# Patient Record
Sex: Male | Born: 1965 | Race: White | Hispanic: No | Smoking: Never smoker
Health system: Southern US, Community
[De-identification: ages and names within clinical notes are randomized; demographics above are authoritative.]

## PROBLEM LIST (undated history)

## (undated) DIAGNOSIS — F419 Anxiety disorder, unspecified: Secondary | ICD-10-CM

## (undated) DIAGNOSIS — I4581 Long QT syndrome: Secondary | ICD-10-CM

## (undated) DIAGNOSIS — J019 Acute sinusitis, unspecified: Secondary | ICD-10-CM

## (undated) DIAGNOSIS — H6120 Impacted cerumen, unspecified ear: Secondary | ICD-10-CM

## (undated) DIAGNOSIS — F1910 Other psychoactive substance abuse, uncomplicated: Secondary | ICD-10-CM

## (undated) DIAGNOSIS — R253 Fasciculation: Secondary | ICD-10-CM

## (undated) DIAGNOSIS — R251 Tremor, unspecified: Secondary | ICD-10-CM

## (undated) DIAGNOSIS — Z950 Presence of cardiac pacemaker: Secondary | ICD-10-CM

## (undated) DIAGNOSIS — L0291 Cutaneous abscess, unspecified: Secondary | ICD-10-CM

## (undated) DIAGNOSIS — F4310 Post-traumatic stress disorder, unspecified: Secondary | ICD-10-CM

## (undated) DIAGNOSIS — F1120 Opioid dependence, uncomplicated: Secondary | ICD-10-CM

## (undated) HISTORY — DX: Post-traumatic stress disorder, unspecified: F43.10

## (undated) HISTORY — DX: Tremor, unspecified: R25.1

## (undated) HISTORY — DX: Fasciculation: R25.3

## (undated) HISTORY — DX: Acute sinusitis, unspecified: J01.90

## (undated) HISTORY — PX: HERNIA REPAIR: SHX51

## (undated) HISTORY — DX: Long QT syndrome: I45.81

## (undated) HISTORY — DX: Other psychoactive substance abuse, uncomplicated: F19.10

## (undated) HISTORY — DX: Anxiety disorder, unspecified: F41.9

## (undated) HISTORY — DX: Opioid dependence, uncomplicated: F11.20

## (undated) HISTORY — DX: Impacted cerumen, unspecified ear: H61.20

## (undated) HISTORY — PX: LIVER SURGERY: SHX698

## (undated) HISTORY — DX: Cutaneous abscess, unspecified: L02.91

## (undated) HISTORY — DX: Presence of cardiac pacemaker: Z95.0

---

## 2002-06-19 ENCOUNTER — Emergency Department (HOSPITAL_COMMUNITY): Admission: EM | Admit: 2002-06-19 | Discharge: 2002-06-19 | Payer: Self-pay | Admitting: Emergency Medicine

## 2003-11-12 ENCOUNTER — Emergency Department (HOSPITAL_COMMUNITY): Admission: EM | Admit: 2003-11-12 | Discharge: 2003-11-12 | Payer: Self-pay | Admitting: Emergency Medicine

## 2004-04-27 ENCOUNTER — Inpatient Hospital Stay (HOSPITAL_COMMUNITY): Admission: EM | Admit: 2004-04-27 | Discharge: 2004-04-30 | Payer: Self-pay | Admitting: Emergency Medicine

## 2005-04-18 ENCOUNTER — Emergency Department (HOSPITAL_COMMUNITY): Admission: EM | Admit: 2005-04-18 | Discharge: 2005-04-18 | Payer: Self-pay | Admitting: Emergency Medicine

## 2005-06-02 ENCOUNTER — Inpatient Hospital Stay (HOSPITAL_COMMUNITY): Admission: EM | Admit: 2005-06-02 | Discharge: 2005-06-05 | Payer: Self-pay | Admitting: *Deleted

## 2005-06-11 ENCOUNTER — Ambulatory Visit: Payer: Self-pay | Admitting: Family Medicine

## 2005-06-12 ENCOUNTER — Ambulatory Visit: Payer: Self-pay | Admitting: *Deleted

## 2005-07-01 ENCOUNTER — Ambulatory Visit: Payer: Self-pay | Admitting: Family Medicine

## 2005-07-02 ENCOUNTER — Ambulatory Visit (HOSPITAL_COMMUNITY): Admission: RE | Admit: 2005-07-02 | Discharge: 2005-07-02 | Payer: Self-pay | Admitting: Internal Medicine

## 2005-07-09 ENCOUNTER — Ambulatory Visit: Payer: Self-pay | Admitting: Family Medicine

## 2005-07-16 ENCOUNTER — Emergency Department (HOSPITAL_COMMUNITY): Admission: EM | Admit: 2005-07-16 | Discharge: 2005-07-16 | Payer: Self-pay | Admitting: Emergency Medicine

## 2005-08-20 ENCOUNTER — Ambulatory Visit: Payer: Self-pay | Admitting: Family Medicine

## 2005-09-16 ENCOUNTER — Ambulatory Visit: Payer: Self-pay | Admitting: Internal Medicine

## 2005-09-19 ENCOUNTER — Emergency Department (HOSPITAL_COMMUNITY): Admission: EM | Admit: 2005-09-19 | Discharge: 2005-09-19 | Payer: Self-pay | Admitting: Emergency Medicine

## 2005-09-25 ENCOUNTER — Emergency Department (HOSPITAL_COMMUNITY): Admission: EM | Admit: 2005-09-25 | Discharge: 2005-09-25 | Payer: Self-pay | Admitting: Emergency Medicine

## 2005-10-07 ENCOUNTER — Ambulatory Visit: Payer: Self-pay | Admitting: Family Medicine

## 2005-11-04 ENCOUNTER — Emergency Department (HOSPITAL_COMMUNITY): Admission: EM | Admit: 2005-11-04 | Discharge: 2005-11-04 | Payer: Self-pay | Admitting: Family Medicine

## 2005-12-15 ENCOUNTER — Ambulatory Visit: Payer: Self-pay | Admitting: Family Medicine

## 2005-12-17 ENCOUNTER — Emergency Department (HOSPITAL_COMMUNITY): Admission: EM | Admit: 2005-12-17 | Discharge: 2005-12-17 | Payer: Self-pay | Admitting: Family Medicine

## 2006-02-18 ENCOUNTER — Ambulatory Visit: Payer: Self-pay | Admitting: Family Medicine

## 2006-08-24 ENCOUNTER — Ambulatory Visit: Payer: Self-pay | Admitting: Internal Medicine

## 2006-09-02 ENCOUNTER — Emergency Department (HOSPITAL_COMMUNITY): Admission: EM | Admit: 2006-09-02 | Discharge: 2006-09-02 | Payer: Self-pay | Admitting: Emergency Medicine

## 2006-09-02 ENCOUNTER — Ambulatory Visit: Payer: Self-pay | Admitting: Vascular Surgery

## 2006-09-07 ENCOUNTER — Ambulatory Visit: Payer: Self-pay | Admitting: Internal Medicine

## 2006-09-08 ENCOUNTER — Ambulatory Visit: Payer: Self-pay | Admitting: Internal Medicine

## 2006-09-09 ENCOUNTER — Ambulatory Visit (HOSPITAL_COMMUNITY): Admission: RE | Admit: 2006-09-09 | Discharge: 2006-09-09 | Payer: Self-pay | Admitting: Internal Medicine

## 2006-09-21 ENCOUNTER — Ambulatory Visit: Payer: Self-pay | Admitting: Family Medicine

## 2006-09-28 ENCOUNTER — Ambulatory Visit: Payer: Self-pay | Admitting: Internal Medicine

## 2006-09-29 ENCOUNTER — Ambulatory Visit: Payer: Self-pay | Admitting: Family Medicine

## 2006-10-06 ENCOUNTER — Ambulatory Visit: Payer: Self-pay | Admitting: Internal Medicine

## 2006-10-14 ENCOUNTER — Ambulatory Visit: Payer: Self-pay | Admitting: Internal Medicine

## 2006-10-15 ENCOUNTER — Ambulatory Visit: Payer: Self-pay | Admitting: Family Medicine

## 2006-10-22 ENCOUNTER — Ambulatory Visit: Payer: Self-pay | Admitting: Internal Medicine

## 2006-10-27 ENCOUNTER — Ambulatory Visit: Payer: Self-pay | Admitting: Internal Medicine

## 2006-11-30 ENCOUNTER — Emergency Department (HOSPITAL_COMMUNITY): Admission: EM | Admit: 2006-11-30 | Discharge: 2006-11-30 | Payer: Self-pay | Admitting: Emergency Medicine

## 2006-12-09 ENCOUNTER — Encounter (INDEPENDENT_AMBULATORY_CARE_PROVIDER_SITE_OTHER): Payer: Self-pay | Admitting: *Deleted

## 2006-12-24 ENCOUNTER — Ambulatory Visit: Payer: Self-pay | Admitting: Family Medicine

## 2007-02-08 ENCOUNTER — Emergency Department (HOSPITAL_COMMUNITY): Admission: EM | Admit: 2007-02-08 | Discharge: 2007-02-08 | Payer: Self-pay | Admitting: Emergency Medicine

## 2007-02-09 ENCOUNTER — Inpatient Hospital Stay (HOSPITAL_COMMUNITY): Admission: EM | Admit: 2007-02-09 | Discharge: 2007-02-11 | Payer: Self-pay | Admitting: Emergency Medicine

## 2007-04-02 ENCOUNTER — Ambulatory Visit: Payer: Self-pay | Admitting: Family Medicine

## 2007-04-06 ENCOUNTER — Emergency Department (HOSPITAL_COMMUNITY): Admission: EM | Admit: 2007-04-06 | Discharge: 2007-04-06 | Payer: Self-pay | Admitting: *Deleted

## 2007-04-06 ENCOUNTER — Emergency Department (HOSPITAL_COMMUNITY): Admission: EM | Admit: 2007-04-06 | Discharge: 2007-04-06 | Payer: Self-pay | Admitting: Family Medicine

## 2007-04-08 ENCOUNTER — Emergency Department (HOSPITAL_COMMUNITY): Admission: EM | Admit: 2007-04-08 | Discharge: 2007-04-08 | Payer: Self-pay | Admitting: Emergency Medicine

## 2007-04-12 ENCOUNTER — Emergency Department (HOSPITAL_COMMUNITY): Admission: EM | Admit: 2007-04-12 | Discharge: 2007-04-12 | Payer: Self-pay | Admitting: Emergency Medicine

## 2007-05-11 ENCOUNTER — Ambulatory Visit: Payer: Self-pay | Admitting: Family Medicine

## 2007-06-11 ENCOUNTER — Ambulatory Visit: Payer: Self-pay | Admitting: Family Medicine

## 2007-06-12 ENCOUNTER — Emergency Department (HOSPITAL_COMMUNITY): Admission: EM | Admit: 2007-06-12 | Discharge: 2007-06-12 | Payer: Self-pay | Admitting: Family Medicine

## 2007-06-12 ENCOUNTER — Emergency Department (HOSPITAL_COMMUNITY): Admission: EM | Admit: 2007-06-12 | Discharge: 2007-06-12 | Payer: Self-pay | Admitting: Emergency Medicine

## 2007-07-15 ENCOUNTER — Ambulatory Visit: Payer: Self-pay | Admitting: Internal Medicine

## 2007-09-17 ENCOUNTER — Ambulatory Visit: Payer: Self-pay | Admitting: Family Medicine

## 2007-09-18 ENCOUNTER — Emergency Department (HOSPITAL_COMMUNITY): Admission: EM | Admit: 2007-09-18 | Discharge: 2007-09-18 | Payer: Self-pay | Admitting: Emergency Medicine

## 2007-10-01 ENCOUNTER — Emergency Department (HOSPITAL_COMMUNITY): Admission: EM | Admit: 2007-10-01 | Discharge: 2007-10-01 | Payer: Self-pay | Admitting: Emergency Medicine

## 2007-10-27 ENCOUNTER — Encounter (INDEPENDENT_AMBULATORY_CARE_PROVIDER_SITE_OTHER): Payer: Self-pay | Admitting: Family Medicine

## 2007-10-27 ENCOUNTER — Ambulatory Visit: Payer: Self-pay | Admitting: Family Medicine

## 2007-10-27 LAB — CONVERTED CEMR LAB
Albumin: 4.4 g/dL (ref 3.5–5.2)
Alkaline Phosphatase: 57 units/L (ref 39–117)
BUN: 10 mg/dL (ref 6–23)
Glucose, Bld: 91 mg/dL (ref 70–99)
HDL: 40 mg/dL (ref >39)
LDL Cholesterol: 126 mg/dL — ABNORMAL HIGH (ref 0–99)
Total Bilirubin: 0.7 mg/dL (ref 0.3–1.2)
Triglycerides: 97 mg/dL (ref <150)
VLDL: 19 mg/dL (ref 0–40)

## 2007-11-16 ENCOUNTER — Ambulatory Visit: Payer: Self-pay | Admitting: Internal Medicine

## 2008-01-10 IMAGING — CR DG ANKLE COMPLETE 3+V*L*
3 series · 3 of 3 positions shown · non-contrast
Comparison: none

CLINICAL DATA: Swelling ankle and foot for two days. 
 LEFT ANKLE - 3 VIEW ? 04/18/05:

[view not recorded (1 of 3)]
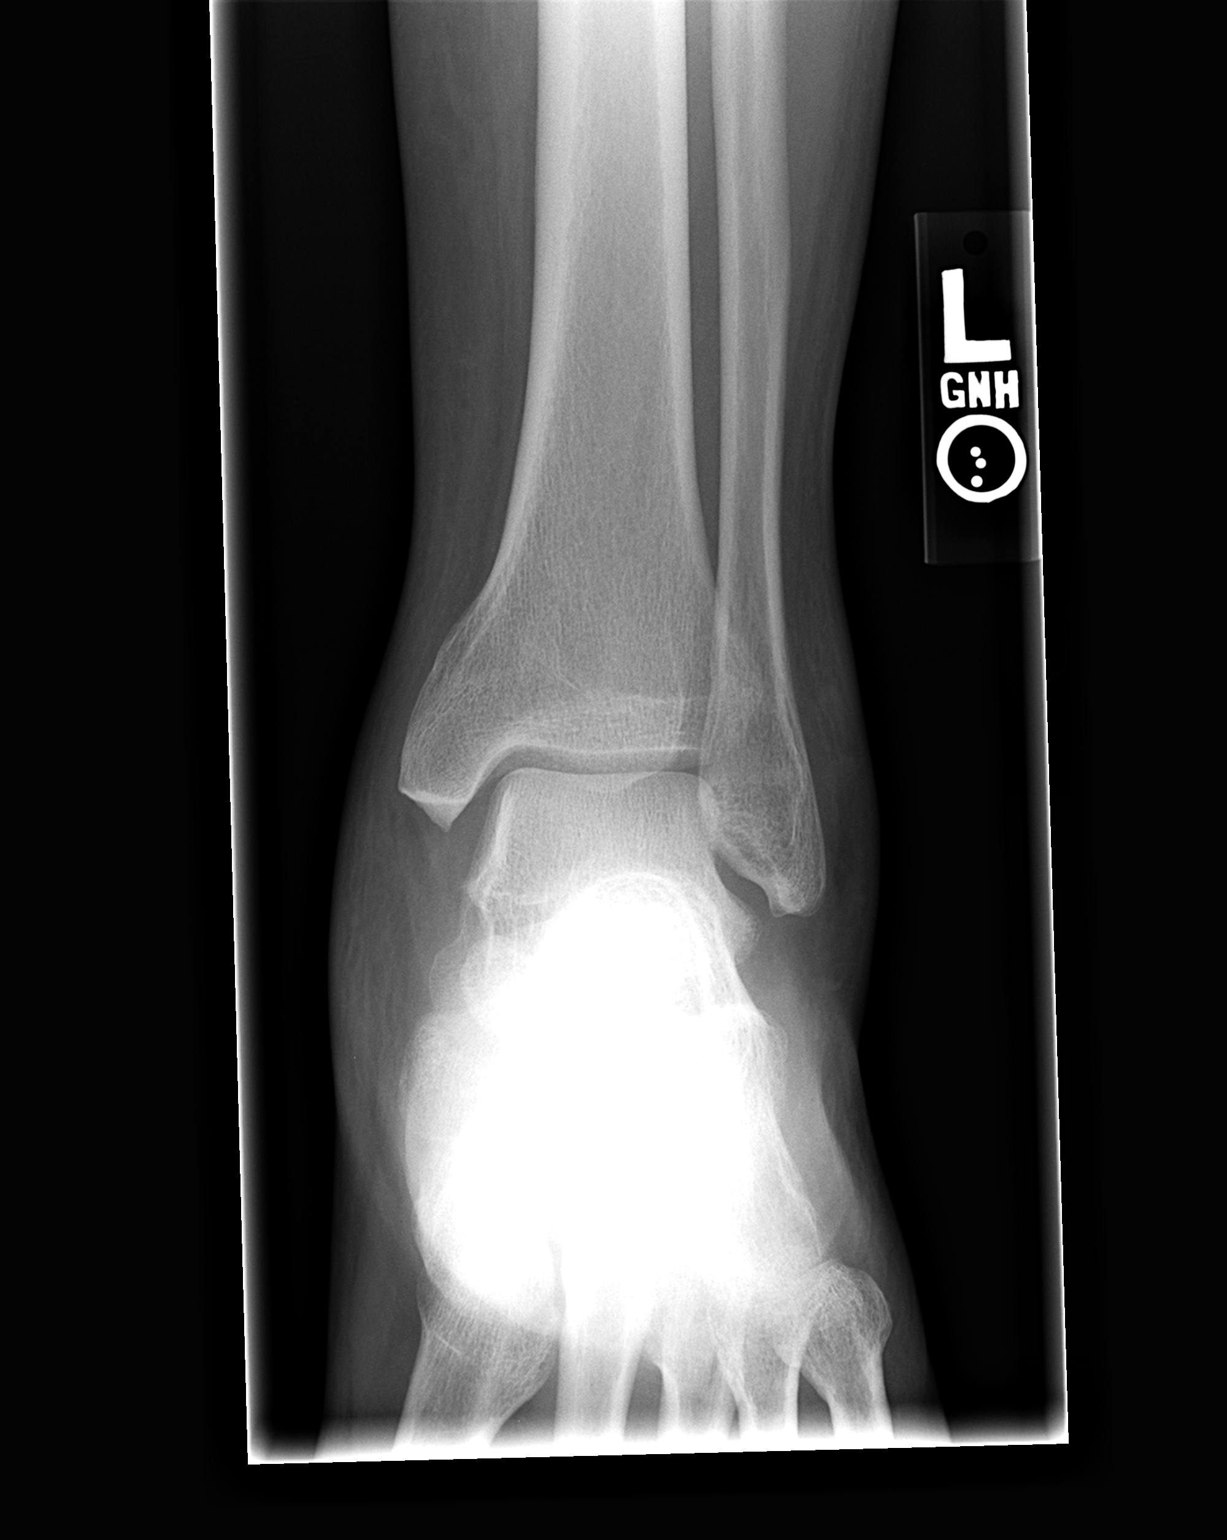

[view not recorded (2 of 3)]
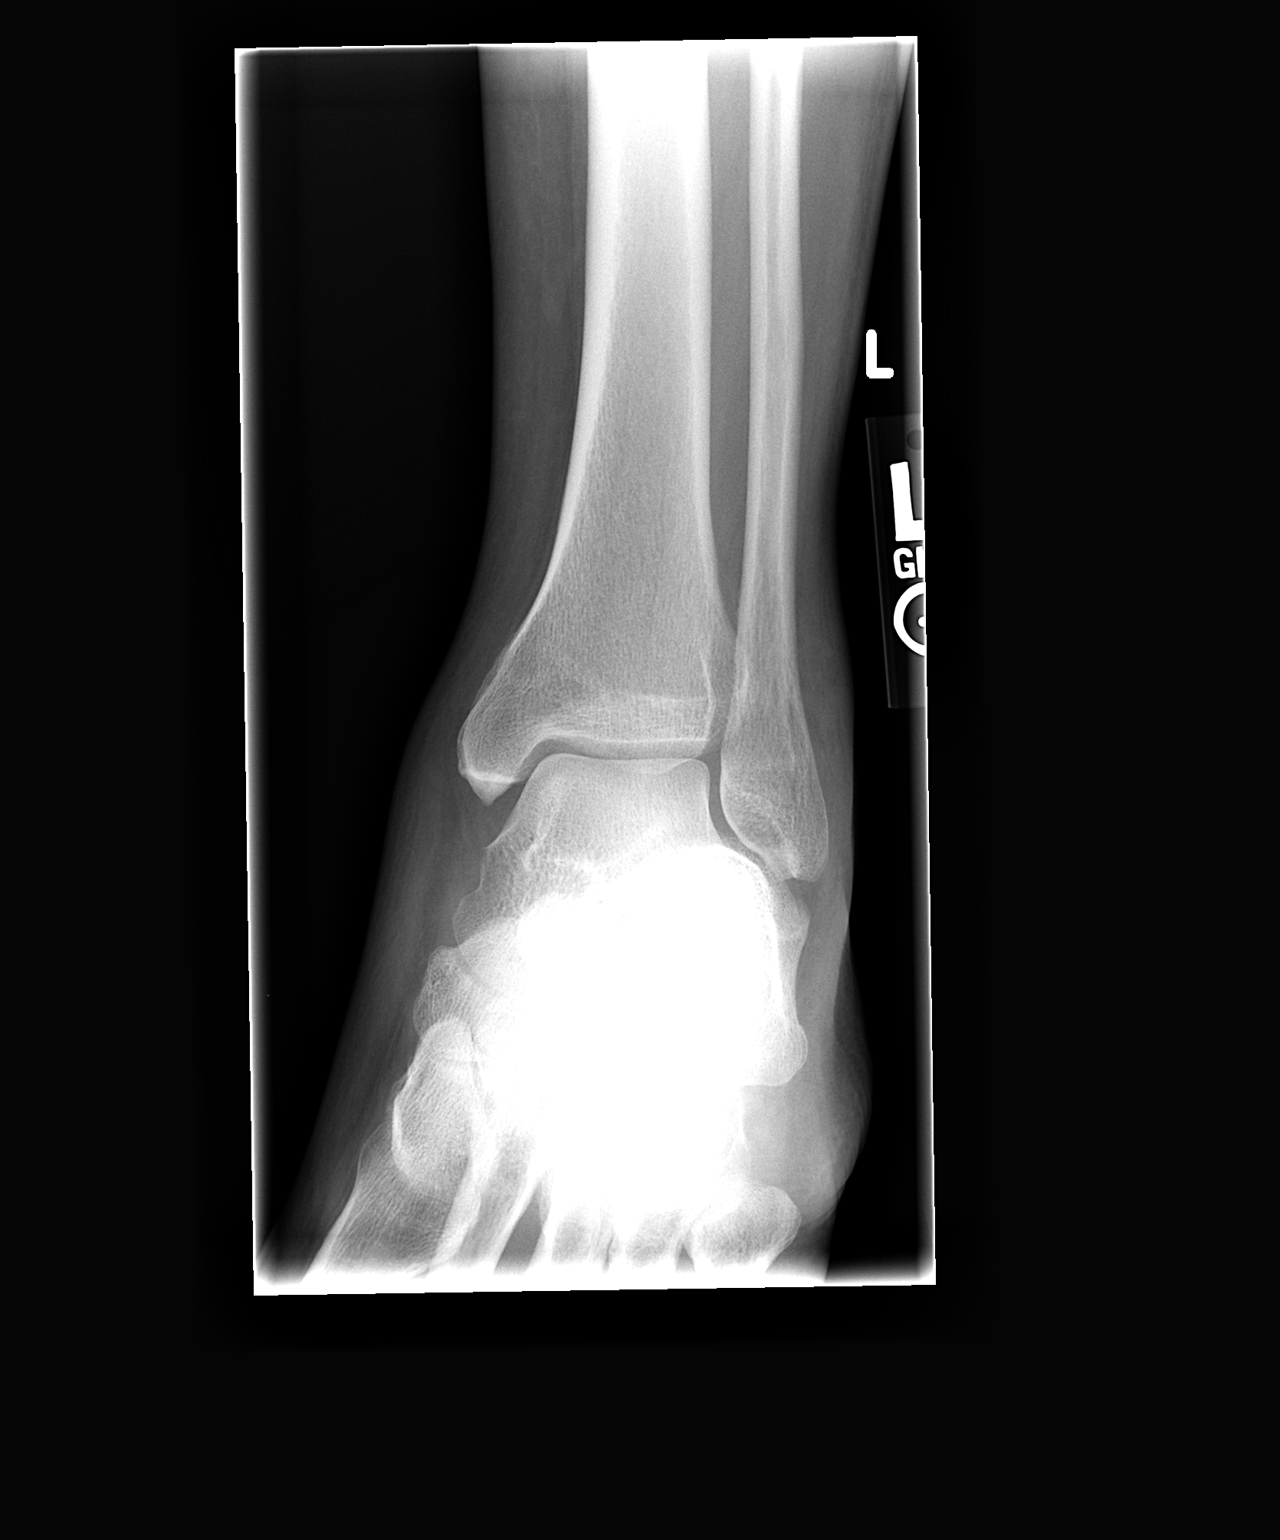

[view not recorded (3 of 3)]
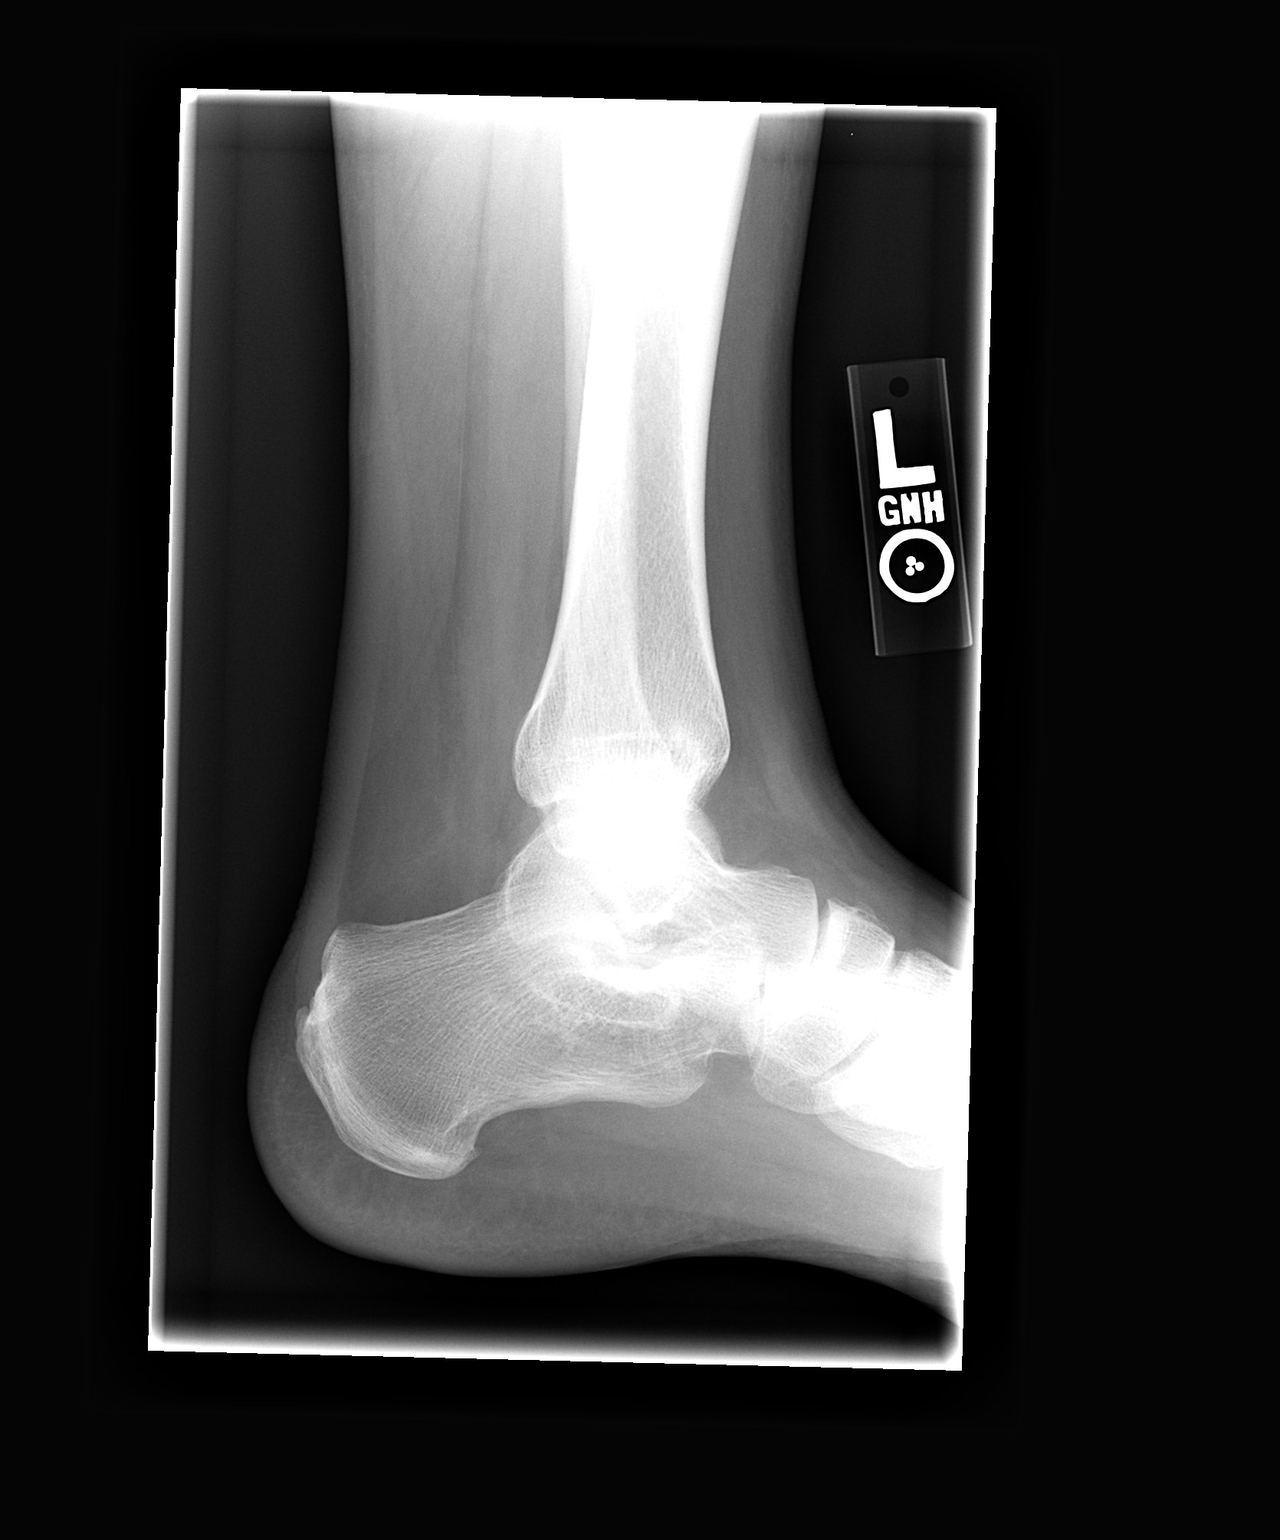

[3 of 3 positions shown; findings below may reference images not displayed]

FINDINGS: Diffuse ankle soft tissue swelling is seen.   The ankle mortise is symmetric with no fracture nor arthritis seen.   Tiny degenerative spurs are seen at the anterior talus and navicular and at the posterior and inferior calcaneus.
IMPRESSION: 1.   Nonspecific ankle soft tissue swelling.
 2.   Minimal degenerative osteophytes as described. 
 3.   Otherwise negative.

## 2008-01-12 ENCOUNTER — Ambulatory Visit: Payer: Self-pay | Admitting: Internal Medicine

## 2008-01-13 ENCOUNTER — Ambulatory Visit (HOSPITAL_COMMUNITY): Admission: RE | Admit: 2008-01-13 | Discharge: 2008-01-13 | Payer: Self-pay | Admitting: Internal Medicine

## 2008-06-06 ENCOUNTER — Ambulatory Visit: Payer: Self-pay | Admitting: Internal Medicine

## 2008-06-16 ENCOUNTER — Ambulatory Visit: Payer: Self-pay | Admitting: Internal Medicine

## 2008-06-16 ENCOUNTER — Encounter (INDEPENDENT_AMBULATORY_CARE_PROVIDER_SITE_OTHER): Payer: Self-pay | Admitting: Adult Health

## 2008-06-16 LAB — CONVERTED CEMR LAB
Alkaline Phosphatase: 70 units/L (ref 39–117)
BUN: 11 mg/dL (ref 6–23)
Basophils Absolute: 0 10*3/uL (ref 0.0–0.1)
Basophils Relative: 0 % (ref 0–1)
Creatinine, Ser: 1.13 mg/dL (ref 0.40–1.50)
Glucose, Bld: 74 mg/dL (ref 70–99)
HDL: 30 mg/dL — ABNORMAL LOW (ref >39)
Hemoglobin: 13.8 g/dL (ref 13.0–17.0)
LDL Cholesterol: 103 mg/dL — ABNORMAL HIGH (ref 0–99)
MCHC: 33 g/dL (ref 30.0–36.0)
Monocytes Absolute: 0.7 10*3/uL (ref 0.1–1.0)
Neutro Abs: 6 10*3/uL (ref 1.7–7.7)
RDW: 13.2 % (ref 11.5–15.5)
Sodium: 140 meq/L (ref 135–145)
Total Bilirubin: 0.6 mg/dL (ref 0.3–1.2)
Total CHOL/HDL Ratio: 5.7
Total Protein: 7.8 g/dL (ref 6.0–8.3)
Triglycerides: 189 mg/dL — ABNORMAL HIGH (ref <150)
VLDL: 38 mg/dL (ref 0–40)

## 2008-12-13 ENCOUNTER — Emergency Department (HOSPITAL_COMMUNITY): Admission: EM | Admit: 2008-12-13 | Discharge: 2008-12-13 | Payer: Self-pay | Admitting: Family Medicine

## 2008-12-13 ENCOUNTER — Ambulatory Visit: Payer: Self-pay | Admitting: Internal Medicine

## 2008-12-13 ENCOUNTER — Observation Stay (HOSPITAL_COMMUNITY): Admission: EM | Admit: 2008-12-13 | Discharge: 2008-12-16 | Payer: Self-pay | Admitting: Emergency Medicine

## 2008-12-13 ENCOUNTER — Ambulatory Visit: Payer: Self-pay | Admitting: Vascular Surgery

## 2008-12-13 ENCOUNTER — Encounter (INDEPENDENT_AMBULATORY_CARE_PROVIDER_SITE_OTHER): Payer: Self-pay | Admitting: Emergency Medicine

## 2008-12-14 ENCOUNTER — Encounter: Payer: Self-pay | Admitting: Internal Medicine

## 2009-06-06 ENCOUNTER — Ambulatory Visit: Payer: Self-pay | Admitting: Family Medicine

## 2009-06-06 ENCOUNTER — Encounter (INDEPENDENT_AMBULATORY_CARE_PROVIDER_SITE_OTHER): Payer: Self-pay | Admitting: Adult Health

## 2009-06-06 LAB — CONVERTED CEMR LAB
ALT: 28 units/L (ref 0–53)
AST: 28 units/L (ref 0–37)
BUN: 11 mg/dL (ref 6–23)
Basophils Relative: 0 % (ref 0–1)
Cholesterol: 191 mg/dL (ref 0–200)
Creatinine, Ser: 1.08 mg/dL (ref 0.40–1.50)
Eosinophils Absolute: 0 10*3/uL (ref 0.0–0.7)
Eosinophils Relative: 0 % (ref 0–5)
HDL: 29 mg/dL — ABNORMAL LOW (ref >39)
MCHC: 32.7 g/dL (ref 30.0–36.0)
MCV: 86.7 fL (ref 78.0–100.0)
Neutrophils Relative %: 70 % (ref 43–77)
Platelets: 229 10*3/uL (ref 150–400)
RDW: 13.2 % (ref 11.5–15.5)
Total Bilirubin: 0.7 mg/dL (ref 0.3–1.2)
Total CHOL/HDL Ratio: 6.6
VLDL: 36 mg/dL (ref 0–40)

## 2009-06-27 ENCOUNTER — Ambulatory Visit: Payer: Self-pay | Admitting: Internal Medicine

## 2009-09-16 ENCOUNTER — Emergency Department (HOSPITAL_COMMUNITY): Admission: EM | Admit: 2009-09-16 | Discharge: 2009-09-16 | Payer: Self-pay | Admitting: Family Medicine

## 2009-09-28 ENCOUNTER — Encounter (INDEPENDENT_AMBULATORY_CARE_PROVIDER_SITE_OTHER): Payer: Self-pay | Admitting: Adult Health

## 2009-09-28 ENCOUNTER — Ambulatory Visit: Payer: Self-pay | Admitting: Internal Medicine

## 2009-09-28 LAB — CONVERTED CEMR LAB
ALT: 24 units/L (ref 0–53)
AST: 27 units/L (ref 0–37)
Alkaline Phosphatase: 66 units/L (ref 39–117)
Amylase: 23 units/L (ref 0–105)
BUN: 16 mg/dL (ref 6–23)
Basophils Absolute: 0 10*3/uL (ref 0.0–0.1)
Basophils Relative: 0 % (ref 0–1)
Calcium: 8.8 mg/dL (ref 8.4–10.5)
Chloride: 102 meq/L (ref 96–112)
Creatinine, Ser: 1 mg/dL (ref 0.40–1.50)
Eosinophils Absolute: 0.1 10*3/uL (ref 0.0–0.7)
Eosinophils Relative: 1 % (ref 0–5)
HCT: 40 % (ref 39.0–52.0)
HDL: 32 mg/dL — ABNORMAL LOW (ref >39)
LDL Cholesterol: 92 mg/dL (ref 0–99)
Lipase: 10 units/L (ref 0–75)
Lymphs Abs: 2.1 10*3/uL (ref 0.7–4.0)
MCV: 88.3 fL (ref 78.0–100.0)
Neutrophils Relative %: 69 % (ref 43–77)
Platelets: 218 10*3/uL (ref 150–400)
RDW: 14.1 % (ref 11.5–15.5)
Total Bilirubin: 0.5 mg/dL (ref 0.3–1.2)
Total CHOL/HDL Ratio: 5.3
VLDL: 44 mg/dL — ABNORMAL HIGH (ref 0–40)
WBC: 8.4 10*3/uL (ref 4.0–10.5)

## 2009-10-15 ENCOUNTER — Ambulatory Visit: Payer: Self-pay | Admitting: Internal Medicine

## 2009-10-15 ENCOUNTER — Encounter (INDEPENDENT_AMBULATORY_CARE_PROVIDER_SITE_OTHER): Payer: Self-pay | Admitting: Adult Health

## 2009-10-17 ENCOUNTER — Ambulatory Visit (HOSPITAL_COMMUNITY): Admission: RE | Admit: 2009-10-17 | Discharge: 2009-10-17 | Payer: Self-pay | Admitting: Internal Medicine

## 2009-12-17 ENCOUNTER — Emergency Department (HOSPITAL_COMMUNITY): Admission: EM | Admit: 2009-12-17 | Discharge: 2009-12-17 | Payer: Self-pay | Admitting: Family Medicine

## 2009-12-24 ENCOUNTER — Emergency Department (HOSPITAL_COMMUNITY): Admission: EM | Admit: 2009-12-24 | Discharge: 2009-12-24 | Payer: Self-pay | Admitting: Emergency Medicine

## 2010-06-05 LAB — RAPID URINE DRUG SCREEN, HOSP PERFORMED
Amphetamines: NOT DETECTED
Opiates: NOT DETECTED
Tetrahydrocannabinol: NOT DETECTED

## 2010-06-11 IMAGING — CR DG CHEST 1V PORT
1 series · 1 of 1 positions shown · non-contrast
Comparison: 06/12/2007

CLINICAL DATA: Chest pain

PORTABLE CHEST - 1 VIEW

[AP]
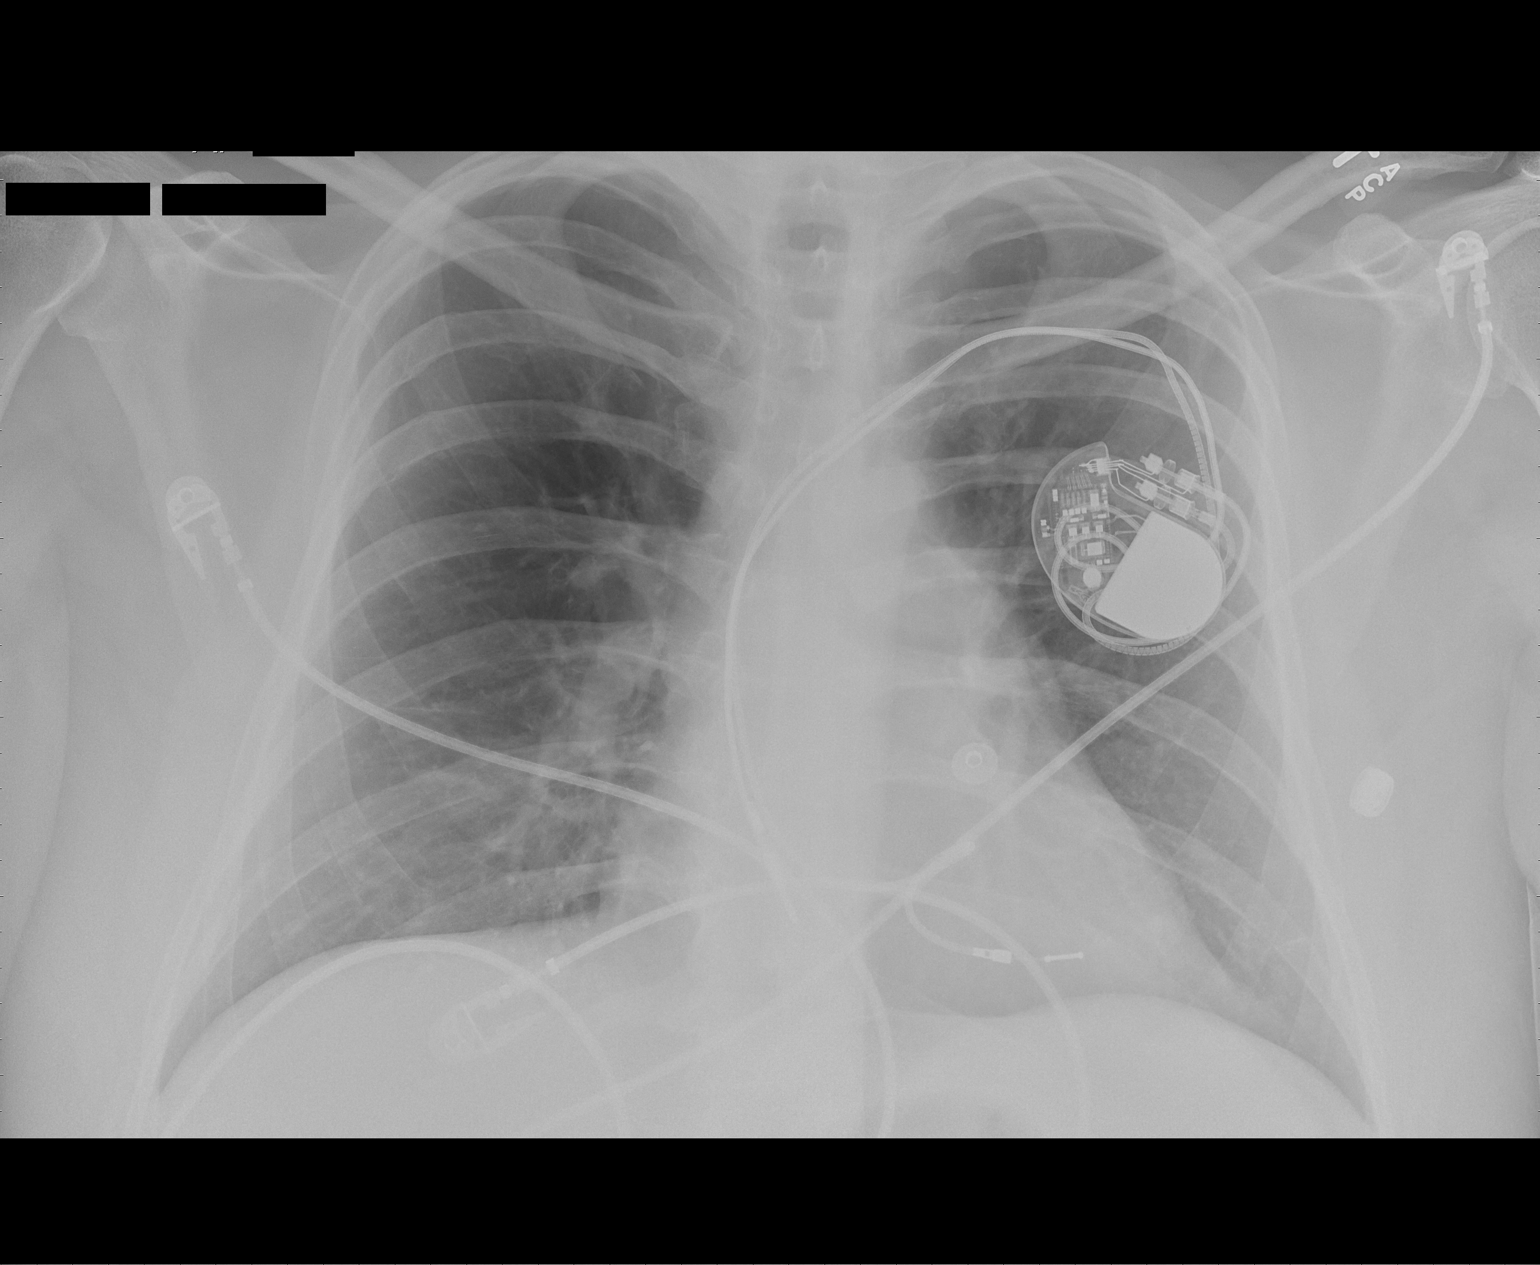

[1 of 1 positions shown; findings below may reference images not displayed]

FINDINGS: Left-sided pacer remains in place, unchanged.  Heart is
upper limits normal in size.  No focal opacities or effusions.  No
acute bony abnormality.
IMPRESSION: No acute findings.

## 2010-06-28 ENCOUNTER — Emergency Department: Payer: Self-pay | Admitting: Emergency Medicine

## 2010-06-28 LAB — COMPREHENSIVE METABOLIC PANEL
BUN: 8 mg/dL (ref 6–23)
CO2: 28 mEq/L (ref 19–32)
Calcium: 8.7 mg/dL (ref 8.4–10.5)
Creatinine, Ser: 1.02 mg/dL (ref 0.4–1.5)
GFR calc non Af Amer: >60 mL/min (ref >60)
Glucose, Bld: 113 mg/dL — ABNORMAL HIGH (ref 70–99)

## 2010-06-28 LAB — CBC
Hemoglobin: 12.7 g/dL — ABNORMAL LOW (ref 13.0–17.0)
MCHC: 33.9 g/dL (ref 30.0–36.0)
RBC: 4.32 MIL/uL (ref 4.22–5.81)

## 2010-06-28 LAB — BRAIN NATRIURETIC PEPTIDE: Pro B Natriuretic peptide (BNP): 39 pg/mL (ref 0.0–100.0)

## 2010-06-28 LAB — CARDIAC PANEL(CRET KIN+CKTOT+MB+TROPI)
CK, MB: 1.7 ng/mL (ref 0.3–4.0)
Relative Index: 1.2 (ref 0.0–2.5)
Total CK: 107 U/L (ref 7–232)
Troponin I: 0.01 ng/mL (ref 0.00–0.06)

## 2010-06-28 LAB — RAPID URINE DRUG SCREEN, HOSP PERFORMED
Barbiturates: NOT DETECTED
Benzodiazepines: NOT DETECTED
Benzodiazepines: NOT DETECTED
Cocaine: NOT DETECTED
Cocaine: NOT DETECTED
Opiates: NOT DETECTED
Opiates: NOT DETECTED

## 2010-06-28 LAB — TROPONIN I: Troponin I: 0.02 ng/mL (ref 0.00–0.06)

## 2010-06-28 LAB — CK TOTAL AND CKMB (NOT AT ARMC): Relative Index: INVALID (ref 0.0–2.5)

## 2010-06-28 LAB — IRON AND TIBC
Iron: 61 ug/dL (ref 42–135)
Saturation Ratios: 24 % (ref 20–55)
TIBC: 258 ug/dL (ref 215–435)
UIBC: 197 ug/dL

## 2010-06-28 LAB — HEPATIC FUNCTION PANEL
ALT: 29 U/L (ref 0–53)
AST: 28 U/L (ref 0–37)
Albumin: 3.4 g/dL — ABNORMAL LOW (ref 3.5–5.2)
Alkaline Phosphatase: 63 U/L (ref 39–117)
Total Bilirubin: 0.8 mg/dL (ref 0.3–1.2)

## 2010-06-28 LAB — PROTIME-INR
INR: 1 (ref 0.00–1.49)
Prothrombin Time: 12.6 seconds (ref 11.6–15.2)

## 2010-06-28 LAB — URINALYSIS, ROUTINE W REFLEX MICROSCOPIC
Glucose, UA: NEGATIVE mg/dL
Ketones, ur: NEGATIVE mg/dL
Specific Gravity, Urine: 1.024 (ref 1.005–1.030)
pH: 5 (ref 5.0–8.0)

## 2010-06-28 LAB — DIFFERENTIAL
Basophils Absolute: 0.1 10*3/uL (ref 0.0–0.1)
Eosinophils Absolute: 0.1 10*3/uL (ref 0.0–0.7)
Lymphocytes Relative: 23 % (ref 12–46)
Lymphs Abs: 1.8 10*3/uL (ref 0.7–4.0)
Neutrophils Relative %: 71 % (ref 43–77)

## 2010-06-28 LAB — CK: Total CK: 81 U/L (ref 7–232)

## 2010-06-28 LAB — TSH: TSH: 2.625 u[IU]/mL (ref 0.350–4.500)

## 2010-06-28 LAB — LIPID PANEL
Cholesterol: 156 mg/dL (ref 0–200)
HDL: 24 mg/dL — ABNORMAL LOW (ref >39)
Total CHOL/HDL Ratio: 6.5 RATIO

## 2010-06-28 LAB — RETICULOCYTES: Retic Count, Absolute: 87.8 10*3/uL (ref 19.0–186.0)

## 2010-06-28 LAB — HIV ANTIBODY (ROUTINE TESTING W REFLEX): HIV: NONREACTIVE

## 2010-06-28 LAB — POCT CARDIAC MARKERS

## 2010-06-28 LAB — SEDIMENTATION RATE: Sed Rate: 41 mm/hr — ABNORMAL HIGH (ref 0–16)

## 2010-06-28 LAB — FERRITIN: Ferritin: 280 ng/mL (ref 22–322)

## 2010-07-16 ENCOUNTER — Emergency Department (HOSPITAL_COMMUNITY)
Admission: EM | Admit: 2010-07-16 | Discharge: 2010-07-16 | Disposition: A | Discharge status: Home / Self Care | Payer: Self-pay | Attending: Emergency Medicine | Admitting: Emergency Medicine

## 2010-07-16 DIAGNOSIS — I10 Essential (primary) hypertension: Secondary | ICD-10-CM | POA: Insufficient documentation

## 2010-07-16 DIAGNOSIS — Z950 Presence of cardiac pacemaker: Secondary | ICD-10-CM | POA: Insufficient documentation

## 2010-07-16 DIAGNOSIS — F411 Generalized anxiety disorder: Secondary | ICD-10-CM | POA: Insufficient documentation

## 2010-08-06 NOTE — Consult Note (Signed)
Jeffrey [name]             ACCOUNT NO.:  [id number]   MEDICAL RECORD NO.:  [id number]          PATIENT TYPE:  INP   LOCATION:  1514                         FACILITY:  [facility]   PHYSICIAN:  Antonietta Breach, M.D.  DATE OF BIRTH:  [birth year]   DATE OF CONSULTATION:  02/10/2007  DATE OF DISCHARGE:                                 CONSULTATION   REASON FOR CONSULTATION:  Overdose.   REQUESTING PHYSICIAN:  [facility].   HISTORY OF PRESENT ILLNESS:  [name] is a 45 year old male  admitted to the [facility] on February 09, 2007, due to  complications of an overdose.   The patient had by report of family taken 90 tablets of Klonopin over 3  days.  This was based upon the bottle and the refill history.  The  patient has been a regular methadone client.  He has been placed on a  methadone program in a clinic downtown as a way to prevent his pattern  of engaging in illegal opiate dependent pattern of morphine sulfate and  Dilaudid.   The patient has very poor memory for the past 4 days.  He describes no  suicidal thoughts and denies any knowledge of trying to harm himself  with drug ingestion or otherwise.   He describes constructive future goals and interests.  He is now  oriented to all spheres.  He is cooperative with bedside care.  He has  no hallucinations or delusions.  He has no thoughts of harming himself  or others.  He is motivated to become abstinent from benzodiazepines, to  continue to maintain sobriety.  However, he does want to maintain the  methadone maintenance program because of its efficacy in controlling his  opioid dependent pattern.   Please see the discussion below regarding the patient's amnestic period.   PAST PSYCHIATRIC HISTORY:  The patient has not consumed alcohol in many  years.  He reports having been taking only 0.5 mg of Klonopin b.i.d. up  until the point of the 90 tablet overdose.  However, this average daily  use  cannot be confirmed.   The patient states that he has been in 14, substance dependent  rehabilitation programs.  She has not been on any antidepressants.   For general medical history, please see the attending physician's report  and progress notes.   MENTAL STATUS EXAM:  [name] is alert.  He is oriented to all  spheres.  He is socially appropriate and cooperative.  His memory is  intact to immediate recent and remote except for the amnestic period  over the past 4 days.  His speech is normal.  Thought process logical,  coherent, goal-directed.  No looseness of associations.  Thought content  with no thoughts of harming himself.  No thoughts of harming others no  delusions, no hallucinations.  Affect is slightly flat at baseline, but  with a broad appropriate response.  Judgment is intact.   PHYSICAL EXAMINATION:  The patient is not demonstrating any tremors or  sweats.   ASSESSMENT:  AXIS I:  1.  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  1. Opioid dependence.  2. Rule out (293.84) anxiety disorder, not otherwise specified.  3. (293.00) Delirium, not otherwise specified.  It appears that the      patient took Klonopin on top of his methadone initially in a      pattern that was excessive of that prescribed, but was sufficient      enough to place him into an anterograde amnesia.  At that point, he      apparently continued to take progressive amounts of Klonopin over 3      days, resulting in a potentially lethal overdose without conscious      intent.  The realization of this is helping motivate the patient      towards benzodiazepine abstinence and avoiding any other      medications that could synergize with his methadone in reducing his      delirium threshold.  AXIS II:  Deferred.  AXIS III:  See the primary care physician's general medical reports.  AXIS IV:  Primary support group.  AXIS V:  55.   Jeffrey [name] is  not at risk to harm himself or others.  He agrees to  call emergency services immediately for thoughts of harming himself,  thoughts of harming others or distress.   The undersigned provided ego supportive psychotherapy and education  reinforcing 12-step principles.   RECOMMENDATIONS:  1. The the patient does decline an inpatient chemical dependency      program, although the undersigned does recommend one.  He states      that he wants to get back to his outpatient 12-step approach and      ADS as well as his methadone maintenance program.  Would ask the      social worker to set this patient back up with [facility] as soon as      possible, once he is medically cleared.  2. Regarding his acute physiologic state, would use the CIWA      monitoring system (although traditionally for alcohol, this scale      will allow monitoring potential breakthrough benzodiazepine      withdrawal as well.  If the patient has any benzodiazepine      withdrawal, the Ativan is available p.r.n.  If the withdrawal      presents, I would start the Ativan detox protocol resulting in the      patient being discharged abstinent from benzodiazepines.      Antonietta Breach, M.D.  Electronically Signed     JW/MEDQ  D:  02/12/2007  T:  02/13/2007  Job:  [id number]

## 2010-08-06 NOTE — Discharge Summary (Signed)
Jeffrey [name], Jeffrey [name]             ACCOUNT NO.:  [id number]   MEDICAL RECORD NO.:  [id number]          PATIENT TYPE:  INP   LOCATION:  1514                         FACILITY:  [facility]   PHYSICIAN:  Elliot Cousin, M.D.    DATE OF BIRTH:  25-Oct-1965   DATE OF ADMISSION:  02/09/2007  DATE OF DISCHARGE:  02/11/2007                               DISCHARGE SUMMARY   DISCHARGE DIAGNOSES:  1. Delirium secondary to benzodiazepine (Klonopin) toxicity.  2. Antegrade amnesia secondary to Klonopin in combination with      methadone per the assessment of psychiatrist, Dr. Jeanie Sewer.  3. Opioid dependence, on chronic [facility].  4. Benzodiazepine abuse/addiction.  5. Hypertension.  6. 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  7. Hypokalemia.  8. Post traumatic stress disorder.   DISCHARGE MEDICATIONS:  1. Methadone 150 mg daily through the [facility]      program.  2. Klonopin 0.5 mg nightly or as directed by Dr. Emeline Darling and/or Dr.      Betti Cruz.  3. Atenolol 100 mg daily.  4. Lisinopril/hydrochlorothiazide 10/25 mg half a tablet daily for one      week and then back to one tablet daily or as directed.  5. Potassium chloride 20 mEq daily for seven more days.   DISCHARGE DISPOSITION:  The patient was discharged to home in improved  and stable condition on February 11, 2007.  He was advised to follow up  with [facility] daily as scheduled and Dr. Betti Cruz, his psychiatrist, in one  week.  The patient was also advised to follow up with his primary care  physician, Dr. Emeline Darling in one week.   PROCEDURE PERFORMED:  Chest x-ray on February 08, 2007.  The results  revealed no acute cardiopulmonary process.  Central bronchiectatic  changes, mild.   CONSULTATION:  Antonietta Breach, M.D.   HISTORY OF PRESENT ILLNESS:  The patient is a 45 year old man with a  past medical history significant for opioid dependence, post-traumatic  stress disorder,  chronic benzodiazepine treatment, and prolonged QT  interval.  He presented to the emergency department on February 09, 2007, after his nurse and/or family member found him to be confused and  delirious.  When Dr. Roxan Hockey evaluated the patient in the emergency  department, he was somewhat confused and unable to give a history that  made any sense.  However, per the conversation with the patient's  father, the patient had apparently taken 72 Klonopin tablets over the  past three days leading up to the hospital admission.  According to the  [facility] nurse, the patient had also taken 300 mg of methadone in one day  rather than 150 mg once daily for two days.  The patient was evaluated  by the ACT team in the emergency department and they apparently felt  that the patient needed medical clearance before  psychiatric inpatient  referral.  There was a question regarding whether or not the patient  attempted suicide.  However, the patient adamantly denied any thoughts  of suicide.  The patient  was therefore admitted for further evaluation  and management.   For additional details, please see the dictated history and physical.   HOSPITAL COURSE:  PROBLEM #1 - DELIRIUM AND ANTEGRADE AMNESIA SECONDARY  TO BENZODIAZEPINE TOXICITY/OVERDOSE:  At the time of the initial  assessment, the patient was hemodynamically stable and afebrile.  As  indicated above, he was somewhat confused and demonstrated mild  delirium.  His urine drug screen was positive for benzodiazepines only.  During the hospital course, he was monitored closely.  He remained  hemodynamically stable during the hospitalization.  The Klonopin was  discontinued during the first 24 hours of hospitalization, however,  because of a concern about a potential withdrawal syndrome, a small dose  of Klonopin was restarted at 0.5 mg b.i.d.  Subsequently, psychiatrist,  Dr. Jeanie Sewer, was consulted.  Per his assessment, the patient had  delirium and  antegrade amnesia secondary to Klonopin in combination with  methadone which led to a vicious cycle of craving and consumption  without memory of what the patient had consumed previously.  At the time  of Dr. Providence Crosby assessment, he felt that the patient was cleared for  discharge from a psychiatric standpoint.  He recommended discontinuing  Klonopin and starting the patient on as-needed Ativan.  However, the  patient indicated that he still had Klonopin at home and he vowed to  take only 0.5 mg nightly as prescribed previously by Dr. Emeline Darling.   Of note, the patient admitted that he received Klonopin from another  physician at a local urgent care even after Dr. Emeline Darling had prescribed 0.5  mg of Klonopin once a day.  The patient said that he began to crave more  benzodiazepines and therefore sought a prescription from an outside  physician.  Apparently he received the Klonopin and proceeded to take 90  Klonopin tablets over the three days leading up to the hospital  admission.  On the day of hospital discharge, the patient was completely  alert and oriented and without any sequelae from the Klonopin  intoxication.  It was felt that the patient was not suicidal and did not  take the Klonopin in an attempt to harm himself.   PROBLEM #2 - OPIOID DEPENDENCE (ON CHRONIC METHADONE):  The patient has  a remote history of heroin abuse.  He is being treated with methadone  150 mg daily via the [facility] program.  The patient acknowledged that he goes  to [facility] daily for methadone.  Dr. Betti Cruz is the psychiatrist who oversees  the methadone administration.  The patient received 75 mg of methadone  yesterday in the hospital and has received 150 mg today prior to  hospital discharge.  The registered nurse at [facility] was informed that the  patient received methadone today prior to hospital discharge.   PROBLEM #3 -  HYPERTENSION:  The patient's blood pressure remained  stable during the hospital course, but was on  the low end of normal.  He  is chronically treated with atenolol and lisinopril/hydrochlorothiazide.  The patient was advised to take only half of the  lisinopril/hydrochlorothiazide for approximately one week and then  restart one pill daily or as directed by Dr. Emeline Darling.  The patient was  instructed to maintain atenolol 100 mg daily.   PROBLEM #4 -  HISTORY OF PROLONGED QT INTERVAL STATUS POST PACEMAKER:  The patient was assessed with an EKG during the hospital course.  The  EKG revealed electronic atrial pacemaker, low voltage QRS, and a heart  rate  of 74 beats per minute.  There was no evidence of a prolonged QT  interval on the EKG.   PROBLEM #5 -  HYPOKALEMIA:  The patient's serum potassium ranged from  3.2-3.3 during the hospitalization.  He was started on supplementation.  His magnesium level was assessed and found to be within normal limits at  2.2.  The patient will be discharged to home on seven more days of  potassium chloride.      Elliot Cousin, M.D.  Electronically Signed     DF/MEDQ  D:  02/11/2007  T:  02/11/2007  Job:  [id number]   cc:   [facility] Dr. Myles Gip, M.D.  Fax: [phone]
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
## 2010-08-07 ENCOUNTER — Ambulatory Visit (HOSPITAL_COMMUNITY): Payer: Self-pay | Admitting: Psychiatry

## 2010-08-07 DIAGNOSIS — F411 Generalized anxiety disorder: Secondary | ICD-10-CM
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
## 2010-08-09 NOTE — Cardiovascular Report (Signed)
[name], Jeffrey [name]             ACCOUNT NO.:  [id number]   MEDICAL RECORD NO.:  [id number]          PATIENT TYPE:  OUT   LOCATION:  CATH                         FACILITY:  [facility]   PHYSICIAN:  Darlin Priestly, MD  DATE OF BIRTH:  1965-08-26   DATE OF PROCEDURE:  04/30/2004  DATE OF DISCHARGE:                              CARDIAC CATHETERIZATION   PROCEDURE:  1.  Left heart catheterization.  2.  Coronary angiography.  3.  Left ventriculogram.  4.  Abdominal aortogram.   INDICATIONS:  [name] is a 45 year old male, patient of Dr. Pearletha Furl.  Alanda Amass, with a history of long QT syndrome status post permanent  pacemaker implant at [facility].  By report, he had cardiac catheterization  at that time which revealed no significant CAD.  He was recently admitted on  April 27, 2004 with substernal chest pain and subsequently ruled out for  myocardial infarction.  He is now brought for cardiac catheterization to  rule out significant CAD.   DESCRIPTION OF PROCEDURE:  After informed consent, the patient was brought  to the cardiac catheterization lab.  Right and left groin were shaved,  prepped, and draped in the usual sterile fashion.   Under fluoroscopic visualization, it appears that the RV lead has a loop in  the right ventricle; however, there does not appear to be any kink or  evidence of lead fracture.   A 6-French diagnostic catheter was used to perform diagnostic angiography.   The left main is a large vessel with no significant disease.  The LAD is  large, coursed to the apex.  There is one large diagonal branch.  There is  no significant disease in the LAD.   The first diagonal is a large vessel with no significant disease.  The left  circumflex is a large vessel that coursed into the AV groove and progressed  to one obtuse marginal branch.  The AV groove to the circumflex has no  significant disease.   First OM was a large vessel that bifurcated to midsegment  with no  significant disease.  The right coronary artery was a large vessel that was  dominant.  It gave rise to the PDA and posterolateral branch.  There was no  significant disease in the RCA, posterolateral branch.   Left ventriculogram reveals moderate EF of 45-50% with mild anterior  hypokinesis.  Abdominal aortogram reveals a normal renal artery stenosis.   HEMODYNAMIC DATA:  1.  Systemic arterial pressure 139/89.  2.  LV systemic pressure 147/11.  3.  LVDP of 22.   CONCLUSION:  1.  No significant coronary artery disease.  2.  Mildly depressed left ventricular systolic function.  3.  No evidence of renal artery stenosis.  4.  Systemic hypertension.  5.  Elevated left ventricular diastolic pressure.      RHM/MEDQ  D:  04/30/2004  T:  04/30/2004  Job:  [id number]   cc:   Gerlene Burdock A. Alanda Amass, M.D.  [phone] [address], [address]  [place]  [place] [address]  Fax: [phone]

## 2010-08-09 NOTE — Discharge Summary (Signed)
Jeffrey [name], Jeffrey [name]             ACCOUNT NO.:  [id number]   MEDICAL RECORD NO.:  [id number]          PATIENT TYPE:  INP   LOCATION:  1412                         FACILITY:  [facility]   PHYSICIAN:  Hollice Espy, M.D.DATE OF BIRTH:  [birth year]   DATE OF ADMISSION:  06/02/2005  DATE OF DISCHARGE:  06/05/2005                                 DISCHARGE SUMMARY   ADDENDUM:  The patient's medications that he will be discharged on, he will  continue all of his same previous medications. These are:   1.  Aspirin 81 p.o. daily.  2.  Restoril 30 p.o. q.h.s. p.r.n.  3.  Atenolol 100 mg p.o. daily.  4.  Klonopin 1.5 p.o. b.i.d.  5.  Methadone 200 mg p.o. daily which he is receiving through a methadone      clinic. The patient has been quite compliant with his methadone doses      and when asking for prescriptions for his medications to go home on told      me that the only medications were his blood pressure as he already      receives his methadone at a methadone clinic and does not need this. I      feel that he is quite compliant with his methadone and did not show any      type of drug seeking behavior.      Hollice Espy, M.D.  Electronically Signed     SKK/MEDQ  D:  06/05/2005  T:  06/06/2005  Job:  [id number]   cc:   [facility]

## 2010-08-09 NOTE — Discharge Summary (Signed)
[name], [name]             ACCOUNT NO.:  [id number]   MEDICAL RECORD NO.:  [id number]          PATIENT TYPE:  INP   LOCATION:  1412                         FACILITY:  [facility]   PHYSICIAN:  Hollice Espy, M.D.DATE OF BIRTH:  1965-07-12   DATE OF ADMISSION:  06/02/2005  DATE OF DISCHARGE:  06/05/2005                                 DISCHARGE SUMMARY   Patient has no PCP but is being referred to [facility].   DISCHARGE DIET:  Heart-healthy diet.   ACTIVITY:  As tolerated.   CONDITION:  Improved, discharged to home.   DISCHARGE DIAGNOSES:  1.  Chest pain, rule out myocardial infarction.  No evidence of acute      cardiac ischemia.  2.  Left leg swelling, negative.  Evidence for deep venous thrombosis.  No      evidence of cellulitis.  3.  Previous history of opioid abuse.  4.  Hypertension.  5.  History of pacemaker secondary to prolonged Q-T interval.  6.  Slightly decreased ejection fraction and history of elevated left      ventricular diastolic pressure.   HISTORY OF PRESENT ILLNESS:  Patient is a pleasant 45 year old white male  who presented with substernal chest pain x24 hours, radiating down his left  arm.  The patient presented for evaluation.  He had previously had reported  complaints of chest pain and had a previous stress test at [facility].  In  addition, the patient was noticing a painful, swollen right leg, and there  was concern about possible myocardial ischemia as well as a possible DVT.   The patient was admitted to rule out patient's chest pain.  Old records were  eventually able to be obtained showing the patient had a cardiac  catheterization done in February, 2006, which was found to have no  significant CAD plus an EF of 40-45% as well as a normal stress test done at  [facility] one year prior, which was found to be negative.  At this point, the  patient's cardiac enzymes remained stable.  EKG was noted to be slightly  stable as well.  He had a  slightly elevated BNP of 191.  Following  administration of oxygen and a mild dose of Lasix, patient's breathing  improved.  In addition, in regard to the patient's right leg swelling, there  is concern about DVT.  Doppler studies were ordered.  Initial Doppler  reports were not put on chart, and when calls were placed to vascular lab,  phone reports initially reported ? DVT; however, this was followed up soon  after with a corrected report of no evidence of DVT.  Following no evidence  of DVT, the patient was thought to be medically stable, and the plan was  made for discharge.   History of drug use:  The patient has been in a methadone clinic, and this  has remained stable.   In regards to his hypertension, he is continued on his atenolol.  Patient is  being given a referral to [facility], and the plan will be to  follow up there.  Hollice Espy, M.D.  Electronically Signed     SKK/MEDQ  D:  06/05/2005  T:  06/06/2005  Job:  [id number]   cc:   [facility]
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
## 2010-08-23 ENCOUNTER — Encounter (HOSPITAL_COMMUNITY): Payer: Self-pay | Admitting: Psychiatry

## 2010-08-28 ENCOUNTER — Ambulatory Visit (HOSPITAL_COMMUNITY): Payer: Self-pay | Admitting: Marriage and Family Therapist

## 2010-09-18 ENCOUNTER — Emergency Department: Payer: Self-pay | Admitting: Internal Medicine

## 2010-11-22 ENCOUNTER — Ambulatory Visit (HOSPITAL_COMMUNITY): Payer: Self-pay | Admitting: Psychiatry

## 2010-12-12 LAB — CBC
HCT: 35.5 — ABNORMAL LOW
Hemoglobin: 12.3 — ABNORMAL LOW
MCHC: 34.6
MCV: 84.2
Platelets: 235
Platelets: 236
RBC: 4.22
RDW: 12.7
RDW: 13
WBC: 8.2

## 2010-12-12 LAB — DIFFERENTIAL
Basophils Absolute: 0
Basophils Relative: 0
Eosinophils Absolute: 0.1
Eosinophils Relative: 0
Lymphocytes Relative: 16
Lymphs Abs: 2
Neutro Abs: 6.2
Neutrophils Relative %: 69
Neutrophils Relative %: 79 — ABNORMAL HIGH

## 2010-12-12 LAB — POCT CARDIAC MARKERS
CKMB, poc: 2.3
Myoglobin, poc: 51.9
Myoglobin, poc: 76.8
Myoglobin, poc: 77.3
Operator id: 4295
Troponin i, poc: <0.05

## 2010-12-12 LAB — BASIC METABOLIC PANEL
BUN: 4 — ABNORMAL LOW
BUN: 5 — ABNORMAL LOW
Calcium: 8.5
Chloride: 102
Creatinine, Ser: 0.92
GFR calc non Af Amer: >60
Glucose, Bld: 125 — ABNORMAL HIGH

## 2010-12-12 LAB — ETHANOL: Alcohol, Ethyl (B): <5

## 2010-12-12 LAB — RAPID URINE DRUG SCREEN, HOSP PERFORMED
Amphetamines: NOT DETECTED
Cocaine: NOT DETECTED
Opiates: POSITIVE — AB
Tetrahydrocannabinol: NOT DETECTED

## 2010-12-16 LAB — DIFFERENTIAL
Basophils Relative: 1
Eosinophils Absolute: 0.1
Monocytes Relative: 5
Neutrophils Relative %: 64

## 2010-12-16 LAB — POCT I-STAT CREATININE: Creatinine, Ser: 1.3

## 2010-12-16 LAB — I-STAT 8, (EC8 V) (CONVERTED LAB)
Bicarbonate: 33 — ABNORMAL HIGH
Glucose, Bld: 99
Hemoglobin: 14.3
Sodium: 142
TCO2: 35
pH, Ven: 7.355 — ABNORMAL HIGH

## 2010-12-16 LAB — POCT CARDIAC MARKERS
CKMB, poc: <1 — ABNORMAL LOW
Myoglobin, poc: 56.9
Operator id: 196461
Troponin i, poc: <0.05

## 2010-12-16 LAB — RAPID URINE DRUG SCREEN, HOSP PERFORMED
Barbiturates: NOT DETECTED
Benzodiazepines: POSITIVE — AB
Cocaine: NOT DETECTED
Opiates: POSITIVE — AB

## 2010-12-16 LAB — CBC
MCHC: 33.6
MCV: 84.2
RBC: 4.83

## 2010-12-19 LAB — COMPREHENSIVE METABOLIC PANEL
AST: 49 — ABNORMAL HIGH
Albumin: 3.9
BUN: 10
Calcium: 8.8
Chloride: 105
Creatinine, Ser: 1.02
GFR calc Af Amer: >60
Total Bilirubin: 1.3 — ABNORMAL HIGH
Total Protein: 7

## 2010-12-19 LAB — POCT CARDIAC MARKERS
CKMB, poc: 2
Operator id: 133351
Operator id: 133351
Troponin i, poc: <0.05

## 2010-12-19 LAB — CBC
Platelets: 240
WBC: 8.7

## 2010-12-19 LAB — URINALYSIS, ROUTINE W REFLEX MICROSCOPIC
Ketones, ur: 15 — AB
Leukocytes, UA: NEGATIVE
Nitrite: NEGATIVE
Specific Gravity, Urine: 1.028
Urobilinogen, UA: 2 — ABNORMAL HIGH
pH: 5.5

## 2010-12-19 LAB — POCT I-STAT, CHEM 8
Calcium, Ion: 1.05 — ABNORMAL LOW
HCT: 38 — ABNORMAL LOW
Hemoglobin: 12.9 — ABNORMAL LOW
TCO2: 28

## 2010-12-19 LAB — PROTIME-INR: Prothrombin Time: 13

## 2010-12-19 LAB — ETHANOL: Alcohol, Ethyl (B): <5

## 2010-12-19 LAB — APTT: aPTT: 31

## 2010-12-19 LAB — RAPID URINE DRUG SCREEN, HOSP PERFORMED
Benzodiazepines: POSITIVE — AB
Tetrahydrocannabinol: NOT DETECTED

## 2010-12-19 LAB — URINE MICROSCOPIC-ADD ON

## 2010-12-31 LAB — COMPREHENSIVE METABOLIC PANEL
Albumin: 3 — ABNORMAL LOW
BUN: 4 — ABNORMAL LOW
Calcium: 7.9 — ABNORMAL LOW
Chloride: 106
Creatinine, Ser: 1.07
GFR calc non Af Amer: >60
Total Bilirubin: 0.7

## 2010-12-31 LAB — POCT I-STAT CREATININE
Creatinine, Ser: 1.2
Operator id: 198171

## 2010-12-31 LAB — CK TOTAL AND CKMB (NOT AT ARMC)
CK, MB: 1.6
CK, MB: 2
CK, MB: 2.4
Relative Index: 1.2
Total CK: 147
Total CK: 171
Total CK: 206

## 2010-12-31 LAB — I-STAT 8, (EC8 V) (CONVERTED LAB)
Acid-Base Excess: 3 — ABNORMAL HIGH
BUN: 6
Bicarbonate: 29.1 — ABNORMAL HIGH
Chloride: 102
Glucose, Bld: 99
HCT: 36 — ABNORMAL LOW
Hemoglobin: 12.2 — ABNORMAL LOW
Operator id: 198171
Potassium: 3.6
Sodium: 139
TCO2: 31
pCO2, Ven: 47.7
pH, Ven: 7.394 — ABNORMAL HIGH

## 2010-12-31 LAB — DIFFERENTIAL
Lymphocytes Relative: 21
Lymphs Abs: 1.5
Monocytes Relative: 5
Neutro Abs: 5.2
Neutrophils Relative %: 74

## 2010-12-31 LAB — POCT CARDIAC MARKERS
CKMB, poc: 15.3
CKMB, poc: 7.7
CKMB, poc: 9.5
Myoglobin, poc: 154
Myoglobin, poc: 171
Myoglobin, poc: 188
Operator id: 198171
Operator id: 198171
Operator id: 198171
Troponin i, poc: <0.05
Troponin i, poc: <0.05
Troponin i, poc: <0.05

## 2010-12-31 LAB — RAPID URINE DRUG SCREEN, HOSP PERFORMED
Amphetamines: NOT DETECTED
Amphetamines: NOT DETECTED
Barbiturates: NOT DETECTED
Benzodiazepines: POSITIVE — AB
Benzodiazepines: POSITIVE — AB
Cocaine: NOT DETECTED
Opiates: NOT DETECTED
Opiates: NOT DETECTED
Tetrahydrocannabinol: NOT DETECTED

## 2010-12-31 LAB — CBC
HCT: 31 — ABNORMAL LOW
MCHC: 35.3
MCV: 85.2
Platelets: 173
Platelets: 199
RBC: 4.17 — ABNORMAL LOW
WBC: 5.2
WBC: 7.1

## 2010-12-31 LAB — TROPONIN I
Troponin I: 0.01
Troponin I: 0.01
Troponin I: 0.01

## 2010-12-31 LAB — ETHANOL: Alcohol, Ethyl (B): <5

## 2010-12-31 LAB — PHOSPHORUS: Phosphorus: 3.6

## 2010-12-31 LAB — BASIC METABOLIC PANEL
BUN: 6
Chloride: 105
Creatinine, Ser: 1.08
GFR calc Af Amer: >60
GFR calc Af Amer: >60
GFR calc non Af Amer: >60
Potassium: 3.3 — ABNORMAL LOW

## 2011-01-03 LAB — I-STAT 8, (EC8 V) (CONVERTED LAB)
Acid-Base Excess: 2
BUN: 12
Chloride: 105
HCT: 37 — ABNORMAL LOW
Operator id: 285841
Potassium: 3.5
pH, Ven: 7.341 — ABNORMAL HIGH

## 2011-01-03 LAB — POCT CARDIAC MARKERS
Myoglobin, poc: 122
Operator id: 285841

## 2011-01-03 LAB — POCT I-STAT CREATININE: Operator id: 285841

## 2011-01-09 LAB — DIFFERENTIAL
Lymphocytes Relative: 26
Neutro Abs: 4.2
Neutrophils Relative %: 66

## 2011-01-09 LAB — PROTIME-INR: INR: 1

## 2011-01-09 LAB — BASIC METABOLIC PANEL
BUN: 6
Calcium: 8.2 — ABNORMAL LOW
GFR calc non Af Amer: >60
Glucose, Bld: 85

## 2011-01-09 LAB — POCT CARDIAC MARKERS
CKMB, poc: 3.4
Myoglobin, poc: 90.3
Troponin i, poc: <0.05

## 2011-01-09 LAB — CBC
Platelets: 205
RDW: 13.8

## 2011-01-22 ENCOUNTER — Emergency Department (HOSPITAL_COMMUNITY): Payer: Self-pay

## 2011-01-22 ENCOUNTER — Emergency Department (HOSPITAL_COMMUNITY)
Admission: EM | Admit: 2011-01-22 | Discharge: 2011-01-22 | Disposition: A | Discharge status: Home / Self Care | Payer: Self-pay | Attending: Emergency Medicine | Admitting: Emergency Medicine

## 2011-01-22 DIAGNOSIS — R51 Headache: Secondary | ICD-10-CM | POA: Insufficient documentation

## 2011-01-22 DIAGNOSIS — R42 Dizziness and giddiness: Secondary | ICD-10-CM | POA: Insufficient documentation

## 2011-01-22 DIAGNOSIS — R112 Nausea with vomiting, unspecified: Secondary | ICD-10-CM | POA: Insufficient documentation

## 2011-01-22 DIAGNOSIS — Z950 Presence of cardiac pacemaker: Secondary | ICD-10-CM | POA: Insufficient documentation

## 2011-01-22 DIAGNOSIS — I10 Essential (primary) hypertension: Secondary | ICD-10-CM | POA: Insufficient documentation

## 2011-01-22 DIAGNOSIS — R55 Syncope and collapse: Secondary | ICD-10-CM | POA: Insufficient documentation

## 2014-12-19 ENCOUNTER — Ambulatory Visit (INDEPENDENT_AMBULATORY_CARE_PROVIDER_SITE_OTHER): Payer: Self-pay | Admitting: Internal Medicine

## 2014-12-19 ENCOUNTER — Encounter: Payer: Self-pay | Admitting: Internal Medicine

## 2014-12-19 VITALS — BP 138/80 | HR 87 | Ht 73.0 in | Wt 245.0 lb

## 2014-12-19 DIAGNOSIS — F1121 Opioid dependence, in remission: Secondary | ICD-10-CM

## 2014-12-19 DIAGNOSIS — F4310 Post-traumatic stress disorder, unspecified: Secondary | ICD-10-CM

## 2014-12-19 DIAGNOSIS — F1120 Opioid dependence, uncomplicated: Secondary | ICD-10-CM

## 2014-12-19 DIAGNOSIS — I4581 Long QT syndrome: Secondary | ICD-10-CM | POA: Insufficient documentation

## 2014-12-19 DIAGNOSIS — Z0000 Encounter for general adult medical examination without abnormal findings: Secondary | ICD-10-CM

## 2014-12-19 HISTORY — DX: Opioid dependence, uncomplicated: F11.20

## 2014-12-19 HISTORY — DX: Long QT syndrome: I45.81

## 2014-12-19 HISTORY — DX: Post-traumatic stress disorder, unspecified: F43.10

## 2014-12-19 MED ORDER — CITALOPRAM HYDROBROMIDE 20 MG PO TABS: 20.0000 mg | ORAL_TABLET | Freq: Every day | ORAL | Status: DC

## 2014-12-19 MED ORDER — CLONAZEPAM 1 MG PO TABS: 1.0000 mg | ORAL_TABLET | Freq: Two times a day (BID) | ORAL | Status: DC

## 2014-12-19 MED ORDER — VITAMIN D3 50 MCG (2000 UT) PO CAPS: 2000.0000 [IU] | ORAL_CAPSULE | Freq: Every day | ORAL | Status: AC

## 2014-12-19 MED ORDER — METHADONE HCL 10 MG/5ML PO SOLN: 100.0000 mg | Freq: Every day | ORAL | Status: DC

## 2014-12-19 NOTE — Assessment & Plan Note (Signed)
On Klonopin, Celexa

## 2014-12-19 NOTE — Progress Notes (Signed)
Pre visit review using our clinic review tool, if applicable. No additional management support is needed unless otherwise documented below in the visit note. 

## 2014-12-19 NOTE — Assessment & Plan Note (Signed)
Pacemaker 1993 [facility]

## 2014-12-19 NOTE — Assessment & Plan Note (Signed)
x20 years On Methadone since 1993

## 2014-12-19 NOTE — Progress Notes (Deleted)
Subjective:  Patient ID: Jeffrey [name], male    DOB: [birth year]  Age: 49 y.o. MRN: [id number]  CC: Establish Care   HPI Jeffrey [name] presents for anxiety control. He needs to switch from [facility]. He has PTSD, anxiety - previously stable on maintenance Rx x years.   No outpatient prescriptions prior to visit.   No facility-administered medications prior to visit.    ROS Review of Systems  Constitutional: Positive for unexpected weight change. Negative for appetite change and fatigue.  HENT: Negative for congestion, nosebleeds, sneezing, sore throat and trouble swallowing.   Eyes: Negative for itching and visual disturbance.  Respiratory: Negative for cough.   Cardiovascular: Negative for chest pain, palpitations and leg swelling.  Gastrointestinal: Negative for nausea, diarrhea, blood in stool and abdominal distention.  Genitourinary: Negative for frequency and hematuria.  Musculoskeletal: Negative for back pain, joint swelling, gait problem and neck pain.  Skin: Negative for rash.  Neurological: Negative for dizziness, tremors, speech difficulty and weakness.  Psychiatric/Behavioral: Negative for suicidal ideas, sleep disturbance, dysphoric mood and agitation. The patient is nervous/anxious.     Objective:  BP 138/80 mmHg  Pulse 87  Ht 6\' 1"  (1.854 m)  Wt 245 lb (111.131 kg)  BMI 32.33 kg/m2  SpO2 97%  BP Readings from Last 3 Encounters:  12/19/14 138/80    Wt Readings from Last 3 Encounters:  12/19/14 245 lb (111.131 kg)    Physical Exam  Constitutional: He is oriented to person, place, and time. He appears well-developed. No distress.  NAD  HENT:  Mouth/Throat: Oropharynx is clear and moist.  Eyes: Conjunctivae are normal. Pupils are equal, round, and reactive to light.  Neck: Normal range of motion. No JVD present. No thyromegaly present.  Cardiovascular: Normal rate, regular rhythm, normal heart sounds and intact distal pulses.  Exam reveals no  gallop and no friction rub.   No murmur heard. Pulmonary/Chest: Effort normal and breath sounds normal. No respiratory distress. He has no wheezes. He has no rales. He exhibits no tenderness.  Abdominal: Soft. Bowel sounds are normal. He exhibits no distension and no mass. There is no tenderness. There is no rebound and no guarding.  Musculoskeletal: Normal range of motion. He exhibits edema. He exhibits no tenderness.  Lymphadenopathy:    He has no cervical adenopathy.  Neurological: He is alert and oriented to person, place, and time. He has normal reflexes. No cranial nerve deficit. He exhibits normal muscle tone. He displays a negative Romberg sign. Coordination and gait normal.  Skin: Skin is warm and dry. No rash noted.  Psychiatric: He has a normal mood and affect. His behavior is normal. Judgment and thought content normal.  obese Trace edema  Lab Results  Component Value Date   WBC 8.4 09/28/2009   HGB 12.9* 09/28/2009   HCT 40.0 09/28/2009   PLT 218 09/28/2009   GLUCOSE 106* 09/28/2009   CHOL 168 09/28/2009   TRIG 218* 09/28/2009   HDL 32* 09/28/2009   LDLCALC 92 09/28/2009   ALT 24 09/28/2009   AST 27 09/28/2009   NA 140 09/28/2009   K 4.3 09/28/2009   CL 102 09/28/2009   CREATININE 1.00 09/28/2009   BUN 16 09/28/2009   CO2 26 09/28/2009   TSH 2.625 ***Test methodology is 3rd generation TSH*** 12/13/2008   PSA 0.19 06/16/2008   INR 1.0 12/13/2008   HGBA1C  12/13/2008    5.8 (NOTE) The ADA recommends the following therapeutic goal for glycemic control related to  Hgb A1c measurement: Goal of therapy: <6.5 Hgb A1c  Reference: American Diabetes Association: Clinical Practice Recommendations 2010, Diabetes Care, 2010, 33: (Suppl  1).   MICROALBUR 1.03 06/06/2009    Ct Head Wo Contrast  01/22/2011   *RADIOLOGY REPORT*  Clinical Data: Fall.  Syncope.  Headache nausea and vomiting.  CT HEAD WITHOUT CONTRAST  Technique:  Contiguous axial images were obtained from the  base of the skull through the vertex without contrast.  Comparison: 12/14/2008  Findings:  The brain has a normal appearance without evidence for hemorrhage, infarction, hydrocephalus, or mass lesion.  There is no extra axial fluid collection.  The skull and paranasal sinuses are normal.  IMPRESSION:  1.  No acute intracranial abnormalities.  Original Report Authenticated By: Jeffrey [name], M.D.   Assessment & Plan:   Jeffrey [name] was seen today for establish care.  Diagnoses and all orders for this visit:  Long Q-T syndrome  PTSD (post-traumatic stress disorder)  Opioid dependence in remission  Other orders -     methadone (DOLOPHINE) 10 MG/5ML solution; Take 50 mLs (100 mg total) by mouth daily. Per Methadone clinic -     clonazePAM (KLONOPIN) 1 MG tablet; Take 1 tablet (1 mg total) by mouth 2 (two) times daily. -     citalopram (CELEXA) 20 MG tablet; Take 1 tablet (20 mg total) by mouth daily. -     Cholecalciferol (VITAMIN D3) 2000 UNITS capsule; Take 1 capsule (2,000 Units total) by mouth daily.  I have discontinued Jeffrey [name] METHADONE HCL PO. I have also changed his clonazePAM and citalopram. Additionally, I am having him start on methadone and Vitamin D3.  Meds ordered this encounter  Medications  . DISCONTD: citalopram (CELEXA) 20 MG tablet    Sig: Take 20 mg by mouth daily.  Marland Kitchen DISCONTD: clonazePAM (KLONOPIN) 1 MG tablet    Sig: Take 1 mg by mouth 2 (two) times daily.  Marland Kitchen DISCONTD: METHADONE HCL PO    Sig: Take by mouth daily.  . methadone (DOLOPHINE) 10 MG/5ML solution    Sig: Take 50 mLs (100 mg total) by mouth daily. Per Methadone clinic    Dispense:  1 mL    Refill:  0  . clonazePAM (KLONOPIN) 1 MG tablet    Sig: Take 1 tablet (1 mg total) by mouth 2 (two) times daily.    Dispense:  60 tablet    Refill:  3  . citalopram (CELEXA) 20 MG tablet    Sig: Take 1 tablet (20 mg total) by mouth daily.    Dispense:  30 tablet    Refill:  5  . Cholecalciferol (VITAMIN D3)  2000 UNITS capsule    Sig: Take 1 capsule (2,000 Units total) by mouth daily.    Dispense:  100 capsule    Refill:  3     Follow-up: Return in about 3 months (around 03/20/2015) for a follow-up visit.  Walker Kehr, MD

## 2015-02-21 ENCOUNTER — Emergency Department (HOSPITAL_COMMUNITY): Payer: Self-pay

## 2015-02-21 ENCOUNTER — Emergency Department (HOSPITAL_COMMUNITY)
Admission: EM | Admit: 2015-02-21 | Discharge: 2015-02-21 | Disposition: A | Discharge status: Home / Self Care | Payer: Self-pay | Attending: Emergency Medicine | Admitting: Emergency Medicine

## 2015-02-21 ENCOUNTER — Encounter (HOSPITAL_COMMUNITY): Payer: Self-pay | Admitting: Emergency Medicine

## 2015-02-21 DIAGNOSIS — R42 Dizziness and giddiness: Secondary | ICD-10-CM | POA: Insufficient documentation

## 2015-02-21 DIAGNOSIS — Z79899 Other long term (current) drug therapy: Secondary | ICD-10-CM | POA: Insufficient documentation

## 2015-02-21 DIAGNOSIS — F419 Anxiety disorder, unspecified: Secondary | ICD-10-CM | POA: Insufficient documentation

## 2015-02-21 DIAGNOSIS — L0211 Cutaneous abscess of neck: Secondary | ICD-10-CM | POA: Insufficient documentation

## 2015-02-21 DIAGNOSIS — Z950 Presence of cardiac pacemaker: Secondary | ICD-10-CM | POA: Insufficient documentation

## 2015-02-21 DIAGNOSIS — Z880 Allergy status to penicillin: Secondary | ICD-10-CM | POA: Insufficient documentation

## 2015-02-21 DIAGNOSIS — H538 Other visual disturbances: Secondary | ICD-10-CM | POA: Insufficient documentation

## 2015-02-21 DIAGNOSIS — R110 Nausea: Secondary | ICD-10-CM | POA: Insufficient documentation

## 2015-02-21 LAB — BASIC METABOLIC PANEL
ANION GAP: 6 (ref 5–15)
BUN: 17 mg/dL (ref 6–20)
CO2: 29 mmol/L (ref 22–32)
Calcium: 8.7 mg/dL — ABNORMAL LOW (ref 8.9–10.3)
Chloride: 104 mmol/L (ref 101–111)
Creatinine, Ser: 1.15 mg/dL (ref 0.61–1.24)
GFR calc Af Amer: >60 mL/min (ref >60)
GLUCOSE: 91 mg/dL (ref 65–99)
POTASSIUM: 4 mmol/L (ref 3.5–5.1)
Sodium: 139 mmol/L (ref 135–145)

## 2015-02-21 LAB — CBC WITH DIFFERENTIAL/PLATELET
BASOS ABS: 0 10*3/uL (ref 0.0–0.1)
Basophils Relative: 0 %
EOS PCT: 2 %
Eosinophils Absolute: 0.1 10*3/uL (ref 0.0–0.7)
HCT: 37.8 % — ABNORMAL LOW (ref 39.0–52.0)
Hemoglobin: 12.9 g/dL — ABNORMAL LOW (ref 13.0–17.0)
LYMPHS ABS: 2.2 10*3/uL (ref 0.7–4.0)
LYMPHS PCT: 31 %
MCH: 28.5 pg (ref 26.0–34.0)
MCHC: 34.1 g/dL (ref 30.0–36.0)
MCV: 83.4 fL (ref 78.0–100.0)
MONO ABS: 0.4 10*3/uL (ref 0.1–1.0)
MONOS PCT: 6 %
NEUTROS ABS: 4.3 10*3/uL (ref 1.7–7.7)
Neutrophils Relative %: 61 %
PLATELETS: 175 10*3/uL (ref 150–400)
RBC: 4.53 MIL/uL (ref 4.22–5.81)
RDW: 13.1 % (ref 11.5–15.5)
WBC: 7 10*3/uL (ref 4.0–10.5)

## 2015-02-21 LAB — URINALYSIS, ROUTINE W REFLEX MICROSCOPIC
Bilirubin Urine: NEGATIVE
GLUCOSE, UA: NEGATIVE mg/dL
Hgb urine dipstick: NEGATIVE
Ketones, ur: NEGATIVE mg/dL
LEUKOCYTES UA: NEGATIVE
Nitrite: NEGATIVE
PROTEIN: NEGATIVE mg/dL
Specific Gravity, Urine: 1.023 (ref 1.005–1.030)
pH: 5.5 (ref 5.0–8.0)

## 2015-02-21 LAB — CBG MONITORING, ED: GLUCOSE-CAPILLARY: 75 mg/dL (ref 65–99)

## 2015-02-21 MED ORDER — SULFAMETHOXAZOLE-TRIMETHOPRIM 800-160 MG PO TABS
1.0000 | ORAL_TABLET | Freq: Once | ORAL | Status: AC
  Administered 2015-02-21: 1 via ORAL
  Filled 2015-02-21: qty 1

## 2015-02-21 MED ORDER — ONDANSETRON HCL 4 MG/2ML IJ SOLN
4.0000 mg | Freq: Once | INTRAMUSCULAR | Status: AC
  Administered 2015-02-21: 4 mg via INTRAVENOUS
  Filled 2015-02-21: qty 2

## 2015-02-21 MED ORDER — SULFAMETHOXAZOLE-TRIMETHOPRIM 800-160 MG PO TABS: 1.0000 | ORAL_TABLET | Freq: Two times a day (BID) | ORAL | Status: AC

## 2015-02-21 MED ORDER — SODIUM CHLORIDE 0.9 % IV BOLUS (SEPSIS)
1000.0000 mL | Freq: Once | INTRAVENOUS | Status: AC
  Administered 2015-02-21: 1000 mL via INTRAVENOUS

## 2015-02-21 NOTE — ED Notes (Signed)
Bed: WA03 Expected date:  Expected time:  Means of arrival:  Comments: Hip dislocation 

## 2015-02-21 NOTE — ED Notes (Signed)
PA at bedside.

## 2015-02-21 NOTE — ED Notes (Signed)
Pt has a hard, tender, red raised area on front of neck . Approx. 1.5 cm diameter. Pt c/o nausea, dizziness and blurred vision since 4 am. Pt waas unable to see to drive at 4am. Reported that he took one Benadryl at 0800.

## 2015-02-21 NOTE — ED Notes (Signed)
Pt thought he had a medtronic pacemaker, after 6 phone calls, rn able to confirm pt has a [employer], [employer] pacemaker placed in 2006, [address] [phone], serial # [id number]. This information has been entered into triage medical hx.  [employer] rep has been contacted to come interrogate pacemaker.

## 2015-02-21 NOTE — ED Notes (Signed)
Pt alert and oriented x4. Respirations even and unlabored, bilateral symmetrical rise and fall of chest. Skin warm and dry. In no acute distress. Denies needs.   

## 2015-02-21 NOTE — ED Provider Notes (Signed)
CSN: [id number]     Arrival date & time 02/21/15  [id number] History   First MD Initiated Contact with Patient 02/21/15 1008     Chief Complaint  Patient presents with  . Dizziness    swelling on front of neck  . Vision Changes   . Abscess    (Consider location/radiation/quality/duration/timing/severity/associated sxs/prior Treatment) Patient is a 49 y.o. male presenting with dizziness and abscess. The history is provided by the patient. No language interpreter was used.  Dizziness Associated symptoms: nausea   Associated symptoms: no vomiting   Abscess Associated symptoms: nausea   Associated symptoms: no fever and no vomiting   [name] is a 48 year old male with a history of a pacemaker, anxiety, and substance abuse who presents for a lump on the right side of his neck that he noticed when he woke up this morning. He also complains of dizziness, nausea, and blurry vision since 4 AM all driving to work. He says he was unable to go to work and turned around and went home. Since then he's been progressively worse with weakness and intermittent nausea. When asked to describe the dizziness he is unable to give me a description and says that he is not dizzy now. He denies any recent illness, fever, chills, chest pain, cough, shortness of breath, abdominal pain, vomiting, diarrhea, constipation, or urinary symptoms. He denies any fall or injury.  Past Medical History  Diagnosis Date  . Pacemaker     2006 [employer], [address] [phone], serial # [id number]. [employer] Jude rep must physically interrogate pacemaker.   . Substance abuse     opioids  . Anxiety    Past Surgical History  Procedure Laterality Date  . Hernia repair Right   . Liver surgery     Family History  Problem Relation Age of Onset  . Depression Mother   . Diabetes Mother   . Arthritis Mother   . Arthritis Father    Social History  Substance Use Topics  . Smoking status: Never Smoker   . Smokeless tobacco: None  . Alcohol  Use: No    Review of Systems  Constitutional: Negative for fever.  Gastrointestinal: Positive for nausea. Negative for vomiting.  Neurological: Positive for dizziness.  All other systems reviewed and are negative.     Allergies  Codeine and Penicillins  Home Medications   Prior to Admission medications   Medication Sig Start Date End Date Taking? Authorizing Provider  Cholecalciferol (VITAMIN D3) 2000 UNITS capsule Take 1 capsule (2,000 Units total) by mouth daily. 12/19/14  Yes Aleksei Plotnikov V, MD  citalopram (CELEXA) 20 MG tablet Take 1 tablet (20 mg total) by mouth daily. 12/19/14  Yes Aleksei Plotnikov V, MD  clonazePAM (KLONOPIN) 1 MG tablet Take 1 tablet (1 mg total) by mouth 2 (two) times daily. 12/19/14  Yes Aleksei Plotnikov V, MD  diphenhydrAMINE (BENADRYL) 25 MG tablet Take 25 mg by mouth every 6 (six) hours as needed for allergies.   Yes Historical Provider, MD  ibuprofen (ADVIL,MOTRIN) 200 MG tablet Take 400 mg by mouth every 6 (six) hours as needed for headache or moderate pain.   Yes Historical Provider, MD  methadone (DOLOPHINE) 10 MG/5ML solution Take 50 mLs (100 mg total) by mouth daily. Per Methadone clinic Patient taking differently: Take 110 mg by mouth daily. Per Methadone clinic 12/19/14  Yes Aleksei Plotnikov V, MD  sulfamethoxazole-trimethoprim (BACTRIM DS,SEPTRA DS) 800-160 MG tablet Take 1 tablet by mouth 2 (two) times daily. 02/21/15  02/28/15  Arriyana Rodell Patel-Mills, PA-C   BP 123/78 mmHg  Pulse 74  Temp(Src) 97.7 F (36.5 C) (Oral)  Resp 16  SpO2 98% Physical Exam  Constitutional: He is oriented to person, place, and time. He appears well-developed and well-nourished.  HENT:  Head: Normocephalic and atraumatic.  Eyes: Conjunctivae and EOM are normal.  EOMs intact. PERRL  Neck: Normal range of motion. Neck supple.  No anterior cervical lymphadenopathy.  Cardiovascular: Normal rate, regular rhythm and normal heart sounds.   Regular rate and rhythm. He  has a pacemaker.  Pulmonary/Chest: Effort normal and breath sounds normal.  Lungs clear to auscultation bilaterally.  Abdominal: Soft. There is no tenderness.  No abdominal tenderness to palpation. Abdomen is obese. No guarding or rebound.  Musculoskeletal: Normal range of motion.  Neurological: He is alert and oriented to person, place, and time. He has normal strength. No sensory deficit. He displays a negative Romberg sign. GCS eye subscore is 4. GCS verbal subscore is 5. GCS motor subscore is 6.  Moves all x-rays without difficulty. GCS 15. Cranial nerves III through XII intact. No sensory deficit. Normal finger to nose bilaterally.  Skin: Skin is warm and dry.  2 cm indurated erythematous, tender, and warm area or on the anterior right side of the neck. No fluctuance or drainage.  Nursing note and vitals reviewed.   ED Course  Procedures (including critical care time) Labs Review Labs Reviewed  CBC WITH DIFFERENTIAL/PLATELET - Abnormal; Notable for the following:    Hemoglobin 12.9 (*)    HCT 37.8 (*)    All other components within normal limits  BASIC METABOLIC PANEL - Abnormal; Notable for the following:    Calcium 8.7 (*)    All other components within normal limits  URINALYSIS, ROUTINE W REFLEX MICROSCOPIC (NOT AT [facility]) - Abnormal; Notable for the following:    APPearance CLOUDY (*)    All other components within normal limits  CBG MONITORING, ED  CBG MONITORING, ED    Imaging Review Dg Chest 2 View  02/21/2015  CLINICAL DATA:  Dizziness. EXAM: CHEST  2 VIEW COMPARISON:  October 17, 2009. FINDINGS: Stable cardiomediastinal silhouette. No pneumothorax or pleural effusion is noted. Left-sided pacemaker is unchanged in position. Both lungs are clear. The visualized skeletal structures are unremarkable. IMPRESSION: No active cardiopulmonary disease. Electronically Signed   By: Marijo Conception, M.D.   On: 02/21/2015 12:02   I have personally reviewed and evaluated these images  and lab results as part of my medical decision-making.   EKG Interpretation   Date/Time:  Wednesday February 21 2015 10:36:45 EST Ventricular Rate:  75 PR Interval:  166 QRS Duration: 100 QT Interval:  411 QTC Calculation: 459 R Axis:   89 Text Interpretation:  Atrial-paced rhythm Low voltage, precordial leads  Confirmed by Hazle Coca [phone]) on 02/21/2015 12:23:15 PM      MDM   Final diagnoses:  Dizziness  Abscess, neck  Patient presents for blurry vision, neck nodule, dizziness, nausea, and weakness that began at 4 AM today. No vision changes at this time.  He appears to have some sort of bite on his neck that is most likely infected which will be treated with Bactrim. There is no fluctuance and it does not appear to need lancing. He is orthostatic. He was given fluids. His labs are not concerning and chest x-ray is negative for pneumonia or edema. His neuro exam was normal. He has a [employer] Jude[employer] pacemaker which will need interrogating.  Recheck @ 2:05: Patient now states that he has had palpitations intermittently for the past 3 months with chest pain. He states he has had his pacemaker for over 10 years and has not been seen for an interrogation of the pacemaker in almost 3 years, since February 2014. He also believes that the battery in his pacemaker is dead. Nurse reports that [facility] will be here to interrogate his pacemaker in 45 minutes.  Pacemaker was interrogated by tech from [facility] who stated there was no atrial fibrillation but that she could not see anything prior to the last couple days due to the age of his pacemaker and the fact that he had not had it interrogated in almost 3 years. I discussed with the patient that he would need to follow-up to have this done and he stated he did not want to go back to [facility] and why to find someone local. He also said he would follow up with his primary care physician regarding his neck abscess in order to have this rechecked and to  be referred to a physician at [facility] regarding his pacemaker. His pacer battery is still good for another 1-2 years. He also explained that he has been working a lot and not sleeping well over the past 6 months. He is complaining of not having much of an appetite. This may be contributing to his symptoms. These are all things that he can discuss with his primary care physician. I gave him a work note for the next 2 days. I discussed return precautions with the patient as well as follow-up and he verbally agrees with the plan.     Ottie Glazier, PA-C 02/22/15 [place], MD 02/22/15 [phone]

## 2015-02-21 NOTE — Progress Notes (Signed)
Consulted with ED PA Pt without CM needs

## 2015-02-21 NOTE — Discharge Instructions (Signed)
Abscess An abscess is an infected area that contains a collection of pus and debris.It can occur in almost any part of the body. An abscess is also known as a furuncle or boil. CAUSES  An abscess occurs when tissue gets infected. This can occur from blockage of oil or sweat glands, infection of hair follicles, or a minor injury to the skin. As the body tries to fight the infection, pus collects in the area and creates pressure under the skin. This pressure causes pain. People with weakened immune systems have difficulty fighting infections and get certain abscesses more often.  SYMPTOMS Usually an abscess develops on the skin and becomes a painful mass that is red, warm, and tender. If the abscess forms under the skin, you may feel a moveable soft area under the skin. Some abscesses break open (rupture) on their own, but most will continue to get worse without care. The infection can spread deeper into the body and eventually into the bloodstream, causing you to feel ill.  DIAGNOSIS  Your caregiver will take your medical history and perform a physical exam. A sample of fluid may also be taken from the abscess to determine what is causing your infection. TREATMENT  Your caregiver may prescribe antibiotic medicines to fight the infection. However, taking antibiotics alone usually does not cure an abscess. Your caregiver may need to make a small cut (incision) in the abscess to drain the pus. In some cases, gauze is packed into the abscess to reduce pain and to continue draining the area. HOME CARE INSTRUCTIONS   Only take over-the-counter or prescription medicines for pain, discomfort, or fever as directed by your caregiver.  If you were prescribed antibiotics, take them as directed. Finish them even if you start to feel better.  If gauze is used, follow your caregiver's directions for changing the gauze.  To avoid spreading the infection:  Keep your draining abscess covered with a  bandage.  Wash your hands well.  Do not share personal care items, towels, or whirlpools with others.  Avoid skin contact with others.  Keep your skin and clothes clean around the abscess.  Keep all follow-up appointments as directed by your caregiver. SEEK MEDICAL CARE IF:   You have increased pain, swelling, redness, fluid drainage, or bleeding.  You have muscle aches, chills, or a general ill feeling.  You have a fever. MAKE SURE YOU:   Understand these instructions.  Will watch your condition.  Will get help right away if you are not doing well or get worse.   This information is not intended to replace advice given to you by your health care provider. Make sure you discuss any questions you have with your health care provider.   Document Released: 12/18/2004 Document Revised: 09/09/2011 Document Reviewed: 05/23/2011 Elsevier Interactive Patient Education 2016 Else[employer]  Dizziness Dizziness is a common problem. It makes you feel unsteady or lightheaded. You may feel like you are about to pass out (faint). Dizziness can lead to injury if you stumble or fall. Anyone can get dizzy, but dizziness is more common in older adults. This condition can be caused by a number of things, including:  Medicines.  Dehydration.  Illness. HOME CARE Following these instructions may help with your condition: Eating and Drinking  Drink enough fluid to keep your pee (urine) clear or pale yellow. This helps to keep you from getting dehydrated. Try to drink more clear fluids, such as water.  Do not drink alcohol.  Limit how  much caffeine you drink or eat if told by your doctor.  Limit how much salt you drink or eat if told by your doctor. Activity  Avoid making quick movements.  When you stand up from sitting in a chair, steady yourself until you feel okay.  In the morning, first sit up on the side of the bed. When you feel okay, stand slowly while you hold onto something. Do  this until you know that your balance is fine.  Move your legs often if you need to stand in one place for a long time. Tighten and relax your muscles in your legs while you are standing.  Do not drive or use heavy machinery if you feel dizzy.  Avoid bending down if you feel dizzy. Place items in your home so that they are easy for you to reach without leaning over. Lifestyle  Do not use any tobacco products, including cigarettes, chewing tobacco, or electronic cigarettes. If you need help quitting, ask your doctor.  Try to lower your stress level, such as with yoga or meditation. Talk with your doctor if you need help. General Instructions  Watch your dizziness for any changes.  Take medicines only as told by your doctor. Talk with your doctor if you think that your dizziness is caused by a medicine that you are taking.  Tell a friend or a family member that you are feeling dizzy. If he or she notices any changes in your behavior, have this person call your doctor.  Keep all follow-up visits as told by your doctor. This is important. GET HELP IF:  Your dizziness does not go away.  Your dizziness or light-headedness gets worse.  You feel sick to your stomach (nauseous).  You have trouble hearing.  You have new symptoms.  You are unsteady on your feet or you feel like the room is spinning. GET HELP RIGHT AWAY IF:  You throw up (vomit) or have diarrhea and are unable to eat or drink anything.  You have trouble:  Talking.  Walking.  Swallowing.  Using your arms, hands, or legs.  You feel generally weak.  You are not thinking clearly or you have trouble forming sentences. It may take a friend or family member to notice this.  You have:  Chest pain.  Pain in your belly (abdomen).  Shortness of breath.  Sweating.  Your vision changes.  You are bleeding.  You have a headache.  You have neck pain or a stiff neck.  You have a fever.   This information is  not intended to replace advice given to you by your health care provider. Make sure you discuss any questions you have with your health care provider.   Document Released: 02/27/2011 Document Revised: 07/25/2014 Document Reviewed: 03/06/2014 Elsevier Interactive Patient Education [employer].

## 2015-02-21 NOTE — ED Notes (Signed)
Rn spoke with [facility], they must come out an interrogate pacemaker themselves.

## 2015-02-21 NOTE — ED Notes (Signed)
[employer] rep at bedside.  

## 2015-02-21 NOTE — ED Notes (Signed)
Bed: WA26 Expected date:  Expected time:  Means of arrival:  Comments: 

## 2015-03-09 ENCOUNTER — Encounter: Payer: Self-pay | Admitting: Internal Medicine

## 2015-03-09 ENCOUNTER — Ambulatory Visit (INDEPENDENT_AMBULATORY_CARE_PROVIDER_SITE_OTHER): Payer: Self-pay | Admitting: Internal Medicine

## 2015-03-09 VITALS — BP 160/90 | HR 75 | Wt 248.0 lb

## 2015-03-09 DIAGNOSIS — L0291 Cutaneous abscess, unspecified: Secondary | ICD-10-CM

## 2015-03-09 DIAGNOSIS — F1121 Opioid dependence, in remission: Secondary | ICD-10-CM

## 2015-03-09 DIAGNOSIS — F4310 Post-traumatic stress disorder, unspecified: Secondary | ICD-10-CM

## 2015-03-09 DIAGNOSIS — R253 Fasciculation: Secondary | ICD-10-CM | POA: Insufficient documentation

## 2015-03-09 DIAGNOSIS — L0211 Cutaneous abscess of neck: Secondary | ICD-10-CM

## 2015-03-09 HISTORY — DX: Cutaneous abscess, unspecified: L02.91

## 2015-03-09 HISTORY — DX: Fasciculation: R25.3

## 2015-03-09 MED ORDER — CITALOPRAM HYDROBROMIDE 20 MG PO TABS: 20.0000 mg | ORAL_TABLET | Freq: Every day | ORAL | Status: DC

## 2015-03-09 MED ORDER — DOXYCYCLINE HYCLATE 100 MG PO TABS: 100.0000 mg | ORAL_TABLET | Freq: Two times a day (BID) | ORAL | Status: DC

## 2015-03-09 MED ORDER — DOXYCYCLINE HYCLATE 100 MG PO TABS
100.0000 mg | ORAL_TABLET | Freq: Two times a day (BID) | ORAL | Status: DC
Start: 2015-03-09 — End: 2015-03-09

## 2015-03-09 MED ORDER — CLONAZEPAM 1 MG PO TABS: 1.0000 mg | ORAL_TABLET | Freq: Two times a day (BID) | ORAL | Status: DC

## 2015-03-09 NOTE — Progress Notes (Signed)
Subjective:  Patient ID: Jeffrey [name], male    DOB: 1965/08/08  Age: 49 y.o. MRN: [id number]  CC: No chief complaint on file.   HPI Eliut Gilday Huertas presents for skin pimple on the R medial neck - treated on 11/30 - better. F/u anxiety, PTSD, opioid dependence  Outpatient Prescriptions Prior to Visit  Medication Sig Dispense Refill  . Cholecalciferol (VITAMIN D3) 2000 UNITS capsule Take 1 capsule (2,000 Units total) by mouth daily. 100 capsule 3  . diphenhydrAMINE (BENADRYL) 25 MG tablet Take 25 mg by mouth every 6 (six) hours as needed for allergies.    Marland Kitchen ibuprofen (ADVIL,MOTRIN) 200 MG tablet Take 400 mg by mouth every 6 (six) hours as needed for headache or moderate pain.    . methadone (DOLOPHINE) 10 MG/5ML solution Take 50 mLs (100 mg total) by mouth daily. Per Methadone clinic (Patient taking differently: Take 110 mg by mouth daily. Per Methadone clinic) 1 mL 0  . citalopram (CELEXA) 20 MG tablet Take 1 tablet (20 mg total) by mouth daily. 30 tablet 5  . clonazePAM (KLONOPIN) 1 MG tablet Take 1 tablet (1 mg total) by mouth 2 (two) times daily. 60 tablet 3   No facility-administered medications prior to visit.    ROS Review of Systems  Constitutional: Negative for appetite change, fatigue and unexpected weight change.  HENT: Negative for congestion, nosebleeds, sneezing, sore throat and trouble swallowing.   Eyes: Negative for itching and visual disturbance.  Respiratory: Negative for cough.   Cardiovascular: Negative for chest pain, palpitations and leg swelling.  Gastrointestinal: Negative for nausea, diarrhea, blood in stool and abdominal distention.  Genitourinary: Negative for frequency and hematuria.  Musculoskeletal: Negative for back pain, joint swelling, gait problem and neck pain.  Skin: Negative for rash.  Neurological: Negative for dizziness, tremors, speech difficulty and weakness.  Psychiatric/Behavioral: Negative for sleep disturbance, dysphoric mood and  agitation. The patient is not nervous/anxious.     Objective:  BP 160/90 mmHg  Pulse 75  Wt 248 lb (112.492 kg)  SpO2 97%  BP Readings from Last 3 Encounters:  03/09/15 160/90  02/21/15 123/78  12/19/14 138/80    Wt Readings from Last 3 Encounters:  03/09/15 248 lb (112.492 kg)  12/19/14 245 lb (111.131 kg)    Physical Exam  Constitutional: He is oriented to person, place, and time. He appears well-developed. No distress.  NAD  HENT:  Mouth/Throat: Oropharynx is clear and moist.  Eyes: Conjunctivae are normal. Pupils are equal, round, and reactive to light.  Neck: Normal range of motion. No JVD present. No thyromegaly present.  Cardiovascular: Normal rate, regular rhythm, normal heart sounds and intact distal pulses.  Exam reveals no gallop and no friction rub.   No murmur heard. Pulmonary/Chest: Effort normal and breath sounds normal. No respiratory distress. He has no wheezes. He has no rales. He exhibits no tenderness.  Abdominal: Soft. Bowel sounds are normal. He exhibits no distension and no mass. There is no tenderness. There is no rebound and no guarding.  Musculoskeletal: Normal range of motion. He exhibits no edema or tenderness.  Lymphadenopathy:    He has no cervical adenopathy.  Neurological: He is alert and oriented to person, place, and time. He has normal reflexes. No cranial nerve deficit. He exhibits normal muscle tone. He displays a negative Romberg sign. Coordination and gait normal.  Skin: Skin is warm and dry. No rash noted.  Psychiatric: He has a normal mood and affect. His behavior is normal.  Judgment and thought content normal.  5 mm sq nodule R central neck Lips fasciculate  Dg Chest 2 View  02/21/2015  CLINICAL DATA:  Dizziness. EXAM: CHEST  2 VIEW COMPARISON:  October 17, 2009. FINDINGS: Stable cardiomediastinal silhouette. No pneumothorax or pleural effusion is noted. Left-sided pacemaker is unchanged in position. Both lungs are clear. The visualized  skeletal structures are unremarkable. IMPRESSION: No active cardiopulmonary disease. Electronically Signed   By: Marijo Conception, M.D.   On: 02/21/2015 12:02    Assessment & Plan:   Diagnoses and all orders for this visit:  Cutaneous abscess of neck  Opioid dependence in remission ([place])  PTSD (post-traumatic stress disorder)  Muscular fasciculation  Other orders -     clonazePAM (KLONOPIN) 1 MG tablet; Take 1 tablet (1 mg total) by mouth 2 (two) times daily. -     citalopram (CELEXA) 20 MG tablet; Take 1 tablet (20 mg total) by mouth daily. -     Discontinue: doxycycline (VIBRA-TABS) 100 MG tablet; Take 1 tablet (100 mg total) by mouth 2 (two) times daily. -     doxycycline (VIBRA-TABS) 100 MG tablet; Take 1 tablet (100 mg total) by mouth 2 (two) times daily.   I am having Mr. Rorke maintain his methadone, Vitamin D3, ibuprofen, diphenhydrAMINE, clonazePAM, citalopram, and doxycycline.  Meds ordered this encounter  Medications  . clonazePAM (KLONOPIN) 1 MG tablet    Sig: Take 1 tablet (1 mg total) by mouth 2 (two) times daily.    Dispense:  60 tablet    Refill:  3  . citalopram (CELEXA) 20 MG tablet    Sig: Take 1 tablet (20 mg total) by mouth daily.    Dispense:  30 tablet    Refill:  5  . DISCONTD: doxycycline (VIBRA-TABS) 100 MG tablet    Sig: Take 1 tablet (100 mg total) by mouth 2 (two) times daily.    Dispense:  20 tablet    Refill:  0  . doxycycline (VIBRA-TABS) 100 MG tablet    Sig: Take 1 tablet (100 mg total) by mouth 2 (two) times daily.    Dispense:  20 tablet    Refill:  0     Follow-up: Return in about 3 months (around 06/07/2015) for a follow-up visit.  Walker Kehr, MD

## 2015-03-09 NOTE — Assessment & Plan Note (Signed)
12/16 better Doxy if not resolved

## 2015-03-09 NOTE — Assessment & Plan Note (Signed)
On Methadone since 1993 - Methadone clinic 

## 2015-03-09 NOTE — Assessment & Plan Note (Signed)
On Klonopin, Celexa  Potential benefits of a long term benzodiazepines  use as well as potential risks  and complications were explained to the patient and were aknowledged. 

## 2015-03-09 NOTE — Progress Notes (Signed)
Pre visit review using our clinic review tool, if applicable. No additional management support is needed unless otherwise documented below in the visit note. 

## 2015-03-09 NOTE — Assessment & Plan Note (Signed)
Face, lips - chronic

## 2015-04-06 ENCOUNTER — Encounter (HOSPITAL_COMMUNITY): Payer: Self-pay | Admitting: *Deleted

## 2015-04-06 ENCOUNTER — Emergency Department (HOSPITAL_COMMUNITY)
Admission: EM | Admit: 2015-04-06 | Discharge: 2015-04-06 | Disposition: A | Discharge status: Home / Self Care | Payer: BLUE CROSS/BLUE SHIELD | Attending: Emergency Medicine | Admitting: Emergency Medicine

## 2015-04-06 DIAGNOSIS — Z880 Allergy status to penicillin: Secondary | ICD-10-CM | POA: Insufficient documentation

## 2015-04-06 DIAGNOSIS — Z79899 Other long term (current) drug therapy: Secondary | ICD-10-CM | POA: Insufficient documentation

## 2015-04-06 DIAGNOSIS — Z792 Long term (current) use of antibiotics: Secondary | ICD-10-CM | POA: Insufficient documentation

## 2015-04-06 DIAGNOSIS — J320 Chronic maxillary sinusitis: Secondary | ICD-10-CM | POA: Insufficient documentation

## 2015-04-06 DIAGNOSIS — Z950 Presence of cardiac pacemaker: Secondary | ICD-10-CM | POA: Insufficient documentation

## 2015-04-06 DIAGNOSIS — R040 Epistaxis: Secondary | ICD-10-CM | POA: Insufficient documentation

## 2015-04-06 DIAGNOSIS — F419 Anxiety disorder, unspecified: Secondary | ICD-10-CM | POA: Insufficient documentation

## 2015-04-06 MED ORDER — LEVOFLOXACIN 500 MG PO TABS: 500.0000 mg | ORAL_TABLET | Freq: Every day | ORAL | Status: DC

## 2015-04-06 MED ORDER — LEVOFLOXACIN 750 MG PO TABS
750.0000 mg | ORAL_TABLET | Freq: Once | ORAL | Status: AC
  Administered 2015-04-06: 750 mg via ORAL
  Filled 2015-04-06: qty 1

## 2015-04-06 NOTE — ED Notes (Signed)
Pt states he is here for frequent bloody nose, dizziness when he bends over, and to receive antibiotics and a work note.

## 2015-04-06 NOTE — Discharge Instructions (Signed)
Sinusitis, Adult Sinusitis is redness, soreness, and puffiness (inflammation) of the air pockets in the bones of your face (sinuses). The redness, soreness, and puffiness can cause air and mucus to get trapped in your sinuses. This can allow germs to grow and cause an infection.  HOME CARE   Drink enough fluids to keep your pee (urine) clear or pale yellow.  Use a humidifier in your home.  Run a hot shower to create steam in the bathroom. Sit in the bathroom with the door closed. Breathe in the steam 3-4 times a day.  Put a warm, moist washcloth on your face 3-4 times a day, or as told by your doctor.  Use salt water sprays (saline sprays) to wet the thick fluid in your nose. This can help the sinuses drain.  Only take medicine as told by your doctor. GET HELP RIGHT AWAY IF:   Your pain gets worse.  You have very bad headaches.  You are sick to your stomach (nauseous).  You throw up (vomit).  You are very sleepy (drowsy) all the time.  Your face is puffy (swollen).  Your vision changes.  You have a stiff neck.  You have trouble breathing. MAKE SURE YOU:   Understand these instructions.  Will watch your condition.  Will get help right away if you are not doing well or get worse.   This information is not intended to replace advice given to you by your health care provider. Make sure you discuss any questions you have with your health care provider.   Document Released: 08/27/2007 Document Revised: 03/31/2014 Document Reviewed: 10/14/2011 Elsevier Interactive Patient Education [employer].   Antibiotic twice a day for your sinusitis. Follow-up with ear nose and throat doctor. Phone number given.
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
## 2015-04-10 ENCOUNTER — Telehealth: Payer: Self-pay | Admitting: Internal Medicine

## 2015-04-10 DIAGNOSIS — R040 Epistaxis: Secondary | ICD-10-CM
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
## 2015-04-10 NOTE — Telephone Encounter (Signed)
Notified pt with md response. Verified of pt already have ENT md he stated no. Inform will place a referral once appt has been set-up- will received call back with appt info...Jeffrey [name]

## 2015-04-10 NOTE — Telephone Encounter (Signed)
Pt needs to see his ENT first. D/c Levaquin if pain is worse. OV if problems Thx

## 2015-04-20 ENCOUNTER — Telehealth: Payer: Self-pay | Admitting: Internal Medicine

## 2015-04-20 NOTE — Telephone Encounter (Signed)
Pt called in would like the nurse to give him a call.  He wanted to explain in detail about a note he needs from [facility] for his surgery

## 2015-04-20 NOTE — Telephone Encounter (Signed)
I called pt- he needs a letter stating pt can not go to work until his ENT clears him. He is scheduled to see ENT 05/02/15. He is requesting this before 04/25/15, if possible. Call pt when letter is ready.

## 2015-04-21 NOTE — Telephone Encounter (Signed)
Pls print a letter - it is ready Thx

## 2015-04-23 NOTE — Telephone Encounter (Signed)
Letter printed/signed/ready for p/u. Pt informed

## 2015-06-11 ENCOUNTER — Encounter: Payer: Self-pay | Admitting: Internal Medicine

## 2015-06-11 ENCOUNTER — Ambulatory Visit (INDEPENDENT_AMBULATORY_CARE_PROVIDER_SITE_OTHER): Payer: Self-pay | Admitting: Internal Medicine

## 2015-06-11 VITALS — BP 140/90 | HR 88 | Wt 236.0 lb

## 2015-06-11 DIAGNOSIS — I4581 Long QT syndrome: Secondary | ICD-10-CM

## 2015-06-11 DIAGNOSIS — F4310 Post-traumatic stress disorder, unspecified: Secondary | ICD-10-CM

## 2015-06-11 DIAGNOSIS — F1121 Opioid dependence, in remission: Secondary | ICD-10-CM

## 2015-06-11 MED ORDER — CLONAZEPAM 1 MG PO TABS: 1.0000 mg | ORAL_TABLET | Freq: Two times a day (BID) | ORAL | Status: DC

## 2015-06-11 MED ORDER — CITALOPRAM HYDROBROMIDE 40 MG PO TABS: 40.0000 mg | ORAL_TABLET | Freq: Every day | ORAL | Status: DC

## 2015-06-11 MED ORDER — CITALOPRAM HYDROBROMIDE 20 MG PO TABS: 20.0000 mg | ORAL_TABLET | Freq: Every day | ORAL | Status: DC

## 2015-06-11 NOTE — Assessment & Plan Note (Signed)
On Methadone since 1993 - Methadone clinic 

## 2015-06-11 NOTE — Progress Notes (Signed)
Subjective:  Patient ID: Jeffrey [name], male    DOB: 10-21-1965  Age: 50 y.o. MRN: [id number]  CC: No chief complaint on file.   HPI Jeffrey [name] presents for anxiety, panic attacks, PTSD f/u  Outpatient Prescriptions Prior to Visit  Medication Sig Dispense Refill  . Cholecalciferol (VITAMIN D3) 2000 UNITS capsule Take 1 capsule (2,000 Units total) by mouth daily. 100 capsule 3  . citalopram (CELEXA) 20 MG tablet Take 1 tablet (20 mg total) by mouth daily. 30 tablet 5  . clonazePAM (KLONOPIN) 1 MG tablet Take 1 tablet (1 mg total) by mouth 2 (two) times daily. 60 tablet 3  . doxycycline (VIBRA-TABS) 100 MG tablet Take 1 tablet (100 mg total) by mouth 2 (two) times daily. 20 tablet 0  . ibuprofen (ADVIL,MOTRIN) 200 MG tablet Take 400 mg by mouth every 6 (six) hours as needed for headache or moderate pain.    . methadone (DOLOPHINE) 10 MG/5ML solution Take 50 mLs (100 mg total) by mouth daily. Per Methadone clinic (Patient taking differently: Take 110 mg by mouth daily. Per Methadone clinic) 1 mL 0  . levofloxacin (LEVAQUIN) 500 MG tablet Take 1 tablet (500 mg total) by mouth daily. 20 tablet 0  . diphenhydrAMINE (BENADRYL) 25 MG tablet Take 25 mg by mouth every 6 (six) hours as needed for allergies. Reported on 06/11/2015     No facility-administered medications prior to visit.    ROS Review of Systems  Constitutional: Negative for appetite change, fatigue and unexpected weight change.  HENT: Negative for congestion, nosebleeds, sneezing, sore throat and trouble swallowing.   Eyes: Negative for itching and visual disturbance.  Respiratory: Negative for cough.   Cardiovascular: Negative for chest pain, palpitations and leg swelling.  Gastrointestinal: Negative for nausea, diarrhea, blood in stool and abdominal distention.  Genitourinary: Negative for frequency and hematuria.  Musculoskeletal: Negative for back pain, joint swelling, gait problem and neck pain.  Skin: Negative  for rash.  Neurological: Negative for dizziness, tremors, speech difficulty and weakness.  Psychiatric/Behavioral: Positive for decreased concentration. Negative for suicidal ideas, sleep disturbance, dysphoric mood and agitation. The patient is nervous/anxious.     Objective:  BP 140/90 mmHg  Pulse 88  Wt 236 lb (107.049 kg)  SpO2 98%  BP Readings from Last 3 Encounters:  06/11/15 140/90  04/06/15 144/95  03/09/15 160/90    Wt Readings from Last 3 Encounters:  06/11/15 236 lb (107.049 kg)  03/09/15 248 lb (112.492 kg)  12/19/14 245 lb (111.131 kg)    Physical Exam  Constitutional: He is oriented to person, place, and time. He appears well-developed. No distress.  NAD  HENT:  Mouth/Throat: Oropharynx is clear and moist.  Eyes: Conjunctivae are normal. Pupils are equal, round, and reactive to light.  Neck: Normal range of motion. No JVD present. No thyromegaly present.  Cardiovascular: Normal rate, regular rhythm, normal heart sounds and intact distal pulses.  Exam reveals no gallop and no friction rub.   No murmur heard. Pulmonary/Chest: Effort normal and breath sounds normal. No respiratory distress. He has no wheezes. He has no rales. He exhibits no tenderness.  Abdominal: Soft. Bowel sounds are normal. He exhibits no distension and no mass. There is no tenderness. There is no rebound and no guarding.  Musculoskeletal: Normal range of motion. He exhibits no edema or tenderness.  Lymphadenopathy:    He has no cervical adenopathy.  Neurological: He is alert and oriented to person, place, and time. He has normal reflexes.  No cranial nerve deficit. He exhibits normal muscle tone. He displays a negative Romberg sign. Coordination and gait normal.  Skin: Skin is warm and dry. No rash noted.  Psychiatric: He has a normal mood and affect. His behavior is normal. Judgment and thought content normal.     No results found.  Assessment & Plan:   There are no diagnoses linked to  this encounter. I have discontinued Mr. Tiznado's levofloxacin. I am also having him maintain his methadone, Vitamin D3, ibuprofen, diphenhydrAMINE, clonazePAM, citalopram, doxycycline, atenolol, and fluticasone.  Meds ordered this encounter  Medications  . atenolol (TENORMIN) 50 MG tablet    Sig: Take 50 mg by mouth daily.  . fluticasone (FLONASE) 50 MCG/ACT nasal spray    Sig: as needed.     Follow-up: No Follow-up on file.  Walker Kehr, MD

## 2015-06-11 NOTE — Assessment & Plan Note (Signed)
Celexa low dose - no issues w/QT

## 2015-06-11 NOTE — Progress Notes (Signed)
Pre visit review using our clinic review tool, if applicable. No additional management support is needed unless otherwise documented below in the visit note. 

## 2015-09-12 ENCOUNTER — Ambulatory Visit (INDEPENDENT_AMBULATORY_CARE_PROVIDER_SITE_OTHER): Payer: Self-pay | Admitting: Internal Medicine

## 2015-09-12 ENCOUNTER — Encounter: Payer: Self-pay | Admitting: Internal Medicine

## 2015-09-12 VITALS — BP 140/84 | HR 75 | Wt 236.0 lb

## 2015-09-12 DIAGNOSIS — F4310 Post-traumatic stress disorder, unspecified: Secondary | ICD-10-CM

## 2015-09-12 MED ORDER — CITALOPRAM HYDROBROMIDE 40 MG PO TABS: 40.0000 mg | ORAL_TABLET | Freq: Every day | ORAL | Status: DC

## 2015-09-12 MED ORDER — CLONAZEPAM 1 MG PO TABS: 1.0000 mg | ORAL_TABLET | Freq: Two times a day (BID) | ORAL | Status: DC

## 2015-09-12 NOTE — Progress Notes (Signed)
Pre visit review using our clinic review tool, if applicable. No additional management support is needed unless otherwise documented below in the visit note. 

## 2015-09-12 NOTE — Progress Notes (Signed)
Subjective:  Patient ID: Jeffrey [name], male    DOB: 1965-06-27  Age: 50 y.o. MRN: [id number]  CC: No chief complaint on file.   HPI Jaithan Morlan Rinke presents for PTSD, depression, anxiety f/u  Outpatient Prescriptions Prior to Visit  Medication Sig Dispense Refill  . atenolol (TENORMIN) 50 MG tablet Take 50 mg by mouth daily.    . Cholecalciferol (VITAMIN D3) 2000 UNITS capsule Take 1 capsule (2,000 Units total) by mouth daily. 100 capsule 3  . diphenhydrAMINE (BENADRYL) 25 MG tablet Take 25 mg by mouth every 6 (six) hours as needed for allergies. Reported on 06/11/2015    . fluticasone (FLONASE) 50 MCG/ACT nasal spray as needed.    Marland Kitchen ibuprofen (ADVIL,MOTRIN) 200 MG tablet Take 400 mg by mouth every 6 (six) hours as needed for headache or moderate pain.    . methadone (DOLOPHINE) 10 MG/5ML solution Take 50 mLs (100 mg total) by mouth daily. Per Methadone clinic (Patient taking differently: Take 110 mg by mouth daily. Per Methadone clinic) 1 mL 0  . citalopram (CELEXA) 20 MG tablet Take 1 tablet (20 mg total) by mouth daily. 30 tablet 5  . clonazePAM (KLONOPIN) 1 MG tablet Take 1 tablet (1 mg total) by mouth 2 (two) times daily. 60 tablet 3   No facility-administered medications prior to visit.    ROS Review of Systems  Constitutional: Negative for appetite change, fatigue and unexpected weight change.  HENT: Negative for congestion, nosebleeds, sneezing, sore throat and trouble swallowing.   Eyes: Negative for itching and visual disturbance.  Respiratory: Negative for cough.   Cardiovascular: Negative for chest pain, palpitations and leg swelling.  Gastrointestinal: Negative for nausea, diarrhea, blood in stool and abdominal distention.  Genitourinary: Negative for frequency and hematuria.  Musculoskeletal: Negative for back pain, joint swelling, gait problem and neck pain.  Skin: Negative for rash.  Neurological: Negative for dizziness, tremors, speech difficulty and  weakness.  Psychiatric/Behavioral: Positive for decreased concentration. Negative for suicidal ideas, sleep disturbance, dysphoric mood and agitation. The patient is nervous/anxious.     Objective:  BP 140/84 mmHg  Pulse 75  Wt 236 lb (107.049 kg)  SpO2 98%  BP Readings from Last 3 Encounters:  09/12/15 140/84  06/11/15 140/90  04/06/15 144/95    Wt Readings from Last 3 Encounters:  09/12/15 236 lb (107.049 kg)  06/11/15 236 lb (107.049 kg)  03/09/15 248 lb (112.492 kg)    Physical Exam  Constitutional: He is oriented to person, place, and time. He appears well-developed. No distress.  NAD  HENT:  Mouth/Throat: Oropharynx is clear and moist.  Eyes: Conjunctivae are normal. Pupils are equal, round, and reactive to light.  Neck: Normal range of motion. No JVD present. No thyromegaly present.  Cardiovascular: Normal rate, regular rhythm, normal heart sounds and intact distal pulses.  Exam reveals no gallop and no friction rub.   No murmur heard. Pulmonary/Chest: Effort normal and breath sounds normal. No respiratory distress. He has no wheezes. He has no rales. He exhibits no tenderness.  Abdominal: Soft. Bowel sounds are normal. He exhibits no distension and no mass. There is no tenderness. There is no rebound and no guarding.  Musculoskeletal: Normal range of motion. He exhibits no edema or tenderness.  Lymphadenopathy:    He has no cervical adenopathy.  Neurological: He is alert and oriented to person, place, and time. He has normal reflexes. No cranial nerve deficit. He exhibits normal muscle tone. He displays a negative Romberg sign.  Coordination and gait normal.  Skin: Skin is warm and dry. No rash noted.  Psychiatric: He has a normal mood and affect. His behavior is normal. Judgment and thought content normal.  Obese A/o/c No face twitching L foot is twitching   No results found.  Assessment & Plan:   There are no diagnoses linked to this encounter. I have  discontinued Mr. Rodenbeck's citalopram. I have also changed his citalopram. Additionally, I am having him maintain his methadone, Vitamin D3, ibuprofen, diphenhydrAMINE, atenolol, fluticasone, and clonazePAM.  Meds ordered this encounter  Medications  . DISCONTD: citalopram (CELEXA) 40 MG tablet    Sig: Take 1 tablet by mouth daily.    Refill:  5  . clonazePAM (KLONOPIN) 1 MG tablet    Sig: Take 1 tablet (1 mg total) by mouth 2 (two) times daily.    Dispense:  60 tablet    Refill:  3  . citalopram (CELEXA) 40 MG tablet    Sig: Take 1 tablet (40 mg total) by mouth daily.    Dispense:  30 tablet    Refill:  5     Follow-up: Return in about 4 months (around 01/12/2016) for a follow-up visit.  Walker Kehr, MD

## 2015-09-12 NOTE — Assessment & Plan Note (Signed)
On Klonopin, Celexa - low dose  Potential benefits of a long term benzodiazepines  use as well as potential risks  and complications were explained to the patient and were aknowledged.

## 2016-01-16 ENCOUNTER — Ambulatory Visit (INDEPENDENT_AMBULATORY_CARE_PROVIDER_SITE_OTHER): Payer: Self-pay | Admitting: Internal Medicine

## 2016-01-16 ENCOUNTER — Encounter: Payer: Self-pay | Admitting: Internal Medicine

## 2016-01-16 DIAGNOSIS — F4310 Post-traumatic stress disorder, unspecified: Secondary | ICD-10-CM

## 2016-01-16 DIAGNOSIS — F1121 Opioid dependence, in remission: Secondary | ICD-10-CM

## 2016-01-16 MED ORDER — CITALOPRAM HYDROBROMIDE 40 MG PO TABS: 40.0000 mg | ORAL_TABLET | Freq: Every day | ORAL | 5 refills | Status: DC

## 2016-01-16 MED ORDER — CLONAZEPAM 1 MG PO TABS: 1.0000 mg | ORAL_TABLET | Freq: Two times a day (BID) | ORAL | 3 refills | Status: DC

## 2016-01-16 NOTE — Progress Notes (Signed)
Subjective:  Patient ID: Jeffrey [name], male    DOB: Apr 30, 1965  Age: 50 y.o. MRN: [id number]  CC: No chief complaint on file.   HPI Brailyn Topel Nanez presents for anxiety, depression f/u  Outpatient Medications Prior to Visit  Medication Sig Dispense Refill  . atenolol (TENORMIN) 50 MG tablet Take 50 mg by mouth daily.    . Cholecalciferol (VITAMIN D3) 2000 UNITS capsule Take 1 capsule (2,000 Units total) by mouth daily. 100 capsule 3  . citalopram (CELEXA) 40 MG tablet Take 1 tablet (40 mg total) by mouth daily. 30 tablet 5  . clonazePAM (KLONOPIN) 1 MG tablet Take 1 tablet (1 mg total) by mouth 2 (two) times daily. 60 tablet 3  . diphenhydrAMINE (BENADRYL) 25 MG tablet Take 25 mg by mouth every 6 (six) hours as needed for allergies. Reported on 06/11/2015    . fluticasone (FLONASE) 50 MCG/ACT nasal spray as needed.    Marland Kitchen ibuprofen (ADVIL,MOTRIN) 200 MG tablet Take 400 mg by mouth every 6 (six) hours as needed for headache or moderate pain.    . methadone (DOLOPHINE) 10 MG/5ML solution Take 50 mLs (100 mg total) by mouth daily. Per Methadone clinic (Patient taking differently: Take 110 mg by mouth daily. Per Methadone clinic) 1 mL 0   No facility-administered medications prior to visit.     ROS Review of Systems  Constitutional: Negative for appetite change, fatigue and unexpected weight change.  HENT: Negative for congestion, nosebleeds, sneezing, sore throat and trouble swallowing.   Eyes: Negative for itching and visual disturbance.  Respiratory: Negative for cough.   Cardiovascular: Negative for chest pain, palpitations and leg swelling.  Gastrointestinal: Negative for abdominal distention, blood in stool, diarrhea and nausea.  Genitourinary: Negative for frequency and hematuria.  Musculoskeletal: Negative for back pain, gait problem, joint swelling and neck pain.  Skin: Negative for rash.  Neurological: Negative for dizziness, tremors, speech difficulty and weakness.    Psychiatric/Behavioral: Negative for agitation, dysphoric mood and sleep disturbance. The patient is nervous/anxious.     Objective:  BP 110/78   Pulse 79   Wt 237 lb (107.5 kg)   SpO2 96%   BMI 31.27 kg/m   BP Readings from Last 3 Encounters:  01/16/16 110/78  09/12/15 140/84  06/11/15 140/90    Wt Readings from Last 3 Encounters:  01/16/16 237 lb (107.5 kg)  09/12/15 236 lb (107 kg)  06/11/15 236 lb (107 kg)    Physical Exam  Constitutional: He is oriented to person, place, and time. He appears well-developed. No distress.  NAD  HENT:  Mouth/Throat: Oropharynx is clear and moist.  Eyes: Conjunctivae are normal. Pupils are equal, round, and reactive to light.  Neck: Normal range of motion. No JVD present. No thyromegaly present.  Cardiovascular: Normal rate, regular rhythm, normal heart sounds and intact distal pulses.  Exam reveals no gallop and no friction rub.   No murmur heard. Pulmonary/Chest: Effort normal and breath sounds normal. No respiratory distress. He has no wheezes. He has no rales. He exhibits no tenderness.  Abdominal: Soft. Bowel sounds are normal. He exhibits no distension and no mass. There is no tenderness. There is no rebound and no guarding.  Musculoskeletal: Normal range of motion. He exhibits no edema or tenderness.  Lymphadenopathy:    He has no cervical adenopathy.  Neurological: He is alert and oriented to person, place, and time. He has normal reflexes. No cranial nerve deficit. He exhibits normal muscle tone. He displays a  negative Romberg sign. Coordination and gait normal.  Skin: Skin is warm and dry. No rash noted.  Psychiatric: He has a normal mood and affect. His behavior is normal. Judgment and thought content normal.  Obese   No results found.  Assessment & Plan:   There are no diagnoses linked to this encounter. I am having Mr. Sconyers maintain his methadone, Vitamin D3, ibuprofen, diphenhydrAMINE, atenolol, fluticasone,  clonazePAM, and citalopram.  No orders of the defined types were placed in this encounter.    Follow-up: No Follow-up on file.  Walker Kehr, MD

## 2016-01-16 NOTE — Progress Notes (Signed)
Pre visit review using our clinic review tool, if applicable. No additional management support is needed unless otherwise documented below in the visit note. 

## 2016-01-16 NOTE — Assessment & Plan Note (Signed)
On Klonopin, Celexa  Potential benefits of a long term benzodiazepines  use as well as potential risks  and complications were explained to the patient and were aknowledged. 

## 2016-01-16 NOTE — Assessment & Plan Note (Signed)
On Methadone since 1993 - Methadone clinic 

## 2016-04-17 ENCOUNTER — Ambulatory Visit (INDEPENDENT_AMBULATORY_CARE_PROVIDER_SITE_OTHER): Payer: Self-pay | Admitting: Internal Medicine

## 2016-04-17 ENCOUNTER — Encounter: Payer: Self-pay | Admitting: Internal Medicine

## 2016-04-17 DIAGNOSIS — F4310 Post-traumatic stress disorder, unspecified: Secondary | ICD-10-CM

## 2016-04-17 DIAGNOSIS — F1121 Opioid dependence, in remission: Secondary | ICD-10-CM

## 2016-04-17 MED ORDER — CLONAZEPAM 1 MG PO TABS: 1.0000 mg | ORAL_TABLET | Freq: Two times a day (BID) | ORAL | 3 refills | Status: DC

## 2016-04-17 MED ORDER — CITALOPRAM HYDROBROMIDE 40 MG PO TABS: 40.0000 mg | ORAL_TABLET | Freq: Every day | ORAL | 3 refills | Status: DC

## 2016-04-17 NOTE — Assessment & Plan Note (Signed)
On Methadone 

## 2016-04-17 NOTE — Assessment & Plan Note (Signed)
Klonopin Celexa

## 2016-04-17 NOTE — Progress Notes (Signed)
Subjective:  Patient ID: Jeffrey [name], male    DOB: 12-24-1965  Age: 51 y.o. MRN: [id number]  CC: No chief complaint on file.   HPI Jeffrey [name] presents for anxiety f/u. Pt refused shots  Outpatient Medications Prior to Visit  Medication Sig Dispense Refill  . atenolol (TENORMIN) 50 MG tablet Take 50 mg by mouth daily.    . Cholecalciferol (VITAMIN D3) 2000 UNITS capsule Take 1 capsule (2,000 Units total) by mouth daily. 100 capsule 3  . citalopram (CELEXA) 40 MG tablet Take 1 tablet (40 mg total) by mouth daily. 30 tablet 5  . clonazePAM (KLONOPIN) 1 MG tablet Take 1 tablet (1 mg total) by mouth 2 (two) times daily. 60 tablet 3  . diphenhydrAMINE (BENADRYL) 25 MG tablet Take 25 mg by mouth every 6 (six) hours as needed for allergies. Reported on 06/11/2015    . fluticasone (FLONASE) 50 MCG/ACT nasal spray as needed.    Marland Kitchen ibuprofen (ADVIL,MOTRIN) 200 MG tablet Take 400 mg by mouth every 6 (six) hours as needed for headache or moderate pain.    . methadone (DOLOPHINE) 10 MG/5ML solution Take 50 mLs (100 mg total) by mouth daily. Per Methadone clinic (Patient taking differently: Take 110 mg by mouth daily. Per Methadone clinic) 1 mL 0   No facility-administered medications prior to visit.     ROS Review of Systems  Constitutional: Positive for unexpected weight change. Negative for appetite change and fatigue.  HENT: Negative for congestion, nosebleeds, sneezing, sore throat and trouble swallowing.   Eyes: Negative for itching and visual disturbance.  Respiratory: Negative for cough.   Cardiovascular: Negative for chest pain, palpitations and leg swelling.  Gastrointestinal: Negative for abdominal distention, blood in stool, diarrhea and nausea.  Genitourinary: Negative for frequency and hematuria.  Musculoskeletal: Negative for back pain, gait problem, joint swelling and neck pain.  Skin: Negative for rash.  Neurological: Negative for dizziness, tremors, speech difficulty  and weakness.  Psychiatric/Behavioral: Negative for agitation, dysphoric mood, sleep disturbance and suicidal ideas. The patient is nervous/anxious.     Objective:  BP 138/82   Pulse 75   Temp 98 F (36.7 C)   Wt 247 lb (112 kg)   SpO2 99%   BMI 32.59 kg/m   BP Readings from Last 3 Encounters:  04/17/16 138/82  01/16/16 110/78  09/12/15 140/84    Wt Readings from Last 3 Encounters:  04/17/16 247 lb (112 kg)  01/16/16 237 lb (107.5 kg)  09/12/15 236 lb (107 kg)    Physical Exam  Constitutional: He is oriented to person, place, and time. He appears well-developed. No distress.  NAD  HENT:  Mouth/Throat: Oropharynx is clear and moist.  Eyes: Conjunctivae are normal. Pupils are equal, round, and reactive to light.  Neck: Normal range of motion. No JVD present. No thyromegaly present.  Cardiovascular: Normal rate, regular rhythm, normal heart sounds and intact distal pulses.  Exam reveals no gallop and no friction rub.   No murmur heard. Pulmonary/Chest: Effort normal and breath sounds normal. No respiratory distress. He has no wheezes. He has no rales. He exhibits no tenderness.  Abdominal: Soft. Bowel sounds are normal. He exhibits no distension and no mass. There is no tenderness. There is no rebound and no guarding.  Musculoskeletal: Normal range of motion. He exhibits no edema or tenderness.  Lymphadenopathy:    He has no cervical adenopathy.  Neurological: He is alert and oriented to person, place, and time. He has normal reflexes.  No cranial nerve deficit. He exhibits normal muscle tone. He displays a negative Romberg sign. Coordination and gait normal.  Skin: Skin is warm and dry. No rash noted.  Psychiatric: He has a normal mood and affect. His behavior is normal. Judgment and thought content normal.  Obese   No results found.  Assessment & Plan:   There are no diagnoses linked to this encounter. I am having Jeffrey [name] maintain his methadone, Vitamin D3,  ibuprofen, diphenhydrAMINE, atenolol, fluticasone, clonazePAM, and citalopram.  No orders of the defined types were placed in this encounter.    Follow-up: No Follow-up on file.  Walker Kehr, MD

## 2016-04-17 NOTE — Progress Notes (Signed)
Pre visit review using our clinic review tool, if applicable. No additional management support is needed unless otherwise documented below in the visit note. 

## 2016-07-17 ENCOUNTER — Other Ambulatory Visit (INDEPENDENT_AMBULATORY_CARE_PROVIDER_SITE_OTHER): Payer: Self-pay

## 2016-07-17 ENCOUNTER — Ambulatory Visit (INDEPENDENT_AMBULATORY_CARE_PROVIDER_SITE_OTHER): Payer: Self-pay | Admitting: Internal Medicine

## 2016-07-17 ENCOUNTER — Encounter: Payer: Self-pay | Admitting: Internal Medicine

## 2016-07-17 DIAGNOSIS — R251 Tremor, unspecified: Secondary | ICD-10-CM

## 2016-07-17 DIAGNOSIS — F4310 Post-traumatic stress disorder, unspecified: Secondary | ICD-10-CM

## 2016-07-17 DIAGNOSIS — R253 Fasciculation: Secondary | ICD-10-CM

## 2016-07-17 DIAGNOSIS — F1121 Opioid dependence, in remission: Secondary | ICD-10-CM

## 2016-07-17 HISTORY — DX: Tremor, unspecified: R25.1

## 2016-07-17 LAB — TSH: TSH: 2.65 u[IU]/mL (ref 0.35–4.50)

## 2016-07-17 MED ORDER — CITALOPRAM HYDROBROMIDE 40 MG PO TABS: 40.0000 mg | ORAL_TABLET | Freq: Every day | ORAL | 3 refills | Status: DC

## 2016-07-17 MED ORDER — CLONAZEPAM 1 MG PO TABS: 1.0000 mg | ORAL_TABLET | Freq: Two times a day (BID) | ORAL | 3 refills | Status: DC

## 2016-07-17 NOTE — Progress Notes (Signed)
Subjective:  Patient ID: Jeffrey [name], male    DOB: [birth year]  Age: 51 y.o. MRN: [id number]  CC: No chief complaint on file.   HPI Jeffrey [name] presents for anxiety f/u. C/o tremor - worse. 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 atenolol (TENORMIN) 50 MG tablet Take 50 mg by mouth daily.    . Cholecalciferol (VITAMIN D3) 2000 UNITS capsule Take 1 capsule (2,000 Units total) by mouth daily. 100 capsule 3  . citalopram (CELEXA) 40 MG tablet Take 1 tablet (40 mg total) by mouth daily. 30 tablet 3  . clonazePAM (KLONOPIN) 1 MG tablet Take 1 tablet (1 mg total) by mouth 2 (two) times daily. 60 tablet 3  . diphenhydrAMINE (BENADRYL) 25 MG tablet Take 25 mg by mouth every 6 (six) hours as needed for allergies. Reported on 06/11/2015    . ibuprofen (ADVIL,MOTRIN) 200 MG tablet Take 400 mg by mouth every 6 (six) hours as needed for headache or moderate pain.    . methadone (DOLOPHINE) 10 MG/5ML solution Take 50 mLs (100 mg total) by mouth daily. Per Methadone clinic 1 mL 0  . fluticasone (FLONASE) 50 MCG/ACT nasal spray as needed.     No facility-administered medications prior to visit.     ROS Review of Systems  Constitutional: Negative for appetite change, fatigue and unexpected weight change.  HENT: Negative for congestion, nosebleeds, sneezing, sore throat and trouble swallowing.   Eyes: Negative for itching and visual disturbance.  Respiratory: Negative for cough.   Cardiovascular: Negative for chest pain, palpitations and leg swelling.  Gastrointestinal: Negative for abdominal distention, blood in stool, diarrhea and nausea.  Genitourinary: Negative for frequency and hematuria.  Musculoskeletal: Negative for back pain, gait problem, joint swelling and neck pain.  Skin: Negative for rash.  Neurological: Positive for tremors. Negative for dizziness, speech difficulty and weakness.    Psychiatric/Behavioral: Negative for agitation, dysphoric mood, sleep disturbance and suicidal ideas. The patient is nervous/anxious.   L>R hand tremor L leg shaking  Objective:  BP 124/70 (BP Location: Left Arm, Patient Position: Sitting, Cuff Size: Large)   Pulse 74   Temp 98.3 F (36.8 C) (Oral)   Ht 6\' 1"  (1.854 m)   Wt 252 lb (114.3 kg)   SpO2 99%   BMI 33.25 kg/m   BP Readings from Last 3 Encounters:  07/17/16 124/70  04/17/16 138/82  01/16/16 110/78    Wt Readings from Last 3 Encounters:  07/17/16 252 lb (114.3 kg)  04/17/16 247 lb (112 kg)  01/16/16 237 lb (107.5 kg)    Physical Exam  Constitutional: He is oriented to person, place, and time. He appears well-developed. No distress.  NAD  HENT:  Mouth/Throat: Oropharynx is clear and moist.  Eyes: Conjunctivae are normal. Pupils are equal, round, and reactive to light.  Neck: Normal range of motion. No JVD present. No thyromegaly present.  Cardiovascular: Normal rate, regular rhythm, normal heart sounds and intact distal pulses.  Exam reveals no gallop and no friction rub.   No murmur heard. Pulmonary/Chest: Effort normal and breath sounds normal. No respiratory distress. He has no wheezes. He has no rales. He exhibits no tenderness.  Abdominal: Soft. Bowel sounds are normal. He exhibits no distension and no mass. There is no tenderness. There is no rebound and no guarding.  Musculoskeletal: Normal range of motion. He exhibits no edema or tenderness.  Lymphadenopathy:  He has no cervical adenopathy.  Neurological: He is alert and oriented to person, place, and time. He has normal reflexes. No cranial nerve deficit. He exhibits normal muscle tone. He displays a negative Romberg sign. Coordination abnormal. Gait normal.  Skin: Skin is warm and dry. No rash noted.  Psychiatric: He has a normal mood and affect. His behavior is normal. Judgment and thought content normal.  L>R hand tremor L leg is  shaking Obese   No results found.  Assessment & Plan:   There are no diagnoses linked to this encounter. I am having [name] maintain his methadone, Vitamin D3, ibuprofen, diphenhydrAMINE, atenolol, fluticasone, citalopram, and clonazePAM.  No orders of the defined types were placed in this encounter.    Follow-up: No Follow-up on file.  Walker Kehr, MD

## 2016-07-17 NOTE — Assessment & Plan Note (Signed)
Face, lips, hands- chronic ?etiology Worse: will obtain a neurology consult

## 2016-07-17 NOTE — Progress Notes (Signed)
Pre visit review using our clinic review tool, if applicable. No additional management support is needed unless otherwise documented below in the visit note. 

## 2016-07-17 NOTE — Assessment & Plan Note (Signed)
?  etiology Worse: will obtain a neurology consult

## 2016-07-17 NOTE — Assessment & Plan Note (Signed)
On Methadone 

## 2016-07-17 NOTE — Assessment & Plan Note (Signed)
On Klonopin, Celexa  Potential benefits of a long term benzodiazepines  use as well as potential risks  and complications were explained to the patient and were aknowledged. 

## 2016-08-08 ENCOUNTER — Encounter: Payer: Self-pay | Admitting: Internal Medicine

## 2016-10-16 ENCOUNTER — Ambulatory Visit: Payer: Self-pay | Admitting: Internal Medicine

## 2016-10-29 ENCOUNTER — Ambulatory Visit (INDEPENDENT_AMBULATORY_CARE_PROVIDER_SITE_OTHER): Payer: Self-pay | Admitting: Internal Medicine

## 2016-10-29 ENCOUNTER — Encounter: Payer: Self-pay | Admitting: Internal Medicine

## 2016-10-29 DIAGNOSIS — H6120 Impacted cerumen, unspecified ear: Secondary | ICD-10-CM

## 2016-10-29 DIAGNOSIS — J0100 Acute maxillary sinusitis, unspecified: Secondary | ICD-10-CM

## 2016-10-29 DIAGNOSIS — J019 Acute sinusitis, unspecified: Secondary | ICD-10-CM

## 2016-10-29 DIAGNOSIS — H6123 Impacted cerumen, bilateral: Secondary | ICD-10-CM

## 2016-10-29 DIAGNOSIS — F4310 Post-traumatic stress disorder, unspecified: Secondary | ICD-10-CM

## 2016-10-29 HISTORY — DX: Impacted cerumen, unspecified ear: H61.20

## 2016-10-29 HISTORY — DX: Acute sinusitis, unspecified: J01.90

## 2016-10-29 MED ORDER — AZITHROMYCIN 250 MG PO TABS: ORAL_TABLET | ORAL | 0 refills | Status: DC

## 2016-10-29 MED ORDER — CITALOPRAM HYDROBROMIDE 40 MG PO TABS: 40.0000 mg | ORAL_TABLET | Freq: Every day | ORAL | 3 refills | Status: DC

## 2016-10-29 MED ORDER — DOXYCYCLINE HYCLATE 100 MG PO TABS: 100.0000 mg | ORAL_TABLET | Freq: Two times a day (BID) | ORAL | 0 refills | Status: DC

## 2016-10-29 MED ORDER — CLONAZEPAM 1 MG PO TABS: 1.0000 mg | ORAL_TABLET | Freq: Two times a day (BID) | ORAL | 3 refills | Status: DC

## 2016-10-29 NOTE — Progress Notes (Signed)
Subjective:  Patient ID: Jeffrey [name], male    DOB: May 17, 1965  Age: 51 y.o. MRN: [id number]  CC: No chief complaint on file.   HPI Quashawn Jewkes Gintz presents for anxiety f/u C/o R  Ear pain and yellow sinus d/c  Outpatient Medications Prior to Visit  Medication Sig Dispense Refill  . atenolol (TENORMIN) 50 MG tablet Take 50 mg by mouth daily.    . Cholecalciferol (VITAMIN D3) 2000 UNITS capsule Take 1 capsule (2,000 Units total) by mouth daily. 100 capsule 3  . citalopram (CELEXA) 40 MG tablet Take 1 tablet (40 mg total) by mouth daily. 30 tablet 3  . clonazePAM (KLONOPIN) 1 MG tablet Take 1 tablet (1 mg total) by mouth 2 (two) times daily. 60 tablet 3  . diphenhydrAMINE (BENADRYL) 25 MG tablet Take 25 mg by mouth every 6 (six) hours as needed for allergies. Reported on 06/11/2015    . ibuprofen (ADVIL,MOTRIN) 200 MG tablet Take 400 mg by mouth every 6 (six) hours as needed for headache or moderate pain.    . methadone (DOLOPHINE) 10 MG/5ML solution Take 50 mLs (100 mg total) by mouth daily. Per Methadone clinic (Patient taking differently: Take 120 mg by mouth daily. Per Methadone clinic) 1 mL 0  . fluticasone (FLONASE) 50 MCG/ACT nasal spray as needed.     No facility-administered medications prior to visit.     ROS Review of Systems  Constitutional: Positive for fatigue.  HENT: Positive for congestion, postnasal drip, rhinorrhea, sinus pain and sinus pressure.   Neurological: Positive for headaches.  Psychiatric/Behavioral: The patient is nervous/anxious.     Objective:  BP 122/76 (BP Location: Left Arm, Patient Position: Sitting, Cuff Size: Large)   Pulse 75   Temp 98.3 F (36.8 C) (Oral)   Ht 6\' 1"  (1.854 m)   Wt 248 lb (112.5 kg)   SpO2 98%   BMI 32.72 kg/m   BP Readings from Last 3 Encounters:  10/29/16 122/76  07/17/16 124/70  04/17/16 138/82    Wt Readings from Last 3 Encounters:  10/29/16 248 lb (112.5 kg)  07/17/16 252 lb (114.3 kg)  04/17/16 247  lb (112 kg)    Physical Exam  Constitutional: He is oriented to person, place, and time. He appears well-developed. No distress.  NAD  HENT:  Mouth/Throat: Oropharynx is clear and moist.  Eyes: Pupils are equal, round, and reactive to light. Conjunctivae are normal.  Neck: Normal range of motion. No JVD present. No thyromegaly present.  Cardiovascular: Normal rate, regular rhythm, normal heart sounds and intact distal pulses.  Exam reveals no gallop and no friction rub.   No murmur heard. Pulmonary/Chest: Effort normal and breath sounds normal. No respiratory distress. He has no wheezes. He has no rales. He exhibits no tenderness.  Abdominal: Soft. Bowel sounds are normal. He exhibits no distension and no mass. There is no tenderness. There is no rebound and no guarding.  Musculoskeletal: Normal range of motion. He exhibits no edema or tenderness.  Lymphadenopathy:    He has no cervical adenopathy.  Neurological: He is alert and oriented to person, place, and time. He has normal reflexes. No cranial nerve deficit. He exhibits normal muscle tone. He displays a negative Romberg sign. Coordination and gait normal.  Skin: Skin is warm and dry. No rash noted.  Psychiatric: He has a normal mood and affect. His behavior is normal. Judgment and thought content normal.  eryth nares B wax  Lab Results  Component Value Date  WBC 7.0 02/21/2015   HGB 12.9 (L) 02/21/2015   HCT 37.8 (L) 02/21/2015   PLT 175 02/21/2015   GLUCOSE 91 02/21/2015   CHOL 168 09/28/2009   TRIG 218 (H) 09/28/2009   HDL 32 (L) 09/28/2009   LDLCALC 92 09/28/2009   ALT 24 09/28/2009   AST 27 09/28/2009   NA 139 02/21/2015   K 4.0 02/21/2015   CL 104 02/21/2015   CREATININE 1.15 02/21/2015   BUN 17 02/21/2015   CO2 29 02/21/2015   TSH 2.65 07/17/2016   PSA 0.19 06/16/2008   INR 1.0 12/13/2008   HGBA1C  12/13/2008    5.8 (NOTE) The ADA recommends the following therapeutic goal for glycemic control related to Hgb  A1c measurement: Goal of therapy: <6.5 Hgb A1c  Reference: American Diabetes Association: Clinical Practice Recommendations 2010, Diabetes Care, 2010, 33: (Suppl  1).   MICROALBUR 1.03 06/06/2009    No results found.  Assessment & Plan:   There are no diagnoses linked to this encounter. I am having Mr. [name] maintain his methadone, Vitamin D3, ibuprofen, diphenhydrAMINE, atenolol, fluticasone, clonazePAM, and citalopram.  No orders of the defined types were placed in this encounter.    Follow-up: No Follow-up on file.  Walker Kehr, MD

## 2016-10-29 NOTE — Assessment & Plan Note (Signed)
Zpac 

## 2016-10-29 NOTE — Addendum Note (Signed)
Addended by: Karren Cobble on: 10/29/2016 11:45 AM   Modules accepted: Orders

## 2016-10-29 NOTE — Assessment & Plan Note (Signed)
Given a kit to irrigate ears at home

## 2016-10-29 NOTE — Assessment & Plan Note (Signed)
On Klonopin, Celexa  Potential benefits of a long term benzodiazepines  use as well as potential risks  and complications were explained to the patient and were aknowledged. 

## 2017-01-30 ENCOUNTER — Ambulatory Visit (INDEPENDENT_AMBULATORY_CARE_PROVIDER_SITE_OTHER): Payer: Self-pay | Admitting: Internal Medicine

## 2017-01-30 ENCOUNTER — Encounter: Payer: Self-pay | Admitting: Internal Medicine

## 2017-01-30 DIAGNOSIS — F4310 Post-traumatic stress disorder, unspecified: Secondary | ICD-10-CM

## 2017-01-30 MED ORDER — CLONAZEPAM 1 MG PO TABS: 1.0000 mg | ORAL_TABLET | Freq: Two times a day (BID) | ORAL | 2 refills | Status: DC

## 2017-01-30 NOTE — Assessment & Plan Note (Signed)
On Klonopin, Celexa  Potential benefits of a long term benzodiazepines  use as well as potential risks  and complications were explained to the patient and were aknowledged. 

## 2017-01-30 NOTE — Progress Notes (Signed)
Subjective:  Patient ID: Jeffrey [name], male    DOB: 1966/01/26  Age: 51 y.o. MRN: [id number]  CC: No chief complaint on file.   HPI Jeffrey [name] presents for PTSD/anxiety f/u  Outpatient Medications Prior to Visit  Medication Sig Dispense Refill  . atenolol (TENORMIN) 50 MG tablet Take 50 mg by mouth daily.    . Cholecalciferol (VITAMIN D3) 2000 UNITS capsule Take 1 capsule (2,000 Units total) by mouth daily. 100 capsule 3  . citalopram (CELEXA) 40 MG tablet Take 1 tablet (40 mg total) by mouth daily. 30 tablet 3  . clonazePAM (KLONOPIN) 1 MG tablet Take 1 tablet (1 mg total) by mouth 2 (two) times daily. 60 tablet 3  . diphenhydrAMINE (BENADRYL) 25 MG tablet Take 25 mg by mouth every 6 (six) hours as needed for allergies. Reported on 06/11/2015    . doxycycline (VIBRA-TABS) 100 MG tablet Take 1 tablet (100 mg total) by mouth 2 (two) times daily. 14 tablet 0  . fluticasone (FLONASE) 50 MCG/ACT nasal spray as needed.    Marland Kitchen ibuprofen (ADVIL,MOTRIN) 200 MG tablet Take 400 mg by mouth every 6 (six) hours as needed for headache or moderate pain.    . methadone (DOLOPHINE) 10 MG/5ML solution Take 50 mLs (100 mg total) by mouth daily. Per Methadone clinic (Patient taking differently: Take 120 mg by mouth daily. Per Methadone clinic) 1 mL 0   No facility-administered medications prior to visit.     ROS Review of Systems  Constitutional: Negative for appetite change, fatigue and unexpected weight change.  HENT: Negative for congestion, nosebleeds, sneezing, sore throat and trouble swallowing.   Eyes: Negative for itching and visual disturbance.  Respiratory: Negative for cough.   Cardiovascular: Negative for chest pain, palpitations and leg swelling.  Gastrointestinal: Negative for abdominal distention, blood in stool, diarrhea and nausea.  Genitourinary: Negative for frequency and hematuria.  Musculoskeletal: Positive for arthralgias. Negative for back pain, gait problem, joint  swelling and neck pain.  Skin: Negative for rash.  Neurological: Negative for dizziness, tremors, speech difficulty and weakness.  Psychiatric/Behavioral: Negative for agitation, dysphoric mood, sleep disturbance and suicidal ideas. The patient is nervous/anxious.     Objective:  BP 124/82 (BP Location: Left Arm, Patient Position: Sitting, Cuff Size: Large)   Pulse 75   Temp (!) 97.4 F (36.3 C) (Oral)   Ht 6\' 1"  (1.854 m)   Wt 246 lb (111.6 kg)   SpO2 99%   BMI 32.46 kg/m   BP Readings from Last 3 Encounters:  01/30/17 124/82  10/29/16 122/76  07/17/16 124/70    Wt Readings from Last 3 Encounters:  01/30/17 246 lb (111.6 kg)  10/29/16 248 lb (112.5 kg)  07/17/16 252 lb (114.3 kg)    Physical Exam  Constitutional: He is oriented to person, place, and time. He appears well-developed. No distress.  NAD  HENT:  Mouth/Throat: Oropharynx is clear and moist.  Eyes: Conjunctivae are normal. Pupils are equal, round, and reactive to light.  Neck: Normal range of motion. No JVD present. No thyromegaly present.  Cardiovascular: Normal rate, regular rhythm, normal heart sounds and intact distal pulses. Exam reveals no gallop and no friction rub.  No murmur heard. Pulmonary/Chest: Effort normal and breath sounds normal. No respiratory distress. He has no wheezes. He has no rales. He exhibits no tenderness.  Abdominal: Soft. Bowel sounds are normal. He exhibits no distension and no mass. There is no tenderness. There is no rebound and no guarding.  Musculoskeletal: Normal range of motion. He exhibits no edema or tenderness.  Lymphadenopathy:    He has no cervical adenopathy.  Neurological: He is alert and oriented to person, place, and time. He has normal reflexes. No cranial nerve deficit. He exhibits normal muscle tone. He displays a negative Romberg sign. Coordination and gait normal.  Skin: Skin is warm and dry. No rash noted.  Psychiatric: He has a normal mood and affect. His  behavior is normal. Judgment and thought content normal.    Lab Results  Component Value Date   WBC 7.0 02/21/2015   HGB 12.9 (L) 02/21/2015   HCT 37.8 (L) 02/21/2015   PLT 175 02/21/2015   GLUCOSE 91 02/21/2015   CHOL 168 09/28/2009   TRIG 218 (H) 09/28/2009   HDL 32 (L) 09/28/2009   LDLCALC 92 09/28/2009   ALT 24 09/28/2009   AST 27 09/28/2009   NA 139 02/21/2015   K 4.0 02/21/2015   CL 104 02/21/2015   CREATININE 1.15 02/21/2015   BUN 17 02/21/2015   CO2 29 02/21/2015   TSH 2.65 07/17/2016   PSA 0.19 06/16/2008   INR 1.0 12/13/2008   HGBA1C  12/13/2008    5.8 (NOTE) The ADA recommends the following therapeutic goal for glycemic control related to Hgb A1c measurement: Goal of therapy: <6.5 Hgb A1c  Reference: American Diabetes Association: Clinical Practice Recommendations 2010, Diabetes Care, 2010, 33: (Suppl  1).   MICROALBUR 1.03 06/06/2009    No results found.  Assessment & Plan:   There are no diagnoses linked to this encounter. I am having Jeffrey [name] maintain his methadone, Vitamin D3, ibuprofen, diphenhydrAMINE, atenolol, fluticasone, clonazePAM, citalopram, and doxycycline.  No orders of the defined types were placed in this encounter.    Follow-up: No Follow-up on file.  Walker Kehr, MD

## 2017-05-05 ENCOUNTER — Encounter: Payer: Self-pay | Admitting: Internal Medicine

## 2017-05-05 ENCOUNTER — Ambulatory Visit (INDEPENDENT_AMBULATORY_CARE_PROVIDER_SITE_OTHER): Payer: Self-pay | Admitting: Internal Medicine

## 2017-05-05 DIAGNOSIS — F4310 Post-traumatic stress disorder, unspecified: Secondary | ICD-10-CM

## 2017-05-05 DIAGNOSIS — F1121 Opioid dependence, in remission: Secondary | ICD-10-CM

## 2017-05-05 MED ORDER — CLONAZEPAM 1 MG PO TABS: 1.0000 mg | ORAL_TABLET | Freq: Two times a day (BID) | ORAL | 2 refills | Status: DC

## 2017-05-05 MED ORDER — CITALOPRAM HYDROBROMIDE 40 MG PO TABS: 40.0000 mg | ORAL_TABLET | Freq: Every day | ORAL | 5 refills | Status: DC

## 2017-05-05 NOTE — Assessment & Plan Note (Signed)
On Methadone since 1993 - Methadone clinic 

## 2017-05-05 NOTE — Progress Notes (Signed)
Subjective:  Patient ID: Jeffrey [name], male    DOB: 12-13-1965  Age: 52 y.o. MRN: [id number]  CC: No chief complaint on file.   HPI Jeffrey [name] presents for chronic anxiety f/u  Outpatient Medications Prior to Visit  Medication Sig Dispense Refill  . atenolol (TENORMIN) 50 MG tablet Take 50 mg by mouth daily.    . Cholecalciferol (VITAMIN D3) 2000 UNITS capsule Take 1 capsule (2,000 Units total) by mouth daily. 100 capsule 3  . citalopram (CELEXA) 40 MG tablet Take 1 tablet (40 mg total) by mouth daily. 30 tablet 3  . clonazePAM (KLONOPIN) 1 MG tablet Take 1 tablet (1 mg total) 2 (two) times daily by mouth. 60 tablet 2  . diphenhydrAMINE (BENADRYL) 25 MG tablet Take 25 mg by mouth every 6 (six) hours as needed for allergies. Reported on 06/11/2015    . doxycycline (VIBRA-TABS) 100 MG tablet Take 1 tablet (100 mg total) by mouth 2 (two) times daily. 14 tablet 0  . ibuprofen (ADVIL,MOTRIN) 200 MG tablet Take 400 mg by mouth every 6 (six) hours as needed for headache or moderate pain.    . methadone (DOLOPHINE) 10 MG/5ML solution Take 50 mLs (100 mg total) by mouth daily. Per Methadone clinic (Patient taking differently: Take 120 mg by mouth daily. Per Methadone clinic) 1 mL 0  . fluticasone (FLONASE) 50 MCG/ACT nasal spray as needed.     No facility-administered medications prior to visit.     ROS Review of Systems  Constitutional: Negative for appetite change, fatigue and unexpected weight change.  HENT: Negative for congestion, nosebleeds, sneezing, sore throat and trouble swallowing.   Eyes: Negative for itching and visual disturbance.  Respiratory: Negative for cough.   Cardiovascular: Negative for chest pain, palpitations and leg swelling.  Gastrointestinal: Negative for abdominal distention, blood in stool, diarrhea and nausea.  Genitourinary: Negative for frequency and hematuria.  Musculoskeletal: Negative for back pain, gait problem, joint swelling and neck pain.    Skin: Negative for rash.  Neurological: Negative for dizziness, tremors, speech difficulty and weakness.  Psychiatric/Behavioral: Positive for dysphoric mood. Negative for agitation, sleep disturbance and suicidal ideas. The patient is nervous/anxious.    Obese R leg tremor  Objective:  BP 128/82   Pulse 74   Temp 98.1 F (36.7 C)   Wt 250 lb (113.4 kg)   SpO2 98%   BMI 32.98 kg/m   BP Readings from Last 3 Encounters:  05/05/17 128/82  01/30/17 124/82  10/29/16 122/76    Wt Readings from Last 3 Encounters:  05/05/17 250 lb (113.4 kg)  01/30/17 246 lb (111.6 kg)  10/29/16 248 lb (112.5 kg)    Physical Exam  Constitutional: He is oriented to person, place, and time. He appears well-developed. No distress.  NAD  HENT:  Mouth/Throat: Oropharynx is clear and moist.  Eyes: Conjunctivae are normal. Pupils are equal, round, and reactive to light.  Neck: Normal range of motion. No JVD present. No thyromegaly present.  Cardiovascular: Normal rate, regular rhythm, normal heart sounds and intact distal pulses. Exam reveals no gallop and no friction rub.  No murmur heard. Pulmonary/Chest: Effort normal and breath sounds normal. No respiratory distress. He has no wheezes. He has no rales. He exhibits no tenderness.  Abdominal: Soft. Bowel sounds are normal. He exhibits no distension and no mass. There is no tenderness. There is no rebound and no guarding.  Musculoskeletal: Normal range of motion. He exhibits no edema or tenderness.  Lymphadenopathy:  He has no cervical adenopathy.  Neurological: He is alert and oriented to person, place, and time. He has normal reflexes. No cranial nerve deficit. He exhibits normal muscle tone. He displays a negative Romberg sign. Coordination and gait normal.  Skin: Skin is warm and dry. No rash noted.  Psychiatric: His behavior is normal. Judgment and thought content normal.    Lab Results  Component Value Date   WBC 7.0 02/21/2015   HGB  12.9 (L) 02/21/2015   HCT 37.8 (L) 02/21/2015   PLT 175 02/21/2015   GLUCOSE 91 02/21/2015   CHOL 168 09/28/2009   TRIG 218 (H) 09/28/2009   HDL 32 (L) 09/28/2009   LDLCALC 92 09/28/2009   ALT 24 09/28/2009   AST 27 09/28/2009   NA 139 02/21/2015   K 4.0 02/21/2015   CL 104 02/21/2015   CREATININE 1.15 02/21/2015   BUN 17 02/21/2015   CO2 29 02/21/2015   TSH 2.65 07/17/2016   PSA 0.19 06/16/2008   INR 1.0 12/13/2008   HGBA1C  12/13/2008    5.8 (NOTE) The ADA recommends the following therapeutic goal for glycemic control related to Hgb A1c measurement: Goal of therapy: <6.5 Hgb A1c  Reference: American Diabetes Association: Clinical Practice Recommendations 2010, Diabetes Care, 2010, 33: (Suppl  1).   MICROALBUR 1.03 06/06/2009    No results found.  Assessment & Plan:   There are no diagnoses linked to this encounter. I am having Jeffrey [name] maintain his methadone, Vitamin D3, ibuprofen, diphenhydrAMINE, atenolol, fluticasone, citalopram, doxycycline, and clonazePAM.  No orders of the defined types were placed in this encounter.    Follow-up: No Follow-up on file.  Walker Kehr, MD

## 2017-05-05 NOTE — Assessment & Plan Note (Signed)
On Klonopin, Celexa  Potential benefits of a long term benzodiazepines  use as well as potential risks  and complications were explained to the patient and were aknowledged. 

## 2017-07-11 ENCOUNTER — Emergency Department (HOSPITAL_COMMUNITY): Payer: Self-pay

## 2017-07-11 ENCOUNTER — Other Ambulatory Visit: Payer: Self-pay

## 2017-07-11 ENCOUNTER — Emergency Department (HOSPITAL_COMMUNITY)
Admission: EM | Admit: 2017-07-11 | Discharge: 2017-07-11 | Disposition: A | Discharge status: Home / Self Care | Payer: Self-pay | Attending: Emergency Medicine | Admitting: Emergency Medicine

## 2017-07-11 ENCOUNTER — Encounter (HOSPITAL_COMMUNITY): Payer: Self-pay | Admitting: *Deleted

## 2017-07-11 DIAGNOSIS — R002 Palpitations: Secondary | ICD-10-CM

## 2017-07-11 DIAGNOSIS — R079 Chest pain, unspecified: Secondary | ICD-10-CM

## 2017-07-11 DIAGNOSIS — Z79899 Other long term (current) drug therapy: Secondary | ICD-10-CM | POA: Insufficient documentation

## 2017-07-11 DIAGNOSIS — Z45010 Encounter for checking and testing of cardiac pacemaker pulse generator [battery]: Secondary | ICD-10-CM

## 2017-07-11 LAB — I-STAT TROPONIN, ED
Troponin i, poc: 0 ng/mL (ref 0.00–0.08)
Troponin i, poc: 0 ng/mL (ref 0.00–0.08)

## 2017-07-11 LAB — CBC
HEMATOCRIT: 40.7 % (ref 39.0–52.0)
HEMOGLOBIN: 13.9 g/dL (ref 13.0–17.0)
MCH: 29.3 pg (ref 26.0–34.0)
MCHC: 34.2 g/dL (ref 30.0–36.0)
MCV: 85.7 fL (ref 78.0–100.0)
Platelets: 217 10*3/uL (ref 150–400)
RBC: 4.75 MIL/uL (ref 4.22–5.81)
RDW: 12.3 % (ref 11.5–15.5)
WBC: 8.3 10*3/uL (ref 4.0–10.5)

## 2017-07-11 LAB — BASIC METABOLIC PANEL
ANION GAP: 10 (ref 5–15)
BUN: 11 mg/dL (ref 6–20)
CO2: 25 mmol/L (ref 22–32)
Calcium: 8.3 mg/dL — ABNORMAL LOW (ref 8.9–10.3)
Chloride: 103 mmol/L (ref 101–111)
Creatinine, Ser: 1.16 mg/dL (ref 0.61–1.24)
GFR calc Af Amer: >60 mL/min (ref >60)
GFR calc non Af Amer: >60 mL/min (ref >60)
GLUCOSE: 105 mg/dL — AB (ref 65–99)
POTASSIUM: 3.8 mmol/L (ref 3.5–5.1)
Sodium: 138 mmol/L (ref 135–145)

## 2017-07-11 MED ORDER — CLONAZEPAM 0.5 MG PO TABS
1.0000 mg | ORAL_TABLET | Freq: Once | ORAL | Status: AC
  Administered 2017-07-11: 1 mg via ORAL
  Filled 2017-07-11: qty 2

## 2017-07-11 NOTE — ED Triage Notes (Signed)
2-3 days of feeling bad with tingling in left arm and shob. Pt has pacemaker, pain 6/10 at present time

## 2017-07-11 NOTE — ED Provider Notes (Signed)
[facility] DEPT Provider Note   CSN: [id number] Arrival date & time: 07/11/17  5427     History   Chief Complaint Chief Complaint  Patient presents with  . Chest Pain    HPI Jeffrey [name] is a 52 y.o. male.  HPI Has medical history significant for [employer] pacemaker (DDDR) 2006.  EMR indicates last device check was in 2014 at [place] and at that time battery life was > 2 years.  Patient states that over the past several days he has noticed intermittent chest tightness.  It does not seem to relate specifically to activity.  It can occur at rest.  Reports he also has noted some tingling in his left arm.  The symptoms have been more pronounced over about 2 days duration.  No syncope or near syncope.  Patient does however report at times he perceives his heart to be skipping or racing.  No recent fevers, chills, significant cough.  He reports sometimes he does have dry cough but this is not been associated fever or chest pain.  No lower extremity swelling or calf pain. Patient reports he does not think his "neurological problems" are associated with his chest tightness symptoms.  Patient reports he has had a long-standing problems with auditory hallucinations.  He has been treated by his primary care doctor with methadone for chronic pain and citalopram for anxiety\PTSD for long duration.  He reports that his doctor is trying to make arrangements for him to be seen by neurology again but he does have insurance problems that have impeded those arrangements. Past Medical History:  Diagnosis Date  . Anxiety   . Pacemaker    2006 St[employer] Jude, Identity [address] [phone], serial # [id number]. St Jude rep must physically interrogate pacemaker.   . Substance abuse ([place])    opioids    Patient Active Problem List   Diagnosis Date Noted  . Cerumen impaction 10/29/2016  . Acute sinusitis 10/29/2016  . Tremor 07/17/2016  . Skin abscess 03/09/2015  . Muscular  fasciculation 03/09/2015  . 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 PTSD (post-traumatic stress disorder) 12/19/2014  . Opioid dependence ([place]) 12/19/2014    Past Surgical History:  Procedure Laterality Date  . HERNIA REPAIR Right   . LIVER SURGERY          Home Medications    Prior to Admission medications   Medication Sig Start Date End Date Taking? Authorizing Provider  atenolol (TENORMIN) 50 MG tablet Take 50 mg by mouth daily.   Yes [provider]  Cholecalciferol (VITAMIN D3) 2000 UNITS capsule Take 1 capsule (2,000 Units total) by mouth daily. 12/19/14  Yes Plotnikov, Evie Lacks, MD  citalopram (CELEXA) 40 MG tablet Take 1 tablet (40 mg total) by mouth daily. 05/05/17  Yes Plotnikov, Evie Lacks, MD  clonazePAM (KLONOPIN) 1 MG tablet Take 1 tablet (1 mg total) by mouth 2 (two) times daily. 05/05/17  Yes Plotnikov, Evie Lacks, MD  diphenhydrAMINE (BENADRYL) 25 MG tablet Take 25 mg by mouth every 6 (six) hours as needed for allergies. Reported on 06/11/2015   Yes [provider]  ibuprofen (ADVIL,MOTRIN) 200 MG tablet Take 400 mg by mouth every 6 (six) hours as needed for headache or moderate pain.   Yes [provider]  methadone (DOLOPHINE) 10 MG/5ML solution Take 50 mLs (100 mg total) by mouth daily. Per Methadone clinic Patient taking differently: Take 130 mg by mouth daily. Per Methadone clinic 12/19/14  Yes Plotnikov, Tyrone Apple  V, MD  doxycycline (VIBRA-TABS) 100 MG tablet Take 1 tablet (100 mg total) by mouth 2 (two) times daily. 10/29/16   Plotnikov, Evie Lacks, MD    Family History Family History  Problem Relation Age of Onset  . Depression Mother   . Diabetes Mother   . Arthritis Mother   . Arthritis Father     Social History Social History   Tobacco Use  . Smoking status: Never Smoker  . Smokeless tobacco: Current User    Types: Chew  Substance Use Topics  . Alcohol use: No    Alcohol/week: 0.0 oz  . Drug use: No    Comment: methadone clinic      Allergies   Codeine and Penicillins   Review of Systems Review of Systems 10 Systems reviewed and are negative for acute change except as noted in the HPI.  Physical Exam Updated Vital Signs BP 120/82   Pulse (!) 58   Temp 98 F (36.7 C) (Oral)   Resp 13   Ht 6\' 1"  (1.854 m)   Wt 113.4 kg (250 lb)   SpO2 97%   BMI 32.98 kg/m   Physical Exam  Constitutional: He is oriented to person, place, and time. He appears well-developed and well-nourished. No distress.  HENT:  Head: Normocephalic and atraumatic.  Nose: Nose normal.  Patient illustrates some repetitive tongue movement and perioral tremor.  Airway is widely patent.  No difficulty with speech production.  No abnormal quality to voice.  Eyes: Pupils are equal, round, and reactive to light. EOM are normal.  Neck: Neck supple.  Cardiovascular: Normal rate, regular rhythm, normal heart sounds and intact distal pulses.  Pulmonary/Chest: Effort normal and breath sounds normal.  Abdominal: Soft. Bowel sounds are normal. He exhibits no distension. There is no tenderness.  Musculoskeletal: Normal range of motion. He exhibits no edema.  No peripheral edema.  Bilateral symmetric pulses.  Neurological: He is alert and oriented to person, place, and time. He has normal strength. He exhibits normal muscle tone. Coordination normal. GCS eye subscore is 4. GCS verbal subscore is 5. GCS motor subscore is 6.  Patient illustrates some tremor to the left hand.  He reports this is always isolated to the left side.  No weakness.  No pronator drift.  Skin: Skin is warm, dry and intact.  Psychiatric: He has a normal mood and affect.     ED Treatments / Results  Labs (all labs ordered are listed, but only abnormal results are displayed) Labs Reviewed  BASIC METABOLIC PANEL - Abnormal; Notable for the following components:      Result Value   Glucose, Bld 105 (*)    Calcium 8.3 (*)    All other components within normal limits  CBC   I-STAT TROPONIN, ED  I-STAT TROPONIN, ED    EKG EKG Interpretation  Date/Time:  Saturday July 11 2017 07:46:33 EDT Ventricular Rate:  71 PR Interval:    QRS Duration: 114 QT Interval:  401 QTC Calculation: 436 R Axis:   -71 Text Interpretation:  Sinus rhythm Left anterior fascicular block Low voltage, precordial leads Abnormal R-wave progression, late transition agree. no acute ischemic change. c/w nonpaced,  Confirmed by Charlesetta Shanks [phone]) on 07/11/2017 8:29:49 AM   Radiology Dg Chest 2 View  Result Date: 07/11/2017 CLINICAL DATA:  Chest pain and tingling. Shortness of breath and tingling in the left arm. Symptoms for 2-3 days. EXAM: CHEST - 2 VIEW COMPARISON:  PA and lateral chest 02/21/2015. FINDINGS: Pacing  device is unchanged. Lungs are clear. Heart size is normal. No pneumothorax or pleural effusion. No bony abnormality. IMPRESSION: No acute disease. Electronically Signed   By: Inge Rise M.D.   On: 07/11/2017 08:33    Procedures Procedures (including critical care time)  Medications Ordered in ED Medications  clonazePAM (KLONOPIN) tablet 1 mg (1 mg Oral Given 07/11/17 1038)     Initial Impression / Assessment and Plan / ED Course  I have reviewed the triage vital signs and the nursing notes.  Pertinent labs & imaging results that were available during my care of the patient were reviewed by me and considered in my medical decision making (see chart for details).      Final Clinical Impressions(s) / ED Diagnoses   Final diagnoses:  Chest pain, unspecified type  Palpitations  Pacemaker battery depletion   Pacemaker was interrogated.  It is at the end of his battery life.  Pacemaker support technician made adjustments to settings to conserve battery life.  Patient has native sinus rhythm in the 60s.  No signs of acute ischemia at this time.  2 sets of troponin are negative.  EKGs do not suggest ischemic changes.  Patient is counseled on signs and  symptoms for which to return.  He reports he will contact cardiology on Monday.  Patient has had chronic and long-standing problems with tremor and auditory hallucinations.  Patient is stable.  No signs of active psychosis or acute neurologic dysfunction.  This is being managed by his PCP with referral to neurology.  Patient is comfortable with his plan is that is. ED Discharge Orders    None       Charlesetta Shanks, MD 07/11/17 1230

## 2017-07-11 NOTE — Discharge Instructions (Addendum)
1.  Call your cardiologist on Monday to schedule a recheck and a change for your pacemaker battery. 2.  Return to the emergency department if you have any concerning symptoms.  Return for worsening or changing chest pain, feeling like he will pass out or other concerns.

## 2017-07-11 NOTE — ED Notes (Signed)
Bed: [facility] Expected date:  Expected time:  Means of arrival:  Comments: 

## 2017-07-11 NOTE — ED Notes (Signed)
After much back and forth with [facility], pt does in fact have a Research officer, political party pacemaker, their records however do not reflect this. Rep en route.

## 2017-07-11 NOTE — ED Notes (Signed)
Pt states that he has a Engineer, petroleum, however, interrogator in this ER would not correspond.

## 2017-07-11 NOTE — ED Notes (Signed)
St Jude pacemaker placed in 2006, Identity [address] [phone], serial # [id number].

## 2017-07-11 NOTE — ED Notes (Addendum)
St Jude rep at bedside Dionne Milo)

## 2017-07-21 ENCOUNTER — Encounter: Payer: Self-pay | Admitting: Internal Medicine

## 2017-08-03 ENCOUNTER — Ambulatory Visit (INDEPENDENT_AMBULATORY_CARE_PROVIDER_SITE_OTHER): Payer: Self-pay | Admitting: Internal Medicine

## 2017-08-03 ENCOUNTER — Encounter: Payer: Self-pay | Admitting: Internal Medicine

## 2017-08-03 DIAGNOSIS — I4581 Long QT syndrome: Secondary | ICD-10-CM

## 2017-08-03 DIAGNOSIS — F1121 Opioid dependence, in remission: Secondary | ICD-10-CM

## 2017-08-03 DIAGNOSIS — F4310 Post-traumatic stress disorder, unspecified: Secondary | ICD-10-CM

## 2017-08-03 MED ORDER — CLONAZEPAM 1 MG PO TABS: 1.0000 mg | ORAL_TABLET | Freq: Two times a day (BID) | ORAL | 2 refills | Status: DC

## 2017-08-03 NOTE — Assessment & Plan Note (Signed)
On Methadone 

## 2017-08-03 NOTE — Assessment & Plan Note (Addendum)
Appt w/Dr Caryl Comes is pending - today

## 2017-08-03 NOTE — Progress Notes (Signed)
Subjective:  Patient ID: Jeffrey [name], male    DOB: [birth year]  Age: 52 y.o. MRN: [id number]  CC: No chief complaint on file.   HPI Jeffrey [name] presents for anxiety, PTSD. F/u pacemaker issues...  Outpatient Medications Prior to Visit  Medication Sig Dispense Refill  . atenolol (TENORMIN) 50 MG tablet Take 50 mg by mouth daily.    . Cholecalciferol (VITAMIN D3) 2000 UNITS capsule Take 1 capsule (2,000 Units total) by mouth daily. 100 capsule 3  . citalopram (CELEXA) 40 MG tablet Take 1 tablet (40 mg total) by mouth daily. 30 tablet 5  . clonazePAM (KLONOPIN) 1 MG tablet Take 1 tablet (1 mg total) by mouth 2 (two) times daily. 60 tablet 2  . diphenhydrAMINE (BENADRYL) 25 MG tablet Take 25 mg by mouth every 6 (six) hours as needed for allergies. Reported on 06/11/2015    . doxycycline (VIBRA-TABS) 100 MG tablet Take 1 tablet (100 mg total) by mouth 2 (two) times daily. 14 tablet 0  . ibuprofen (ADVIL,MOTRIN) 200 MG tablet Take 400 mg by mouth every 6 (six) hours as needed for headache or moderate pain.    . methadone (DOLOPHINE) 10 MG/5ML solution Take 50 mLs (100 mg total) by mouth daily. Per Methadone clinic (Patient taking differently: Take 130 mg by mouth daily. Per Methadone clinic) 1 mL 0   No facility-administered medications prior to visit.     ROS Review of Systems  Constitutional: Negative for appetite change, fatigue and unexpected weight change.  HENT: Negative for congestion, nosebleeds, sneezing, sore throat and trouble swallowing.   Eyes: Negative for itching and visual disturbance.  Respiratory: Negative for cough.   Cardiovascular: Negative for chest pain, palpitations and leg swelling.  Gastrointestinal: Negative for abdominal distention, blood in stool, diarrhea and nausea.  Genitourinary: Negative for frequency and hematuria.  Musculoskeletal: Negative for back pain, gait problem, joint swelling and neck pain.  Skin: Negative for rash.  Neurological:  Negative for dizziness, tremors, speech difficulty and weakness.  Psychiatric/Behavioral: Positive for decreased concentration and dysphoric mood. Negative for agitation and sleep disturbance. The patient is nervous/anxious.     Objective:  BP 124/78 (BP Location: Left Arm, Patient Position: Sitting, Cuff Size: Large)   Pulse 65   Temp 98.4 F (36.9 C) (Oral)   Ht 6\' 1"  (1.854 m)   Wt 245 lb (111.1 kg)   SpO2 98%   BMI 32.32 kg/m   BP Readings from Last 3 Encounters:  08/03/17 124/78  07/11/17 111/80  05/05/17 128/82    Wt Readings from Last 3 Encounters:  08/03/17 245 lb (111.1 kg)  07/11/17 250 lb (113.4 kg)  05/05/17 250 lb (113.4 kg)    Physical Exam  Constitutional: He is oriented to person, place, and time. He appears well-developed. No distress.  NAD  HENT:  Mouth/Throat: Oropharynx is clear and moist.  Eyes: Pupils are equal, round, and reactive to light. Conjunctivae are normal.  Neck: Normal range of motion. No JVD present. No thyromegaly present.  Cardiovascular: Normal rate, regular rhythm, normal heart sounds and intact distal pulses. Exam reveals no gallop and no friction rub.  No murmur heard. Pulmonary/Chest: Effort normal and breath sounds normal. No respiratory distress. He has no wheezes. He has no rales. He exhibits no tenderness.  Abdominal: Soft. Bowel sounds are normal. He exhibits no distension and no mass. There is no tenderness. There is no rebound and no guarding.  Musculoskeletal: Normal range of motion. He exhibits no edema or  tenderness.  Lymphadenopathy:    He has no cervical adenopathy.  Neurological: He is alert and oriented to person, place, and time. He has normal reflexes. No cranial nerve deficit. He exhibits normal muscle tone. He displays a negative Romberg sign. Coordination and gait normal.  Skin: Skin is warm and dry. No rash noted.  Psychiatric: He has a normal mood and affect. His behavior is normal. Judgment and thought content  normal.    Lab Results  Component Value Date   WBC 8.3 07/11/2017   HGB 13.9 07/11/2017   HCT 40.7 07/11/2017   PLT 217 07/11/2017   GLUCOSE 105 (H) 07/11/2017   CHOL 168 09/28/2009   TRIG 218 (H) 09/28/2009   HDL 32 (L) 09/28/2009   LDLCALC 92 09/28/2009   ALT 24 09/28/2009   AST 27 09/28/2009   NA 138 07/11/2017   K 3.8 07/11/2017   CL 103 07/11/2017   CREATININE 1.16 07/11/2017   BUN 11 07/11/2017   CO2 25 07/11/2017   TSH 2.65 07/17/2016   PSA 0.19 06/16/2008   INR 1.0 12/13/2008   HGBA1C  12/13/2008    5.8 (NOTE) The ADA recommends the following therapeutic goal for glycemic control related to Hgb A1c measurement: Goal of therapy: <6.5 Hgb A1c  Reference: American Diabetes Association: Clinical Practice Recommendations 2010, Diabetes Care, 2010, 33: (Suppl  1).   MICROALBUR 1.03 06/06/2009    Dg Chest 2 View  Result Date: 07/11/2017 CLINICAL DATA:  Chest pain and tingling. Shortness of breath and tingling in the left arm. Symptoms for 2-3 days. EXAM: CHEST - 2 VIEW COMPARISON:  PA and lateral chest 02/21/2015. FINDINGS: Pacing device is unchanged. Lungs are clear. Heart size is normal. No pneumothorax or pleural effusion. No bony abnormality. IMPRESSION: No acute disease. Electronically Signed   By: Inge Rise M.D.   On: 07/11/2017 08:33    Assessment & Plan:   There are no diagnoses linked to this encounter. I am having [name] "Gaspar Bidding" maintain his methadone, Vitamin D3, ibuprofen, diphenhydrAMINE, atenolol, doxycycline, clonazePAM, and citalopram.  No orders of the defined types were placed in this encounter.    Follow-up: No follow-ups on file.  Walker Kehr, MD

## 2017-08-03 NOTE — Assessment & Plan Note (Signed)
On Klonopin, Celexa  Potential benefits of a long term benzodiazepines  use as well as potential risks  and complications were explained to the patient and were aknowledged. 

## 2017-08-05 ENCOUNTER — Encounter

## 2017-08-05 ENCOUNTER — Ambulatory Visit (INDEPENDENT_AMBULATORY_CARE_PROVIDER_SITE_OTHER): Payer: Self-pay | Admitting: Internal Medicine

## 2017-08-05 ENCOUNTER — Encounter: Payer: Self-pay | Admitting: Internal Medicine

## 2017-08-05 VITALS — BP 142/76 | HR 68 | Ht 73.0 in | Wt 248.0 lb

## 2017-08-05 DIAGNOSIS — Z01812 Encounter for preprocedural laboratory examination: Secondary | ICD-10-CM

## 2017-08-05 DIAGNOSIS — I4581 Long QT syndrome: Secondary | ICD-10-CM

## 2017-08-05 NOTE — Patient Instructions (Signed)
Medication Instructions: Your physician recommends that you continue on your current medications as directed. Please refer to the Current Medication list given to you today.  Labwork: Your physician has recommended that you have lab work today: BMET, CBC, PT/INR  Procedures/Testing: Your physician has recommended that you have a Pacemaker Generator Change. A pacemaker is a small device that is placed under the skin of your chest or abdomen to help control abnormal heart rhythms. This device uses electrical pulses to prompt the heart to beat at a normal rate. Pacemakers are used to treat heart rhythms that are too slow. Wire (leads) are attached to the pacemaker that goes into the chambers of you heart. This is done in the hospital and usually requires and overnight stay. Please see the instruction sheet given to you today for more information.  Follow-Up: Your physician recommends that you schedule a follow-up appointment in 10-14 days from 08/14/17 in the [facility] for a wound check  Your physician recommends that you schedule a follow-up appointment in 3 months from 08/14/17 with Dr. Caryl Comes.

## 2017-08-05 NOTE — Progress Notes (Signed)
ELECTROPHYSIOLOGY OFFICE NOTE  Patient ID: Jeffrey [name], MRN: [id number], DOB/AGE: Mar 23, 1966 52 y.o. Admit date: (Not on file) Date of Consult: 08/05/2017  Primary Physician: Cassandria Anger, MD Primary Cardiologist: new     SHAKEEL DISNEY is a 52 y.o. male who is being seen today for the evaluation of Pacemaker.    HPI Jeffrey [name] is a 52 y.o. male his device is reached ERI   Pacemaker implanted at [facility] following syncope sinus node dysfunction.  He was also diagnosed with long QT syndrome and has been on atenolol ever since.  There is no family history of long QT syndrome and no family history of syncope  At [facility] with generator replacement [employer] Jude  2006.  He is a longtime recovered opiate addict.  He has been on methadone for more than 2 decades  He has PTSD and anxiety.  He is taking care of his parents.  He has moderate exercise intolerance without edema and without chest pain  Past Medical History:  Diagnosis Date  . Acute sinusitis 10/29/2016   8/18  . Anxiety   . Cerumen impaction 10/29/2016   2018  . Long Q-T syndrome 12/19/2014   Pacemaker [place] low dose - no issues w/QT   . 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 Opioid dependence ([place]) 12/19/2014   x20 years On Methadone since 1993 - Methadone clinic   . Pacemaker    2006 St. Jude, Identity XL Dr [phone], serial # [id number]. St Jude rep must physically interrogate pacemaker.   Marland Kitchen PTSD (post-traumatic stress disorder) 12/19/2014   On Klonopin, Celexa  Potential benefits of a long term benzodiazepines  use as well as potential risks  and complications were explained to the patient and were aknowledged.   . Skin abscess 03/09/2015   11/16 - small   . Substance abuse ([place])    opioids  . Tremor 07/17/2016   ?etiology       Surgical History:  Past Surgical History:  Procedure Laterality Date  . HERNIA REPAIR Right   . LIVER SURGERY       Home  Meds: Prior to Admission medications   Medication Sig Start Date End Date Taking? Authorizing Provider  atenolol (TENORMIN) 50 MG tablet Take 50 mg by mouth daily.    [provider]  Cholecalciferol (VITAMIN D3) 2000 UNITS capsule Take 1 capsule (2,000 Units total) by mouth daily. 12/19/14   Plotnikov, Evie Lacks, MD  citalopram (CELEXA) 40 MG tablet Take 1 tablet (40 mg total) by mouth daily. 05/05/17   Plotnikov, Evie Lacks, MD  clonazePAM (KLONOPIN) 1 MG tablet Take 1 tablet (1 mg total) by mouth 2 (two) times daily. 08/03/17   Plotnikov, Evie Lacks, MD  diphenhydrAMINE (BENADRYL) 25 MG tablet Take 25 mg by mouth every 6 (six) hours as needed for allergies. Reported on 06/11/2015    [provider]  ibuprofen (ADVIL,MOTRIN) 200 MG tablet Take 400 mg by mouth every 6 (six) hours as needed for headache or moderate pain.    [provider]  methadone (DOLOPHINE) 10 MG/5ML solution Take 50 mLs (100 mg total) by mouth daily. Per Methadone clinic Patient taking differently: Take 130 mg by mouth daily. Per Methadone clinic 12/19/14   Plotnikov, Evie Lacks, MD    Allergies:  Allergies  Allergen Reactions  . Codeine Itching  . Penicillins Hives    Has patient had a PCN reaction causing  immediate rash, facial/tongue/throat swelling, SOB or lightheadedness with hypotension: unknown Has patient had a PCN reaction causing severe rash involving mucus membranes or skin necrosis: unknown Has patient had a PCN reaction that required hospitalization unknown Has patient had a PCN reaction occurring within the last 10 years: No If all of the above answers are "NO", then may proceed with Cephalosporin use.     Social History   Socioeconomic History  . Marital status: Divorced    Spouse name: Not on file  . Number of children: Not on file  . Years of education: Not on file  . Highest education level: Not on file  Occupational History  . Not on file  Social Needs  . Financial  resource strain: Not on file  . Food insecurity:    Worry: Not on file    Inability: Not on file  . Transportation needs:    Medical: Not on file    Non-medical: Not on file  Tobacco Use  . Smoking status: Never Smoker  . Smokeless tobacco: Current User    Types: Chew  Substance and Sexual Activity  . Alcohol use: No    Alcohol/week: 0.0 oz  . Drug use: No    Comment: methadone clinic  . Sexual activity: Not Currently  Lifestyle  . Physical activity:    Days per week: Not on file    Minutes per session: Not on file  . Stress: Not on file  Relationships  . Social connections:    Talks on phone: Not on file    Gets together: Not on file    Attends religious service: Not on file    Active member of club or organization: Not on file    Attends meetings of clubs or organizations: Not on file    Relationship status: Not on file  . Intimate partner violence:    Fear of current or ex partner: Not on file    Emotionally abused: Not on file    Physically abused: Not on file    Forced sexual activity: Not on file  Other Topics Concern  . Not on file  Social History Narrative  . Not on file     Family History  Problem Relation Age of Onset  . Depression Mother   . Diabetes Mother   . Arthritis Mother   . Arthritis Father      ROS:  Please see the history of present illness.    All other systems reviewed and negative.    Physical Exam: Blood pressure (!) 142/76, pulse 68, height 6\' 1"  (1.854 m), weight 248 lb (112.5 kg), SpO2 97 %. General: Well developed, well nourished male in no acute distress. Head: Normocephalic, atraumatic, sclera non-icteric, no xanthomas, nares are without discharge. EENT: normal  Lymph Nodes:  none Neck: Negative for carotid bruits. JVD not elevated. Back:without scoliosis kyphosis Lungs: Clear bilaterally to auscultation without wheezes, rales, or rhonchi. Breathing is unlabored.Device pocket well healed; without hematoma or erythema.  There  is no tethering  Heart: RRR with S1 S2. No murmur . No rubs, or gallops appreciated. Abdomen: Soft, non-tender, non-distended with normoactive bowel sounds. No hepatomegaly. No rebound/guarding. No obvious abdominal masses. Msk:  Strength and tone appear normal for age. Extremities: No clubbing or cyanosis. No edema.  Distal pedal pulses are 2+ and equal bilaterally. Skin: Warm and Dry Neuro: Alert and oriented X 3. CN III-XII intact Grossly normal sensory and motor function .  He has a tremor involving his lips Psych:  Responds to questions appropriately with a normal affect.      Labs: Cardiac Enzymes No results for input(s): CKTOTAL, CKMB, TROPONINI in the last 72 hours. CBC Lab Results  Component Value Date   WBC 8.3 07/11/2017   HGB 13.9 07/11/2017   HCT 40.7 07/11/2017   MCV 85.7 07/11/2017   PLT 217 07/11/2017   PROTIME: No results for input(s): LABPROT, INR in the last 72 hours. Chemistry No results for input(s): NA, K, CL, CO2, BUN, CREATININE, CALCIUM, PROT, BILITOT, ALKPHOS, ALT, AST, GLUCOSE in the last 168 hours.  Invalid input(s): LABALBU Lipids Lab Results  Component Value Date   CHOL 168 09/28/2009   HDL 32 (L) 09/28/2009   LDLCALC 92 09/28/2009   TRIG 218 (H) 09/28/2009   BNP Pro B Natriuretic peptide (BNP)  Date/Time Value Ref Range Status  12/13/2008 09:38 AM 39.0 0.0 - 100.0 pg/mL Final  06/16/2008 09:19 PM 12.5 0.0 - 100.0 pg/mL Final   Thyroid Function Tests: No results for input(s): TSH, T4TOTAL, T3FREE, THYROIDAB in the last 72 hours.  Invalid input(s): FREET3 Miscellaneous Lab Results  Component Value Date   DDIMER  12/13/2008    <0.22        AT THE INHOUSE ESTABLISHED CUTOFF VALUE OF 0.48 ug/mL FEU, THIS ASSAY HAS BEEN DOCUMENTED IN THE LITERATURE TO HAVE A SENSITIVITY AND NEGATIVE PREDICTIVE VALUE OF AT LEAST 98 TO 99%.  THE TEST RESULT SHOULD BE CORRELATED WITH AN ASSESSMENT OF THE CLINICAL PROBABILITY OF DVT / VTE.     Radiology/Studies:  Dg Chest 2 View  Result Date: 07/11/2017 CLINICAL DATA:  Chest pain and tingling. Shortness of breath and tingling in the left arm. Symptoms for 2-3 days. EXAM: CHEST - 2 VIEW COMPARISON:  PA and lateral chest 02/21/2015. FINDINGS: Pacing device is unchanged. Lungs are clear. Heart size is normal. No pneumothorax or pleural effusion. No bony abnormality. IMPRESSION: No acute disease. Electronically Signed   By: Inge Rise M.D.   On: 07/11/2017 08:33    EKG: Sinus at 70 Intervals 15/10/43 with a QTC of 464  Chest Xray personally reviewed dual-chamber pacemaker in place Assessment and Plan:  Pacemaker-[employer] at [facility]  Sinus node dysfunction  Long QT syndrome diagnosed in [place] without accompanying family history  Methadone long-term for opiate addiction  PTSD/anxiety  Tremor  Patient has reached ERI.  His ventricular lead has a high threshold but he uses it almost never.  The plan would be to replace his device without revising his lead.  We have reviewed the benefits and risks of generator replacement.  These include but are not limited to lead fracture and infection.  The patient understands, agrees and is willing to proceed.    He will be driving himself.  We will use local anesthetic.  [name]

## 2017-08-05 NOTE — Addendum Note (Signed)
Addended by: Campbell Riches on: 08/05/2017 02:31 PM   Modules accepted: Orders

## 2017-08-05 NOTE — H&P (View-Only) (Signed)
ELECTROPHYSIOLOGY OFFICE NOTE  Patient ID: Jeffrey [name], MRN: [id number], DOB/AGE: [birth year] 52 y.o. Admit date: (Not on file) Date of Consult: 08/05/2017  Primary Physician: Cassandria Anger, MD Primary Cardiologist: new     Jeffrey [name] is a 52 y.o. male who is being seen today for the evaluation of Pacemaker.    HPI Jeffrey [name] is a 52 y.o. male his device is reached ERI   Pacemaker implanted at [facility] following syncope sinus node dysfunction.  He was also diagnosed with long QT syndrome and has been on atenolol ever since.  There is no family history of long QT syndrome and no family history of syncope  At [facility] with generator replacement St Jude  2006.  He is a longtime recovered opiate addict.  He has been on methadone for more than 2 decades  He has PTSD and anxiety.  He is taking care of his parents.  He has moderate exercise intolerance without edema and without chest pain  Past Medical History:  Diagnosis Date  . Acute sinusitis 10/29/2016   8/18  . Anxiety   . Cerumen impaction 10/29/2016   2018  . Long Q-T syndrome 12/19/2014   Pacemaker Jeffrey [name] low dose - no issues w/QT   . 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 Opioid dependence ([place]) 12/19/2014   x20 years On Methadone since 1993 - Methadone clinic   . Pacemaker    2006 St. Jude, Identity XL Dr [id number], serial # [id number]. St Jude rep must physically interrogate pacemaker.   Marland Kitchen PTSD (post-traumatic stress disorder) 12/19/2014   On Klonopin, Celexa  Potential benefits of a long term benzodiazepines  use as well as potential risks  and complications were explained to the patient and were aknowledged.   . Skin abscess 03/09/2015   11/16 - small   . Substance abuse ([place])    opioids  . Tremor 07/17/2016   ?etiology       Surgical History:  Past Surgical History:  Procedure Laterality Date  . HERNIA REPAIR Right   . LIVER SURGERY       Home  Meds: Prior to Admission medications   Medication Sig Start Date End Date Taking? Authorizing Provider  atenolol (TENORMIN) 50 MG tablet Take 50 mg by mouth daily.    [provider]  Cholecalciferol (VITAMIN D3) 2000 UNITS capsule Take 1 capsule (2,000 Units total) by mouth daily. 12/19/14   Plotnikov, Evie Lacks, MD  citalopram (CELEXA) 40 MG tablet Take 1 tablet (40 mg total) by mouth daily. 05/05/17   Plotnikov, Evie Lacks, MD  clonazePAM (KLONOPIN) 1 MG tablet Take 1 tablet (1 mg total) by mouth 2 (two) times daily. 08/03/17   Plotnikov, Evie Lacks, MD  diphenhydrAMINE (BENADRYL) 25 MG tablet Take 25 mg by mouth every 6 (six) hours as needed for allergies. Reported on 06/11/2015    [provider]  ibuprofen (ADVIL,MOTRIN) 200 MG tablet Take 400 mg by mouth every 6 (six) hours as needed for headache or moderate pain.    [provider]  methadone (DOLOPHINE) 10 MG/5ML solution Take 50 mLs (100 mg total) by mouth daily. Per Methadone clinic Patient taking differently: Take 130 mg by mouth daily. Per Methadone clinic 12/19/14   Plotnikov, Evie Lacks, MD    Allergies:  Allergies  Allergen Reactions  . Codeine Itching  . Penicillins Hives    Has patient had a PCN reaction causing  immediate rash, facial/tongue/throat swelling, SOB or lightheadedness with hypotension: unknown Has patient had a PCN reaction causing severe rash involving mucus membranes or skin necrosis: unknown Has patient had a PCN reaction that required hospitalization unknown Has patient had a PCN reaction occurring within the last 10 years: No If all of the above answers are "NO", then may proceed with Cephalosporin use.     Social History   Socioeconomic History  . Marital status: Divorced    Spouse name: Not on file  . Number of children: Not on file  . Years of education: Not on file  . Highest education level: Not on file  Occupational History  . Not on file  Social Needs  . Financial  resource strain: Not on file  . Food insecurity:    Worry: Not on file    Inability: Not on file  . Transportation needs:    Medical: Not on file    Non-medical: Not on file  Tobacco Use  . Smoking status: Never Smoker  . Smokeless tobacco: Current User    Types: Chew  Substance and Sexual Activity  . Alcohol use: No    Alcohol/week: 0.0 oz  . Drug use: No    Comment: methadone clinic  . Sexual activity: Not Currently  Lifestyle  . Physical activity:    Days per week: Not on file    Minutes per session: Not on file  . Stress: Not on file  Relationships  . Social connections:    Talks on phone: Not on file    Gets together: Not on file    Attends religious service: Not on file    Active member of club or organization: Not on file    Attends meetings of clubs or organizations: Not on file    Relationship status: Not on file  . Intimate partner violence:    Fear of current or ex partner: Not on file    Emotionally abused: Not on file    Physically abused: Not on file    Forced sexual activity: Not on file  Other Topics Concern  . Not on file  Social History Narrative  . Not on file     Family History  Problem Relation Age of Onset  . Depression Mother   . Diabetes Mother   . Arthritis Mother   . Arthritis Father      ROS:  Please see the history of present illness.    All other systems reviewed and negative.    Physical Exam: Blood pressure (!) 142/76, pulse 68, height 6\' 1"  (1.854 m), weight 248 lb (112.5 kg), SpO2 97 %. General: Well developed, well nourished male in no acute distress. Head: Normocephalic, atraumatic, sclera non-icteric, no xanthomas, nares are without discharge. EENT: normal  Lymph Nodes:  none Neck: Negative for carotid bruits. JVD not elevated. Back:without scoliosis kyphosis Lungs: Clear bilaterally to auscultation without wheezes, rales, or rhonchi. Breathing is unlabored.Device pocket well healed; without hematoma or erythema.  There  is no tethering  Heart: RRR with S1 S2. No murmur . No rubs, or gallops appreciated. Abdomen: Soft, non-tender, non-distended with normoactive bowel sounds. No hepatomegaly. No rebound/guarding. No obvious abdominal masses. Msk:  Strength and tone appear normal for age. Extremities: No clubbing or cyanosis. No edema.  Distal pedal pulses are 2+ and equal bilaterally. Skin: Warm and Dry Neuro: Alert and oriented X 3. CN III-XII intact Grossly normal sensory and motor function .  He has a tremor involving his lips Psych:  Responds to questions appropriately with a normal affect.      Labs: Cardiac Enzymes No results for input(s): CKTOTAL, CKMB, TROPONINI in the last 72 hours. CBC Lab Results  Component Value Date   WBC 8.3 07/11/2017   HGB 13.9 07/11/2017   HCT 40.7 07/11/2017   MCV 85.7 07/11/2017   PLT 217 07/11/2017   PROTIME: No results for input(s): LABPROT, INR in the last 72 hours. Chemistry No results for input(s): NA, K, CL, CO2, BUN, CREATININE, CALCIUM, PROT, BILITOT, ALKPHOS, ALT, AST, GLUCOSE in the last 168 hours.  Invalid input(s): LABALBU Lipids Lab Results  Component Value Date   CHOL 168 09/28/2009   HDL 32 (L) 09/28/2009   LDLCALC 92 09/28/2009   TRIG 218 (H) 09/28/2009   BNP Pro B Natriuretic peptide (BNP)  Date/Time Value Ref Range Status  12/13/2008 09:38 AM 39.0 0.0 - 100.0 pg/mL Final  06/16/2008 09:19 PM 12.5 0.0 - 100.0 pg/mL Final   Thyroid Function Tests: No results for input(s): TSH, T4TOTAL, T3FREE, THYROIDAB in the last 72 hours.  Invalid input(s): FREET3 Miscellaneous Lab Results  Component Value Date   DDIMER  12/13/2008    <0.22        AT THE INHOUSE ESTABLISHED CUTOFF VALUE OF 0.48 ug/mL FEU, THIS ASSAY HAS BEEN DOCUMENTED IN THE LITERATURE TO HAVE A SENSITIVITY AND NEGATIVE PREDICTIVE VALUE OF AT LEAST 98 TO 99%.  THE TEST RESULT SHOULD BE CORRELATED WITH AN ASSESSMENT OF THE CLINICAL PROBABILITY OF DVT / VTE.     Radiology/Studies:  Dg Chest 2 View  Result Date: 07/11/2017 CLINICAL DATA:  Chest pain and tingling. Shortness of breath and tingling in the left arm. Symptoms for 2-3 days. EXAM: CHEST - 2 VIEW COMPARISON:  PA and lateral chest 02/21/2015. FINDINGS: Pacing device is unchanged. Lungs are clear. Heart size is normal. No pneumothorax or pleural effusion. No bony abnormality. IMPRESSION: No acute disease. Electronically Signed   By: Inge Rise M.D.   On: 07/11/2017 08:33    EKG: Sinus at 70 Intervals 15/10/43 with a QTC of 464  Chest Xray personally reviewed dual-chamber pacemaker in place Assessment and Plan:  Pacemaker-Saint Jude at [facility]  Sinus node dysfunction  Long QT syndrome diagnosed in [place] without accompanying family history  Methadone long-term for opiate addiction  PTSD/anxiety  Tremor  Patient has reached ERI.  His ventricular lead has a high threshold but he uses it almost never.  The plan would be to replace his device without revising his lead.  We have reviewed the benefits and risks of generator replacement.  These include but are not limited to lead fracture and infection.  The patient understands, agrees and is willing to proceed.    He will be driving himself.  We will use local anesthetic.  [name]

## 2017-08-06 LAB — BASIC METABOLIC PANEL
BUN / CREAT RATIO: 9 (ref 9–20)
BUN: 12 mg/dL (ref 6–24)
CO2: 26 mmol/L (ref 20–29)
CREATININE: 1.37 mg/dL — AB (ref 0.76–1.27)
Calcium: 9.2 mg/dL (ref 8.7–10.2)
Chloride: 101 mmol/L (ref 96–106)
GFR, EST AFRICAN AMERICAN: 67 mL/min/{1.73_m2} (ref >59)
GFR, EST NON AFRICAN AMERICAN: 58 mL/min/{1.73_m2} — AB (ref >59)
Glucose: 94 mg/dL (ref 65–99)
Potassium: 4.5 mmol/L (ref 3.5–5.2)
SODIUM: 141 mmol/L (ref 134–144)

## 2017-08-06 LAB — CBC WITH DIFFERENTIAL/PLATELET
BASOS ABS: 0 10*3/uL (ref 0.0–0.2)
Basos: 0 %
EOS (ABSOLUTE): 0.1 10*3/uL (ref 0.0–0.4)
Eos: 2 %
Hematocrit: 41.3 % (ref 37.5–51.0)
Hemoglobin: 14 g/dL (ref 13.0–17.7)
IMMATURE GRANS (ABS): 0 10*3/uL (ref 0.0–0.1)
IMMATURE GRANULOCYTES: 0 %
LYMPHS: 33 %
Lymphocytes Absolute: 2.7 10*3/uL (ref 0.7–3.1)
MCH: 28.7 pg (ref 26.6–33.0)
MCHC: 33.9 g/dL (ref 31.5–35.7)
MCV: 85 fL (ref 79–97)
Monocytes Absolute: 0.5 10*3/uL (ref 0.1–0.9)
Monocytes: 7 %
NEUTROS PCT: 58 %
Neutrophils Absolute: 4.7 10*3/uL (ref 1.4–7.0)
PLATELETS: 218 10*3/uL (ref 150–379)
RBC: 4.87 x10E6/uL (ref 4.14–5.80)
RDW: 13.1 % (ref 12.3–15.4)
WBC: 8.1 10*3/uL (ref 3.4–10.8)

## 2017-08-06 LAB — PROTIME-INR
INR: 1 (ref 0.8–1.2)
Prothrombin Time: 10.2 s (ref 9.1–12.0)

## 2017-08-11 ENCOUNTER — Telehealth: Payer: Self-pay

## 2017-08-11 NOTE — Telephone Encounter (Signed)
Pt is aware and agreeable to lab results with mild change to renal function. Pt is agreeable to following up with PCP about renal function. He is aware that a copy of labs were sent to PCP.

## 2017-08-11 NOTE — Telephone Encounter (Signed)
-----   Message from Deboraha Sprang, MD sent at 08/10/2017  4:39 PM EDT ----- Please Inform Patient that labs are normal x MILD CHANGE in renal functioon   Should follow up with his PCP about this Thanks

## 2017-08-12 ENCOUNTER — Telehealth: Payer: Self-pay | Admitting: Internal Medicine

## 2017-08-12 NOTE — Telephone Encounter (Signed)
New Message:       Pt is calling with questions about his lab results in reference to his kidneys. Pt states he will try to look at the my chart to see if it detailed enough.

## 2017-08-12 NOTE — Telephone Encounter (Signed)
Spoke with pt to clarify lab results. He states he will follow up with his PCP regarding kidney function. He verified his procedure date (5/24) as well.

## 2017-08-14 ENCOUNTER — Encounter (HOSPITAL_COMMUNITY): Payer: Self-pay | Admitting: Internal Medicine

## 2017-08-14 ENCOUNTER — Ambulatory Visit (HOSPITAL_COMMUNITY)
Admission: RE | Admit: 2017-08-14 | Discharge: 2017-08-14 | Disposition: A | Discharge status: Home / Self Care | Payer: Self-pay | Source: Ambulatory Visit | Attending: Internal Medicine | Admitting: Internal Medicine

## 2017-08-14 ENCOUNTER — Ambulatory Visit (HOSPITAL_COMMUNITY)
Admission: RE | Disposition: A | Discharge status: Home / Self Care | Payer: Self-pay | Source: Ambulatory Visit | Attending: Internal Medicine

## 2017-08-14 DIAGNOSIS — Z833 Family history of diabetes mellitus: Secondary | ICD-10-CM | POA: Insufficient documentation

## 2017-08-14 DIAGNOSIS — Z45010 Encounter for checking and testing of cardiac pacemaker pulse generator [battery]: Secondary | ICD-10-CM

## 2017-08-14 DIAGNOSIS — R55 Syncope and collapse: Secondary | ICD-10-CM | POA: Insufficient documentation

## 2017-08-14 DIAGNOSIS — F4310 Post-traumatic stress disorder, unspecified: Secondary | ICD-10-CM | POA: Insufficient documentation

## 2017-08-14 DIAGNOSIS — Z8261 Family history of arthritis: Secondary | ICD-10-CM | POA: Insufficient documentation

## 2017-08-14 DIAGNOSIS — Z79899 Other long term (current) drug therapy: Secondary | ICD-10-CM | POA: Insufficient documentation

## 2017-08-14 DIAGNOSIS — Z885 Allergy status to narcotic agent status: Secondary | ICD-10-CM | POA: Insufficient documentation

## 2017-08-14 DIAGNOSIS — Z45018 Encounter for adjustment and management of other part of cardiac pacemaker: Secondary | ICD-10-CM | POA: Insufficient documentation

## 2017-08-14 DIAGNOSIS — I4581 Long QT syndrome: Secondary | ICD-10-CM

## 2017-08-14 DIAGNOSIS — F419 Anxiety disorder, unspecified: Secondary | ICD-10-CM | POA: Insufficient documentation

## 2017-08-14 DIAGNOSIS — Z9889 Other specified postprocedural states: Secondary | ICD-10-CM | POA: Insufficient documentation

## 2017-08-14 DIAGNOSIS — Z79891 Long term (current) use of opiate analgesic: Secondary | ICD-10-CM | POA: Insufficient documentation

## 2017-08-14 DIAGNOSIS — I495 Sick sinus syndrome: Secondary | ICD-10-CM | POA: Insufficient documentation

## 2017-08-14 DIAGNOSIS — Z818 Family history of other mental and behavioral disorders: Secondary | ICD-10-CM | POA: Insufficient documentation

## 2017-08-14 DIAGNOSIS — Z950 Presence of cardiac pacemaker: Secondary | ICD-10-CM

## 2017-08-14 DIAGNOSIS — F17220 Nicotine dependence, chewing tobacco, uncomplicated: Secondary | ICD-10-CM | POA: Insufficient documentation

## 2017-08-14 DIAGNOSIS — R251 Tremor, unspecified: Secondary | ICD-10-CM | POA: Insufficient documentation

## 2017-08-14 DIAGNOSIS — F1120 Opioid dependence, uncomplicated: Secondary | ICD-10-CM | POA: Insufficient documentation

## 2017-08-14 DIAGNOSIS — Z880 Allergy status to penicillin: Secondary | ICD-10-CM | POA: Insufficient documentation

## 2017-08-14 HISTORY — PX: PPM GENERATOR CHANGEOUT: EP1233

## 2017-08-14 LAB — SURGICAL PCR SCREEN
MRSA, PCR: NEGATIVE
Staphylococcus aureus: NEGATIVE

## 2017-08-14 SURGERY — PPM GENERATOR CHANGEOUT

## 2017-08-14 MED ORDER — SODIUM CHLORIDE 0.9 % IV SOLN
80.0000 mg | INTRAVENOUS | Status: AC
  Administered 2017-08-14: 80 mg

## 2017-08-14 MED ORDER — SODIUM CHLORIDE 0.9 % IV SOLN: INTRAVENOUS | Status: AC

## 2017-08-14 MED ORDER — FENTANYL CITRATE (PF) 100 MCG/2ML IJ SOLN
INTRAMUSCULAR | Status: AC
  Filled 2017-08-14: qty 2

## 2017-08-14 MED ORDER — MIDAZOLAM HCL 5 MG/5ML IJ SOLN
INTRAMUSCULAR | Status: DC | PRN
  Administered 2017-08-14: 2 mg via INTRAVENOUS

## 2017-08-14 MED ORDER — LIDOCAINE HCL (PF) 1 % IJ SOLN
INTRAMUSCULAR | Status: DC | PRN
  Administered 2017-08-14: 45 mL

## 2017-08-14 MED ORDER — ACETAMINOPHEN 325 MG PO TABS
325.0000 mg | ORAL_TABLET | ORAL | Status: DC | PRN
  Filled 2017-08-14: qty 2

## 2017-08-14 MED ORDER — CHLORHEXIDINE GLUCONATE 4 % EX LIQD
60.0000 mL | Freq: Once | CUTANEOUS | Status: DC
  Filled 2017-08-14: qty 60

## 2017-08-14 MED ORDER — MUPIROCIN 2 % EX OINT
TOPICAL_OINTMENT | CUTANEOUS | Status: AC
  Administered 2017-08-14: 1 via TOPICAL
  Filled 2017-08-14: qty 22

## 2017-08-14 MED ORDER — LIDOCAINE HCL (PF) 1 % IJ SOLN
INTRAMUSCULAR | Status: AC
  Filled 2017-08-14: qty 60

## 2017-08-14 MED ORDER — SODIUM CHLORIDE 0.9 % IV SOLN
1500.0000 mg | INTRAVENOUS | Status: AC
  Administered 2017-08-14: 1500 mg via INTRAVENOUS
  Filled 2017-08-14: qty 1500

## 2017-08-14 MED ORDER — SODIUM CHLORIDE 0.9 % IV SOLN
INTRAVENOUS | Status: DC
  Administered 2017-08-14: 08:00:00 via INTRAVENOUS

## 2017-08-14 MED ORDER — SODIUM CHLORIDE 0.9 % IV SOLN
INTRAVENOUS | Status: AC
  Filled 2017-08-14: qty 2

## 2017-08-14 MED ORDER — MIDAZOLAM HCL 5 MG/5ML IJ SOLN
INTRAMUSCULAR | Status: AC
  Filled 2017-08-14: qty 5

## 2017-08-14 MED ORDER — MUPIROCIN 2 % EX OINT
1.0000 "application " | TOPICAL_OINTMENT | Freq: Once | CUTANEOUS | Status: AC
  Administered 2017-08-14: 1 via TOPICAL
  Filled 2017-08-14: qty 22

## 2017-08-14 SURGICAL SUPPLY — 5 items
CABLE SURGICAL S-101-97-12 (CABLE) ×2 IMPLANT
HEMOSTAT SURGICEL 2X4 FIBR (HEMOSTASIS) ×2 IMPLANT
PACEMAKER ASSURITY DR-RF (Pacemaker) ×2 IMPLANT
PAD DEFIB LIFELINK (PAD) ×2 IMPLANT
TRAY PACEMAKER INSERTION (PACKS) ×2 IMPLANT

## 2017-08-14 NOTE — Discharge Instructions (Signed)
**Note -identified via Obfuscation** Pacemaker Battery Change, Care After °This sheet gives you information about how to care for yourself after your procedure. Your health care provider may also give you more specific instructions. If you have problems or questions, contact your health care provider. °What can I expect after the procedure? °After your procedure, it is common to have: °· Pain or soreness at the site where the pacemaker was inserted. °· Swelling at the site where the pacemaker was inserted. ° °Follow these instructions at home: °Incision care °· Keep the incision clean and dry. °? Do not take baths, swim, or use a hot tub until your health care provider approves. °? You may shower the day after your procedure, or as directed by your health care provider. °? Pat the area dry with a clean towel. Do not rub the area. This may cause bleeding. °· Follow instructions from your health care provider about how to take care of your incision. Make sure you: °? Wash your hands with soap and water before you change your bandage (dressing). If soap and water are not available, use hand sanitizer. °? Change your dressing as told by your health care provider. °? Leave stitches (sutures), skin glue, or adhesive strips in place. These skin closures may need to stay in place for 2 weeks or longer. If adhesive strip edges start to loosen and curl up, you may trim the loose edges. Do not remove adhesive strips completely unless your health care provider tells you to do that. °· Check your incision area every day for signs of infection. Check for: °? More redness, swelling, or pain. °? More fluid or blood. °? Warmth. °? Pus or a bad smell. °Activity °· Do not lift anything that is heavier than 10 lb (4.5 kg) until your health care provider says it is okay to do so. °· For the first 2 weeks, or as long as told by your health care provider: °? Avoid lifting your left arm higher than your shoulder. °? Be gentle when you move your arms over your head. It is okay  to raise your arm to comb your hair. °? Avoid strenuous exercise. °· Ask your health care provider when it is okay to: °? Resume your normal activities. °? Return to work or school. °? Resume sexual activity. °Eating and drinking °· Eat a heart-healthy diet. This should include plenty of fresh fruits and vegetables, whole grains, low-fat dairy products, and lean protein like chicken and fish. °· Limit alcohol intake to no more than 1 drink a day for non-pregnant women and 2 drinks a day for men. One drink equals 12 oz of beer, 5 oz of wine, or 1½ oz of hard liquor. °· Check ingredients and nutrition facts on packaged foods and beverages. Avoid the following types of food: °? Food that is high in salt (sodium). °? Food that is high in saturated fat, like full-fat dairy or red meat. °? Food that is high in trans fat, like fried food. °? Food and drinks that are high in sugar. °Lifestyle °· Do not use any products that contain nicotine or tobacco, such as cigarettes and e-cigarettes. If you need help quitting, ask your health care provider. °· Take steps to manage and control your weight. °· Get regular exercise. Aim for 150 minutes of moderate-intensity exercise (such as walking or yoga) or 75 minutes of vigorous exercise (such as running or swimming) each week. °· Manage other health problems, such as diabetes or high blood pressure. Ask your health  **Note -identified via Obfuscation** care provider how you can manage these conditions. °General instructions °· Do not drive for 24 hours after your procedure if you were given a medicine to help you relax (sedative). °· Take over-the-counter and prescription medicines only as told by your health care provider. °· Avoid putting pressure on the area where the pacemaker was placed. °· If you need an MRI after your pacemaker has been placed, be sure to tell the health care provider who orders the MRI that you have a pacemaker. °· Avoid close and prolonged exposure to electrical devices that have strong  magnetic fields. These include: °? Cell phones. Avoid keeping them in a pocket near the pacemaker, and try using the ear opposite the pacemaker. °? MP3 players. °? Household appliances, like microwaves. °? Metal detectors. °? Electric generators. °? High-tension wires. °· Keep all follow-up visits as directed by your health care provider. This is important. °Contact a health care provider if: °· You have pain at the incision site that is not relieved by over-the-counter or prescription medicines. °· You have any of these around your incision site or coming from it: °? More redness, swelling, or pain. °? Fluid or blood. °? Warmth to the touch. °? Pus or a bad smell. °· You have a fever. °· You feel brief, occasional palpitations, light-headedness, or any symptoms that you think might be related to your heart. °Get help right away if: °· You experience chest pain that is different from the pain at the pacemaker site. °· You develop a red streak that extends above or below the incision site. °· You experience shortness of breath. °· You have palpitations or an irregular heartbeat. °· You have light-headedness that does not go away quickly. °· You faint or have dizzy spells. °· Your pulse suddenly drops or increases rapidly and does not return to normal. °· You begin to gain weight and your legs and ankles swell. °Summary °· After your procedure, it is common to have pain, soreness, and some swelling where the pacemaker was inserted. °· Make sure to keep your incision clean and dry. Follow instructions from your health care provider about how to take care of your incision. °· Check your incision every day for signs of infection, such as more pain or swelling, pus or a bad smell, warmth, or leaking fluid and blood. °· Avoid strenuous exercise and lifting your left arm higher than your shoulder for 2 weeks, or as long as told by your health care provider. °This information is not intended to replace advice given to you by  your health care provider. Make sure you discuss any questions you have with your health care provider. °Document Released: 12/29/2012 Document Revised: 01/31/2016 Document Reviewed: 01/31/2016 °Elsevier Interactive Patient Education © 2017 Else[employer] Inc. ° °

## 2017-08-14 NOTE — Interval H&P Note (Signed)
History and Physical Interval Note:  08/14/2017 9:52 AM  Jeffrey [name]  has presented today for surgery, with the diagnosis of snd  The various methods of treatment have been discussed with the patient and family. After consideration of risks, benefits and other options for treatment, the patient has consented to  Procedure(s): PPM GENERATOR CHANGEOUT (N/A) as a surgical intervention .  The patient's history has been reviewed, patient examined, no change in status, stable for surgery.  I have reviewed the patient's chart and labs.  Questions were answered to the patient's satisfaction.   Anemia   [name]

## 2017-08-18 MED FILL — Gentamicin Sulfate Inj 40 MG/ML: INTRAMUSCULAR | Qty: 80 | Status: AC

## 2017-08-18 MED FILL — Fentanyl Citrate Preservative Free (PF) Inj 100 MCG/2ML: INTRAMUSCULAR | Qty: 2 | Status: AC

## 2017-08-26 ENCOUNTER — Ambulatory Visit: Payer: Self-pay

## 2017-08-28 ENCOUNTER — Encounter: Payer: Self-pay | Admitting: Internal Medicine

## 2017-09-02 ENCOUNTER — Ambulatory Visit: Payer: Self-pay

## 2017-09-16 ENCOUNTER — Ambulatory Visit: Payer: Self-pay

## 2017-11-02 ENCOUNTER — Ambulatory Visit: Payer: Self-pay | Admitting: Internal Medicine

## 2017-11-09 ENCOUNTER — Encounter: Payer: Self-pay | Admitting: Internal Medicine

## 2017-11-09 ENCOUNTER — Ambulatory Visit (INDEPENDENT_AMBULATORY_CARE_PROVIDER_SITE_OTHER): Payer: Self-pay | Admitting: Internal Medicine

## 2017-11-09 DIAGNOSIS — I4581 Long QT syndrome: Secondary | ICD-10-CM

## 2017-11-09 DIAGNOSIS — F1121 Opioid dependence, in remission: Secondary | ICD-10-CM

## 2017-11-09 DIAGNOSIS — F4310 Post-traumatic stress disorder, unspecified: Secondary | ICD-10-CM

## 2017-11-09 MED ORDER — CLONAZEPAM 1 MG PO TABS: 1.0000 mg | ORAL_TABLET | Freq: Two times a day (BID) | ORAL | 2 refills | Status: DC

## 2017-11-09 MED ORDER — CITALOPRAM HYDROBROMIDE 40 MG PO TABS: 40.0000 mg | ORAL_TABLET | Freq: Every day | ORAL | 5 refills | Status: DC

## 2017-11-09 NOTE — Assessment & Plan Note (Signed)
[facility]

## 2017-11-09 NOTE — Assessment & Plan Note (Signed)
On Klonopin, Celexa  Potential benefits of a long term benzodiazepines  use as well as potential risks  and complications were explained to the patient and were aknowledged. 

## 2017-11-09 NOTE — Assessment & Plan Note (Signed)
new 07/2017

## 2017-11-09 NOTE — Progress Notes (Signed)
Subjective:  Patient ID: Jeffrey [name], male    DOB: May 05, 1965  Age: 52 y.o. MRN: [id number]  CC: No chief complaint on file.   HPI Jeffrey [name] presents for long QT - s/p pacemaker re-implant 07/2017 F/u PTSD  Outpatient Medications Prior to Visit  Medication Sig Dispense Refill  . atenolol (TENORMIN) 50 MG tablet Take 50 mg by mouth daily.    . Cholecalciferol (VITAMIN D3) 2000 UNITS capsule Take 1 capsule (2,000 Units total) by mouth daily. 100 capsule 3  . citalopram (CELEXA) 40 MG tablet Take 1 tablet (40 mg total) by mouth daily. 30 tablet 5  . clonazePAM (KLONOPIN) 1 MG tablet Take 1 tablet (1 mg total) by mouth 2 (two) times daily. 60 tablet 2  . ibuprofen (ADVIL,MOTRIN) 200 MG tablet Take 200-400 mg by mouth every 6 (six) hours as needed for headache or moderate pain.     . Methadone HCl (DOLOPHINE PO) Take 130 mg by mouth daily.     No facility-administered medications prior to visit.     ROS: Review of Systems  Constitutional: Negative for appetite change, fatigue and unexpected weight change.  HENT: Negative for congestion, nosebleeds, sneezing, sore throat and trouble swallowing.   Eyes: Negative for itching and visual disturbance.  Respiratory: Negative for cough.   Cardiovascular: Negative for chest pain, palpitations and leg swelling.  Gastrointestinal: Negative for abdominal distention, blood in stool, diarrhea and nausea.  Genitourinary: Negative for frequency and hematuria.  Musculoskeletal: Negative for back pain, gait problem, joint swelling and neck pain.  Skin: Negative for rash.  Neurological: Negative for dizziness, tremors, speech difficulty and weakness.  Psychiatric/Behavioral: Positive for sleep disturbance. Negative for agitation and dysphoric mood. The patient is nervous/anxious.     Objective:  BP 134/84 (BP Location: Left Arm, Patient Position: Sitting, Cuff Size: Large)   Pulse 82   Temp 98.2 F (36.8 C) (Oral)   Ht 6\' 1"  (1.854  m)   Wt 248 lb (112.5 kg)   SpO2 98%   BMI 32.72 kg/m   BP Readings from Last 3 Encounters:  11/09/17 134/84  08/14/17 133/75  08/05/17 (!) 142/76    Wt Readings from Last 3 Encounters:  11/09/17 248 lb (112.5 kg)  08/14/17 250 lb (113.4 kg)  08/05/17 248 lb (112.5 kg)    Physical Exam  Constitutional: He is oriented to person, place, and time. He appears well-developed. No distress.  NAD  HENT:  Mouth/Throat: Oropharynx is clear and moist.  Eyes: Pupils are equal, round, and reactive to light. Conjunctivae are normal.  Neck: Normal range of motion. No JVD present. No thyromegaly present.  Cardiovascular: Normal rate, regular rhythm, normal heart sounds and intact distal pulses. Exam reveals no gallop and no friction rub.  No murmur heard. Pulmonary/Chest: Effort normal and breath sounds normal. No respiratory distress. He has no wheezes. He has no rales. He exhibits no tenderness.  Abdominal: Soft. Bowel sounds are normal. He exhibits no distension and no mass. There is no tenderness. There is no rebound and no guarding.  Musculoskeletal: Normal range of motion. He exhibits no edema or tenderness.  Lymphadenopathy:    He has no cervical adenopathy.  Neurological: He is alert and oriented to person, place, and time. He has normal reflexes. No cranial nerve deficit. He exhibits normal muscle tone. He displays a negative Romberg sign. Coordination and gait normal.  Skin: Skin is warm and dry. No rash noted.  Psychiatric: He has a normal mood and  affect. His behavior is normal. Judgment and thought content normal.  tremor  Lab Results  Component Value Date   WBC 8.1 08/05/2017   HGB 14.0 08/05/2017   HCT 41.3 08/05/2017   PLT 218 08/05/2017   GLUCOSE 94 08/05/2017   CHOL 168 09/28/2009   TRIG 218 (H) 09/28/2009   HDL 32 (L) 09/28/2009   LDLCALC 92 09/28/2009   ALT 24 09/28/2009   AST 27 09/28/2009   NA 141 08/05/2017   K 4.5 08/05/2017   CL 101 08/05/2017    CREATININE 1.37 (H) 08/05/2017   BUN 12 08/05/2017   CO2 26 08/05/2017   TSH 2.65 07/17/2016   PSA 0.19 06/16/2008   INR 1.0 08/05/2017   HGBA1C  12/13/2008    5.8 (NOTE) The ADA recommends the following therapeutic goal for glycemic control related to Hgb A1c measurement: Goal of therapy: <6.5 Hgb A1c  Reference: American Diabetes Association: Clinical Practice Recommendations 2010, Diabetes Care, 2010, 33: (Suppl  1).   MICROALBUR 1.03 06/06/2009    No results found.  Assessment & Plan:   There are no diagnoses linked to this encounter.   No orders of the defined types were placed in this encounter.    Follow-up: No follow-ups on file.  Walker Kehr, MD

## 2017-11-18 ENCOUNTER — Encounter: Payer: Self-pay | Admitting: Internal Medicine

## 2017-11-18 NOTE — Progress Notes (Deleted)
ELECTROPHYSIOLOGY OFFICE NOTE  Patient ID: Jeffrey [name], MRN: [id number], DOB/AGE: 11-25-1965 52 y.o. Admit date: (Not on file) Date of Consult: 11/18/2017  Primary Physician: Cassandria Anger, MD Primary Cardiologist: new     Jeffrey [name] is a 52 y.o. male seen in followup of pacemaker implanted at [facility] following syncope sinus node dysfunction.  He was also diagnosed with long QT syndrome and has been on atenolol ever since.  There is no family history of long QT syndrome and no family history of syncope  At [facility] with generator replacement [employer] Jude  2006. [place] 2019  He is a longtime recovered opiate addict.  He has been on methadone for more than 2 decades  He has PTSD and anxiety.  He is taking care of his parents.  He has moderate exercise intolerance without edema and without chest pain  Past Medical History:  Diagnosis Date  . Acute sinusitis 10/29/2016   8/18  . Anxiety   . Cerumen impaction 10/29/2016   2018  . Long Q-T syndrome 12/19/2014   Pacemaker Sunray low dose - no issues w/QT   . 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 Opioid dependence ([place]) 12/19/2014   x20 years On Methadone since 1993 - Methadone clinic   . Pacemaker    2006 St. Jude, Identity [address] [id number], serial # [id number]. St Jude rep must physically interrogate pacemaker.   Marland Kitchen PTSD (post-traumatic stress disorder) 12/19/2014   On Klonopin, Celexa  Potential benefits of a long term benzodiazepines  use as well as potential risks  and complications were explained to the patient and were aknowledged.   . Skin abscess 03/09/2015   11/16 - small   . Substance abuse ([place])    opioids  . Tremor 07/17/2016   ?etiology       Surgical History:  Past Surgical History:  Procedure Laterality Date  . HERNIA REPAIR Right   . LIVER SURGERY    . PPM GENERATOR CHANGEOUT N/A 08/14/2017   Procedure: PPM GENERATOR CHANGEOUT;  Surgeon: Deboraha Sprang, MD;   Location: [facility];  Service: Cardiovascular;  Laterality: N/A;     Home Meds: Prior to Admission medications   Medication Sig Start Date End Date Taking? Authorizing Provider  atenolol (TENORMIN) 50 MG tablet Take 50 mg by mouth daily.    [provider]  Cholecalciferol (VITAMIN D3) 2000 UNITS capsule Take 1 capsule (2,000 Units total) by mouth daily. 12/19/14   Plotnikov, Evie Lacks, MD  citalopram (CELEXA) 40 MG tablet Take 1 tablet (40 mg total) by mouth daily. 05/05/17   Plotnikov, Evie Lacks, MD  clonazePAM (KLONOPIN) 1 MG tablet Take 1 tablet (1 mg total) by mouth 2 (two) times daily. 08/03/17   Plotnikov, Evie Lacks, MD  diphenhydrAMINE (BENADRYL) 25 MG tablet Take 25 mg by mouth every 6 (six) hours as needed for allergies. Reported on 06/11/2015    [provider]  ibuprofen (ADVIL,MOTRIN) 200 MG tablet Take 400 mg by mouth every 6 (six) hours as needed for headache or moderate pain.    [provider]  methadone (DOLOPHINE) 10 MG/5ML solution Take 50 mLs (100 mg total) by mouth daily. Per Methadone clinic Patient taking differently: Take 130 mg by mouth daily. Per Methadone clinic 12/19/14   Plotnikov, Evie Lacks, MD    Allergies:  Allergies  Allergen Reactions  . Codeine Itching  . Penicillins Hives  Has patient had a PCN reaction causing immediate rash, facial/tongue/throat swelling, SOB or lightheadedness with hypotension: unknown Has patient had a PCN reaction causing severe rash involving mucus membranes or skin necrosis: unknown Has patient had a PCN reaction that required hospitalization unknown Has patient had a PCN reaction occurring within the last 10 years: No If all of the above answers are "NO", then may proceed with Cephalosporin use.     Social History   Socioeconomic History  . Marital status: Divorced    Spouse name: Not on file  . Number of children: Not on file  . Years of education: Not on file  . Highest education  level: Not on file  Occupational History  . Not on file  Social Needs  . Financial resource strain: Not on file  . Food insecurity:    Worry: Not on file    Inability: Not on file  . Transportation needs:    Medical: Not on file    Non-medical: Not on file  Tobacco Use  . Smoking status: Never Smoker  . Smokeless tobacco: Current User    Types: Chew  Substance and Sexual Activity  . Alcohol use: No    Alcohol/week: 0.0 standard drinks  . Drug use: No    Comment: methadone clinic  . Sexual activity: Not Currently  Lifestyle  . Physical activity:    Days per week: Not on file    Minutes per session: Not on file  . Stress: Not on file  Relationships  . Social connections:    Talks on phone: Not on file    Gets together: Not on file    Attends religious service: Not on file    Active member of club or organization: Not on file    Attends meetings of clubs or organizations: Not on file    Relationship status: Not on file  . Intimate partner violence:    Fear of current or ex partner: Not on file    Emotionally abused: Not on file    Physically abused: Not on file    Forced sexual activity: Not on file  Other Topics Concern  . Not on file  Social History Narrative  . Not on file     Family History  Problem Relation Age of Onset  . Depression Mother   . Diabetes Mother   . Arthritis Mother   . Arthritis Father      ROS:  Please see the history of present illness.    All other systems reviewed and negative.    Physical Exam: There were no vitals taken for this visit. General: Well developed, well nourished male in no acute distress. Head: Normocephalic, atraumatic, sclera non-icteric, no xanthomas, nares are without discharge. EENT: normal  Lymph Nodes:  none Neck: Negative for carotid bruits. JVD not elevated. Back:without scoliosis kyphosis Lungs: Clear bilaterally to auscultation without wheezes, rales, or rhonchi. Breathing is unlabored.Device pocket well  healed; without hematoma or erythema.  There is no tethering  Heart: RRR with S1 S2. No murmur . No rubs, or gallops appreciated. Abdomen: Soft, non-tender, non-distended with normoactive bowel sounds. No hepatomegaly. No rebound/guarding. No obvious abdominal masses. Msk:  Strength and tone appear normal for age. Extremities: No clubbing or cyanosis. No edema.  Distal pedal pulses are 2+ and equal bilaterally. Skin: Warm and Dry Neuro: Alert and oriented X 3. CN III-XII intact Grossly normal sensory and motor function .  He has a tremor involving his lips Psych:  Responds to  questions appropriately with a normal affect.      Labs: Cardiac Enzymes No results for input(s): CKTOTAL, CKMB, TROPONINI in the last 72 hours. CBC Lab Results  Component Value Date   WBC 8.1 08/05/2017   HGB 14.0 08/05/2017   HCT 41.3 08/05/2017   MCV 85 08/05/2017   PLT 218 08/05/2017   PROTIME: No results for input(s): LABPROT, INR in the last 72 hours. Chemistry No results for input(s): NA, K, CL, CO2, BUN, CREATININE, CALCIUM, PROT, BILITOT, ALKPHOS, ALT, AST, GLUCOSE in the last 168 hours.  Invalid input(s): LABALBU Lipids Lab Results  Component Value Date   CHOL 168 09/28/2009   HDL 32 (L) 09/28/2009   LDLCALC 92 09/28/2009   TRIG 218 (H) 09/28/2009   BNP Pro B Natriuretic peptide (BNP)  Date/Time Value Ref Range Status  12/13/2008 09:38 AM 39.0 0.0 - 100.0 pg/mL Final  06/16/2008 09:19 PM 12.5 0.0 - 100.0 pg/mL Final   Thyroid Function Tests: No results for input(s): TSH, T4TOTAL, T3FREE, THYROIDAB in the last 72 hours.  Invalid input(s): FREET3 Miscellaneous Lab Results  Component Value Date   DDIMER  12/13/2008    <0.22        AT THE INHOUSE ESTABLISHED CUTOFF VALUE OF 0.48 ug/mL FEU, THIS ASSAY HAS BEEN DOCUMENTED IN THE LITERATURE TO HAVE A SENSITIVITY AND NEGATIVE PREDICTIVE VALUE OF AT LEAST 98 TO 99%.  THE TEST RESULT SHOULD BE CORRELATED WITH AN ASSESSMENT OF THE  CLINICAL PROBABILITY OF DVT / VTE.    Radiology/Studies:  No results found.  EKG: Sinus at 70 Intervals 15/10/43 with a QTC of 464  Chest Xray personally reviewed dual-chamber pacemaker in place Assessment and Plan:  Pacemaker-[employer] at [facility]  Sinus node dysfunction  Long QT syndrome diagnosed in [place] without accompanying family history  Methadone long-term for opiate addiction  PTSD/anxiety  Tremor  Patient has reached ERI.  His ventricular lead has a high threshold but he uses it almost never.  The plan would be to replace his device without revising his lead.  We have reviewed the benefits and risks of generator replacement.  These include but are not limited to lead fracture and infection.  The patient understands, agrees and is willing to proceed.    He will be driving himself.  We will use local anesthetic.  [name]

## 2018-02-17 ENCOUNTER — Ambulatory Visit (INDEPENDENT_AMBULATORY_CARE_PROVIDER_SITE_OTHER): Payer: Self-pay | Admitting: Internal Medicine

## 2018-02-17 ENCOUNTER — Telehealth: Payer: Self-pay | Admitting: Cardiology

## 2018-02-17 ENCOUNTER — Encounter: Payer: Self-pay | Admitting: Internal Medicine

## 2018-02-17 DIAGNOSIS — F1121 Opioid dependence, in remission: Secondary | ICD-10-CM

## 2018-02-17 DIAGNOSIS — F4310 Post-traumatic stress disorder, unspecified: Secondary | ICD-10-CM

## 2018-02-17 DIAGNOSIS — R251 Tremor, unspecified: Secondary | ICD-10-CM

## 2018-02-17 MED ORDER — CITALOPRAM HYDROBROMIDE 40 MG PO TABS
40.0000 mg | ORAL_TABLET | Freq: Every day | ORAL | 5 refills | Status: DC
Start: 2018-02-17 — End: 2018-11-22

## 2018-02-17 MED ORDER — CLONAZEPAM 1 MG PO TABS: 1.0000 mg | ORAL_TABLET | Freq: Two times a day (BID) | ORAL | 2 refills | Status: DC

## 2018-02-17 NOTE — Assessment & Plan Note (Signed)
Mild.

## 2018-02-17 NOTE — Assessment & Plan Note (Signed)
On Methadone since 1993 - Methadone clinic

## 2018-02-17 NOTE — Progress Notes (Signed)
Subjective:  Patient ID: Jeffrey [name], male    DOB: 02-04-1966  Age: 52 y.o. MRN: [id number]  CC: No chief complaint on file.   HPI Jeffrey [name] presents for PTSD w/anxiety, depression f/u. Pt declined a flu shot  Outpatient Medications Prior to Visit  Medication Sig Dispense Refill  . atenolol (TENORMIN) 50 MG tablet Take 50 mg by mouth daily.    . Cholecalciferol (VITAMIN D3) 2000 UNITS capsule Take 1 capsule (2,000 Units total) by mouth daily. 100 capsule 3  . citalopram (CELEXA) 40 MG tablet Take 1 tablet (40 mg total) by mouth daily. 30 tablet 5  . clonazePAM (KLONOPIN) 1 MG tablet Take 1 tablet (1 mg total) by mouth 2 (two) times daily. 60 tablet 2  . ibuprofen (ADVIL,MOTRIN) 200 MG tablet Take 200-400 mg by mouth every 6 (six) hours as needed for headache or moderate pain.     . Methadone HCl (DOLOPHINE PO) Take 130 mg by mouth daily.     No facility-administered medications prior to visit.     ROS: Review of Systems  Constitutional: Negative for appetite change, fatigue and unexpected weight change.  HENT: Negative for congestion, nosebleeds, sneezing, sore throat and trouble swallowing.   Eyes: Negative for itching and visual disturbance.  Respiratory: Negative for cough.   Cardiovascular: Negative for chest pain, palpitations and leg swelling.  Gastrointestinal: Negative for abdominal distention, blood in stool, diarrhea and nausea.  Genitourinary: Negative for frequency and hematuria.  Musculoskeletal: Negative for back pain, gait problem, joint swelling and neck pain.  Skin: Negative for rash.  Neurological: Negative for dizziness, tremors, speech difficulty and weakness.  Psychiatric/Behavioral: Positive for decreased concentration. Negative for agitation, dysphoric mood, sleep disturbance and suicidal ideas. The patient is nervous/anxious.     Objective:  BP 128/82 (BP Location: Left Arm, Patient Position: Sitting, Cuff Size: Large)   Pulse 64   Temp  98.1 F (36.7 C) (Oral)   Ht 6\' 1"  (1.854 m)   Wt 260 lb (117.9 kg)   SpO2 95%   BMI 34.30 kg/m   BP Readings from Last 3 Encounters:  02/17/18 128/82  11/09/17 134/84  08/14/17 133/75    Wt Readings from Last 3 Encounters:  02/17/18 260 lb (117.9 kg)  11/09/17 248 lb (112.5 kg)  08/14/17 250 lb (113.4 kg)    Physical Exam  Constitutional: He is oriented to person, place, and time. He appears well-developed. No distress.  NAD  HENT:  Mouth/Throat: Oropharynx is clear and moist.  Eyes: Pupils are equal, round, and reactive to light. Conjunctivae are normal.  Neck: Normal range of motion. No JVD present. No thyromegaly present.  Cardiovascular: Normal rate, regular rhythm, normal heart sounds and intact distal pulses. Exam reveals no gallop and no friction rub.  No murmur heard. Pulmonary/Chest: Effort normal and breath sounds normal. No respiratory distress. He has no wheezes. He has no rales. He exhibits no tenderness.  Abdominal: Soft. Bowel sounds are normal. He exhibits no distension and no mass. There is no tenderness. There is no rebound and no guarding.  Musculoskeletal: Normal range of motion. He exhibits no edema or tenderness.  Lymphadenopathy:    He has no cervical adenopathy.  Neurological: He is alert and oriented to person, place, and time. He has normal reflexes. No cranial nerve deficit. He exhibits normal muscle tone. He displays a negative Romberg sign. Coordination and gait normal.  Skin: Skin is warm and dry. No rash noted.  Psychiatric: He has a  normal mood and affect. His behavior is normal. Judgment and thought content normal.  tremor L side Obese  Lab Results  Component Value Date   WBC 8.1 08/05/2017   HGB 14.0 08/05/2017   HCT 41.3 08/05/2017   PLT 218 08/05/2017   GLUCOSE 94 08/05/2017   CHOL 168 09/28/2009   TRIG 218 (H) 09/28/2009   HDL 32 (L) 09/28/2009   LDLCALC 92 09/28/2009   ALT 24 09/28/2009   AST 27 09/28/2009   NA 141 08/05/2017    K 4.5 08/05/2017   CL 101 08/05/2017   CREATININE 1.37 (H) 08/05/2017   BUN 12 08/05/2017   CO2 26 08/05/2017   TSH 2.65 07/17/2016   PSA 0.19 06/16/2008   INR 1.0 08/05/2017   HGBA1C  12/13/2008    5.8 (NOTE) The ADA recommends the following therapeutic goal for glycemic control related to Hgb A1c measurement: Goal of therapy: <6.5 Hgb A1c  Reference: American Diabetes Association: Clinical Practice Recommendations 2010, Diabetes Care, 2010, 33: (Suppl  1).   MICROALBUR 1.03 06/06/2009    No results found.  Assessment & Plan:   There are no diagnoses linked to this encounter.   No orders of the defined types were placed in this encounter.    Follow-up: No follow-ups on file.  Jeffrey Kehr, MD

## 2018-02-17 NOTE — Assessment & Plan Note (Signed)
On Klonopin, Celexa  Potential benefits of a long term benzodiazepines  use as well as potential risks  and complications were explained to the patient and were aknowledged.

## 2018-02-17 NOTE — Telephone Encounter (Signed)
LMOVM reminding pt to send remote transmission.   

## 2018-02-25 ENCOUNTER — Encounter: Payer: Self-pay | Admitting: Cardiology

## 2018-05-24 ENCOUNTER — Ambulatory Visit (INDEPENDENT_AMBULATORY_CARE_PROVIDER_SITE_OTHER): Payer: Self-pay | Admitting: Internal Medicine

## 2018-05-24 ENCOUNTER — Encounter: Payer: Self-pay | Admitting: Internal Medicine

## 2018-05-24 DIAGNOSIS — F1121 Opioid dependence, in remission: Secondary | ICD-10-CM

## 2018-05-24 DIAGNOSIS — F4310 Post-traumatic stress disorder, unspecified: Secondary | ICD-10-CM

## 2018-05-24 MED ORDER — CLONAZEPAM 1 MG PO TABS: 1.0000 mg | ORAL_TABLET | Freq: Two times a day (BID) | ORAL | 2 refills | Status: DC

## 2018-05-24 MED ORDER — ATENOLOL 50 MG PO TABS: 50.0000 mg | ORAL_TABLET | Freq: Every day | ORAL | 11 refills | Status: DC
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
## 2018-05-24 NOTE — Assessment & Plan Note (Signed)
On Methadone since 1993 - Methadone clinic Drug screens at [facility] monthly
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
## 2018-08-24 ENCOUNTER — Encounter: Payer: Self-pay | Admitting: Internal Medicine

## 2018-08-24 ENCOUNTER — Ambulatory Visit (INDEPENDENT_AMBULATORY_CARE_PROVIDER_SITE_OTHER): Payer: Self-pay | Admitting: Internal Medicine

## 2018-08-24 DIAGNOSIS — F4310 Post-traumatic stress disorder, unspecified: Secondary | ICD-10-CM

## 2018-08-24 DIAGNOSIS — F1121 Opioid dependence, in remission: Secondary | ICD-10-CM

## 2018-08-24 MED ORDER — CLONAZEPAM 1 MG PO TABS: 1.0000 mg | ORAL_TABLET | Freq: Two times a day (BID) | ORAL | 2 refills | Status: DC

## 2018-08-24 NOTE — Assessment & Plan Note (Signed)
Clonazepam prescription was renewed.  Continue with Celexa

## 2018-08-24 NOTE — Progress Notes (Signed)
Virtual Visit via Video Note  I connected with Lilia Argue Witters on 08/24/18 at  7:50 AM EDT by a video enabled telemedicine application and verified that I am speaking with the correct person using two identifiers.   I discussed the limitations of evaluation and management by telemedicine and the availability of in person appointments. The patient expressed understanding and agreed to proceed.  History of Present Illness: We need to follow-up on anxiety (he is out all clonazepam as of today) and opioid dependence -he continues to go to the methadone clinic 4 days a week  There has been no runny nose, cough, chest pain, shortness of breath, abdominal pain, diarrhea, constipation, arthralgias, skin rashes.   Observations/Objective: The patient appears to be in no acute distress, looks well.  Noticeable tongue tremor  Assessment and Plan:  See my Assessment and Plan. Follow Up Instructions:    I discussed the assessment and treatment plan with the patient. The patient was provided an opportunity to ask questions and all were answered. The patient agreed with the plan and demonstrated an understanding of the instructions.   The patient was advised to call back or seek an in-person evaluation if the symptoms worsen or if the condition fails to improve as anticipated.  I provided face-to-face time during this encounter. We were at different locations.   Walker Kehr, MD

## 2018-08-24 NOTE — Assessment & Plan Note (Signed)
On Methadone since 1993 - Methadone clinic - 4 days a week Drug screens at [facility] monthly 

## 2018-11-22 ENCOUNTER — Ambulatory Visit (INDEPENDENT_AMBULATORY_CARE_PROVIDER_SITE_OTHER): Payer: Self-pay | Admitting: Internal Medicine

## 2018-11-22 ENCOUNTER — Other Ambulatory Visit: Payer: Self-pay

## 2018-11-22 ENCOUNTER — Encounter: Payer: Self-pay | Admitting: Internal Medicine

## 2018-11-22 DIAGNOSIS — F1121 Opioid dependence, in remission: Secondary | ICD-10-CM

## 2018-11-22 DIAGNOSIS — E669 Obesity, unspecified: Secondary | ICD-10-CM | POA: Insufficient documentation

## 2018-11-22 DIAGNOSIS — E66811 Obesity, class 1: Secondary | ICD-10-CM

## 2018-11-22 DIAGNOSIS — F4310 Post-traumatic stress disorder, unspecified: Secondary | ICD-10-CM

## 2018-11-22 MED ORDER — CITALOPRAM HYDROBROMIDE 40 MG PO TABS: 40.0000 mg | ORAL_TABLET | Freq: Every day | ORAL | 5 refills | Status: DC

## 2018-11-22 MED ORDER — CLONAZEPAM 1 MG PO TABS: 1.0000 mg | ORAL_TABLET | Freq: Two times a day (BID) | ORAL | 2 refills | Status: DC

## 2018-11-22 NOTE — Progress Notes (Signed)
Subjective:  Patient ID: Jeffrey [name], male    DOB: Aug 06, 1965  Age: 53 y.o. MRN: [id number]  CC: No chief complaint on file.   HPI Jeffrey [name] presents for anxiety, HTN, obesity f/u  Outpatient Medications Prior to Visit  Medication Sig Dispense Refill  . atenolol (TENORMIN) 50 MG tablet Take 1 tablet (50 mg total) by mouth daily. 30 tablet 11  . Cholecalciferol (VITAMIN D3) 2000 UNITS capsule Take 1 capsule (2,000 Units total) by mouth daily. 100 capsule 3  . citalopram (CELEXA) 40 MG tablet Take 1 tablet (40 mg total) by mouth daily. 30 tablet 5  . clonazePAM (KLONOPIN) 1 MG tablet Take 1 tablet (1 mg total) by mouth 2 (two) times daily. 60 tablet 2  . ibuprofen (ADVIL,MOTRIN) 200 MG tablet Take 200-400 mg by mouth every 6 (six) hours as needed for headache or moderate pain.     . Methadone HCl (DOLOPHINE PO) Take 130 mg by mouth daily.     No facility-administered medications prior to visit.     ROS: Review of Systems  Constitutional: Positive for fatigue. Negative for appetite change and unexpected weight change.  HENT: Negative for congestion, nosebleeds, sneezing, sore throat and trouble swallowing.   Eyes: Negative for itching and visual disturbance.  Respiratory: Negative for cough.   Cardiovascular: Negative for chest pain, palpitations and leg swelling.  Gastrointestinal: Negative for abdominal distention, blood in stool, diarrhea and nausea.  Genitourinary: Negative for frequency and hematuria.  Musculoskeletal: Negative for back pain, gait problem, joint swelling and neck pain.  Skin: Negative for rash.  Neurological: Positive for tremors. Negative for dizziness, speech difficulty and weakness.  Psychiatric/Behavioral: Positive for decreased concentration. Negative for agitation, dysphoric mood and sleep disturbance. The patient is nervous/anxious.     Objective:  BP 140/90 (BP Location: Left Arm, Patient Position: Sitting, Cuff Size: Large)   Pulse 81    Temp 98.7 F (37.1 C) (Oral)   Ht 6\' 1"  (1.854 m)   Wt 246 lb (111.6 kg)   SpO2 98%   BMI 32.46 kg/m   BP Readings from Last 3 Encounters:  11/22/18 140/90  05/24/18 (!) 156/88  02/17/18 128/82    Wt Readings from Last 3 Encounters:  11/22/18 246 lb (111.6 kg)  05/24/18 251 lb (113.9 kg)  02/17/18 260 lb (117.9 kg)    Physical Exam Constitutional:      General: He is not in acute distress.    Appearance: He is well-developed.     Comments: NAD  Eyes:     Conjunctiva/sclera: Conjunctivae normal.     Pupils: Pupils are equal, round, and reactive to light.  Neck:     Musculoskeletal: Normal range of motion.     Thyroid: No thyromegaly.     Vascular: No JVD.  Cardiovascular:     Rate and Rhythm: Normal rate and regular rhythm.     Heart sounds: Normal heart sounds. No murmur. No friction rub. No gallop.   Pulmonary:     Effort: Pulmonary effort is normal. No respiratory distress.     Breath sounds: Normal breath sounds. No wheezing or rales.  Chest:     Chest wall: No tenderness.  Abdominal:     General: Bowel sounds are normal. There is no distension.     Palpations: Abdomen is soft. There is no mass.     Tenderness: There is no abdominal tenderness. There is no guarding or rebound.  Musculoskeletal: Normal range of motion.  General: No tenderness.  Lymphadenopathy:     Cervical: No cervical adenopathy.  Skin:    General: Skin is warm and dry.     Findings: No rash.  Neurological:     Mental Status: He is alert and oriented to person, place, and time.     Cranial Nerves: No cranial nerve deficit.     Motor: No abnormal muscle tone.     Coordination: Coordination normal.     Gait: Gait abnormal.     Deep Tendon Reflexes: Reflexes are normal and symmetric.  Psychiatric:        Behavior: Behavior normal.        Thought Content: Thought content normal.        Judgment: Judgment normal.   tremor Obese   Lab Results  Component Value Date   WBC 8.1  08/05/2017   HGB 14.0 08/05/2017   HCT 41.3 08/05/2017   PLT 218 08/05/2017   GLUCOSE 94 08/05/2017   CHOL 168 09/28/2009   TRIG 218 (H) 09/28/2009   HDL 32 (L) 09/28/2009   LDLCALC 92 09/28/2009   ALT 24 09/28/2009   AST 27 09/28/2009   NA 141 08/05/2017   K 4.5 08/05/2017   CL 101 08/05/2017   CREATININE 1.37 (H) 08/05/2017   BUN 12 08/05/2017   CO2 26 08/05/2017   TSH 2.65 07/17/2016   PSA 0.19 06/16/2008   INR 1.0 08/05/2017   HGBA1C  12/13/2008    5.8 (NOTE) The ADA recommends the following therapeutic goal for glycemic control related to Hgb A1c measurement: Goal of therapy: <6.5 Hgb A1c  Reference: American Diabetes Association: Clinical Practice Recommendations 2010, Diabetes Care, 2010, 33: (Suppl  1).   MICROALBUR 1.03 06/06/2009    No results found.  Assessment & Plan:   There are no diagnoses linked to this encounter.   No orders of the defined types were placed in this encounter.    Follow-up: No follow-ups on file.  Walker Kehr, MD

## 2018-11-22 NOTE — Assessment & Plan Note (Signed)
Chronic  Diet discussed

## 2018-11-22 NOTE — Patient Instructions (Signed)

## 2018-11-22 NOTE — Assessment & Plan Note (Signed)
On Methadone 

## 2018-11-22 NOTE — Assessment & Plan Note (Signed)
On Klonopin, Celexa  Potential benefits of a long term benzodiazepines  use as well as potential risks  and complications were explained to the patient and were aknowledged.

## 2019-02-16 ENCOUNTER — Ambulatory Visit: Payer: Self-pay | Admitting: Internal Medicine

## 2019-02-21 ENCOUNTER — Other Ambulatory Visit: Payer: Self-pay

## 2019-02-21 ENCOUNTER — Encounter: Payer: Self-pay | Admitting: Internal Medicine

## 2019-02-21 ENCOUNTER — Ambulatory Visit (INDEPENDENT_AMBULATORY_CARE_PROVIDER_SITE_OTHER): Payer: Self-pay | Admitting: Internal Medicine

## 2019-02-21 DIAGNOSIS — F4310 Post-traumatic stress disorder, unspecified: Secondary | ICD-10-CM

## 2019-02-21 DIAGNOSIS — R251 Tremor, unspecified: Secondary | ICD-10-CM

## 2019-02-21 MED ORDER — CLONAZEPAM 1 MG PO TABS: 1.0000 mg | ORAL_TABLET | Freq: Two times a day (BID) | ORAL | 2 refills | Status: DC

## 2019-02-21 NOTE — Progress Notes (Signed)
Subjective:  Patient ID: Jeffrey [name], male    DOB: 1966-02-03  Age: 53 y.o. MRN: [id number]  CC: No chief complaint on file.   HPI [name] presents for anxiety, pain, twitching   Outpatient Medications Prior to Visit  Medication Sig Dispense Refill  . atenolol (TENORMIN) 50 MG tablet Take 1 tablet (50 mg total) by mouth daily. 30 tablet 11  . Cholecalciferol (VITAMIN D3) 2000 UNITS capsule Take 1 capsule (2,000 Units total) by mouth daily. 100 capsule 3  . citalopram (CELEXA) 40 MG tablet Take 1 tablet (40 mg total) by mouth daily. 30 tablet 5  . clonazePAM (KLONOPIN) 1 MG tablet Take 1 tablet (1 mg total) by mouth 2 (two) times daily. 60 tablet 2  . ibuprofen (ADVIL,MOTRIN) 200 MG tablet Take 200-400 mg by mouth every 6 (six) hours as needed for headache or moderate pain.     . Methadone HCl (DOLOPHINE PO) Take 130 mg by mouth daily.     No facility-administered medications prior to visit.     ROS: Review of Systems  Constitutional: Positive for unexpected weight change. Negative for appetite change and fatigue.  HENT: Negative for congestion, nosebleeds, sneezing, sore throat and trouble swallowing.   Eyes: Negative for itching and visual disturbance.  Respiratory: Negative for cough.   Cardiovascular: Negative for chest pain, palpitations and leg swelling.  Gastrointestinal: Negative for abdominal distention, blood in stool, diarrhea and nausea.  Genitourinary: Negative for frequency and hematuria.  Musculoskeletal: Negative for back pain, gait problem, joint swelling and neck pain.  Skin: Negative for rash.  Neurological: Positive for tremors. Negative for dizziness, speech difficulty and weakness.  Psychiatric/Behavioral: Negative for agitation, dysphoric mood, sleep disturbance and suicidal ideas. The patient is not nervous/anxious.     Objective:  BP (!) 144/86 (BP Location: Left Arm, Patient Position: Sitting, Cuff Size: Large)   Pulse (!) 107   Temp  98.7 F (37.1 C) (Oral)   Ht 6\' 1"  (1.854 m)   Wt 252 lb (114.3 kg)   SpO2 98%   BMI 33.25 kg/m   BP Readings from Last 3 Encounters:  02/21/19 (!) 144/86  11/22/18 140/90  05/24/18 (!) 156/88    Wt Readings from Last 3 Encounters:  02/21/19 252 lb (114.3 kg)  11/22/18 246 lb (111.6 kg)  05/24/18 251 lb (113.9 kg)    Physical Exam Constitutional:      General: He is not in acute distress.    Appearance: He is well-developed. He is obese.     Comments: NAD  Eyes:     Conjunctiva/sclera: Conjunctivae normal.     Pupils: Pupils are equal, round, and reactive to light.  Neck:     Musculoskeletal: Normal range of motion.     Thyroid: No thyromegaly.     Vascular: No JVD.  Cardiovascular:     Rate and Rhythm: Normal rate and regular rhythm.     Heart sounds: Normal heart sounds. No murmur. No friction rub. No gallop.   Pulmonary:     Effort: Pulmonary effort is normal. No respiratory distress.     Breath sounds: Normal breath sounds. No wheezing or rales.  Chest:     Chest wall: No tenderness.  Abdominal:     General: Bowel sounds are normal. There is no distension.     Palpations: Abdomen is soft. There is no mass.     Tenderness: There is no abdominal tenderness. There is no guarding or rebound.  Musculoskeletal: Normal range  of motion.        General: No tenderness.  Lymphadenopathy:     Cervical: No cervical adenopathy.  Skin:    General: Skin is warm and dry.     Findings: No rash.  Neurological:     Mental Status: He is alert and oriented to person, place, and time.     Cranial Nerves: No cranial nerve deficit.     Motor: No abnormal muscle tone.     Coordination: Coordination normal.     Gait: Gait normal.     Deep Tendon Reflexes: Reflexes are normal and symmetric.  Psychiatric:        Behavior: Behavior normal.        Thought Content: Thought content normal.        Judgment: Judgment normal.   tremor  Lab Results  Component Value Date   WBC 8.1  08/05/2017   HGB 14.0 08/05/2017   HCT 41.3 08/05/2017   PLT 218 08/05/2017   GLUCOSE 94 08/05/2017   CHOL 168 09/28/2009   TRIG 218 (H) 09/28/2009   HDL 32 (L) 09/28/2009   LDLCALC 92 09/28/2009   ALT 24 09/28/2009   AST 27 09/28/2009   NA 141 08/05/2017   K 4.5 08/05/2017   CL 101 08/05/2017   CREATININE 1.37 (H) 08/05/2017   BUN 12 08/05/2017   CO2 26 08/05/2017   TSH 2.65 07/17/2016   PSA 0.19 06/16/2008   INR 1.0 08/05/2017   HGBA1C  12/13/2008    5.8 (NOTE) The ADA recommends the following therapeutic goal for glycemic control related to Hgb A1c measurement: Goal of therapy: <6.5 Hgb A1c  Reference: American Diabetes Association: Clinical Practice Recommendations 2010, Diabetes Care, 2010, 33: (Suppl  1).   MICROALBUR 1.03 06/06/2009    No results found.  Assessment & Plan:   There are no diagnoses linked to this encounter.   No orders of the defined types were placed in this encounter.    Follow-up: No follow-ups on file.  Walker Kehr, MD

## 2019-02-21 NOTE — Assessment & Plan Note (Signed)
Clonazepam, Celexa

## 2019-02-21 NOTE — Assessment & Plan Note (Signed)
Clonazepam 

## 2019-05-13 ENCOUNTER — Ambulatory Visit (INDEPENDENT_AMBULATORY_CARE_PROVIDER_SITE_OTHER): Payer: Self-pay | Admitting: Internal Medicine

## 2019-05-13 ENCOUNTER — Ambulatory Visit: Payer: Self-pay | Admitting: Internal Medicine

## 2019-05-13 ENCOUNTER — Other Ambulatory Visit: Payer: Self-pay

## 2019-05-13 ENCOUNTER — Encounter: Payer: Self-pay | Admitting: Internal Medicine

## 2019-05-13 DIAGNOSIS — F4310 Post-traumatic stress disorder, unspecified: Secondary | ICD-10-CM

## 2019-05-13 MED ORDER — CLONAZEPAM 1 MG PO TABS: 1.0000 mg | ORAL_TABLET | Freq: Two times a day (BID) | ORAL | 2 refills | Status: DC

## 2019-05-13 NOTE — Assessment & Plan Note (Signed)
We will continue with Celexa Will renew Klonopin Follow-up in 3 months

## 2019-05-13 NOTE — Progress Notes (Signed)
Virtual Visit via Telephone Note  I connected with Jeffrey [name] on 05/13/19 at 10:20 AM EST by telephone and verified that I am speaking with the correct person using two identifiers.   I discussed the limitations, risks, security and privacy concerns of performing an evaluation and management service by telephone and the availability of in person appointments. I also discussed with the patient that there may be a patient responsible charge related to this service. The patient expressed understanding and agreed to proceed.   History of Present Illness:   Follow-up on anxiety; things are stable We need to renew Klonopin  Observations/Objective:  Jeffrey [name] sounds good on the phone Assessment and Plan:  See plan Follow Up Instructions:    I discussed the assessment and treatment plan with the patient. The patient was provided an opportunity to ask questions and all were answered. The patient agreed with the plan and demonstrated an understanding of the instructions.   The patient was advised to call back or seek an in-person evaluation if the symptoms worsen or if the condition fails to improve as anticipated.  I provided 11 minutes of non-face-to-face time during this encounter.   Walker Kehr, MD

## 2019-05-17 ENCOUNTER — Ambulatory Visit: Payer: Self-pay | Admitting: Internal Medicine

## 2019-05-18 ENCOUNTER — Other Ambulatory Visit: Payer: Self-pay | Admitting: Internal Medicine

## 2019-08-10 ENCOUNTER — Other Ambulatory Visit: Payer: Self-pay

## 2019-08-10 ENCOUNTER — Ambulatory Visit (INDEPENDENT_AMBULATORY_CARE_PROVIDER_SITE_OTHER): Payer: Self-pay | Admitting: Internal Medicine

## 2019-08-10 ENCOUNTER — Encounter: Payer: Self-pay | Admitting: Internal Medicine

## 2019-08-10 VITALS — BP 140/82 | HR 69 | Temp 98.4°F | Ht 73.0 in | Wt 265.0 lb

## 2019-08-10 DIAGNOSIS — Z950 Presence of cardiac pacemaker: Secondary | ICD-10-CM

## 2019-08-10 DIAGNOSIS — F4310 Post-traumatic stress disorder, unspecified: Secondary | ICD-10-CM

## 2019-08-10 DIAGNOSIS — F1121 Opioid dependence, in remission: Secondary | ICD-10-CM

## 2019-08-10 DIAGNOSIS — Z0000 Encounter for general adult medical examination without abnormal findings: Secondary | ICD-10-CM | POA: Insufficient documentation

## 2019-08-10 MED ORDER — CITALOPRAM HYDROBROMIDE 40 MG PO TABS: 40.0000 mg | ORAL_TABLET | Freq: Every day | ORAL | 5 refills | Status: DC

## 2019-08-10 MED ORDER — CLONAZEPAM 1 MG PO TABS: 1.0000 mg | ORAL_TABLET | Freq: Two times a day (BID) | ORAL | 2 refills | Status: DC

## 2019-08-10 NOTE — Assessment & Plan Note (Signed)
[facility] clinic - 4 days a week

## 2019-08-10 NOTE — Assessment & Plan Note (Addendum)
On Klonopin, Celexa  Potential benefits of a long term benzodiazepines  use as well as potential risks  and complications were explained to the patient and were aknowledged. [facility]. Ex- EMT - works part time.  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

## 2019-08-10 NOTE — Assessment & Plan Note (Addendum)
Loose wt. Jeffrey [name] was advised to have regular labs done - ordered. Needs colon cancer screening. He had a COVID19 first shot done.

## 2019-08-10 NOTE — Assessment & Plan Note (Signed)
F/u w/Cardiology 

## 2019-08-10 NOTE — Progress Notes (Signed)
Subjective:  Patient ID: Jeffrey [name], male    DOB: Aug 23, 1965  Age: 54 y.o. MRN: [id number]  CC: No chief complaint on file.   HPI Jeffrey [name] presents for anxiety. No change. Ex- EMT works part time.  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   Outpatient Medications Prior to Visit  Medication Sig Dispense Refill  . atenolol (TENORMIN) 50 MG tablet TAKE 1 TABLET BY MOUTH DAILY 30 tablet 11  . Cholecalciferol (VITAMIN D3) 2000 UNITS capsule Take 1 capsule (2,000 Units total) by mouth daily. 100 capsule 3  . citalopram (CELEXA) 40 MG tablet TAKE 1 TABLET (40 MG TOTAL) BY MOUTH DAILY. 30 tablet 5  . clonazePAM (KLONOPIN) 1 MG tablet Take 1 tablet (1 mg total) by mouth 2 (two) times daily. 60 tablet 2  . ibuprofen (ADVIL,MOTRIN) 200 MG tablet Take 200-400 mg by mouth every 6 (six) hours as needed for headache or moderate pain.     . Methadone HCl (DOLOPHINE PO) Take 130 mg by mouth daily.     No facility-administered medications prior to visit.    ROS: Review of Systems  Constitutional: Negative for appetite change, fatigue and unexpected weight change.  HENT: Negative for congestion, nosebleeds, sneezing, sore throat and trouble swallowing.   Eyes: Negative for itching and visual disturbance.  Respiratory: Negative for cough.   Cardiovascular: Negative for chest pain, palpitations and leg swelling.  Gastrointestinal: Negative for abdominal distention, blood in stool, diarrhea and nausea.  Genitourinary: Negative for frequency and hematuria.  Musculoskeletal: Negative for back pain, gait problem, joint swelling and neck pain.  Skin: Negative for rash.  Neurological: Positive for tremors. Negative for dizziness, speech difficulty and weakness.  Psychiatric/Behavioral: Negative for agitation, dysphoric mood and sleep disturbance. The patient is nervous/anxious.     Objective:  BP 140/82 (BP Location: Left Arm, Patient  Position: Sitting, Cuff Size: Normal)   Pulse 69   Temp 98.4 F (36.9 C) (Oral)   Ht 6\' 1"  (1.854 m)   Wt 265 lb (120.2 kg)   SpO2 98%   BMI 34.96 kg/m   BP Readings from Last 3 Encounters:  08/10/19 140/82  02/21/19 (!) 144/86  11/22/18 140/90    Wt Readings from Last 3 Encounters:  08/10/19 265 lb (120.2 kg)  02/21/19 252 lb (114.3 kg)  11/22/18 246 lb (111.6 kg)    Physical Exam Constitutional:      General: He is not in acute distress.    Appearance: He is well-developed. He is obese.     Comments: NAD  Eyes:     Conjunctiva/sclera: Conjunctivae normal.     Pupils: Pupils are equal, round, and reactive to light.  Neck:     Thyroid: No thyromegaly.     Vascular: No JVD.  Cardiovascular:     Rate and Rhythm: Normal rate and regular rhythm.     Heart sounds: Normal heart sounds. No murmur. No friction rub. No gallop.   Pulmonary:     Effort: Pulmonary effort is normal. No respiratory distress.     Breath sounds: Normal breath sounds. No wheezing or rales.  Chest:     Chest wall: No tenderness.  Abdominal:     General: Bowel sounds are normal. There is no distension.     Palpations: Abdomen is soft. There is no mass.     Tenderness: There is no abdominal tenderness. There is no guarding or rebound.  Musculoskeletal:        General: No tenderness. Normal range of motion.     Cervical back: Normal range of motion.  Lymphadenopathy:     Cervical: No cervical adenopathy.  Skin:    General: Skin is warm and dry.     Findings: No rash.  Neurological:     Mental Status: He is alert and oriented to person, place, and time.     Cranial Nerves: No cranial nerve deficit.     Motor: No abnormal muscle tone.     Coordination: Coordination normal.     Gait: Gait normal.     Deep Tendon Reflexes: Reflexes are normal and symmetric.  Psychiatric:        Behavior: Behavior normal.        Thought Content: Thought content normal.        Judgment: Judgment normal.    Tremors in the R LE  Lab Results  Component Value Date   WBC 8.1 08/05/2017   HGB 14.0 08/05/2017   HCT 41.3 08/05/2017   PLT 218 08/05/2017   GLUCOSE 94 08/05/2017   CHOL 168 09/28/2009   TRIG 218 (H) 09/28/2009   HDL 32 (L) 09/28/2009   LDLCALC 92 09/28/2009   ALT 24 09/28/2009   AST 27 09/28/2009   NA 141 08/05/2017   K 4.5 08/05/2017   CL 101 08/05/2017   CREATININE 1.37 (H) 08/05/2017   BUN 12 08/05/2017   CO2 26 08/05/2017   TSH 2.65 07/17/2016   PSA 0.19 06/16/2008   INR 1.0 08/05/2017   HGBA1C  12/13/2008    5.8 (NOTE) The ADA recommends the following therapeutic goal for glycemic control related to Hgb A1c measurement: Goal of therapy: <6.5 Hgb A1c  Reference: American Diabetes Association: Clinical Practice Recommendations 2010, Diabetes Care, 2010, 33: (Suppl  1).   MICROALBUR 1.03 06/06/2009    No results found.  Assessment & Plan:    Walker Kehr, MD

## 2019-11-10 ENCOUNTER — Ambulatory Visit (INDEPENDENT_AMBULATORY_CARE_PROVIDER_SITE_OTHER): Payer: Self-pay | Admitting: Internal Medicine

## 2019-11-10 ENCOUNTER — Encounter: Payer: Self-pay | Admitting: Internal Medicine

## 2019-11-10 ENCOUNTER — Other Ambulatory Visit: Payer: Self-pay

## 2019-11-10 DIAGNOSIS — R194 Change in bowel habit: Secondary | ICD-10-CM

## 2019-11-10 DIAGNOSIS — E669 Obesity, unspecified: Secondary | ICD-10-CM

## 2019-11-10 MED ORDER — HYOSCYAMINE SULFATE 0.125 MG PO TABS: 0.1250 mg | ORAL_TABLET | ORAL | 0 refills | Status: DC | PRN

## 2019-11-10 MED ORDER — CLONAZEPAM 1 MG PO TABS: 1.0000 mg | ORAL_TABLET | Freq: Two times a day (BID) | ORAL | 2 refills | Status: DC

## 2019-11-10 MED ORDER — ATENOLOL 50 MG PO TABS: 50.0000 mg | ORAL_TABLET | Freq: Every day | ORAL | 11 refills | Status: DC

## 2019-11-10 MED ORDER — CITALOPRAM HYDROBROMIDE 40 MG PO TABS: 40.0000 mg | ORAL_TABLET | Freq: Every day | ORAL | 5 refills | Status: DC

## 2019-11-10 NOTE — Patient Instructions (Signed)
Lactose Intolerance, Adult Lactose is a natural sugar that is found in dairy milk and dairy products such as cheese and yogurt. Lactose is digested by lactase, a protein (enzyme) in your small intestine. Some people do not produce enough lactase to digest lactose. This is called lactose intolerance. Lactose intolerance is different from milk allergy, which is a more serious reaction to the protein in milk. What are the causes? Causes of lactose intolerance may include:  Normal aging. The ability to produce lactase may lessen with age, causing lactose intolerance over time.  Being born without the ability to make lactase.  Digestive diseases such as gastroenteritis or inflammatory bowel disease (IBD).  Surgery or injury to your small intestine.  Infection in your intestines.  Certain antibiotic medicines and cancer treatments. What are the signs or symptoms? Lactose intolerance can cause discomfort within 30 minutes to 2 hours after you eat or drink something that contains lactose. Symptoms may include:  Nausea.  Diarrhea.  Cramps or pain in the abdomen.  A full, tight, or painful feeling in the abdomen (bloating).  Gas. How is this diagnosed? This condition may be diagnosed based on:  Your symptoms and medical history.  Lactose tolerance test. This test involves drinking a lactose solution and then having blood tests to measure the amount of glucose in your blood. If your blood glucose level does not go up, it means your body is not able to digest the lactose.  Lactose breath test (hydrogen breath test). This test involves drinking a lactose solution and then exhaling into a type of bag while you digest the solution. Having a lot of hydrogen in your breath can be a sign of lactose intolerance.  Stool acidity test. This involves drinking a lactose solution and then having your stool samples tested for bacteria. Having a lot of bacteria causes stool to be considered acidic, which  is a sign of lactose intolerance. How is this treated? There is no treatment to improve your body's ability to produce lactase. However, you can manage your symptoms at home by:  Limiting or avoiding dairy milk, dairy products, and other sources of lactose.  Taking lactase tablets when you eat milk products. Lactase tablets are over-the-counter medicines that help to improve lactose digestion. You may also add lactase drops to regular milk.  Adjusting your diet, such as drinking lactose-free milk. Lactose tolerance varies from person to person. Some people may be able to eat or drink small amounts of products that contain lactose, and other people may need to avoid all foods and drinks that contain lactose. Talk with your health care provider about what treatment is best for you. Follow these instructions at home:  Limit or avoid foods, beverages, and medicines that contain lactose, as told by your health care provider. Keep track of which foods, beverages, or medicines cause symptoms so you can decide what to avoid in the future.  Read food and medicine labels carefully. Avoid products that contain: ? Lactose. ? Milk solids. ? Casein. ? Whey.  Take over-the-counter and prescription medicines (including lactase tablets) only as told by your health care provider.  If you stop eating and drinking dairy products (eliminate dairy from your diet), make sure to get enough protein, calcium, and vitamin D from other foods. Work with your health care provider or a diet and nutrition specialist (dietitian) to make sure you get enough of those nutrients.  Choose a milk substitute that is fortified with calcium and vitamin D. ? Soy milk  contains high-quality protein. ? Milks that are made from nuts or grains (such as almond milk and rice milk) contain very small amounts of protein.  Keep all follow-up visits as told by your health care provider. This is important. Contact a health care provider  if:  You have no relief from your symptoms after you have eliminated milk products and other sources of lactose. Get help right away if:  You have blood in your stool.  You have severe abdomen (abdominal) pain. Summary  Lactose is a natural sugar that is found in dairy milk and dairy products such as cheese and yogurt. Lactose is digested by lactase, which is a protein (enzyme) in the small intestine.  Some people do not produce enough lactase to digest lactose. This is called lactose intolerance.  Lactose intolerance can cause discomfort within 30 minutes to 2 hours after you eat or drink something that contains lactose.  Limit or avoid foods, beverages, and medicines that contain lactose, as told by your health care provider. This information is not intended to replace advice given to you by your health care provider. Make sure you discuss any questions you have with your health care provider. Document Revised: 02/20/2017 Document Reviewed: 10/24/2016 Elsevier Patient Education  [place].

## 2019-11-10 NOTE — Progress Notes (Signed)
Subjective:  Patient ID: Jeffrey [name], male    DOB: Nov 30, 1965  Age: 54 y.o. MRN: [id number]  CC: No chief complaint on file.   HPI Jeffrey [name] presents for anxiety, depression C/o abd cramps 3 am - it wakes him up, he would have 2-3 small BMs in 40-45 min. It started a couple months ago. C/o occ BRBPR - seldom.  Outpatient Medications Prior to Visit  Medication Sig Dispense Refill  . atenolol (TENORMIN) 50 MG tablet TAKE 1 TABLET BY MOUTH DAILY 30 tablet 11  . Cholecalciferol (VITAMIN D3) 2000 UNITS capsule Take 1 capsule (2,000 Units total) by mouth daily. 100 capsule 3  . citalopram (CELEXA) 40 MG tablet Take 1 tablet (40 mg total) by mouth daily. 30 tablet 5  . clonazePAM (KLONOPIN) 1 MG tablet Take 1 tablet (1 mg total) by mouth 2 (two) times daily. 60 tablet 2  . ibuprofen (ADVIL,MOTRIN) 200 MG tablet Take 200-400 mg by mouth every 6 (six) hours as needed for headache or moderate pain.     . Methadone HCl (DOLOPHINE PO) Take 130 mg by mouth daily.     No facility-administered medications prior to visit.    ROS: Review of Systems  Constitutional: Positive for unexpected weight change. Negative for appetite change and fatigue.  HENT: Negative for congestion, nosebleeds, sneezing, sore throat and trouble swallowing.   Eyes: Negative for itching and visual disturbance.  Respiratory: Negative for cough.   Cardiovascular: Negative for chest pain, palpitations and leg swelling.  Gastrointestinal: Positive for abdominal pain and blood in stool. Negative for abdominal distention, diarrhea, nausea and rectal pain.  Genitourinary: Negative for frequency and hematuria.  Musculoskeletal: Negative for back pain, gait problem, joint swelling and neck pain.  Skin: Negative for rash.  Neurological: Negative for dizziness, tremors, speech difficulty and weakness.  Psychiatric/Behavioral: Negative for agitation, dysphoric mood and sleep disturbance. The patient is not  nervous/anxious.     Objective:  BP (!) 142/86 Comment: forgot to take meds this morning  Pulse 76   Temp 98.5 F (36.9 C) (Oral)   Ht 6\' 1"  (1.854 m)   Wt 271 lb (122.9 kg)   SpO2 96%   BMI 35.75 kg/m   BP Readings from Last 3 Encounters:  11/10/19 (!) 142/86  08/10/19 140/82  02/21/19 (!) 144/86    Wt Readings from Last 3 Encounters:  11/10/19 271 lb (122.9 kg)  08/10/19 265 lb (120.2 kg)  02/21/19 252 lb (114.3 kg)    Physical Exam Constitutional:      General: He is not in acute distress.    Appearance: He is well-developed. He is obese.     Comments: NAD  Eyes:     Conjunctiva/sclera: Conjunctivae normal.     Pupils: Pupils are equal, round, and reactive to light.  Neck:     Thyroid: No thyromegaly.     Vascular: No JVD.  Cardiovascular:     Rate and Rhythm: Normal rate and regular rhythm.     Heart sounds: Normal heart sounds. No murmur heard.  No friction rub. No gallop.   Pulmonary:     Effort: Pulmonary effort is normal. No respiratory distress.     Breath sounds: Normal breath sounds. No wheezing or rales.  Chest:     Chest wall: No tenderness.  Abdominal:     General: Bowel sounds are normal. There is no distension.     Palpations: Abdomen is soft. There is no mass.     Tenderness:  There is no abdominal tenderness. There is no guarding or rebound.  Musculoskeletal:        General: No tenderness. Normal range of motion.     Cervical back: Normal range of motion.  Lymphadenopathy:     Cervical: No cervical adenopathy.  Skin:    General: Skin is warm and dry.     Findings: No rash.  Neurological:     Mental Status: He is alert and oriented to person, place, and time.     Cranial Nerves: No cranial nerve deficit.     Motor: No abnormal muscle tone.     Coordination: Coordination normal.     Gait: Gait normal.     Deep Tendon Reflexes: Reflexes are normal and symmetric.  Psychiatric:        Behavior: Behavior normal.        Thought Content:  Thought content normal.        Judgment: Judgment normal.   rectal - def to GI  Lab Results  Component Value Date   WBC 8.1 08/05/2017   HGB 14.0 08/05/2017   HCT 41.3 08/05/2017   PLT 218 08/05/2017   GLUCOSE 94 08/05/2017   CHOL 168 09/28/2009   TRIG 218 (H) 09/28/2009   HDL 32 (L) 09/28/2009   LDLCALC 92 09/28/2009   ALT 24 09/28/2009   AST 27 09/28/2009   NA 141 08/05/2017   K 4.5 08/05/2017   CL 101 08/05/2017   CREATININE 1.37 (H) 08/05/2017   BUN 12 08/05/2017   CO2 26 08/05/2017   TSH 2.65 07/17/2016   PSA 0.19 06/16/2008   INR 1.0 08/05/2017   HGBA1C  12/13/2008    5.8 (NOTE) The ADA recommends the following therapeutic goal for glycemic control related to Hgb A1c measurement: Goal of therapy: <6.5 Hgb A1c  Reference: American Diabetes Association: Clinical Practice Recommendations 2010, Diabetes Care, 2010, 33: (Suppl  1).   MICROALBUR 1.03 06/06/2009    No results found.  Assessment & Plan:    Walker Kehr, MD

## 2019-11-10 NOTE — Assessment & Plan Note (Signed)
worse

## 2019-11-10 NOTE — Assessment & Plan Note (Signed)
C/o abd cramps 3 am - it wakes him up, he would have 2-3 small BMs in 40-45 min. It started a couple months ago. C/o occ BRBPR - seldom. Lactose free diet GI ref Hyosciamine Abd X ray

## 2020-02-14 ENCOUNTER — Other Ambulatory Visit: Payer: Self-pay

## 2020-02-14 ENCOUNTER — Encounter: Payer: Self-pay | Admitting: Internal Medicine

## 2020-02-14 ENCOUNTER — Ambulatory Visit (INDEPENDENT_AMBULATORY_CARE_PROVIDER_SITE_OTHER): Payer: Self-pay | Admitting: Internal Medicine

## 2020-02-14 DIAGNOSIS — F4310 Post-traumatic stress disorder, unspecified: Secondary | ICD-10-CM

## 2020-02-14 DIAGNOSIS — F1121 Opioid dependence, in remission: Secondary | ICD-10-CM

## 2020-02-14 DIAGNOSIS — I10 Essential (primary) hypertension: Secondary | ICD-10-CM | POA: Insufficient documentation

## 2020-02-14 DIAGNOSIS — R030 Elevated blood-pressure reading, without diagnosis of hypertension: Secondary | ICD-10-CM

## 2020-02-14 MED ORDER — CLONAZEPAM 1 MG PO TABS
1.0000 mg | ORAL_TABLET | Freq: Two times a day (BID) | ORAL | 2 refills | Status: DC
Start: 2020-02-14 — End: 2020-05-11

## 2020-02-14 NOTE — Patient Instructions (Addendum)

## 2020-02-14 NOTE — Assessment & Plan Note (Signed)
NAS diet Loose wt Consider Rx if not better

## 2020-02-14 NOTE — Assessment & Plan Note (Signed)
On Klonopin, Celexa  Potential benefits of a long term benzodiazepines  use as well as potential risks  and complications were explained to the patient and were aknowledged. [facility]. 

## 2020-02-14 NOTE — Assessment & Plan Note (Addendum)
On Methadone  Drug screens [facility] monthly

## 2020-02-14 NOTE — Progress Notes (Signed)
Subjective:  Patient ID: Jeffrey [name], male    DOB: 12-01-1965  Age: 54 y.o. MRN: [id number]  CC: Follow-up (3 month f/u)   HPI Jeffrey [name] presents for PTSD F/u elevated BP  Outpatient Medications Prior to Visit  Medication Sig Dispense Refill  . atenolol (TENORMIN) 50 MG tablet Take 1 tablet (50 mg total) by mouth daily. 30 tablet 11  . Cholecalciferol (VITAMIN D3) 2000 UNITS capsule Take 1 capsule (2,000 Units total) by mouth daily. 100 capsule 3  . citalopram (CELEXA) 40 MG tablet Take 1 tablet (40 mg total) by mouth daily. 30 tablet 5  . clonazePAM (KLONOPIN) 1 MG tablet Take 1 tablet (1 mg total) by mouth 2 (two) times daily. 60 tablet 2  . ibuprofen (ADVIL,MOTRIN) 200 MG tablet Take 200-400 mg by mouth every 6 (six) hours as needed for headache or moderate pain.     . Methadone HCl (DOLOPHINE PO) Take 130 mg by mouth daily.    . hyoscyamine (LEVSIN) 0.125 MG tablet Take 1 tablet (0.125 mg total) by mouth every 4 (four) hours as needed for up to 10 days for cramping. 100 tablet 0   No facility-administered medications prior to visit.    ROS: Review of Systems  Constitutional: Positive for unexpected weight change. Negative for appetite change and fatigue.  HENT: Negative for congestion, nosebleeds, sneezing, sore throat and trouble swallowing.   Eyes: Negative for itching and visual disturbance.  Respiratory: Negative for cough.   Cardiovascular: Negative for chest pain, palpitations and leg swelling.  Gastrointestinal: Negative for abdominal distention, blood in stool, diarrhea and nausea.  Genitourinary: Negative for frequency and hematuria.  Musculoskeletal: Negative for back pain, gait problem, joint swelling and neck pain.  Skin: Negative for rash.  Neurological: Negative for dizziness, tremors, speech difficulty and weakness.  Psychiatric/Behavioral: Negative for agitation, dysphoric mood, sleep disturbance and suicidal ideas. The patient is  nervous/anxious.     Objective:  BP (!) 148/90 (BP Location: Left Arm)   Pulse 67   Temp 98.5 F (36.9 C) (Oral)   Wt 281 lb 9.6 oz (127.7 kg)   SpO2 96%   BMI 37.15 kg/m   BP Readings from Last 3 Encounters:  02/14/20 (!) 148/90  11/10/19 (!) 142/86  08/10/19 140/82    Wt Readings from Last 3 Encounters:  02/14/20 281 lb 9.6 oz (127.7 kg)  11/10/19 271 lb (122.9 kg)  08/10/19 265 lb (120.2 kg)    Physical Exam Constitutional:      General: He is not in acute distress.    Appearance: He is well-developed. He is obese.     Comments: NAD  Eyes:     Conjunctiva/sclera: Conjunctivae normal.     Pupils: Pupils are equal, round, and reactive to light.  Neck:     Thyroid: No thyromegaly.     Vascular: No JVD.  Cardiovascular:     Rate and Rhythm: Normal rate and regular rhythm.     Heart sounds: Normal heart sounds. No murmur heard.  No friction rub. No gallop.   Pulmonary:     Effort: Pulmonary effort is normal. No respiratory distress.     Breath sounds: Normal breath sounds. No wheezing or rales.  Chest:     Chest wall: No tenderness.  Abdominal:     General: Bowel sounds are normal. There is no distension.     Palpations: Abdomen is soft. There is no mass.     Tenderness: There is no abdominal tenderness. There  is no guarding or rebound.  Musculoskeletal:        General: No tenderness. Normal range of motion.     Cervical back: Normal range of motion.  Lymphadenopathy:     Cervical: No cervical adenopathy.  Skin:    General: Skin is warm and dry.     Findings: No rash.  Neurological:     Mental Status: He is alert and oriented to person, place, and time.     Cranial Nerves: No cranial nerve deficit.     Motor: No abnormal muscle tone.     Coordination: Coordination normal.     Gait: Gait normal.     Deep Tendon Reflexes: Reflexes are normal and symmetric.  Psychiatric:        Behavior: Behavior normal.        Thought Content: Thought content normal.          Judgment: Judgment normal.     Lab Results  Component Value Date   WBC 8.1 08/05/2017   HGB 14.0 08/05/2017   HCT 41.3 08/05/2017   PLT 218 08/05/2017   GLUCOSE 94 08/05/2017   CHOL 168 09/28/2009   TRIG 218 (H) 09/28/2009   HDL 32 (L) 09/28/2009   LDLCALC 92 09/28/2009   ALT 24 09/28/2009   AST 27 09/28/2009   NA 141 08/05/2017   K 4.5 08/05/2017   CL 101 08/05/2017   CREATININE 1.37 (H) 08/05/2017   BUN 12 08/05/2017   CO2 26 08/05/2017   TSH 2.65 07/17/2016   PSA 0.19 06/16/2008   INR 1.0 08/05/2017   HGBA1C  12/13/2008    5.8 (NOTE) The ADA recommends the following therapeutic goal for glycemic control related to Hgb A1c measurement: Goal of therapy: <6.5 Hgb A1c  Reference: American Diabetes Association: Clinical Practice Recommendations 2010, Diabetes Care, 2010, 33: (Suppl  1).   MICROALBUR 1.03 06/06/2009    No results found.  Assessment & Plan:   There are no diagnoses linked to this encounter.   No orders of the defined types were placed in this encounter.    Follow-up: No follow-ups on file.  Walker Kehr, MD

## 2020-02-22 ENCOUNTER — Encounter: Payer: Self-pay | Admitting: Internal Medicine

## 2020-05-08 ENCOUNTER — Telehealth: Payer: Self-pay | Admitting: Internal Medicine

## 2020-05-08 NOTE — Telephone Encounter (Signed)
[facility] calling, states they faxed over a care coordination letter on 02.01.22. They are faxing it again today to make sure we have received it.  [place]

## 2020-05-09 NOTE — Telephone Encounter (Signed)
Form has been faxed back to [place] [facility].Marland KitchenJohny Chess

## 2020-05-09 NOTE — Telephone Encounter (Signed)
Done. Thanks.

## 2020-05-10 ENCOUNTER — Other Ambulatory Visit: Payer: Self-pay | Admitting: Internal Medicine

## 2020-05-16 ENCOUNTER — Other Ambulatory Visit: Payer: Self-pay

## 2020-05-17 ENCOUNTER — Ambulatory Visit (INDEPENDENT_AMBULATORY_CARE_PROVIDER_SITE_OTHER): Payer: Self-pay | Admitting: Internal Medicine

## 2020-05-17 ENCOUNTER — Encounter: Payer: Self-pay | Admitting: Internal Medicine

## 2020-05-17 DIAGNOSIS — F4310 Post-traumatic stress disorder, unspecified: Secondary | ICD-10-CM

## 2020-05-17 DIAGNOSIS — I4581 Long QT syndrome: Secondary | ICD-10-CM

## 2020-05-17 MED ORDER — CITALOPRAM HYDROBROMIDE 40 MG PO TABS
40.0000 mg | ORAL_TABLET | Freq: Every day | ORAL | 5 refills | Status: DC
Start: 2020-05-17 — End: 2021-05-10

## 2020-05-17 NOTE — Assessment & Plan Note (Signed)
On Klonopin, Celexa  Potential benefits of a long term benzodiazepines  use as well as potential risks  and complications were explained to the patient and were aknowledged. Drug screens at [facility].

## 2020-05-17 NOTE — Progress Notes (Signed)
Subjective:  Patient ID: Jeffrey [name], male    DOB: 01-Sep-1965  Age: 55 y.o. MRN: [id number]  CC: Follow-up (3 month f/u)   HPI Paxson Harrower Stiff presents for anxiety. He lost wt on diet  Outpatient Medications Prior to Visit  Medication Sig Dispense Refill  . atenolol (TENORMIN) 50 MG tablet Take 1 tablet (50 mg total) by mouth daily. 30 tablet 11  . Cholecalciferol (VITAMIN D3) 2000 UNITS capsule Take 1 capsule (2,000 Units total) by mouth daily. 100 capsule 3  . citalopram (CELEXA) 40 MG tablet Take 1 tablet (40 mg total) by mouth daily. 30 tablet 5  . clonazePAM (KLONOPIN) 1 MG tablet TAKE 1 TABLET BY MOUTH TWICE A DAY 60 tablet 3  . ibuprofen (ADVIL,MOTRIN) 200 MG tablet Take 200-400 mg by mouth every 6 (six) hours as needed for headache or moderate pain.     . Methadone HCl (DOLOPHINE PO) Take 130 mg by mouth daily.    . hyoscyamine (LEVSIN) 0.125 MG tablet Take 1 tablet (0.125 mg total) by mouth every 4 (four) hours as needed for up to 10 days for cramping. 100 tablet 0   No facility-administered medications prior to visit.    ROS: Review of Systems  Constitutional: Negative for appetite change, fatigue and unexpected weight change.  HENT: Negative for congestion, nosebleeds, sneezing, sore throat and trouble swallowing.   Eyes: Negative for itching and visual disturbance.  Respiratory: Negative for cough.   Cardiovascular: Negative for chest pain, palpitations and leg swelling.  Gastrointestinal: Negative for abdominal distention, blood in stool, diarrhea and nausea.  Genitourinary: Negative for frequency and hematuria.  Musculoskeletal: Negative for back pain, gait problem, joint swelling and neck pain.  Skin: Negative for rash.  Neurological: Negative for dizziness, tremors, speech difficulty and weakness.  Psychiatric/Behavioral: Negative for agitation, dysphoric mood, sleep disturbance and suicidal ideas. The patient is nervous/anxious.     Objective:  BP  134/82 (BP Location: Left Arm)   Pulse 62   Temp 98.6 F (37 C) (Oral)   Ht 6\' 1"  (1.854 m)   Wt 269 lb 6.4 oz (122.2 kg)   SpO2 96%   BMI 35.54 kg/m   BP Readings from Last 3 Encounters:  05/17/20 134/82  02/14/20 (!) 148/90  11/10/19 (!) 142/86    Wt Readings from Last 3 Encounters:  05/17/20 269 lb 6.4 oz (122.2 kg)  02/14/20 281 lb 9.6 oz (127.7 kg)  11/10/19 271 lb (122.9 kg)    Physical Exam Constitutional:      General: He is not in acute distress.    Appearance: He is well-developed. He is obese.     Comments: NAD  HENT:     Mouth/Throat:     Mouth: Oropharynx is clear and moist.  Eyes:     Conjunctiva/sclera: Conjunctivae normal.     Pupils: Pupils are equal, round, and reactive to light.  Neck:     Thyroid: No thyromegaly.     Vascular: No JVD.  Cardiovascular:     Rate and Rhythm: Normal rate and regular rhythm.     Pulses: Intact distal pulses.     Heart sounds: Normal heart sounds. No murmur heard. No friction rub. No gallop.   Pulmonary:     Effort: Pulmonary effort is normal. No respiratory distress.     Breath sounds: Normal breath sounds. No wheezing or rales.  Chest:     Chest wall: No tenderness.  Abdominal:     General: Bowel sounds are  normal. There is no distension.     Palpations: Abdomen is soft. There is no mass.     Tenderness: There is no abdominal tenderness. There is no guarding or rebound.  Musculoskeletal:        General: No tenderness or edema. Normal range of motion.     Cervical back: Normal range of motion.  Lymphadenopathy:     Cervical: No cervical adenopathy.  Skin:    General: Skin is warm and dry.     Findings: No rash.  Neurological:     Mental Status: He is alert and oriented to person, place, and time.     Cranial Nerves: No cranial nerve deficit.     Motor: No abnormal muscle tone.     Coordination: He displays a negative Romberg sign. Coordination normal.     Gait: Gait normal.     Deep Tendon Reflexes:  Reflexes are normal and symmetric.  Psychiatric:        Mood and Affect: Mood and affect normal.        Behavior: Behavior normal.        Thought Content: Thought content normal.        Judgment: Judgment normal.     Lab Results  Component Value Date   WBC 8.1 08/05/2017   HGB 14.0 08/05/2017   HCT 41.3 08/05/2017   PLT 218 08/05/2017   GLUCOSE 94 08/05/2017   CHOL 168 09/28/2009   TRIG 218 (H) 09/28/2009   HDL 32 (L) 09/28/2009   LDLCALC 92 09/28/2009   ALT 24 09/28/2009   AST 27 09/28/2009   NA 141 08/05/2017   K 4.5 08/05/2017   CL 101 08/05/2017   CREATININE 1.37 (H) 08/05/2017   BUN 12 08/05/2017   CO2 26 08/05/2017   TSH 2.65 07/17/2016   PSA 0.19 06/16/2008   INR 1.0 08/05/2017   HGBA1C  12/13/2008    5.8 (NOTE) The ADA recommends the following therapeutic goal for glycemic control related to Hgb A1c measurement: Goal of therapy: <6.5 Hgb A1c  Reference: American Diabetes Association: Clinical Practice Recommendations 2010, Diabetes Care, 2010, 33: (Suppl  1).   MICROALBUR 1.03 06/06/2009    No results found.  Assessment & Plan:    Walker Kehr, MD

## 2020-05-17 NOTE — Assessment & Plan Note (Signed)
Resolved

## 2020-08-13 ENCOUNTER — Other Ambulatory Visit: Payer: Self-pay

## 2020-08-14 ENCOUNTER — Ambulatory Visit: Payer: Self-pay | Admitting: Internal Medicine

## 2020-08-21 ENCOUNTER — Other Ambulatory Visit: Payer: Self-pay

## 2020-08-21 ENCOUNTER — Ambulatory Visit (INDEPENDENT_AMBULATORY_CARE_PROVIDER_SITE_OTHER): Payer: Self-pay | Admitting: Internal Medicine

## 2020-08-21 ENCOUNTER — Encounter: Payer: Self-pay | Admitting: Internal Medicine

## 2020-08-21 DIAGNOSIS — E669 Obesity, unspecified: Secondary | ICD-10-CM

## 2020-08-21 DIAGNOSIS — F4310 Post-traumatic stress disorder, unspecified: Secondary | ICD-10-CM

## 2020-08-21 DIAGNOSIS — Z950 Presence of cardiac pacemaker: Secondary | ICD-10-CM

## 2020-08-21 MED ORDER — CLONAZEPAM 1 MG PO TABS
1.0000 mg | ORAL_TABLET | Freq: Two times a day (BID) | ORAL | 3 refills | Status: DC
Start: 2020-08-21 — End: 2020-11-12

## 2020-08-21 NOTE — Assessment & Plan Note (Signed)
Better - loosing wt

## 2020-08-21 NOTE — Assessment & Plan Note (Signed)
Cont w/ Klonopin, Celexa  Potential benefits of a long term benzodiazepines  use as well as potential risks  and complications were explained to the patient and were aknowledged. [facility].

## 2020-08-21 NOTE — Assessment & Plan Note (Signed)
Home checks

## 2020-08-21 NOTE — Progress Notes (Signed)
Subjective:  Patient ID: AUREL NGUYEN, male    DOB: 1965-05-25  Age: 55 y.o. MRN: [id number]  CC: Follow-up (3 month f/u)   HPI Rusty Villella Brisbane presents for anxiety, depression f/u  Outpatient Medications Prior to Visit  Medication Sig Dispense Refill  . atenolol (TENORMIN) 50 MG tablet Take 1 tablet (50 mg total) by mouth daily. 30 tablet 11  . Cholecalciferol (VITAMIN D3) 2000 UNITS capsule Take 1 capsule (2,000 Units total) by mouth daily. 100 capsule 3  . citalopram (CELEXA) 40 MG tablet Take 1 tablet (40 mg total) by mouth daily. 30 tablet 5  . clonazePAM (KLONOPIN) 1 MG tablet TAKE 1 TABLET BY MOUTH TWICE A DAY 60 tablet 3  . ibuprofen (ADVIL,MOTRIN) 200 MG tablet Take 200-400 mg by mouth every 6 (six) hours as needed for headache or moderate pain.     . Methadone HCl (DOLOPHINE PO) Take 130 mg by mouth daily.    . hyoscyamine (LEVSIN) 0.125 MG tablet Take 1 tablet (0.125 mg total) by mouth every 4 (four) hours as needed for up to 10 days for cramping. 100 tablet 0   No facility-administered medications prior to visit.    ROS: Review of Systems  Constitutional: Negative for appetite change, fatigue and unexpected weight change.  HENT: Negative for congestion, nosebleeds, sneezing, sore throat and trouble swallowing.   Eyes: Negative for itching and visual disturbance.  Respiratory: Negative for cough.   Cardiovascular: Negative for chest pain, palpitations and leg swelling.  Gastrointestinal: Negative for abdominal distention, blood in stool, diarrhea and nausea.  Genitourinary: Negative for frequency and hematuria.  Musculoskeletal: Negative for back pain, gait problem, joint swelling and neck pain.  Skin: Negative for rash.  Neurological: Negative for dizziness, tremors, speech difficulty and weakness.  Psychiatric/Behavioral: Negative for agitation, dysphoric mood, sleep disturbance and suicidal ideas. The patient is not nervous/anxious.     Objective:  BP (!)  142/84 (BP Location: Left Arm)   Pulse 72   Temp 98.7 F (37.1 C) (Oral)   Ht 6\' 1"  (1.854 m)   Wt 266 lb 9.6 oz (120.9 kg)   SpO2 96%   BMI 35.17 kg/m   BP Readings from Last 3 Encounters:  08/21/20 (!) 142/84  05/17/20 134/82  02/14/20 (!) 148/90    Wt Readings from Last 3 Encounters:  08/21/20 266 lb 9.6 oz (120.9 kg)  05/17/20 269 lb 6.4 oz (122.2 kg)  02/14/20 281 lb 9.6 oz (127.7 kg)    Physical Exam Constitutional:      Appearance: He is obese.  Neurological:     Mental Status: He is oriented to person, place, and time.     Coordination: Coordination normal.  Psychiatric:        Behavior: Behavior normal.     Lab Results  Component Value Date   WBC 8.1 08/05/2017   HGB 14.0 08/05/2017   HCT 41.3 08/05/2017   PLT 218 08/05/2017   GLUCOSE 94 08/05/2017   CHOL 168 09/28/2009   TRIG 218 (H) 09/28/2009   HDL 32 (L) 09/28/2009   LDLCALC 92 09/28/2009   ALT 24 09/28/2009   AST 27 09/28/2009   NA 141 08/05/2017   K 4.5 08/05/2017   CL 101 08/05/2017   CREATININE 1.37 (H) 08/05/2017   BUN 12 08/05/2017   CO2 26 08/05/2017   TSH 2.65 07/17/2016   PSA 0.19 06/16/2008   INR 1.0 08/05/2017   HGBA1C  12/13/2008    5.8 (NOTE) The ADA  recommends the following therapeutic goal for glycemic control related to Hgb A1c measurement: Goal of therapy: <6.5 Hgb A1c  Reference: American Diabetes Association: Clinical Practice Recommendations 2010, Diabetes Care, 2010, 33: (Suppl  1).   MICROALBUR 1.03 06/06/2009    No results found.  Assessment & Plan:    Walker Kehr, MD

## 2020-08-31 ENCOUNTER — Telehealth (INDEPENDENT_AMBULATORY_CARE_PROVIDER_SITE_OTHER): Payer: Self-pay | Admitting: Internal Medicine

## 2020-08-31 DIAGNOSIS — J329 Chronic sinusitis, unspecified: Secondary | ICD-10-CM

## 2020-08-31 MED ORDER — AZITHROMYCIN 250 MG PO TABS: ORAL_TABLET | ORAL | 0 refills | Status: AC

## 2020-08-31 NOTE — Progress Notes (Signed)
Patient ID: YSIDRO RAMSAY, male   DOB: 1965-07-11, 55 y.o.   MRN: [id number]  Virtual Visit via Video Note  I connected with Lilia Argue [place] on 08/31/20 at  9:00 AM EDT by a video enabled telemedicine application and verified that I am speaking with the correct person using two identifiers.  Location of all participants today Patient: at home Provider: at office   I discussed the limitations of evaluation and management by telemedicine and the availability of in person appointments. The patient expressed understanding and agreed to proceed.  History of Present Illness:  Here with 2-3 days acute onset fever, facial pain, pressure, headache, general weakness and malaise, and greenish d/c, with mild ST and cough, but pt denies chest pain, wheezing, increased sob or doe, orthopnea, PND, increased LE swelling, palpitations, dizziness or syncope. Also wth noticed several bumps in the mouth Past Medical History:  Diagnosis Date   Acute sinusitis 10/29/2016   8/18   Anxiety    Cerumen impaction 10/29/2016   2018   Long Q-T syndrome 12/19/2014   Pacemaker [place] low dose - no issues w/QT    Muscular fasciculation 03/09/2015   Face, lips, hands - chronic   Opioid dependence ([place]) 12/19/2014   x20 years On Methadone since 1993 - Methadone clinic    Pacemaker    2006 [employer], Identity XL [address] 2831, serial # [id number]. [employer] rep must physically interrogate pacemaker.    PTSD (post-traumatic stress disorder) 12/19/2014   On Klonopin, Celexa  Potential benefits of a long term benzodiazepines  use as well as potential risks  and complications were explained to the patient and were aknowledged.    Skin abscess 03/09/2015   11/16 - small    Substance abuse ([place])    opioids   Tremor 07/17/2016   ?etiology    Past Surgical History:  Procedure Laterality Date   HERNIA REPAIR Right    LIVER SURGERY     PPM GENERATOR CHANGEOUT N/A 08/14/2017   Procedure: PPM GENERATOR CHANGEOUT;  Surgeon:  Deboraha Sprang, MD;  Location: [facility];  Service: Cardiovascular;  Laterality: N/A;    reports that he has never smoked. His smokeless tobacco use includes chew. He reports that he does not drink alcohol and does not use drugs. family history includes Arthritis in his father and mother; Depression in his mother; Diabetes in his mother. Allergies  Allergen Reactions   Codeine Itching   Penicillins Hives    Has patient had a PCN reaction causing immediate rash, facial/tongue/throat swelling, SOB or lightheadedness with hypotension: unknown Has patient had a PCN reaction causing severe rash involving mucus membranes or skin necrosis: unknown Has patient had a PCN reaction that required hospitalization unknown Has patient had a PCN reaction occurring within the last 10 years: No If all of the above answers are "NO", then may proceed with Cephalosporin use.    Current Outpatient Medications on File Prior to Visit  Medication Sig Dispense Refill   atenolol (TENORMIN) 50 MG tablet Take 1 tablet (50 mg total) by mouth daily. 30 tablet 11   Cholecalciferol (VITAMIN D3) 2000 UNITS capsule Take 1 capsule (2,000 Units total) by mouth daily. 100 capsule 3   citalopram (CELEXA) 40 MG tablet Take 1 tablet (40 mg total) by mouth daily. 30 tablet 5   clonazePAM (KLONOPIN) 1 MG tablet Take 1 tablet (1 mg total) by mouth 2 (two) times daily. 60 tablet 3   hyoscyamine (LEVSIN) 0.125  MG tablet Take 1 tablet (0.125 mg total) by mouth every 4 (four) hours as needed for up to 10 days for cramping. 100 tablet 0   ibuprofen (ADVIL,MOTRIN) 200 MG tablet Take 200-400 mg by mouth every 6 (six) hours as needed for headache or moderate pain.      Methadone HCl (DOLOPHINE PO) Take 130 mg by mouth daily.     No current facility-administered medications on file prior to visit.    Observations/Objective: Alert, NAD, appropriate mood and affect, resps normal, cn 2-12 intact, moves all 4s, no visible rash or  swelling Lab Results  Component Value Date   WBC 8.1 08/05/2017   HGB 14.0 08/05/2017   HCT 41.3 08/05/2017   PLT 218 08/05/2017   GLUCOSE 94 08/05/2017   CHOL 168 09/28/2009   TRIG 218 (H) 09/28/2009   HDL 32 (L) 09/28/2009   LDLCALC 92 09/28/2009   ALT 24 09/28/2009   AST 27 09/28/2009   NA 141 08/05/2017   K 4.5 08/05/2017   CL 101 08/05/2017   CREATININE 1.37 (H) 08/05/2017   BUN 12 08/05/2017   CO2 26 08/05/2017   TSH 2.65 07/17/2016   PSA 0.19 06/16/2008   INR 1.0 08/05/2017   HGBA1C  12/13/2008    5.8 (NOTE) The ADA recommends the following therapeutic goal for glycemic control related to Hgb A1c measurement: Goal of therapy: <6.5 Hgb A1c  Reference: American Diabetes Association: Clinical Practice Recommendations 2010, Diabetes Care, 2010, 33: (Suppl  1).   MICROALBUR 1.03 06/06/2009     Assessment and Plan: See notes  Follow Up Instructions: See notes   I discussed the assessment and treatment plan with the patient. The patient was provided an opportunity to ask questions and all were answered. The patient agreed with the plan and demonstrated an understanding of the instructions.   The patient was advised to call back or seek an in-person evaluation if the symptoms worsen or if the condition fails to improve as anticipated.   Cathlean Cower, MD

## 2020-09-02 ENCOUNTER — Encounter: Payer: Self-pay | Admitting: Internal Medicine

## 2020-09-02 DIAGNOSIS — J329 Chronic sinusitis, unspecified: Secondary | ICD-10-CM | POA: Insufficient documentation

## 2020-09-02 NOTE — Patient Instructions (Signed)
Please take all new medication as prescribed 

## 2020-09-02 NOTE — Assessment & Plan Note (Addendum)
Mild to mod, for antibx course,  to f/u any worsening symptoms or concerns 

## 2020-11-12 ENCOUNTER — Other Ambulatory Visit: Payer: Self-pay

## 2020-11-12 ENCOUNTER — Encounter: Payer: Self-pay | Admitting: Internal Medicine

## 2020-11-12 ENCOUNTER — Ambulatory Visit (INDEPENDENT_AMBULATORY_CARE_PROVIDER_SITE_OTHER): Payer: Self-pay | Admitting: Internal Medicine

## 2020-11-12 ENCOUNTER — Other Ambulatory Visit: Payer: Self-pay | Admitting: Internal Medicine

## 2020-11-12 DIAGNOSIS — E669 Obesity, unspecified: Secondary | ICD-10-CM

## 2020-11-12 DIAGNOSIS — F4310 Post-traumatic stress disorder, unspecified: Secondary | ICD-10-CM

## 2020-11-12 MED ORDER — CLONAZEPAM 1 MG PO TABS
1.0000 mg | ORAL_TABLET | Freq: Two times a day (BID) | ORAL | 3 refills | Status: DC
Start: 2020-11-12 — End: 2021-02-04

## 2020-11-12 NOTE — Assessment & Plan Note (Signed)
On Celexa 40 mg daily.  Continue with clonazepam 1 mg twice daily as needed  Potential benefits of a long term benzodiazepines  use as well as potential risks  and complications were explained to the patient and were aknowledged.

## 2020-11-12 NOTE — Assessment & Plan Note (Signed)
Chronic weight problem.  Diet discussed

## 2020-11-12 NOTE — Progress Notes (Signed)
Subjective:  Patient ID: Jeffrey [name], male    DOB: 1966-03-03  Age: 55 y.o. MRN: [id number]  CC: Follow-up (3 month f/u)   HPI Jeffrey [name] presents for anxiety, PTSD  Outpatient Medications Prior to Visit  Medication Sig Dispense Refill   atenolol (TENORMIN) 50 MG tablet Take 1 tablet (50 mg total) by mouth daily. 30 tablet 11   Cholecalciferol (VITAMIN D3) 2000 UNITS capsule Take 1 capsule (2,000 Units total) by mouth daily. 100 capsule 3   citalopram (CELEXA) 40 MG tablet Take 1 tablet (40 mg total) by mouth daily. 30 tablet 5   clonazePAM (KLONOPIN) 1 MG tablet Take 1 tablet (1 mg total) by mouth 2 (two) times daily. 60 tablet 3   ibuprofen (ADVIL,MOTRIN) 200 MG tablet Take 200-400 mg by mouth every 6 (six) hours as needed for headache or moderate pain.      Methadone HCl (DOLOPHINE PO) Take 130 mg by mouth daily.     hyoscyamine (LEVSIN) 0.125 MG tablet Take 1 tablet (0.125 mg total) by mouth every 4 (four) hours as needed for up to 10 days for cramping. 100 tablet 0   No facility-administered medications prior to visit.    ROS: Review of Systems  Constitutional:  Positive for unexpected weight change. Negative for appetite change and fatigue.  HENT:  Negative for congestion, nosebleeds, sneezing, sore throat and trouble swallowing.   Eyes:  Negative for itching and visual disturbance.  Respiratory:  Negative for cough.   Cardiovascular:  Negative for chest pain, palpitations and leg swelling.  Gastrointestinal:  Negative for abdominal distention, blood in stool, diarrhea and nausea.  Genitourinary:  Negative for frequency and hematuria.  Musculoskeletal:  Negative for back pain, gait problem, joint swelling and neck pain.  Skin:  Negative for rash.  Neurological:  Negative for dizziness, tremors, speech difficulty and weakness.  Psychiatric/Behavioral:  Negative for agitation, dysphoric mood, sleep disturbance and suicidal ideas. The patient is nervous/anxious.     Objective:  BP (!) 142/86 (BP Location: Left Arm)   Pulse 92   Temp 98.4 F (36.9 C) (Oral)   Ht '6\' 1"'$  (1.854 m)   Wt 268 lb 9.6 oz (121.8 kg)   SpO2 95%   BMI 35.44 kg/m   BP Readings from Last 3 Encounters:  11/12/20 (!) 142/86  08/21/20 (!) 142/84  05/17/20 134/82    Wt Readings from Last 3 Encounters:  11/12/20 268 lb 9.6 oz (121.8 kg)  08/21/20 266 lb 9.6 oz (120.9 kg)  05/17/20 269 lb 6.4 oz (122.2 kg)    Physical Exam Constitutional:      General: He is not in acute distress.    Appearance: He is well-developed. He is obese.     Comments: NAD  Eyes:     Conjunctiva/sclera: Conjunctivae normal.     Pupils: Pupils are equal, round, and reactive to light.  Neck:     Thyroid: No thyromegaly.     Vascular: No JVD.  Cardiovascular:     Rate and Rhythm: Normal rate and regular rhythm.     Heart sounds: Normal heart sounds. No murmur heard.   No friction rub. No gallop.  Pulmonary:     Effort: Pulmonary effort is normal. No respiratory distress.     Breath sounds: Normal breath sounds. No wheezing or rales.  Chest:     Chest wall: No tenderness.  Abdominal:     General: Bowel sounds are normal. There is no distension.  Palpations: Abdomen is soft. There is no mass.     Tenderness: There is no abdominal tenderness. There is no guarding or rebound.  Musculoskeletal:        General: No tenderness. Normal range of motion.     Cervical back: Normal range of motion.  Lymphadenopathy:     Cervical: No cervical adenopathy.  Skin:    General: Skin is warm and dry.     Findings: No rash.  Neurological:     Mental Status: He is alert and oriented to person, place, and time.     Cranial Nerves: No cranial nerve deficit.     Motor: No abnormal muscle tone.     Coordination: Coordination normal.     Gait: Gait normal.     Deep Tendon Reflexes: Reflexes are normal and symmetric.  Psychiatric:        Behavior: Behavior normal.        Thought Content: Thought  content normal.        Judgment: Judgment normal.    Lab Results  Component Value Date   WBC 8.1 08/05/2017   HGB 14.0 08/05/2017   HCT 41.3 08/05/2017   PLT 218 08/05/2017   GLUCOSE 94 08/05/2017   CHOL 168 09/28/2009   TRIG 218 (H) 09/28/2009   HDL 32 (L) 09/28/2009   LDLCALC 92 09/28/2009   ALT 24 09/28/2009   AST 27 09/28/2009   NA 141 08/05/2017   K 4.5 08/05/2017   CL 101 08/05/2017   CREATININE 1.37 (H) 08/05/2017   BUN 12 08/05/2017   CO2 26 08/05/2017   TSH 2.65 07/17/2016   PSA 0.19 06/16/2008   INR 1.0 08/05/2017   HGBA1C  12/13/2008    5.8 (NOTE) The ADA recommends the following therapeutic goal for glycemic control related to Hgb A1c measurement: Goal of therapy: <6.5 Hgb A1c  Reference: American Diabetes Association: Clinical Practice Recommendations 2010, Diabetes Care, 2010, 33: (Suppl  1).   MICROALBUR 1.03 06/06/2009    No results found.  Assessment & Plan:     Follow-up: No follow-ups on file.  Walker Kehr, MD

## 2020-11-16 ENCOUNTER — Telehealth: Payer: Self-pay | Admitting: *Deleted

## 2020-11-16 NOTE — Telephone Encounter (Signed)
Order faxed back to [facility].Marland KitchenJohny Chess

## 2021-02-04 ENCOUNTER — Other Ambulatory Visit: Payer: Self-pay

## 2021-02-04 ENCOUNTER — Ambulatory Visit (INDEPENDENT_AMBULATORY_CARE_PROVIDER_SITE_OTHER): Payer: Self-pay | Admitting: Internal Medicine

## 2021-02-04 ENCOUNTER — Encounter: Payer: Self-pay | Admitting: Internal Medicine

## 2021-02-04 DIAGNOSIS — D485 Neoplasm of uncertain behavior of skin: Secondary | ICD-10-CM | POA: Insufficient documentation

## 2021-02-04 DIAGNOSIS — F4310 Post-traumatic stress disorder, unspecified: Secondary | ICD-10-CM

## 2021-02-04 DIAGNOSIS — J329 Chronic sinusitis, unspecified: Secondary | ICD-10-CM

## 2021-02-04 MED ORDER — TRIAMCINOLONE ACETONIDE 0.1 % EX CREA: 1.0000 "application " | TOPICAL_CREAM | Freq: Three times a day (TID) | CUTANEOUS | 1 refills | Status: AC

## 2021-02-04 MED ORDER — CEFDINIR 300 MG PO CAPS: 300.0000 mg | ORAL_CAPSULE | Freq: Two times a day (BID) | ORAL | 0 refills | Status: DC

## 2021-02-04 MED ORDER — CLONAZEPAM 1 MG PO TABS
1.0000 mg | ORAL_TABLET | Freq: Two times a day (BID) | ORAL | 3 refills | Status: DC
Start: 2021-02-04 — End: 2021-05-07

## 2021-02-04 NOTE — Assessment & Plan Note (Signed)
Scab on nose 4x5 mm --  New Derm ref if not resolved. Rx: Triamc cream

## 2021-02-04 NOTE — Progress Notes (Signed)
Subjective:  Patient ID: Jeffrey [name], male    DOB: 02-18-1966  Age: 55 y.o. MRN: [id number]  CC: Follow-up (3 month f/u)   HPI Jeffrey [name] presents for anxiety f/u Jeffrey [name] had a pos COVID test 3 weeks and URI. C/o cough and nasal d/c, tired.    Outpatient Medications Prior to Visit  Medication Sig Dispense Refill   atenolol (TENORMIN) 50 MG tablet TAKE 1 TABLET (50 MG TOTAL) BY MOUTH DAILY. 30 tablet 11   Cholecalciferol (VITAMIN D3) 2000 UNITS capsule Take 1 capsule (2,000 Units total) by mouth daily. 100 capsule 3   citalopram (CELEXA) 40 MG tablet Take 1 tablet (40 mg total) by mouth daily. 30 tablet 5   ibuprofen (ADVIL,MOTRIN) 200 MG tablet Take 200-400 mg by mouth every 6 (six) hours as needed for headache or moderate pain.      Methadone HCl (DOLOPHINE PO) Take 130 mg by mouth daily.     clonazePAM (KLONOPIN) 1 MG tablet Take 1 tablet (1 mg total) by mouth 2 (two) times daily. 60 tablet 3   hyoscyamine (LEVSIN) 0.125 MG tablet Take 1 tablet (0.125 mg total) by mouth every 4 (four) hours as needed for up to 10 days for cramping. 100 tablet 0   No facility-administered medications prior to visit.    ROS: Review of Systems  Constitutional:  Positive for fatigue. Negative for appetite change and unexpected weight change.  HENT:  Positive for congestion, postnasal drip, rhinorrhea and sinus pressure. Negative for nosebleeds, sneezing, sore throat and trouble swallowing.   Eyes:  Negative for itching and visual disturbance.  Respiratory:  Positive for cough.   Cardiovascular:  Negative for chest pain, palpitations and leg swelling.  Gastrointestinal:  Negative for abdominal distention, blood in stool, diarrhea and nausea.  Genitourinary:  Negative for frequency and hematuria.  Musculoskeletal:  Negative for back pain, gait problem, joint swelling and neck pain.  Skin:  Negative for rash.  Neurological:  Negative for dizziness, tremors, speech difficulty and weakness.   Psychiatric/Behavioral:  Negative for agitation, dysphoric mood, sleep disturbance and suicidal ideas. The patient is nervous/anxious.    Objective:  BP 138/90 (BP Location: Left Arm)   Pulse 67   Temp 98.3 F (36.8 C) (Oral)   Ht 6\' 1"  (1.854 m)   Wt 261 lb 9.6 oz (118.7 kg)   SpO2 97%   BMI 34.51 kg/m   BP Readings from Last 3 Encounters:  02/04/21 138/90  11/12/20 (!) 142/86  08/21/20 (!) 142/84    Wt Readings from Last 3 Encounters:  02/04/21 261 lb 9.6 oz (118.7 kg)  11/12/20 268 lb 9.6 oz (121.8 kg)  08/21/20 266 lb 9.6 oz (120.9 kg)    Physical Exam Constitutional:      General: He is not in acute distress.    Appearance: He is well-developed. He is obese.     Comments: NAD  Eyes:     Conjunctiva/sclera: Conjunctivae normal.     Pupils: Pupils are equal, round, and reactive to light.  Neck:     Thyroid: No thyromegaly.     Vascular: No JVD.  Cardiovascular:     Rate and Rhythm: Normal rate and regular rhythm.     Heart sounds: Normal heart sounds. No murmur heard.   No friction rub. No gallop.  Pulmonary:     Effort: Pulmonary effort is normal. No respiratory distress.     Breath sounds: Normal breath sounds. No wheezing or rales.  Chest:  Chest wall: No tenderness.  Abdominal:     General: Bowel sounds are normal. There is no distension.     Palpations: Abdomen is soft. There is no mass.     Tenderness: There is no abdominal tenderness. There is no guarding or rebound.  Musculoskeletal:        General: No tenderness. Normal range of motion.     Cervical back: Normal range of motion.  Lymphadenopathy:     Cervical: No cervical adenopathy.  Skin:    General: Skin is warm and dry.     Findings: No rash.  Neurological:     Mental Status: He is alert and oriented to person, place, and time.     Cranial Nerves: No cranial nerve deficit.     Motor: No abnormal muscle tone.     Coordination: Coordination normal.     Gait: Gait normal.     Deep  Tendon Reflexes: Reflexes are normal and symmetric.  Psychiatric:        Behavior: Behavior normal.        Thought Content: Thought content normal.        Judgment: Judgment normal.  Scab on nose 4x5 mm  Lab Results  Component Value Date   WBC 8.1 08/05/2017   HGB 14.0 08/05/2017   HCT 41.3 08/05/2017   PLT 218 08/05/2017   GLUCOSE 94 08/05/2017   CHOL 168 09/28/2009   TRIG 218 (H) 09/28/2009   HDL 32 (L) 09/28/2009   LDLCALC 92 09/28/2009   ALT 24 09/28/2009   AST 27 09/28/2009   NA 141 08/05/2017   K 4.5 08/05/2017   CL 101 08/05/2017   CREATININE 1.37 (H) 08/05/2017   BUN 12 08/05/2017   CO2 26 08/05/2017   TSH 2.65 07/17/2016   PSA 0.19 06/16/2008   INR 1.0 08/05/2017   HGBA1C  12/13/2008    5.8 (NOTE) The ADA recommends the following therapeutic goal for glycemic control related to Hgb A1c measurement: Goal of therapy: <6.5 Hgb A1c  Reference: American Diabetes Association: Clinical Practice Recommendations 2010, Diabetes Care, 2010, 33: (Suppl  1).   MICROALBUR 1.03 06/06/2009    No results found.  Assessment & Plan:   Problem List Items Addressed This Visit     Neoplasm of uncertain behavior of skin    Scab on nose 4x5 mm --  New Derm ref if not resolved. Rx: Triamc cream      PTSD (post-traumatic stress disorder)    Stable Clonazepam renewed      Sinusitis    New Post-COVID Start Omnicef      Relevant Medications   cefdinir (OMNICEF) 300 MG capsule      Meds ordered this encounter  Medications   triamcinolone cream (KENALOG) 0.1 %    Sig: Apply 1 application topically 3 (three) times daily.    Dispense:  45 g    Refill:  1   clonazePAM (KLONOPIN) 1 MG tablet    Sig: Take 1 tablet (1 mg total) by mouth 2 (two) times daily.    Dispense:  60 tablet    Refill:  3    Maximum Refills Reached   cefdinir (OMNICEF) 300 MG capsule    Sig: Take 1 capsule (300 mg total) by mouth 2 (two) times daily.    Dispense:  20 capsule    Refill:  0        Follow-up: No follow-ups on file.  Walker Kehr, MD

## 2021-02-04 NOTE — Assessment & Plan Note (Addendum)
Stable Clonazepam renewed

## 2021-02-04 NOTE — Assessment & Plan Note (Addendum)
New Post-COVID Start [employer]

## 2021-05-07 ENCOUNTER — Ambulatory Visit (INDEPENDENT_AMBULATORY_CARE_PROVIDER_SITE_OTHER): Payer: Self-pay | Admitting: Internal Medicine

## 2021-05-07 ENCOUNTER — Other Ambulatory Visit: Payer: Self-pay

## 2021-05-07 ENCOUNTER — Encounter: Payer: Self-pay | Admitting: Internal Medicine

## 2021-05-07 VITALS — BP 126/78 | HR 65 | Temp 98.2°F | Ht 73.0 in | Wt 274.0 lb

## 2021-05-07 DIAGNOSIS — R635 Abnormal weight gain: Secondary | ICD-10-CM

## 2021-05-07 DIAGNOSIS — E669 Obesity, unspecified: Secondary | ICD-10-CM

## 2021-05-07 DIAGNOSIS — F4310 Post-traumatic stress disorder, unspecified: Secondary | ICD-10-CM

## 2021-05-07 LAB — TSH: TSH: 2.39 u[IU]/mL (ref 0.35–5.50)

## 2021-05-07 MED ORDER — CLONAZEPAM 1 MG PO TABS: 1.0000 mg | ORAL_TABLET | Freq: Two times a day (BID) | ORAL | 3 refills | Status: DC

## 2021-05-07 NOTE — Patient Instructions (Addendum)
Sign up for [employer] ( via [place] on your phone or your ipad). If you don't have a Art therapist card  - go to [employer] branch. They will set you up in 15 minutes. It is free. You can check out books to read and to listen, check out magazines and newspapers, movies etc.  The Obesity Code book by Sharman Cheek   These suggestions will probably help you to improve your metabolism if you are not overweight and to lose weight if you are overweight: 1.  Reduce your consumption of sugars and starches.  Eliminate high fructose corn syrup from your diet.  Reduce your consumption of processed foods.  For desserts try to have seasonal fruits, berries, nuts, cheeses or dark chocolate with more than 70% cacao. 2.  Do not snack 3.  You do not have to eat breakfast.  If you choose to have breakfast - eat plain greek yogurt, eggs, oatmeal (without sugar) - use honey if you need to. 4.  Drink water, freshly brewed unsweetened tea (green, black or herbal) or coffee.  Do not drink sodas including diet sodas , juices, beverages sweetened with artificial sweeteners. 5.  Reduce your consumption of refined grains. 6.  Avoid protein drinks such as Optifast, Slim fast etc. Eat chicken, fish, meat, dairy and beans for your sources of protein. 7.  Natural unprocessed fats like cold pressed virgin olive oil, butter, coconut oil are good for you.  Eat avocados. 8.  Increase your consumption of fiber.  Fruits, berries, vegetables, whole grains, flaxseed, chia seeds, beans, popcorn, nuts, oatmeal are good sources of fiber 9.  Use vinegar in your diet, i.e. apple cider vinegar, red wine or balsamic vinegar 10.  You can try fasting.  For example you can skip breakfast and lunch every other day (24-hour fast) 11.  Stress reduction, good night sleep, relaxation, meditation, yoga and other physical activity is likely to help you to maintain low weight too. 12.  If you drink alcohol, limit your alcohol intake to no more than 2  drinks a day.   Wt Readings from Last 3 Encounters:  05/07/21 274 lb (124.3 kg)  02/04/21 261 lb 9.6 oz (118.7 kg)  11/12/20 268 lb 9.6 oz (121.8 kg)

## 2021-05-07 NOTE — Assessment & Plan Note (Signed)
Cont on Celexa 40 mg daily.  Continue with clonazepam 1 mg twice daily as needed

## 2021-05-07 NOTE — Progress Notes (Signed)
Subjective:  Patient ID: Jeffrey [name], male    DOB: 1965-12-17  Age: 56 y.o. MRN: [id number]  CC: No chief complaint on file.   HPI Jeffrey [name] presents for PTSD, anxiety  Outpatient Medications Prior to Visit  Medication Sig Dispense Refill   atenolol (TENORMIN) 50 MG tablet TAKE 1 TABLET (50 MG TOTAL) BY MOUTH DAILY. 30 tablet 11   cefdinir (OMNICEF) 300 MG capsule Take 1 capsule (300 mg total) by mouth 2 (two) times daily. 20 capsule 0   Cholecalciferol (VITAMIN D3) 2000 UNITS capsule Take 1 capsule (2,000 Units total) by mouth daily. 100 capsule 3   citalopram (CELEXA) 40 MG tablet Take 1 tablet (40 mg total) by mouth daily. 30 tablet 5   ibuprofen (ADVIL,MOTRIN) 200 MG tablet Take 200-400 mg by mouth every 6 (six) hours as needed for headache or moderate pain.      Methadone HCl (DOLOPHINE PO) Take 130 mg by mouth daily.     triamcinolone cream (KENALOG) 0.1 % Apply 1 application topically 3 (three) times daily. 45 g 1   clonazePAM (KLONOPIN) 1 MG tablet Take 1 tablet (1 mg total) by mouth 2 (two) times daily. 60 tablet 3   hyoscyamine (LEVSIN) 0.125 MG tablet Take 1 tablet (0.125 mg total) by mouth every 4 (four) hours as needed for up to 10 days for cramping. 100 tablet 0   No facility-administered medications prior to visit.    ROS: Review of Systems  Constitutional:  Positive for unexpected weight change. Negative for appetite change and fatigue.  HENT:  Negative for congestion, nosebleeds, sneezing, sore throat and trouble swallowing.   Eyes:  Negative for itching and visual disturbance.  Respiratory:  Negative for cough.   Cardiovascular:  Negative for chest pain, palpitations and leg swelling.  Gastrointestinal:  Negative for abdominal distention, blood in stool, diarrhea and nausea.  Genitourinary:  Negative for frequency and hematuria.  Musculoskeletal:  Negative for back pain, gait problem, joint swelling and neck pain.  Skin:  Negative for rash.   Neurological:  Negative for dizziness, tremors, speech difficulty and weakness.  Psychiatric/Behavioral:  Negative for agitation, dysphoric mood, sleep disturbance and suicidal ideas. The patient is nervous/anxious.    Objective:  BP 126/78 (BP Location: Right Arm, Patient Position: Sitting, Cuff Size: Large)    Pulse 65    Temp 98.2 F (36.8 C) (Oral)    Ht 6\' 1"  (1.854 m)    Wt 274 lb (124.3 kg)    SpO2 99%    BMI 36.15 kg/m   BP Readings from Last 3 Encounters:  05/07/21 126/78  02/04/21 138/90  11/12/20 (!) 142/86    Wt Readings from Last 3 Encounters:  05/07/21 274 lb (124.3 kg)  02/04/21 261 lb 9.6 oz (118.7 kg)  11/12/20 268 lb 9.6 oz (121.8 kg)    Physical Exam Constitutional:      General: He is not in acute distress.    Appearance: He is well-developed. He is obese.     Comments: NAD  Eyes:     Conjunctiva/sclera: Conjunctivae normal.     Pupils: Pupils are equal, round, and reactive to light.  Neck:     Thyroid: No thyromegaly.     Vascular: No JVD.  Cardiovascular:     Rate and Rhythm: Normal rate and regular rhythm.     Heart sounds: Normal heart sounds. No murmur heard.   No friction rub. No gallop.  Pulmonary:     Effort: Pulmonary effort  is normal. No respiratory distress.     Breath sounds: Normal breath sounds. No wheezing or rales.  Chest:     Chest wall: No tenderness.  Abdominal:     General: Bowel sounds are normal. There is no distension.     Palpations: Abdomen is soft. There is no mass.     Tenderness: There is no abdominal tenderness. There is no guarding or rebound.  Musculoskeletal:        General: No tenderness. Normal range of motion.     Cervical back: Normal range of motion.  Lymphadenopathy:     Cervical: No cervical adenopathy.  Skin:    General: Skin is warm and dry.     Findings: No rash.  Neurological:     Mental Status: He is alert and oriented to person, place, and time.     Cranial Nerves: No cranial nerve deficit.      Motor: No abnormal muscle tone.     Coordination: Coordination normal.     Gait: Gait normal.     Deep Tendon Reflexes: Reflexes are normal and symmetric.  Psychiatric:        Behavior: Behavior normal.        Thought Content: Thought content normal.        Judgment: Judgment normal.    Lab Results  Component Value Date   WBC 8.1 08/05/2017   HGB 14.0 08/05/2017   HCT 41.3 08/05/2017   PLT 218 08/05/2017   GLUCOSE 94 08/05/2017   CHOL 168 09/28/2009   TRIG 218 (H) 09/28/2009   HDL 32 (L) 09/28/2009   LDLCALC 92 09/28/2009   ALT 24 09/28/2009   AST 27 09/28/2009   NA 141 08/05/2017   K 4.5 08/05/2017   CL 101 08/05/2017   CREATININE 1.37 (H) 08/05/2017   BUN 12 08/05/2017   CO2 26 08/05/2017   TSH 2.65 07/17/2016   PSA 0.19 06/16/2008   INR 1.0 08/05/2017   HGBA1C  12/13/2008    5.8 (NOTE) The ADA recommends the following therapeutic goal for glycemic control related to Hgb A1c measurement: Goal of therapy: <6.5 Hgb A1c  Reference: American Diabetes Association: Clinical Practice Recommendations 2010, Diabetes Care, 2010, 33: (Suppl  1).   MICROALBUR 1.03 06/06/2009    No results found.  Assessment & Plan:   Problem List Items Addressed This Visit     Obesity (BMI 30.0-34.9)    Worse Low carb diet, intermittent fasting adviced      Relevant Orders   Comprehensive metabolic panel   TSH   PTSD (post-traumatic stress disorder)    Cont on Celexa 40 mg daily.  Continue with clonazepam 1 mg twice daily as needed      Other Visit Diagnoses     Weight gain    -  Primary   Relevant Orders   Comprehensive metabolic panel   TSH         Meds ordered this encounter  Medications   clonazePAM (KLONOPIN) 1 MG tablet    Sig: Take 1 tablet (1 mg total) by mouth 2 (two) times daily.    Dispense:  60 tablet    Refill:  3    Maximum Refills Reached      Follow-up: Return in about 3 months (around 08/04/2021) for a follow-up visit.  Walker Kehr, MD

## 2021-05-07 NOTE — Assessment & Plan Note (Addendum)
Worse - gaining wt Low carb diet, intermittent fasting adviced

## 2021-05-08 ENCOUNTER — Other Ambulatory Visit: Payer: Self-pay | Admitting: Internal Medicine

## 2021-05-08 LAB — COMPREHENSIVE METABOLIC PANEL
ALT: 16 U/L (ref 0–53)
AST: 19 U/L (ref 0–37)
Albumin: 3.7 g/dL (ref 3.5–5.2)
Alkaline Phosphatase: 111 U/L (ref 39–117)
BUN: 12 mg/dL (ref 6–23)
CO2: 32 mEq/L (ref 19–32)
Calcium: 8.5 mg/dL (ref 8.4–10.5)
Chloride: 98 mEq/L (ref 96–112)
Creatinine, Ser: 1.17 mg/dL (ref 0.40–1.50)
GFR: 68.97 mL/min (ref >60.00)
Glucose, Bld: 316 mg/dL — ABNORMAL HIGH (ref 70–99)
Potassium: 4.3 mEq/L (ref 3.5–5.1)
Sodium: 135 mEq/L (ref 135–145)
Total Bilirubin: 0.3 mg/dL (ref 0.2–1.2)
Total Protein: 7.2 g/dL (ref 6.0–8.3)

## 2021-05-12 ENCOUNTER — Encounter: Payer: Self-pay | Admitting: Internal Medicine

## 2021-05-12 DIAGNOSIS — E1165 Type 2 diabetes mellitus with hyperglycemia: Secondary | ICD-10-CM | POA: Insufficient documentation

## 2021-05-16 ENCOUNTER — Ambulatory Visit: Payer: Self-pay | Admitting: Nurse Practitioner

## 2021-05-27 ENCOUNTER — Encounter: Payer: Self-pay | Admitting: Internal Medicine

## 2021-05-27 ENCOUNTER — Other Ambulatory Visit: Payer: Self-pay

## 2021-05-27 ENCOUNTER — Ambulatory Visit (INDEPENDENT_AMBULATORY_CARE_PROVIDER_SITE_OTHER): Payer: Self-pay | Admitting: Internal Medicine

## 2021-05-27 DIAGNOSIS — E669 Obesity, unspecified: Secondary | ICD-10-CM

## 2021-05-27 DIAGNOSIS — R251 Tremor, unspecified: Secondary | ICD-10-CM

## 2021-05-27 DIAGNOSIS — E1165 Type 2 diabetes mellitus with hyperglycemia: Secondary | ICD-10-CM

## 2021-05-27 MED ORDER — METFORMIN HCL 500 MG PO TABS: 500.0000 mg | ORAL_TABLET | Freq: Two times a day (BID) | ORAL | 3 refills | Status: DC

## 2021-05-27 NOTE — Assessment & Plan Note (Signed)
Worse  Discussed  

## 2021-05-27 NOTE — Assessment & Plan Note (Addendum)
Pt has cut out all sodas (was drinking 4-5 Pepsi's a day), cut back on starches, lost wt. The highest was 148. Gaspar Bidding knows about diet, exercise, wt loss need. ?Start Metformin ?RTC 3 mo ? ?

## 2021-05-27 NOTE — Progress Notes (Signed)
? ?Subjective:  ?Patient ID: Jeffrey [name], male    DOB: [birth year]  Age: 56 y.o. MRN: [id number] ? ?CC: Follow-up (No concerns) ? ? ?HPI ?Jeffrey [name] presents for a new DM - he has cut out all sodas (was drinking 4-5 Pepsi's a day), cut back on starches, lost wt. The highest was 148. ? ?Outpatient Medications Prior to Visit  ?Medication Sig Dispense Refill  ? atenolol (TENORMIN) 50 MG tablet TAKE 1 TABLET (50 MG TOTAL) BY MOUTH DAILY. 30 tablet 11  ? cefdinir (OMNICEF) 300 MG capsule Take 1 capsule (300 mg total) by mouth 2 (two) times daily. 20 capsule 0  ? Cholecalciferol (VITAMIN D3) 2000 UNITS capsule Take 1 capsule (2,000 Units total) by mouth daily. 100 capsule 3  ? citalopram (CELEXA) 40 MG tablet TAKE 1 TABLET (40 MG TOTAL) BY MOUTH DAILY. 30 tablet 5  ? clonazePAM (KLONOPIN) 1 MG tablet Take 1 tablet (1 mg total) by mouth 2 (two) times daily. 60 tablet 3  ? hyoscyamine (LEVSIN) 0.125 MG tablet Take 1 tablet (0.125 mg total) by mouth every 4 (four) hours as needed for up to 10 days for cramping. 100 tablet 0  ? ibuprofen (ADVIL,MOTRIN) 200 MG tablet Take 200-400 mg by mouth every 6 (six) hours as needed for headache or moderate pain.     ? Methadone HCl (DOLOPHINE PO) Take 130 mg by mouth daily.    ? triamcinolone cream (KENALOG) 0.1 % Apply 1 application topically 3 (three) times daily. 45 g 1  ? ?No facility-administered medications prior to visit.  ? ? ?ROS: ?Review of Systems  ?Constitutional:  Negative for appetite change, fatigue and unexpected weight change.  ?HENT:  Negative for congestion, nosebleeds, sneezing, sore throat and trouble swallowing.   ?Eyes:  Negative for itching and visual disturbance.  ?Respiratory:  Negative for cough.   ?Cardiovascular:  Negative for chest pain, palpitations and leg swelling.  ?Gastrointestinal:  Negative for abdominal distention, blood in stool, diarrhea and nausea.  ?Genitourinary:  Negative for frequency and hematuria.  ?Musculoskeletal:  Negative for  back pain, gait problem, joint swelling and neck pain.  ?Skin:  Negative for rash.  ?Neurological:  Positive for tremors. Negative for dizziness, speech difficulty and weakness.  ?Psychiatric/Behavioral:  Negative for agitation, dysphoric mood and sleep disturbance. The patient is nervous/anxious.   ? ?Objective:  ?BP (!) 148/86 (BP Location: Left Arm, Patient Position: Sitting, Cuff Size: Large)   Pulse 70   Temp 98.1 ?F (36.7 ?C) (Oral)   Ht '6\' 1"'$  (1.854 m)   Wt 268 lb 9.6 oz (121.8 kg)   SpO2 98%   BMI 35.44 kg/m?  ? ?BP Readings from Last 3 Encounters:  ?05/27/21 (!) 148/86  ?05/07/21 126/78  ?02/04/21 138/90  ? ? ?Wt Readings from Last 3 Encounters:  ?05/27/21 268 lb 9.6 oz (121.8 kg)  ?05/07/21 274 lb (124.3 kg)  ?02/04/21 261 lb 9.6 oz (118.7 kg)  ? ? ?Physical Exam ?Constitutional:   ?   General: He is not in acute distress. ?   Appearance: He is well-developed. He is obese.  ?   Comments: NAD  ?Eyes:  ?   Conjunctiva/sclera: Conjunctivae normal.  ?   Pupils: Pupils are equal, round, and reactive to light.  ?Neck:  ?   Thyroid: No thyromegaly.  ?   Vascular: No JVD.  ?Cardiovascular:  ?   Rate and Rhythm: Normal rate and regular rhythm.  ?   Heart sounds: Normal heart sounds. No murmur  heard. ?  No friction rub. No gallop.  ?Pulmonary:  ?   Effort: Pulmonary effort is normal. No respiratory distress.  ?   Breath sounds: Normal breath sounds. No wheezing or rales.  ?Chest:  ?   Chest wall: No tenderness.  ?Abdominal:  ?   General: Bowel sounds are normal. There is no distension.  ?   Palpations: Abdomen is soft. There is no mass.  ?   Tenderness: There is no abdominal tenderness. There is no guarding or rebound.  ?Musculoskeletal:     ?   General: No tenderness. Normal range of motion.  ?   Cervical back: Normal range of motion.  ?Lymphadenopathy:  ?   Cervical: No cervical adenopathy.  ?Skin: ?   General: Skin is warm and dry.  ?   Findings: No rash.  ?Neurological:  ?   Mental Status: He is alert and  oriented to person, place, and time.  ?   Cranial Nerves: No cranial nerve deficit.  ?   Motor: No abnormal muscle tone.  ?   Coordination: Coordination normal.  ?   Gait: Gait normal.  ?   Deep Tendon Reflexes: Reflexes are normal and symmetric.  ?Psychiatric:     ?   Behavior: Behavior normal.     ?   Thought Content: Thought content normal.     ?   Judgment: Judgment normal.  ? ?L side tremor - ?worse ?Lab Results  ?Component Value Date  ? WBC 8.1 08/05/2017  ? HGB 14.0 08/05/2017  ? HCT 41.3 08/05/2017  ? PLT 218 08/05/2017  ? GLUCOSE 316 (H) 05/07/2021  ? CHOL 168 09/28/2009  ? TRIG 218 (H) 09/28/2009  ? HDL 32 (L) 09/28/2009  ? Luke 92 09/28/2009  ? ALT 16 05/07/2021  ? AST 19 05/07/2021  ? NA 135 05/07/2021  ? K 4.3 05/07/2021  ? CL 98 05/07/2021  ? CREATININE 1.17 05/07/2021  ? BUN 12 05/07/2021  ? CO2 32 05/07/2021  ? TSH 2.39 05/07/2021  ? PSA 0.19 06/16/2008  ? INR 1.0 08/05/2017  ? HGBA1C  12/13/2008  ?  5.8 ?(NOTE) The ADA recommends the following therapeutic goal for glycemic control related to Hgb A1c measurement: Goal of therapy: <6.5 Hgb A1c  Reference: American Diabetes Association: Clinical Practice Recommendations 2010, Diabetes Care, 2010, 33: (Suppl ? 1).  ? MICROALBUR 1.03 06/06/2009  ? ? ?No results found. ? ?Assessment & Plan:  ? ?Problem List Items Addressed This Visit   ? ? Obesity (BMI 35.0-39.9 without comorbidity)  ?  Better ?Cont on diet ?  ?  ? Relevant Medications  ? metFORMIN (GLUCOPHAGE) 500 MG tablet  ? Tremor  ?  Worse ?Discussed ? ?  ?  ? Type 2 diabetes mellitus with hyperglycemia (HCC)  ?  Pt has cut out all sodas (was drinking 4-5 Pepsi's a day), cut back on starches, lost wt. The highest was 148. Gaspar Bidding knows about diet, exercise, wt loss need. ?Start Metformin ?RTC 3 mo ? ?  ?  ? Relevant Medications  ? metFORMIN (GLUCOPHAGE) 500 MG tablet  ?  ? ? ?Meds ordered this encounter  ?Medications  ? metFORMIN (GLUCOPHAGE) 500 MG tablet  ?  Sig: Take 1 tablet (500 mg total) by  mouth 2 (two) times daily with a meal.  ?  Dispense:  180 tablet  ?  Refill:  3  ?  ? ? ?Follow-up: Return for a follow-up visit. ? ?Walker Kehr, MD ?

## 2021-05-27 NOTE — Assessment & Plan Note (Signed)
Better ?Cont on diet ?

## 2021-08-06 ENCOUNTER — Ambulatory Visit: Payer: Self-pay | Admitting: Internal Medicine

## 2021-08-13 ENCOUNTER — Ambulatory Visit: Payer: Self-pay | Admitting: Internal Medicine

## 2021-08-29 ENCOUNTER — Ambulatory Visit (INDEPENDENT_AMBULATORY_CARE_PROVIDER_SITE_OTHER): Payer: Self-pay | Admitting: Internal Medicine

## 2021-08-29 ENCOUNTER — Encounter: Payer: Self-pay | Admitting: Internal Medicine

## 2021-08-29 DIAGNOSIS — R251 Tremor, unspecified: Secondary | ICD-10-CM

## 2021-08-29 DIAGNOSIS — F1121 Opioid dependence, in remission: Secondary | ICD-10-CM

## 2021-08-29 DIAGNOSIS — F4310 Post-traumatic stress disorder, unspecified: Secondary | ICD-10-CM

## 2021-08-29 DIAGNOSIS — E669 Obesity, unspecified: Secondary | ICD-10-CM

## 2021-08-29 DIAGNOSIS — E1165 Type 2 diabetes mellitus with hyperglycemia: Secondary | ICD-10-CM

## 2021-08-29 MED ORDER — CLONAZEPAM 1 MG PO TABS: 1.0000 mg | ORAL_TABLET | Freq: Two times a day (BID) | ORAL | 3 refills | Status: DC

## 2021-08-29 MED ORDER — CITALOPRAM HYDROBROMIDE 40 MG PO TABS: 40.0000 mg | ORAL_TABLET | Freq: Every day | ORAL | 5 refills | Status: DC

## 2021-08-29 MED ORDER — ATENOLOL 50 MG PO TABS: 50.0000 mg | ORAL_TABLET | Freq: Every day | ORAL | 11 refills | Status: DC

## 2021-08-29 NOTE — Progress Notes (Signed)
Subjective:  Patient ID: Jeffrey [name], male    DOB: February 06, 1966  Age: 56 y.o. MRN: [id number]  CC: No chief complaint on file.   HPI Jeffrey [name] presents for anxiety/PTSD, DM, elevated BP  Outpatient Medications Prior to Visit  Medication Sig Dispense Refill   atenolol (TENORMIN) 50 MG tablet TAKE 1 TABLET (50 MG TOTAL) BY MOUTH DAILY. 30 tablet 11   cefdinir (OMNICEF) 300 MG capsule Take 1 capsule (300 mg total) by mouth 2 (two) times daily. 20 capsule 0   Cholecalciferol (VITAMIN D3) 2000 UNITS capsule Take 1 capsule (2,000 Units total) by mouth daily. 100 capsule 3   citalopram (CELEXA) 40 MG tablet TAKE 1 TABLET (40 MG TOTAL) BY MOUTH DAILY. 30 tablet 5   clonazePAM (KLONOPIN) 1 MG tablet Take 1 tablet (1 mg total) by mouth 2 (two) times daily. 60 tablet 3   ibuprofen (ADVIL,MOTRIN) 200 MG tablet Take 200-400 mg by mouth every 6 (six) hours as needed for headache or moderate pain.      metFORMIN (GLUCOPHAGE) 500 MG tablet Take 1 tablet (500 mg total) by mouth 2 (two) times daily with a meal. 180 tablet 3   Methadone HCl (DOLOPHINE PO) Take 130 mg by mouth daily.     triamcinolone cream (KENALOG) 0.1 % Apply 1 application topically 3 (three) times daily. 45 g 1   hyoscyamine (LEVSIN) 0.125 MG tablet Take 1 tablet (0.125 mg total) by mouth every 4 (four) hours as needed for up to 10 days for cramping. 100 tablet 0   No facility-administered medications prior to visit.    ROS: Review of Systems  Constitutional:  Negative for appetite change, fatigue and unexpected weight change.  HENT:  Negative for congestion, nosebleeds, sneezing, sore throat and trouble swallowing.   Eyes:  Negative for itching and visual disturbance.  Respiratory:  Negative for cough.   Cardiovascular:  Negative for chest pain, palpitations and leg swelling.  Gastrointestinal:  Negative for abdominal distention, blood in stool, diarrhea and nausea.  Genitourinary:  Negative for frequency and  hematuria.  Musculoskeletal:  Negative for back pain, gait problem, joint swelling and neck pain.  Skin:  Negative for rash.  Neurological:  Positive for tremors. Negative for dizziness, speech difficulty and weakness.  Psychiatric/Behavioral:  Negative for agitation, dysphoric mood, sleep disturbance and suicidal ideas. The patient is nervous/anxious.     Objective:  BP 130/90 (BP Location: Left Arm, Patient Position: Sitting, Cuff Size: Large)   Pulse 60   Temp 97.9 F (36.6 C) (Oral)   Ht '6\' 1"'$  (1.854 m)   Wt 267 lb (121.1 kg)   SpO2 97%   BMI 35.23 kg/m   BP Readings from Last 3 Encounters:  08/29/21 130/90  05/27/21 (!) 148/86  05/07/21 126/78    Wt Readings from Last 3 Encounters:  08/29/21 267 lb (121.1 kg)  05/27/21 268 lb 9.6 oz (121.8 kg)  05/07/21 274 lb (124.3 kg)    Physical Exam Constitutional:      General: He is not in acute distress.    Appearance: He is well-developed.     Comments: NAD  Eyes:     Conjunctiva/sclera: Conjunctivae normal.     Pupils: Pupils are equal, round, and reactive to light.  Neck:     Thyroid: No thyromegaly.     Vascular: No JVD.  Cardiovascular:     Rate and Rhythm: Normal rate and regular rhythm.     Heart sounds: Normal heart sounds. No murmur  heard.    No friction rub. No gallop.  Pulmonary:     Effort: Pulmonary effort is normal. No respiratory distress.     Breath sounds: Normal breath sounds. No wheezing or rales.  Chest:     Chest wall: No tenderness.  Abdominal:     General: Bowel sounds are normal. There is no distension.     Palpations: Abdomen is soft. There is no mass.     Tenderness: There is no abdominal tenderness. There is no guarding or rebound.  Musculoskeletal:        General: No tenderness. Normal range of motion.     Cervical back: Normal range of motion.  Lymphadenopathy:     Cervical: No cervical adenopathy.  Skin:    General: Skin is warm and dry.     Findings: No rash.  Neurological:      Mental Status: He is alert and oriented to person, place, and time.     Cranial Nerves: No cranial nerve deficit.     Motor: No abnormal muscle tone.     Coordination: Coordination normal.     Gait: Gait normal.     Deep Tendon Reflexes: Reflexes are normal and symmetric.  Psychiatric:        Behavior: Behavior normal.        Thought Content: Thought content normal.        Judgment: Judgment normal.   L tongue and lip trembling  Lab Results  Component Value Date   WBC 8.1 08/05/2017   HGB 14.0 08/05/2017   HCT 41.3 08/05/2017   PLT 218 08/05/2017   GLUCOSE 316 (H) 05/07/2021   CHOL 168 09/28/2009   TRIG 218 (H) 09/28/2009   HDL 32 (L) 09/28/2009   LDLCALC 92 09/28/2009   ALT 16 05/07/2021   AST 19 05/07/2021   NA 135 05/07/2021   K 4.3 05/07/2021   CL 98 05/07/2021   CREATININE 1.17 05/07/2021   BUN 12 05/07/2021   CO2 32 05/07/2021   TSH 2.39 05/07/2021   PSA 0.19 06/16/2008   INR 1.0 08/05/2017   HGBA1C  12/13/2008    5.8 (NOTE) The ADA recommends the following therapeutic goal for glycemic control related to Hgb A1c measurement: Goal of therapy: <6.5 Hgb A1c  Reference: American Diabetes Association: Clinical Practice Recommendations 2010, Diabetes Care, 2010, 33: (Suppl  1).   MICROALBUR 1.03 06/06/2009    No results found.  Assessment & Plan:   Problem List Items Addressed This Visit     Obesity (BMI 35.0-39.9 without comorbidity)    Wt Readings from Last 3 Encounters:  08/29/21 267 lb (121.1 kg)  05/27/21 268 lb 9.6 oz (121.8 kg)  05/07/21 274 lb (124.3 kg)        Opioid dependence ([place])    [facility]rug [facility]      PTSD (post-traumatic stress disorder)    Cont on Celexa 40 mg daily.  Continue with clonazepam 1 mg twice daily as needed  Potential benefits of a long term benzodiazepines  use as well as potential risks  and complications were explained to the patient and were aknowledged. Drug screens at [facility]  monthly.      Type 2 diabetes mellitus with hyperglycemia (HCC)    On Metformin CBGs are <120 at home         No orders of the defined types were placed in this encounter.     Follow-up: No follow-ups on file.  Walker Kehr, MD

## 2021-08-29 NOTE — Assessment & Plan Note (Signed)
On Metformin CBGs are <120 at home

## 2021-08-29 NOTE — Assessment & Plan Note (Signed)
Cont on Celexa 40 mg daily.  Continue with clonazepam 1 mg twice daily as needed  Potential benefits of a long term benzodiazepines  use as well as potential risks  and complications were explained to the patient and were aknowledged. Drug screens at [facility] monthly.

## 2021-08-29 NOTE — Assessment & Plan Note (Signed)
Drug screens at [facility] monthly

## 2021-08-29 NOTE — Assessment & Plan Note (Signed)
Wt Readings from Last 3 Encounters:  08/29/21 267 lb (121.1 kg)  05/27/21 268 lb 9.6 oz (121.8 kg)  05/07/21 274 lb (124.3 kg)

## 2021-08-29 NOTE — Assessment & Plan Note (Addendum)
No change L tongue and lip trembling

## 2021-11-21 ENCOUNTER — Ambulatory Visit (INDEPENDENT_AMBULATORY_CARE_PROVIDER_SITE_OTHER): Payer: Self-pay | Admitting: Internal Medicine

## 2021-11-21 ENCOUNTER — Encounter: Payer: Self-pay | Admitting: Internal Medicine

## 2021-11-21 DIAGNOSIS — F4310 Post-traumatic stress disorder, unspecified: Secondary | ICD-10-CM

## 2021-11-21 DIAGNOSIS — R251 Tremor, unspecified: Secondary | ICD-10-CM

## 2021-11-21 DIAGNOSIS — E669 Obesity, unspecified: Secondary | ICD-10-CM

## 2021-11-21 DIAGNOSIS — F1121 Opioid dependence, in remission: Secondary | ICD-10-CM

## 2021-11-21 DIAGNOSIS — E1165 Type 2 diabetes mellitus with hyperglycemia: Secondary | ICD-10-CM

## 2021-11-21 DIAGNOSIS — I10 Essential (primary) hypertension: Secondary | ICD-10-CM

## 2021-11-21 MED ORDER — CLONAZEPAM 1 MG PO TABS
1.0000 mg | ORAL_TABLET | Freq: Two times a day (BID) | ORAL | 3 refills | Status: DC
Start: 2021-11-21 — End: 2022-02-20

## 2021-11-21 MED ORDER — FREESTYLE LIBRE 3 SENSOR MISC: 1.0000 [IU] | 3 refills | Status: AC

## 2021-11-21 NOTE — Assessment & Plan Note (Signed)
On Methadone

## 2021-11-21 NOTE — Assessment & Plan Note (Signed)
NAS diet Loose wt On Tenormin po

## 2021-11-21 NOTE — Assessment & Plan Note (Signed)
Diet, exercise discussed Low carb diet, intermittent fasting adviced

## 2021-11-21 NOTE — Progress Notes (Signed)
Subjective:  Patient ID: Jeffrey [name], male    DOB: 11-May-1965  Age: 56 y.o. MRN: [id number]  CC: Follow-up (3 month f/u)   HPI Jeffrey [name] presents for HTN, anxiety, opioid dependence, DM  Outpatient Medications Prior to Visit  Medication Sig Dispense Refill   atenolol (TENORMIN) 50 MG tablet Take 1 tablet (50 mg total) by mouth daily. 30 tablet 11   Cholecalciferol (VITAMIN D3) 2000 UNITS capsule Take 1 capsule (2,000 Units total) by mouth daily. 100 capsule 3   citalopram (CELEXA) 40 MG tablet Take 1 tablet (40 mg total) by mouth daily. 30 tablet 5   ibuprofen (ADVIL,MOTRIN) 200 MG tablet Take 200-400 mg by mouth every 6 (six) hours as needed for headache or moderate pain.      metFORMIN (GLUCOPHAGE) 500 MG tablet Take 1 tablet (500 mg total) by mouth 2 (two) times daily with a meal. 180 tablet 3   Methadone HCl (DOLOPHINE PO) Take 130 mg by mouth daily.     triamcinolone cream (KENALOG) 0.1 % Apply 1 application topically 3 (three) times daily. 45 g 1   clonazePAM (KLONOPIN) 1 MG tablet Take 1 tablet (1 mg total) by mouth 2 (two) times daily. 60 tablet 3   hyoscyamine (LEVSIN) 0.125 MG tablet Take 1 tablet (0.125 mg total) by mouth every 4 (four) hours as needed for up to 10 days for cramping. 100 tablet 0   cefdinir (OMNICEF) 300 MG capsule Take 1 capsule (300 mg total) by mouth 2 (two) times daily. (Patient not taking: Reported on 11/21/2021) 20 capsule 0   No facility-administered medications prior to visit.    ROS: Review of Systems  Constitutional:  Positive for fatigue. Negative for appetite change and unexpected weight change.  HENT:  Negative for congestion, nosebleeds, sneezing, sore throat and trouble swallowing.   Eyes:  Negative for itching and visual disturbance.  Respiratory:  Negative for cough.   Cardiovascular:  Negative for chest pain, palpitations and leg swelling.  Gastrointestinal:  Negative for abdominal distention, blood in stool, diarrhea and  nausea.  Genitourinary:  Negative for frequency and hematuria.  Musculoskeletal:  Negative for back pain, gait problem, joint swelling and neck pain.  Skin:  Negative for rash.  Neurological:  Positive for tremors. Negative for dizziness, speech difficulty and weakness.  Psychiatric/Behavioral:  Negative for agitation, dysphoric mood and sleep disturbance. The patient is not nervous/anxious.     Objective:  BP (!) 142/82 (BP Location: Left Arm)   Pulse 62   Temp (!) 97 F (36.1 C) (Oral)   Ht '6\' 1"'$  (1.854 m)   Wt 267 lb 12.8 oz (121.5 kg)   SpO2 97%   BMI 35.33 kg/m   BP Readings from Last 3 Encounters:  11/21/21 (!) 142/82  08/29/21 130/90  05/27/21 (!) 148/86    Wt Readings from Last 3 Encounters:  11/21/21 267 lb 12.8 oz (121.5 kg)  08/29/21 267 lb (121.1 kg)  05/27/21 268 lb 9.6 oz (121.8 kg)    Physical Exam Constitutional:      General: He is not in acute distress.    Appearance: He is well-developed. He is obese.     Comments: NAD  Eyes:     Conjunctiva/sclera: Conjunctivae normal.     Pupils: Pupils are equal, round, and reactive to light.  Neck:     Thyroid: No thyromegaly.     Vascular: No JVD.  Cardiovascular:     Rate and Rhythm: Normal rate and regular rhythm.  Heart sounds: Normal heart sounds. No murmur heard.    No friction rub. No gallop.  Pulmonary:     Effort: Pulmonary effort is normal. No respiratory distress.     Breath sounds: Normal breath sounds. No wheezing or rales.  Chest:     Chest wall: No tenderness.  Abdominal:     General: Bowel sounds are normal. There is no distension.     Palpations: Abdomen is soft. There is no mass.     Tenderness: There is no abdominal tenderness. There is no guarding or rebound.  Musculoskeletal:        General: No tenderness. Normal range of motion.     Cervical back: Normal range of motion.  Lymphadenopathy:     Cervical: No cervical adenopathy.  Skin:    General: Skin is warm and dry.      Findings: No rash.  Neurological:     Mental Status: He is alert and oriented to person, place, and time.     Cranial Nerves: No cranial nerve deficit.     Motor: No abnormal muscle tone.     Coordination: Coordination normal.     Gait: Gait normal.     Deep Tendon Reflexes: Reflexes are normal and symmetric.  Psychiatric:        Behavior: Behavior normal.        Thought Content: Thought content normal.        Judgment: Judgment normal.   tremors  Lab Results  Component Value Date   WBC 8.1 08/05/2017   HGB 14.0 08/05/2017   HCT 41.3 08/05/2017   PLT 218 08/05/2017   GLUCOSE 316 (H) 05/07/2021   CHOL 168 09/28/2009   TRIG 218 (H) 09/28/2009   HDL 32 (L) 09/28/2009   LDLCALC 92 09/28/2009   ALT 16 05/07/2021   AST 19 05/07/2021   NA 135 05/07/2021   K 4.3 05/07/2021   CL 98 05/07/2021   CREATININE 1.17 05/07/2021   BUN 12 05/07/2021   CO2 32 05/07/2021   TSH 2.39 05/07/2021   PSA 0.19 06/16/2008   INR 1.0 08/05/2017   HGBA1C  12/13/2008    5.8 (NOTE) The ADA recommends the following therapeutic goal for glycemic control related to Hgb A1c measurement: Goal of therapy: <6.5 Hgb A1c  Reference: American Diabetes Association: Clinical Practice Recommendations 2010, Diabetes Care, 2010, 33: (Suppl  1).   MICROALBUR 1.03 06/06/2009    No results found.  Assessment & Plan:   Problem List Items Addressed This Visit     HTN (hypertension)    NAS diet Loose wt On Tenormin po      Obesity (BMI 35.0-39.9 without comorbidity)    Diet, exercise discussed Low carb diet, intermittent fasting adviced      Opioid dependence (HCC)    On Methadone      PTSD (post-traumatic stress disorder)    Cont on Celexa 40 mg daily.  Continue with clonazepam 1 mg twice daily as needed  Potential benefits of a long term benzodiazepines  use as well as potential risks  and complications were explained to the patient and were aknowledged.      Tremor    No change      Type 2  diabetes mellitus with hyperglycemia ([place])    Will try CGM - Freestyle Libre 3 samples x2 On Metformin      Relevant Medications   Continuous Blood Gluc Sensor (FREESTYLE LIBRE 3 SENSOR) MISC      Meds ordered this  encounter  Medications   clonazePAM (KLONOPIN) 1 MG tablet    Sig: Take 1 tablet (1 mg total) by mouth 2 (two) times daily.    Dispense:  60 tablet    Refill:  3    Maximum Refills Reached   Continuous Blood Gluc Sensor (FREESTYLE LIBRE 3 SENSOR) MISC    Sig: 1 Units by Does not apply route every 14 (fourteen) days.    Dispense:  6 each    Refill:  3      Follow-up: Return in about 3 months (around 02/20/2022) for a follow-up visit.  Jeffrey Kehr, MD

## 2021-11-21 NOTE — Assessment & Plan Note (Signed)
Cont on Celexa 40 mg daily.  Continue with clonazepam 1 mg twice daily as needed  Potential benefits of a long term benzodiazepines  use as well as potential risks  and complications were explained to the patient and were aknowledged.

## 2021-11-21 NOTE — Assessment & Plan Note (Addendum)
Will try CGM - Freestyle Libre 3 samples x2 On Metformin

## 2021-11-21 NOTE — Assessment & Plan Note (Signed)
No change 

## 2021-11-28 ENCOUNTER — Ambulatory Visit: Payer: Self-pay | Admitting: Internal Medicine

## 2022-02-20 ENCOUNTER — Encounter: Payer: Self-pay | Admitting: Internal Medicine

## 2022-02-20 ENCOUNTER — Ambulatory Visit (INDEPENDENT_AMBULATORY_CARE_PROVIDER_SITE_OTHER): Payer: Self-pay | Admitting: Internal Medicine

## 2022-02-20 VITALS — BP 122/80 | HR 65 | Temp 98.2°F | Ht 73.0 in | Wt 258.0 lb

## 2022-02-20 DIAGNOSIS — E1165 Type 2 diabetes mellitus with hyperglycemia: Secondary | ICD-10-CM

## 2022-02-20 DIAGNOSIS — E669 Obesity, unspecified: Secondary | ICD-10-CM

## 2022-02-20 DIAGNOSIS — I10 Essential (primary) hypertension: Secondary | ICD-10-CM

## 2022-02-20 DIAGNOSIS — F4310 Post-traumatic stress disorder, unspecified: Secondary | ICD-10-CM

## 2022-02-20 MED ORDER — CLONAZEPAM 1 MG PO TABS: 1.0000 mg | ORAL_TABLET | Freq: Two times a day (BID) | ORAL | 3 refills | Status: DC

## 2022-02-20 NOTE — Assessment & Plan Note (Signed)
Continue with clonazepam 1 mg twice daily as needed.  Potential benefits of a long term benzodiazepines  use as well as potential risks  and complications were explained to the patient and were aknowledged. 

## 2022-02-20 NOTE — Assessment & Plan Note (Signed)
Pt lost 10 lbs

## 2022-02-20 NOTE — Assessment & Plan Note (Signed)
On Tenormin po

## 2022-02-20 NOTE — Progress Notes (Signed)
Subjective:  Patient ID: Jeffrey [name], male    DOB: [birth year]  Age: 56 y.o. MRN: [id number]  CC: Follow-up   HPI Jeffrey [name] presents for anxiety, DM CMG trial  - 110-115 glucose  Outpatient Medications Prior to Visit  Medication Sig Dispense Refill   atenolol (TENORMIN) 50 MG tablet Take 1 tablet (50 mg total) by mouth daily. 30 tablet 11   Cholecalciferol (VITAMIN D3) 2000 UNITS capsule Take 1 capsule (2,000 Units total) by mouth daily. 100 capsule 3   citalopram (CELEXA) 40 MG tablet Take 1 tablet (40 mg total) by mouth daily. 30 tablet 5   Continuous Blood Gluc Sensor (FREESTYLE LIBRE 3 SENSOR) MISC 1 Units by Does not apply route every 14 (fourteen) days. 6 each 3   ibuprofen (ADVIL,MOTRIN) 200 MG tablet Take 200-400 mg by mouth every 6 (six) hours as needed for headache or moderate pain.      metFORMIN (GLUCOPHAGE) 500 MG tablet Take 1 tablet (500 mg total) by mouth 2 (two) times daily with a meal. 180 tablet 3   Methadone HCl (DOLOPHINE PO) Take 130 mg by mouth daily.     triamcinolone cream (KENALOG) 0.1 % Apply 1 application topically 3 (three) times daily. 45 g 1   clonazePAM (KLONOPIN) 1 MG tablet Take 1 tablet (1 mg total) by mouth 2 (two) times daily. 60 tablet 3   hyoscyamine (LEVSIN) 0.125 MG tablet Take 1 tablet (0.125 mg total) by mouth every 4 (four) hours as needed for up to 10 days for cramping. 100 tablet 0   No facility-administered medications prior to visit.    ROS: Review of Systems  Constitutional:  Negative for appetite change, fatigue and unexpected weight change.  HENT:  Negative for congestion, nosebleeds, sneezing, sore throat and trouble swallowing.   Eyes:  Negative for itching and visual disturbance.  Respiratory:  Negative for cough.   Cardiovascular:  Negative for chest pain, palpitations and leg swelling.  Gastrointestinal:  Negative for abdominal distention, blood in stool, diarrhea and nausea.  Genitourinary:  Negative for  frequency and hematuria.  Musculoskeletal:  Negative for back pain, gait problem, joint swelling and neck pain.  Skin:  Negative for rash.  Neurological:  Negative for dizziness, tremors, speech difficulty and weakness.  Psychiatric/Behavioral:  Negative for agitation, dysphoric mood and sleep disturbance. The patient is nervous/anxious.     Objective:  BP 122/80 (BP Location: Left Arm, Patient Position: Sitting, Cuff Size: Normal)   Pulse 65   Temp 98.2 F (36.8 C) (Oral)   Ht '6\' 1"'$  (1.854 m)   Wt 258 lb (117 kg)   SpO2 98%   BMI 34.04 kg/m   BP Readings from Last 3 Encounters:  02/20/22 122/80  11/21/21 (!) 142/82  08/29/21 130/90    Wt Readings from Last 3 Encounters:  02/20/22 258 lb (117 kg)  11/21/21 267 lb 12.8 oz (121.5 kg)  08/29/21 267 lb (121.1 kg)    Physical Exam Constitutional:      General: He is not in acute distress.    Appearance: He is well-developed. He is obese.     Comments: NAD  Eyes:     Conjunctiva/sclera: Conjunctivae normal.     Pupils: Pupils are equal, round, and reactive to light.  Neck:     Thyroid: No thyromegaly.     Vascular: No JVD.  Cardiovascular:     Rate and Rhythm: Normal rate and regular rhythm.     Heart sounds: Normal heart sounds.  No murmur heard.    No friction rub. No gallop.  Pulmonary:     Effort: Pulmonary effort is normal. No respiratory distress.     Breath sounds: Normal breath sounds. No wheezing or rales.  Chest:     Chest wall: No tenderness.  Abdominal:     General: Bowel sounds are normal. There is no distension.     Palpations: Abdomen is soft. There is no mass.     Tenderness: There is no abdominal tenderness. There is no guarding or rebound.  Musculoskeletal:        General: No tenderness. Normal range of motion.     Cervical back: Normal range of motion.  Lymphadenopathy:     Cervical: No cervical adenopathy.  Skin:    General: Skin is warm and dry.     Findings: No rash.  Neurological:      Mental Status: He is alert and oriented to person, place, and time.     Cranial Nerves: No cranial nerve deficit.     Motor: No abnormal muscle tone.     Coordination: Coordination normal.     Gait: Gait normal.     Deep Tendon Reflexes: Reflexes are normal and symmetric.  Psychiatric:        Behavior: Behavior normal.        Thought Content: Thought content normal.        Judgment: Judgment normal.   Leg tremor  Lab Results  Component Value Date   WBC 8.1 08/05/2017   HGB 14.0 08/05/2017   HCT 41.3 08/05/2017   PLT 218 08/05/2017   GLUCOSE 316 (H) 05/07/2021   CHOL 168 09/28/2009   TRIG 218 (H) 09/28/2009   HDL 32 (L) 09/28/2009   LDLCALC 92 09/28/2009   ALT 16 05/07/2021   AST 19 05/07/2021   NA 135 05/07/2021   K 4.3 05/07/2021   CL 98 05/07/2021   CREATININE 1.17 05/07/2021   BUN 12 05/07/2021   CO2 32 05/07/2021   TSH 2.39 05/07/2021   PSA 0.19 06/16/2008   INR 1.0 08/05/2017   HGBA1C  12/13/2008    5.8 (NOTE) The ADA recommends the following therapeutic goal for glycemic control related to Hgb A1c measurement: Goal of therapy: <6.5 Hgb A1c  Reference: American Diabetes Association: Clinical Practice Recommendations 2010, Diabetes Care, 2010, 33: (Suppl  1).   MICROALBUR 1.03 06/06/2009    No results found.  Assessment & Plan:   Problem List Items Addressed This Visit     HTN (hypertension)    On Tenormin po      Obesity (BMI 35.0-39.9 without comorbidity) - Primary    Pt lost 10 lbs      PTSD (post-traumatic stress disorder)    Continue with clonazepam 1 mg twice daily as needed.  Potential benefits of a long term benzodiazepines  use as well as potential risks  and complications were explained to the patient and were aknowledged.       Type 2 diabetes mellitus with hyperglycemia (HCC)    On Metformin         Meds ordered this encounter  Medications   clonazePAM (KLONOPIN) 1 MG tablet    Sig: Take 1 tablet (1 mg total) by mouth 2 (two)  times daily.    Dispense:  60 tablet    Refill:  3      Follow-up: Return in about 3 months (around 05/22/2022) for a follow-up visit.  Walker Kehr, MD

## 2022-02-20 NOTE — Assessment & Plan Note (Signed)
On Metformin

## 2022-04-24 ENCOUNTER — Telehealth: Payer: Self-pay | Admitting: Internal Medicine

## 2022-04-24 ENCOUNTER — Other Ambulatory Visit: Payer: Self-pay | Admitting: Internal Medicine

## 2022-04-24 MED ORDER — CITALOPRAM HYDROBROMIDE 40 MG PO TABS: 40.0000 mg | ORAL_TABLET | Freq: Every day | ORAL | 0 refills | Status: DC

## 2022-04-24 NOTE — Telephone Encounter (Signed)
Pt is needing Rx refill for citalopram (CELEXA) 40 MG tablet and clonazePAM (KLONOPIN) 1 MG tablet.  Pt ph[facility] closes on the weekend

## 2022-04-24 NOTE — Telephone Encounter (Signed)
Sent 30 day on citalopram until appt pls advise on controlled.Marland KitchenChryl Heck

## 2022-04-24 NOTE — Addendum Note (Signed)
Addended by: Earnstine Regal on: 04/24/2022 02:30 PM   Modules accepted: Orders

## 2022-04-28 MED ORDER — CITALOPRAM HYDROBROMIDE 40 MG PO TABS: 40.0000 mg | ORAL_TABLET | Freq: Every day | ORAL | 5 refills | Status: DC

## 2022-04-28 NOTE — Addendum Note (Signed)
Addended by: Cassandria Anger on: 04/28/2022 07:52 AM   Modules accepted: Orders

## 2022-04-28 NOTE — Telephone Encounter (Signed)
Okay.  Thanks.

## 2022-05-21 ENCOUNTER — Encounter: Payer: Self-pay | Admitting: Internal Medicine

## 2022-05-21 ENCOUNTER — Ambulatory Visit (INDEPENDENT_AMBULATORY_CARE_PROVIDER_SITE_OTHER): Payer: Self-pay | Admitting: Internal Medicine

## 2022-05-21 VITALS — BP 124/78 | HR 70 | Temp 98.2°F | Ht 73.0 in | Wt 250.0 lb

## 2022-05-21 DIAGNOSIS — R251 Tremor, unspecified: Secondary | ICD-10-CM

## 2022-05-21 DIAGNOSIS — F1121 Opioid dependence, in remission: Secondary | ICD-10-CM

## 2022-05-21 DIAGNOSIS — F4310 Post-traumatic stress disorder, unspecified: Secondary | ICD-10-CM

## 2022-05-21 DIAGNOSIS — B029 Zoster without complications: Secondary | ICD-10-CM | POA: Insufficient documentation

## 2022-05-21 MED ORDER — VALACYCLOVIR HCL 1 G PO TABS: 1000.0000 mg | ORAL_TABLET | Freq: Three times a day (TID) | ORAL | 0 refills | Status: AC

## 2022-05-21 MED ORDER — DOXYCYCLINE HYCLATE 100 MG PO TABS: 100.0000 mg | ORAL_TABLET | Freq: Two times a day (BID) | ORAL | 1 refills | Status: DC

## 2022-05-21 MED ORDER — ATENOLOL 50 MG PO TABS: 50.0000 mg | ORAL_TABLET | Freq: Every day | ORAL | 11 refills | Status: DC

## 2022-05-21 MED ORDER — CLONAZEPAM 1 MG PO TABS: 1.0000 mg | ORAL_TABLET | Freq: Two times a day (BID) | ORAL | 3 refills | Status: DC

## 2022-05-21 NOTE — Patient Instructions (Signed)
Use BoilEase

## 2022-05-21 NOTE — Assessment & Plan Note (Signed)
04/2022  R upper lip rash shingles vs sever H simplex. Possible cellulitis Start Valtrex and Doxy. Use BoilEase

## 2022-05-21 NOTE — Assessment & Plan Note (Signed)
On Methadone since 1993 - [facility] clinic - 4 days a week [facility]

## 2022-05-21 NOTE — Progress Notes (Signed)
Subjective:  Patient ID: Jeffrey [name], male    DOB: Oct 21, 1965  Age: 57 y.o. MRN: [id number]  CC: Follow-up (Sore when side of lip started about a week ago has went down in size and is painful to touch )   HPI Jackston Montanez Niesen presents for a 3 months f/u  C/o fever blisters on the R upper lip since Mon this week - much worse. The worst day was yesterday; less swollen today       Outpatient Medications Prior to Visit  Medication Sig Dispense Refill   Cholecalciferol (VITAMIN D3) 2000 UNITS capsule Take 1 capsule (2,000 Units total) by mouth daily. 100 capsule 3   citalopram (CELEXA) 40 MG tablet Take 1 tablet (40 mg total) by mouth daily. Must keep 05/21/22 appt for future refills 30 tablet 5   Continuous Blood Gluc Sensor (FREESTYLE LIBRE 3 SENSOR) MISC 1 Units by Does not apply route every 14 (fourteen) days. 6 each 3   ibuprofen (ADVIL,MOTRIN) 200 MG tablet Take 200-400 mg by mouth every 6 (six) hours as needed for headache or moderate pain.      Methadone HCl (DOLOPHINE PO) Take 130 mg by mouth daily.     triamcinolone cream (KENALOG) 0.1 % Apply 1 application topically 3 (three) times daily. 45 g 1   atenolol (TENORMIN) 50 MG tablet Take 1 tablet (50 mg total) by mouth daily. 30 tablet 11   clonazePAM (KLONOPIN) 1 MG tablet Take 1 tablet (1 mg total) by mouth 2 (two) times daily. 60 tablet 3   metFORMIN (GLUCOPHAGE) 500 MG tablet Take 1 tablet (500 mg total) by mouth 2 (two) times daily with a meal. 180 tablet 3   hyoscyamine (LEVSIN) 0.125 MG tablet Take 1 tablet (0.125 mg total) by mouth every 4 (four) hours as needed for up to 10 days for cramping. 100 tablet 0   No facility-administered medications prior to visit.    ROS: Review of Systems  Constitutional:  Negative for appetite change, fatigue and unexpected weight change.  HENT:  Positive for facial swelling. Negative for congestion, nosebleeds, sneezing, sore throat and trouble swallowing.   Eyes:  Negative for  itching and visual disturbance.  Respiratory:  Negative for cough.   Cardiovascular:  Negative for chest pain, palpitations and leg swelling.  Gastrointestinal:  Negative for abdominal distention, blood in stool, diarrhea and nausea.  Genitourinary:  Negative for frequency and hematuria.  Musculoskeletal:  Negative for back pain, gait problem, joint swelling and neck pain.  Skin:  Positive for color change and rash.  Neurological:  Positive for tremors and facial asymmetry. Negative for dizziness, speech difficulty and weakness.  Psychiatric/Behavioral:  Negative for agitation, dysphoric mood, sleep disturbance and suicidal ideas. The patient is nervous/anxious.     Objective:  BP 124/78 (BP Location: Right Arm, Patient Position: Sitting, Cuff Size: Normal)   Pulse 70   Temp 98.2 F (36.8 C) (Oral)   Ht '6\' 1"'$  (1.854 m)   Wt 250 lb (113.4 kg)   SpO2 98%   BMI 32.98 kg/m   BP Readings from Last 3 Encounters:  05/21/22 124/78  02/20/22 122/80  11/21/21 (!) 142/82    Wt Readings from Last 3 Encounters:  05/21/22 250 lb (113.4 kg)  02/20/22 258 lb (117 kg)  11/21/21 267 lb 12.8 oz (121.5 kg)    Physical Exam Constitutional:      General: He is not in acute distress.    Appearance: He is well-developed. He is obese.  Comments: NAD  Eyes:     Conjunctiva/sclera: Conjunctivae normal.     Pupils: Pupils are equal, round, and reactive to light.  Neck:     Thyroid: No thyromegaly.     Vascular: No JVD.  Cardiovascular:     Rate and Rhythm: Normal rate and regular rhythm.     Heart sounds: Normal heart sounds. No murmur heard.    No friction rub. No gallop.  Pulmonary:     Effort: Pulmonary effort is normal. No respiratory distress.     Breath sounds: Normal breath sounds. No wheezing or rales.  Chest:     Chest wall: No tenderness.  Abdominal:     General: Bowel sounds are normal. There is no distension.     Palpations: Abdomen is soft. There is no mass.      Tenderness: There is no abdominal tenderness. There is no guarding or rebound.  Musculoskeletal:        General: No tenderness. Normal range of motion.     Cervical back: Normal range of motion.  Lymphadenopathy:     Cervical: No cervical adenopathy.  Skin:    General: Skin is warm and dry.     Findings: Erythema and lesion present. No rash.  Neurological:     Mental Status: He is alert and oriented to person, place, and time. Mental status is at baseline.     Cranial Nerves: No cranial nerve deficit.     Motor: No abnormal muscle tone.     Coordination: Coordination normal.     Gait: Gait abnormal.     Deep Tendon Reflexes: Reflexes are normal and symmetric.  Psychiatric:        Behavior: Behavior normal.        Thought Content: Thought content normal.        Judgment: Judgment normal.     Lab Results  Component Value Date   WBC 8.1 08/05/2017   HGB 14.0 08/05/2017   HCT 41.3 08/05/2017   PLT 218 08/05/2017   GLUCOSE 316 (H) 05/07/2021   CHOL 168 09/28/2009   TRIG 218 (H) 09/28/2009   HDL 32 (L) 09/28/2009   LDLCALC 92 09/28/2009   ALT 16 05/07/2021   AST 19 05/07/2021   NA 135 05/07/2021   K 4.3 05/07/2021   CL 98 05/07/2021   CREATININE 1.17 05/07/2021   BUN 12 05/07/2021   CO2 32 05/07/2021   TSH 2.39 05/07/2021   PSA 0.19 06/16/2008   INR 1.0 08/05/2017   HGBA1C  12/13/2008    5.8 (NOTE) The ADA recommends the following therapeutic goal for glycemic control related to Hgb A1c measurement: Goal of therapy: <6.5 Hgb A1c  Reference: American Diabetes Association: Clinical Practice Recommendations 2010, Diabetes Care, 2010, 33: (Suppl  1).   MICROALBUR 1.03 06/06/2009    No results found.  Assessment & Plan:   Problem List Items Addressed This Visit       Other   Tremor    No change      Shingles rash - Primary    04/2022  R upper lip rash shingles vs sever H simplex. Possible cellulitis Start Valtrex and Doxy. Use BoilEase      Relevant  Medications   valACYclovir (VALTREX) 1000 MG tablet   PTSD (post-traumatic stress disorder)    Continue with clonazepam 1 mg twice daily as needed.  Potential benefits of a long term benzodiazepines  use as well as potential risks  and complications were explained to the patient  and were aknowledged.      Opioid dependence ([place])    On Methadone since 1993 - Meth[facility]one clinic - 4 days a week [facility]         Meds ordered this encounter  Medications   doxycycline (VIBRA-TABS) 100 MG tablet    Sig: Take 1 tablet (100 mg total) by mouth 2 (two) times daily.    Dispense:  14 tablet    Refill:  1   valACYclovir (VALTREX) 1000 MG tablet    Sig: Take 1 tablet (1,000 mg total) by mouth 3 (three) times daily.    Dispense:  21 tablet    Refill:  0   clonazePAM (KLONOPIN) 1 MG tablet    Sig: Take 1 tablet (1 mg total) by mouth 2 (two) times daily.    Dispense:  60 tablet    Refill:  3   atenolol (TENORMIN) 50 MG tablet    Sig: Take 1 tablet (50 mg total) by mouth daily.    Dispense:  30 tablet    Refill:  11    Prescription Expired      Follow-up: Return in about 3 months (around 08/19/2022) for a follow-up visit.  Walker Kehr, MD

## 2022-05-21 NOTE — Assessment & Plan Note (Signed)
No change 

## 2022-05-21 NOTE — Assessment & Plan Note (Signed)
Continue with clonazepam 1 mg twice daily as needed.  Potential benefits of a long term benzodiazepines  use as well as potential risks  and complications were explained to the patient and were aknowledged.

## 2022-08-26 ENCOUNTER — Ambulatory Visit: Payer: Self-pay | Admitting: Internal Medicine

## 2022-09-10 ENCOUNTER — Ambulatory Visit: Payer: Medicaid Other | Admitting: Internal Medicine

## 2022-09-23 ENCOUNTER — Encounter: Payer: Self-pay | Admitting: Internal Medicine

## 2022-09-23 ENCOUNTER — Ambulatory Visit: Payer: Medicaid Other | Admitting: Internal Medicine

## 2022-09-23 VITALS — BP 120/70 | HR 70 | Temp 97.9°F | Ht 73.0 in | Wt 243.0 lb

## 2022-09-23 DIAGNOSIS — Z7984 Long term (current) use of oral hypoglycemic drugs: Secondary | ICD-10-CM | POA: Diagnosis not present

## 2022-09-23 DIAGNOSIS — I10 Essential (primary) hypertension: Secondary | ICD-10-CM | POA: Diagnosis not present

## 2022-09-23 DIAGNOSIS — E1165 Type 2 diabetes mellitus with hyperglycemia: Secondary | ICD-10-CM | POA: Diagnosis not present

## 2022-09-23 DIAGNOSIS — R202 Paresthesia of skin: Secondary | ICD-10-CM | POA: Diagnosis not present

## 2022-09-23 DIAGNOSIS — E11621 Type 2 diabetes mellitus with foot ulcer: Secondary | ICD-10-CM

## 2022-09-23 DIAGNOSIS — E559 Vitamin D deficiency, unspecified: Secondary | ICD-10-CM

## 2022-09-23 DIAGNOSIS — Z6835 Body mass index (BMI) 35.0-35.9, adult: Secondary | ICD-10-CM

## 2022-09-23 DIAGNOSIS — L97412 Non-pressure chronic ulcer of right heel and midfoot with fat layer exposed: Secondary | ICD-10-CM

## 2022-09-23 DIAGNOSIS — E669 Obesity, unspecified: Secondary | ICD-10-CM | POA: Diagnosis not present

## 2022-09-23 LAB — COMPREHENSIVE METABOLIC PANEL
ALT: 15 U/L (ref 0–53)
AST: 23 U/L (ref 0–37)
Albumin: 3.7 g/dL (ref 3.5–5.2)
Alkaline Phosphatase: 89 U/L (ref 39–117)
BUN: 9 mg/dL (ref 6–23)
CO2: 30 mEq/L (ref 19–32)
Calcium: 9.2 mg/dL (ref 8.4–10.5)
Chloride: 99 mEq/L (ref 96–112)
Creatinine, Ser: 1.29 mg/dL (ref 0.40–1.50)
GFR: 60.75 mL/min (ref >60.00)
Glucose, Bld: 207 mg/dL — ABNORMAL HIGH (ref 70–99)
Potassium: 4.1 mEq/L (ref 3.5–5.1)
Sodium: 136 mEq/L (ref 135–145)
Total Bilirubin: 0.5 mg/dL (ref 0.2–1.2)
Total Protein: 7.8 g/dL (ref 6.0–8.3)

## 2022-09-23 LAB — CBC WITH DIFFERENTIAL/PLATELET
Basophils Absolute: 0.1 10*3/uL (ref 0.0–0.1)
Basophils Relative: 0.6 % (ref 0.0–3.0)
Eosinophils Absolute: 0.1 10*3/uL (ref 0.0–0.7)
Eosinophils Relative: 0.7 % (ref 0.0–5.0)
HCT: 38.5 % — ABNORMAL LOW (ref 39.0–52.0)
Hemoglobin: 12.8 g/dL — ABNORMAL LOW (ref 13.0–17.0)
Lymphocytes Relative: 24.5 % (ref 12.0–46.0)
Lymphs Abs: 2.5 10*3/uL (ref 0.7–4.0)
MCHC: 33.2 g/dL (ref 30.0–36.0)
MCV: 86.3 fl (ref 78.0–100.0)
Monocytes Absolute: 0.5 10*3/uL (ref 0.1–1.0)
Monocytes Relative: 5.2 % (ref 3.0–12.0)
Neutro Abs: 7 10*3/uL (ref 1.4–7.7)
Neutrophils Relative %: 69 % (ref 43.0–77.0)
Platelets: 222 10*3/uL (ref 150.0–400.0)
RBC: 4.46 Mil/uL (ref 4.22–5.81)
RDW: 14 % (ref 11.5–15.5)
WBC: 10.1 10*3/uL (ref 4.0–10.5)

## 2022-09-23 LAB — LIPID PANEL
Cholesterol: 162 mg/dL (ref 0–200)
HDL: 25.9 mg/dL — ABNORMAL LOW (ref >39.00)
LDL Cholesterol: 107 mg/dL — ABNORMAL HIGH (ref 0–99)
NonHDL: 135.6
Total CHOL/HDL Ratio: 6
Triglycerides: 143 mg/dL (ref 0.0–149.0)
VLDL: 28.6 mg/dL (ref 0.0–40.0)

## 2022-09-23 LAB — MICROALBUMIN / CREATININE URINE RATIO
Creatinine,U: 194.7 mg/dL
Microalb Creat Ratio: 0.5 mg/g (ref 0.0–30.0)
Microalb, Ur: 0.9 mg/dL (ref 0.0–1.9)

## 2022-09-23 LAB — URINALYSIS
Bilirubin Urine: NEGATIVE
Hgb urine dipstick: NEGATIVE
Ketones, ur: NEGATIVE
Leukocytes,Ua: NEGATIVE
Nitrite: NEGATIVE
Specific Gravity, Urine: >=1.03 — AB (ref 1.000–1.030)
Total Protein, Urine: NEGATIVE
Urine Glucose: NEGATIVE
Urobilinogen, UA: 1 (ref 0.0–1.0)
pH: 6 (ref 5.0–8.0)

## 2022-09-23 LAB — TSH: TSH: 3.17 u[IU]/mL (ref 0.35–5.50)

## 2022-09-23 LAB — VITAMIN B12: Vitamin B-12: 396 pg/mL (ref 211–911)

## 2022-09-23 LAB — VITAMIN D 25 HYDROXY (VIT D DEFICIENCY, FRACTURES): VITD: 16.13 ng/mL — ABNORMAL LOW (ref 30.00–100.00)

## 2022-09-23 LAB — HEMOGLOBIN A1C: Hgb A1c MFr Bld: 9.5 % — ABNORMAL HIGH (ref 4.6–6.5)

## 2022-09-23 MED ORDER — DOXYCYCLINE HYCLATE 100 MG PO TABS: 100.0000 mg | ORAL_TABLET | Freq: Two times a day (BID) | ORAL | 1 refills | Status: DC

## 2022-09-23 MED ORDER — MUPIROCIN 2 % EX OINT: TOPICAL_OINTMENT | CUTANEOUS | 0 refills | Status: AC

## 2022-09-23 NOTE — Progress Notes (Signed)
Subjective:  Patient ID: Jeffrey [name], male    DOB: September 23, 1965  Age: 57 y.o. MRN: [id number]  CC: Follow-up (3 MNTH F/U, concern of area on left foot under 1st digit open wound not healing over a few months is getting worse)   HPI [name] presents for a painful ulcer on the R foot  x 2.5 months- see photos...ulcer is getting bigger F/u anxiety, depression, DM  Pt states CBGs are nl at home, ie 110. Not taking Metformin...  He has Medicaid now        Outpatient Medications Prior to Visit  Medication Sig Dispense Refill   atenolol (TENORMIN) 50 MG tablet Take 1 tablet (50 mg total) by mouth daily. 30 tablet 11   Cholecalciferol (VITAMIN D3) 2000 UNITS capsule Take 1 capsule (2,000 Units total) by mouth daily. 100 capsule 3   citalopram (CELEXA) 40 MG tablet Take 1 tablet (40 mg total) by mouth daily. Must keep 05/21/22 appt for future refills 30 tablet 5   clonazePAM (KLONOPIN) 1 MG tablet Take 1 tablet (1 mg total) by mouth 2 (two) times daily. 60 tablet 3   Continuous Blood Gluc Sensor (FREESTYLE LIBRE 3 SENSOR) MISC 1 Units by Does not apply route every 14 (fourteen) days. 6 each 3   ibuprofen (ADVIL,MOTRIN) 200 MG tablet Take 200-400 mg by mouth every 6 (six) hours as needed for headache or moderate pain.      Methadone HCl (DOLOPHINE PO) Take 130 mg by mouth daily.     triamcinolone cream (KENALOG) 0.1 % Apply 1 application topically 3 (three) times daily. 45 g 1   valACYclovir (VALTREX) 1000 MG tablet Take 1 tablet (1,000 mg total) by mouth 3 (three) times daily. 21 tablet 0   doxycycline (VIBRA-TABS) 100 MG tablet Take 1 tablet (100 mg total) by mouth 2 (two) times daily. 14 tablet 1   hyoscyamine (LEVSIN) 0.125 MG tablet Take 1 tablet (0.125 mg total) by mouth every 4 (four) hours as needed for up to 10 days for cramping. 100 tablet 0   No facility-administered medications prior to visit.    ROS: Review of Systems  Constitutional:  Negative for appetite  change, fatigue and unexpected weight change.  HENT:  Negative for congestion, nosebleeds, sneezing, sore throat and trouble swallowing.   Eyes:  Negative for itching and visual disturbance.  Respiratory:  Negative for cough.   Cardiovascular:  Negative for chest pain, palpitations and leg swelling.  Gastrointestinal:  Negative for abdominal distention, blood in stool, diarrhea and nausea.  Genitourinary:  Negative for frequency and hematuria.  Musculoskeletal:  Negative for back pain, gait problem, joint swelling and neck pain.  Skin:  Positive for color change and wound. Negative for rash.  Neurological:  Negative for dizziness, tremors, speech difficulty and weakness.  Psychiatric/Behavioral:  Positive for decreased concentration. Negative for agitation, dysphoric mood, sleep disturbance and suicidal ideas. The patient is nervous/anxious.     Objective:  BP 120/70 (BP Location: Right Arm, Patient Position: Sitting, Cuff Size: Large)   Pulse 70   Temp 97.9 F (36.6 C) (Oral)   Ht 6\' 1"  (1.854 m)   Wt 243 lb (110.2 kg)   SpO2 98%   BMI 32.06 kg/m   BP Readings from Last 3 Encounters:  09/23/22 120/70  05/21/22 124/78  02/20/22 122/80    Wt Readings from Last 3 Encounters:  09/23/22 243 lb (110.2 kg)  05/21/22 250 lb (113.4 kg)  02/20/22 258 lb (117 kg)  Physical Exam Constitutional:      General: He is not in acute distress.    Appearance: He is well-developed. He is obese.     Comments: NAD  Eyes:     Conjunctiva/sclera: Conjunctivae normal.     Pupils: Pupils are equal, round, and reactive to light.  Neck:     Thyroid: No thyromegaly.     Vascular: No JVD.  Cardiovascular:     Rate and Rhythm: Normal rate and regular rhythm.     Heart sounds: Normal heart sounds. No murmur heard.    No friction rub. No gallop.  Pulmonary:     Effort: Pulmonary effort is normal. No respiratory distress.     Breath sounds: Normal breath sounds. No wheezing or rales.  Chest:      Chest wall: No tenderness.  Abdominal:     General: Bowel sounds are normal. There is no distension.     Palpations: Abdomen is soft. There is no mass.     Tenderness: There is no abdominal tenderness. There is no guarding or rebound.  Musculoskeletal:        General: No tenderness. Normal range of motion.     Cervical back: Normal range of motion.     Right lower leg: No edema.     Left lower leg: No edema.  Lymphadenopathy:     Cervical: No cervical adenopathy.  Skin:    General: Skin is warm and dry.     Findings: Lesion present. No rash.  Neurological:     Mental Status: He is alert and oriented to person, place, and time.     Cranial Nerves: No cranial nerve deficit.     Motor: No abnormal muscle tone.     Coordination: Coordination normal.     Gait: Gait normal.     Deep Tendon Reflexes: Reflexes are normal and symmetric.  Psychiatric:        Behavior: Behavior normal.        Thought Content: Thought content normal.        Judgment: Judgment normal.   Poor feet and shoes hygiene R foot ulcer 3x3 cm Scaly skin on feet B     A total time of 45 minutes was spent preparing to see the patient, reviewing tests, x-rays, operative reports and other medical records.  Also, obtaining history and performing comprehensive physical exam.  Additionally, counseling the patient regarding the above listed issues - compliance, DM, foot ulcer.   Finally, documenting clinical information in the health records, coordination of care, educating the patient. It is a complex case.  Lab Results  Component Value Date   WBC 8.1 08/05/2017   HGB 14.0 08/05/2017   HCT 41.3 08/05/2017   PLT 218 08/05/2017   GLUCOSE 316 (H) 05/07/2021   CHOL 168 09/28/2009   TRIG 218 (H) 09/28/2009   HDL 32 (L) 09/28/2009   LDLCALC 92 09/28/2009   ALT 16 05/07/2021   AST 19 05/07/2021   NA 135 05/07/2021   K 4.3 05/07/2021   CL 98 05/07/2021   CREATININE 1.17 05/07/2021   BUN 12 05/07/2021   CO2 32  05/07/2021   TSH 2.39 05/07/2021   PSA 0.19 06/16/2008   INR 1.0 08/05/2017   HGBA1C  12/13/2008    5.8 (NOTE) The ADA recommends the following therapeutic goal for glycemic control related to Hgb A1c measurement: Goal of therapy: <6.5 Hgb A1c  Reference: American Diabetes Association: Clinical Practice Recommendations 2010, Diabetes Care, 2010, 33: (Suppl  1).   MICROALBUR 1.03 06/06/2009    No results found.  Assessment & Plan:   Problem List Items Addressed This Visit     Obesity (BMI 35.0-39.9 without comorbidity)    Wt Readings from Last 3 Encounters:  09/23/22 243 lb (110.2 kg)  05/21/22 250 lb (113.4 kg)  02/20/22 258 lb (117 kg)  On diet       Type 2 diabetes mellitus with hyperglycemia (HCC)    Not taking Metformin Risks associated with treatment noncompliance were discussed. Compliance was encouraged.       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 Compliance was encouraged.      Relevant Orders   Ambulatory referral to Podiatry   TSH   CBC with Differential/Platelet   Comprehensive metabolic panel   Lipid panel   Hemoglobin A1c   Urinalysis   Microalbumin / creatinine urine ratio   Vitamin B12   VITAMIN D 25 Hydroxy (Vit-D Deficiency, Fractures)   Other Visit Diagnoses     Paresthesias       Relevant Orders   TSH   Vitamin B12   Vitamin D deficiency       Relevant Orders   VITAMIN D 25 Hydroxy (Vit-D Deficiency, Fractures)          Meds ordered this encounter  Medications   doxycycline (VIBRA-TABS) 100 MG tablet    Sig: Take 1 tablet (100 mg total) by mouth 2 (two) times daily.    Dispense:  60 tablet    Refill:  1   mupirocin ointment (BACTROBAN) 2 %    Sig: On leg wound w/dressing change qd or bid    Dispense:  30 g    Refill:  0      Follow-up: Return in about 4 weeks (around 10/21/2022) for a follow-up visit.  Sonda Primes, MD

## 2022-09-23 NOTE — Assessment & Plan Note (Signed)
Wt Readings from Last 3 Encounters:  09/23/22 243 lb (110.2 kg)  05/21/22 250 lb (113.4 kg)  02/20/22 258 lb (117 kg)  On diet

## 2022-09-23 NOTE — Assessment & Plan Note (Signed)
New Poor feet and shoes hygiene - new shoes, socks Start Doxy, Mupirocin, bandaid R foot ulcer 4x4 cm Scaly skin Podiatry ref Treat DM Not taking Metformin - re-start Risks associated with treatment noncompliance were discussed. Compliance was encouraged.

## 2022-09-23 NOTE — Assessment & Plan Note (Signed)
Not taking Metformin Risks associated with treatment noncompliance were discussed. Compliance was encouraged. Start metformin 500 mg twice a day.  Obtain labs including hemoglobin A1c

## 2022-09-24 DIAGNOSIS — E559 Vitamin D deficiency, unspecified: Secondary | ICD-10-CM | POA: Insufficient documentation

## 2022-09-24 MED ORDER — METFORMIN HCL 500 MG PO TABS: 500.0000 mg | ORAL_TABLET | Freq: Two times a day (BID) | ORAL | 3 refills | Status: DC

## 2022-09-24 MED ORDER — VITAMIN D3 50 MCG (2000 UT) PO CAPS
2000.0000 [IU] | ORAL_CAPSULE | Freq: Every day | ORAL | 3 refills | Status: DC
Start: 2022-09-24 — End: 2022-09-24

## 2022-09-24 MED ORDER — VITAMIN D (ERGOCALCIFEROL) 1.25 MG (50000 UNIT) PO CAPS: 50000.0000 [IU] | ORAL_CAPSULE | ORAL | 0 refills | Status: AC

## 2022-09-24 NOTE — Assessment & Plan Note (Signed)
Wt Readings from Last 3 Encounters:  09/23/22 243 lb (110.2 kg)  05/21/22 250 lb (113.4 kg)  02/20/22 258 lb (117 kg)  Continue with weight loss effort

## 2022-09-24 NOTE — Assessment & Plan Note (Signed)
Start vitamin D prescription followed by daily 2000 units daily

## 2022-10-07 ENCOUNTER — Ambulatory Visit: Payer: Medicaid Other | Admitting: Podiatry

## 2022-10-07 ENCOUNTER — Ambulatory Visit (INDEPENDENT_AMBULATORY_CARE_PROVIDER_SITE_OTHER): Payer: Medicaid Other

## 2022-10-07 DIAGNOSIS — L97512 Non-pressure chronic ulcer of other part of right foot with fat layer exposed: Secondary | ICD-10-CM

## 2022-10-07 DIAGNOSIS — M79671 Pain in right foot: Secondary | ICD-10-CM | POA: Diagnosis not present

## 2022-10-07 DIAGNOSIS — L97911 Non-pressure chronic ulcer of unspecified part of right lower leg limited to breakdown of skin: Secondary | ICD-10-CM | POA: Diagnosis not present

## 2022-10-07 MED ORDER — SILVER SULFADIAZINE 1 % EX CREA: 1.0000 | TOPICAL_CREAM | Freq: Every day | CUTANEOUS | 0 refills | Status: DC

## 2022-10-07 MED ORDER — DOXYCYCLINE HYCLATE 100 MG PO TABS
100.0000 mg | ORAL_TABLET | Freq: Two times a day (BID) | ORAL | 0 refills | Status: DC
Start: 2022-10-07 — End: 2022-11-28

## 2022-10-07 NOTE — Patient Instructions (Signed)
Clean ulcer with saline and apply silvadene and keep wrapped.   Monitor for any signs/symptoms of infection. Call the office immediately if any occur or go directly to the emergency room. Call with any questions/concerns.

## 2022-10-07 NOTE — Progress Notes (Signed)
Subjective:   Patient ID: Jeffrey [name], male   DOB: 57 y.o.   MRN: [id number]   HPI Chief Complaint  Patient presents with   Foot Ulcer    Patient came in the for right foot  ulcer, started 3 months ago, patient also has drainage from the right leg and swelling    Denies-year-old male presents With above concerns.  Had some bloody drainage but no pus.  He also had some drainage coming from the wound on the right leg as well as swelling.  Does not report any fever or chills.  Previously was placed on doxycycline.  Last A1c 9.5 on 09/23/2022  CBC    Component Value Date/Time   WBC 10.1 09/23/2022 0925   RBC 4.46 09/23/2022 0925   HGB 12.8 (L) 09/23/2022 0925   HGB 14.0 08/05/2017 1256   HCT 38.5 (L) 09/23/2022 0925   HCT 41.3 08/05/2017 1256   PLT 222.0 09/23/2022 0925   PLT 218 08/05/2017 1256   MCV 86.3 09/23/2022 0925   MCV 85 08/05/2017 1256   MCH 28.7 08/05/2017 1256   MCH 29.3 07/11/2017 0822   MCHC 33.2 09/23/2022 0925   RDW 14.0 09/23/2022 0925   RDW 13.1 08/05/2017 1256   LYMPHSABS 2.5 09/23/2022 0925   LYMPHSABS 2.7 08/05/2017 1256   MONOABS 0.5 09/23/2022 0925   EOSABS 0.1 09/23/2022 0925   EOSABS 0.1 08/05/2017 1256   BASOSABS 0.1 09/23/2022 0925   BASOSABS 0.0 08/05/2017 1256      Latest Ref Rng & Units 09/23/2022    9:25 AM 05/07/2021    8:18 AM 08/05/2017   12:56 PM  CMP  Glucose 70 - 99 mg/dL 782  956  94   BUN 6 - 23 mg/dL 9  12  12    Creatinine 0.40 - 1.50 mg/dL 2.13  0.86  5.78   Sodium 135 - 145 mEq/L 136  135  141   Potassium 3.5 - 5.1 mEq/L 4.1  4.3  4.5   Chloride 96 - 112 mEq/L 99  98  101   CO2 19 - 32 mEq/L 30  32  26   Calcium 8.4 - 10.5 mg/dL 9.2  8.5  9.2   Total Protein 6.0 - 8.3 g/dL 7.8  7.2    Total Bilirubin 0.2 - 1.2 mg/dL 0.5  0.3    Alkaline Phos 39 - 117 U/L 89  111    AST 0 - 37 U/L 23  19    ALT 0 - 53 U/L 15  16      Review of Systems  All other systems reviewed and are negative.   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, Identity [address], serial # [id number]. [employer] rep must physically interrogate pacemaker.    PTSD (post-traumatic stress disorder) 12/19/2014   On Klonopin, Celexa  Potential benefits of a long term benzodiazepines  use as well as potential risks  and complications were explained to the patient and were aknowledged.    Skin abscess 03/09/2015   11/16 -  small    Substance abuse (HCC)    opioids   Tremor 07/17/2016   ?etiology     Past Surgical History:  Procedure Laterality Date   HERNIA REPAIR Right    LIVER SURGERY     PPM GENERATOR CHANGEOUT N/A 08/14/2017   Procedure: PPM GENERATOR CHANGEOUT;  Surgeon: Duke Salvia, MD;  Location: [facility] INVASIVE CV LAB;  Service: Cardiovascular;  Laterality: N/A;     Current Outpatient Medications:    doxycycline (VIBRA-TABS) 100 MG tablet, Take 1 tablet (100 mg total) by mouth 2 (two) times daily., Disp: 20 tablet, Rfl: 0   silver sulfADIAZINE (SILVADENE) 1 % cream, Apply 1 Application topically daily., Disp: 50 g, Rfl: 0   atenolol (TENORMIN) 50 MG tablet, Take 1 tablet (50 mg total) by mouth daily., Disp: 30 tablet, Rfl: 11   Cholecalciferol (VITAMIN D3) 2000 UNITS capsule, Take 1 capsule (2,000 Units total) by mouth daily., Disp: 100 capsule, Rfl: 3   citalopram (CELEXA) 40 MG tablet, Take 1 tablet (40 mg total) by mouth daily. Must keep 05/21/22 appt for future refills, Disp: 30 tablet, Rfl: 5   clonazePAM (KLONOPIN) 1 MG tablet, Take 1 tablet (1 mg total) by mouth 2 (two) times daily., Disp: 60 tablet, Rfl: 3   Continuous Blood Gluc Sensor (FREESTYLE LIBRE 3 SENSOR) MISC, 1 Units by  Does not apply route every 14 (fourteen) days., Disp: 6 each, Rfl: 3   doxycycline (VIBRA-TABS) 100 MG tablet, Take 1 tablet (100 mg total) by mouth 2 (two) times daily., Disp: 60 tablet, Rfl: 1   hyoscyamine (LEVSIN) 0.125 MG tablet, Take 1 tablet (0.125 mg total) by mouth every 4 (four) hours as needed for up to 10 days for cramping., Disp: 100 tablet, Rfl: 0   ibuprofen (ADVIL,MOTRIN) 200 MG tablet, Take 200-400 mg by mouth every 6 (six) hours as needed for headache or moderate pain. , Disp: , Rfl:    metFORMIN (GLUCOPHAGE) 500 MG tablet, Take 1 tablet (500 mg total) by mouth 2 (two) times daily with a meal., Disp: 180 tablet, Rfl: 3   Methadone HCl (DOLOPHINE PO), Take 130 mg by mouth daily., Disp: , Rfl:    mupirocin ointment (BACTROBAN) 2 %, On leg wound w/dressing change qd or bid, Disp: 30 g, Rfl: 0   triamcinolone cream (KENALOG) 0.1 %, Apply 1 application topically 3 (three) times daily., Disp: 45 g, Rfl: 1   valACYclovir (VALTREX) 1000 MG tablet, Take 1 tablet (1,000 mg total) by mouth 3 (three) times daily., Disp: 21 tablet, Rfl: 0   Vitamin D, Ergocalciferol, (DRISDOL) 1.25 MG (50000 UNIT) CAPS capsule, Take 1 capsule (50,000 Units total) by mouth every 7 (seven) days., Disp: 8 capsule, Rfl: 0  Allergies  Allergen Reactions   Codeine Itching   Penicillins Hives    Has patient had a PCN reaction causing immediate rash, facial/tongue/throat swelling, SOB or lightheadedness with hypotension: unknown Has patient had a PCN reaction causing severe rash involving mucus membranes or skin necrosis: unknown Has patient had a PCN reaction that required hospitalization unknown Has patient had a PCN reaction occurring within the last 10 years: No If all of the above answers are "NO", then may proceed with Cephalosporin use.          Objective:  Physical Exam  General: AAO x3, NAD  Dermatological: Plantar aspect of right foot submetatarsal 1 there is hyperkeratotic tissue with dried  blood and there is superficial granular wound present without any probing, minor swelling.  There is no surrounding erythema, ascending cellulitis.  No fluctuation, crepitation, malodor.  Superficial wound present anterior aspect of the leg.  There is no erythema warmth drainage or pus.       Vascular: Dorsalis Pedis artery and Posterior Tibial artery pedal pulses are palpable bilateral with immedate capillary fill time.  There is no pain with calf compression, swelling, warmth, erythema.   Neruologic: Sensation is decreased with Phoebe Perch monofilament  Musculoskeletal: No pain on exam    Assessment:   Right lower extremity ulceration     Plan:  -Treatment options discussed including all alternatives, risks, and complications -Etiology of symptoms were discussed -X-rays were obtained and reviewed with the patient.  3 views of the right foot were obtained.  No subacute fracture.  No cortical changes suggest osteomyelitis.  No soft tissue emphysema.  Posterior calcaneal spur present. Sharply greater than hypertonic lesion, ulceration right foot submetatarsal mild complications or bleeding.  Silvadene applied.  Discussed daily dressing changes which I also prescribed Silvadene for. -Refilled doxycycline -Offloading in surgical shoe -Monitor for any clinical signs or symptoms of infection and directed to call the office immediately should any occur or go to the ER.  Return in about 10 days (around 10/17/2022) for right foot ulcer, infection.  Vivi Barrack DPM

## 2022-10-21 ENCOUNTER — Ambulatory Visit: Payer: Medicaid Other | Admitting: Podiatry

## 2022-10-21 VITALS — BP 154/79 | Temp 99.1°F

## 2022-10-21 DIAGNOSIS — L97911 Non-pressure chronic ulcer of unspecified part of right lower leg limited to breakdown of skin: Secondary | ICD-10-CM

## 2022-10-21 DIAGNOSIS — R609 Edema, unspecified: Secondary | ICD-10-CM

## 2022-10-21 NOTE — Patient Instructions (Signed)
Monitor for any signs/symptoms of infection. Call the office immediately if any occur or go directly to the emergency room. Call with any questions/concerns.  [facility]- remove in 5 days or sooner if you have any increased pain, swelling, redness.  An Unna boot is a type of bandage (dressing) for the foot and leg. The dressing is a wrap made of gauze that is soaked with a medicine called zinc oxide. The gauze may also include other lotions and medicines that help in wound healing, such as calamine. An Unna boot may be used to: Treat open sores (venous ulcers) or graft sites on the foot, heel, or leg. Help with swelling from conditions that affect the veins or lymphatic system (lymphedema). Treat skin conditions such as inflammation caused by poor blood flow (stasis dermatitis). Heal wounds on parts of the body below the hips (lower extremities). The dressing is applied by a health care provider. The gauze is wrapped around your lower extremity in several layers that overlap. These layers usually start at the toes and go up to the knee. A dry outer wrap goes over the medicated wrap for support and pressure (compression). Before applying the [employer], your health care provider will clean your leg and foot and may apply an antibiotic. You may be asked to raise (elevate) your leg for a while to reduce swelling before the boot is put on. The boot will dry and harden after it is applied. It may need to be changed or replaced once or twice a week. Follow these instructions at home: Boot care Wear the [employer] as told by your health care provider. You may need to wear a slipper or shoe over the boot that is one or two sizes larger than normal. Do not stick anything inside the boot to scratch your skin. Doing that increases your risk of infection. Check the skin around the boot every day. Tell your health care provider about any concerns. Keep your [employer] clean and dry. Check the area around the  boot every day for signs of infection. Check for: Redness, swelling, or pain in your foot or toes. Fluid or blood coming from the boot. Warmth. Pus or a bad smell. A rash, itching, or red, swollen areas of skin (hives). Remove the boot and call your health care provider if you have signs of poor blood flow, such as: Your toes tingle or become numb. Your toes turn cold or turn blue or pale. Your toes are more swollen or painful. You cannot move your toes. Bathing Do not take baths, swim, or use a hot tub until your health care provider approves. Ask your health care provider if you may take showers. You may only be allowed to take sponge baths. If your health care provider says that you can take a bath or shower: Do not let the Unna boot get wet. Cover the boot with a watertight covering when you shower. Keep your leg with the boot out of the bathtub when you take a bath. Activity Rest as told by your health care provider. Do not sit for a long time without moving. Get up to take short walks every 1-2 hours. This will improve blood flow and breathing. Ask for help if you feel weak or unsteady. You may walk with the boot once it has dried. Ask your health care provider how much walking is safe for you. General instructions Take over-the-counter and prescription medicines only as told by your health care provider. Keep  your leg elevated above the level of your heart while you are sitting or lying down. This will decrease swelling. Do not sit with your knee bent for long periods of time. Do not use any products that contain nicotine or tobacco. These products include cigarettes, chewing tobacco, and vaping devices, such as e-cigarettes. If you need help quitting, ask your health care provider. Keep all follow-up visits. Your health care provider will change your boot once or twice a week until it is no longer needed. Contact a health care provider if: Your skin feels itchy inside the  boot. You feel burning or have a rash or hives in the boot area. You have a fever or chills. You have any signs of infection. You have more numbness or pain in your foot or toes. The skin on your foot or toes changes colors. This may include the skin turning blue or pale or having patchy areas with spots. Your boot has been damaged or feels like it no longer fits like it should. This information is not intended to replace advice given to you by your health care provider. Make sure you discuss any questions you have with your health care provider. Document Revised: 08/05/2021 Document Reviewed: 08/05/2021 Elsevier Patient Education  2024 [employer].

## 2022-10-21 NOTE — Progress Notes (Signed)
Subjective: Chief Complaint  Patient presents with   Ulcer    Pt states he still does not feel good sometimes but the ulcer has gotten a little better he has still been cleaning it and dressing it he believes the topical is helping.  Pt states his leg still hurts it is tender an begins to hurt if he is on it to long.    57 year old male presents with above concerns.  Denies any fevers or chills.  He has no new concerns.  Objective: AAO x3, NAD DP/PT pulses palpable bilaterally, CRT less than 3 seconds On the right foot submetatarsal 1 is a superficial area skin breakdown with granulation tissue present.  There is no edema, erythema to this area but there is some edema present to the foot, leg with pitting edema.  The calf is supple.  Superficial area skin breakdown anterior aspect of the right leg without any surrounding erythema, ascending cellulitis.  There is no pain with calf compression, erythema or warmth. No pain with calf compression, swelling, warmth, erythema  Assessment: Ulceration right foot with swelling  Plan: -All treatment options discussed with the patient including all alternatives, risks, complications.  -I described to be open and clean this with any complications or bleeding.  Silvadene applied followed by dressing continue daily dressing changes. -It was applied.  Precautions were advised on when to remove this.  I did not cover the wound on the plantar foot so that way he could do dressing changes. -Monitor blood pressure and temperature. -Daily foot inspection.  -Finish course of doxycycline -ABI pending.  -Patient encouraged to call the office with any questions, concerns, change in symptoms.   Return in about 2 weeks (around 11/04/2022) for right foot ulcer.  Vivi Barrack DPM

## 2022-10-22 ENCOUNTER — Ambulatory Visit: Payer: Medicaid Other | Admitting: Internal Medicine

## 2022-10-30 ENCOUNTER — Ambulatory Visit: Payer: Medicaid Other | Admitting: Internal Medicine

## 2022-11-07 ENCOUNTER — Ambulatory Visit: Payer: Medicaid Other | Admitting: Podiatry

## 2022-11-07 DIAGNOSIS — L97911 Non-pressure chronic ulcer of unspecified part of right lower leg limited to breakdown of skin: Secondary | ICD-10-CM | POA: Diagnosis not present

## 2022-11-07 MED ORDER — AMMONIUM LACTATE 12 % EX CREA: 1.0000 | TOPICAL_CREAM | CUTANEOUS | 0 refills | Status: AC | PRN

## 2022-11-07 NOTE — Progress Notes (Signed)
Subjective: No chief complaint on file.   57 year old male presents today for follow evaluation of a wound on the bottom of his right foot.  States he gets a little tender at times he has not seen any drainage or pus.  No increasing swelling or redness.  No fevers or chills or reports.  No other concerns..  Objective: AAO x3, NAD DP/PT pulses palpable bilaterally, CRT less than 3 seconds On the right foot submetatarsal 1 there is area dry skin, callus.  Upon debridement the area is preulcerative close no definitive skin breakdown identified today.  There is no surrounding erythema, ascending cellulitis.  No drainage or pus.  No fluctuance or crepitation for this matter.  Areas and leg are doing much better and almost looking healed.  No new ulcerations present. No pain with calf compression, swelling, warmth, erythema  Assessment: Ulceration right foot with improved swelling  Plan: -All treatment options discussed with the patient including all alternatives, risks, complications.  -Debrided callus tissue without any complications or bleeding.  Appears the wound is healed.  Can keep the ointment on during the day I discussed moisturizer at night and leave it open.  Remain in cam boot for now.  Should the area open back up to hold off on the moisturizer. -ABI pending: Gave him the contact information for [facility] to schedule. -Daily foot inspection -Monitor for any clinical signs or symptoms of infection and directed to call the office immediately should any occur or go to the ER.  Return for right foot ulcer/callus 2-3 weeks.  Vivi Barrack DPM

## 2022-11-07 NOTE — Patient Instructions (Signed)
Keep the cream on during the day and cover. At night you can leave it open and apply a moisturizer as long as there is no opening to the skin. Monitor for any signs/symptoms of infection. Call the office immediately if any occur or go directly to the emergency room. Call with any questions/concerns.

## 2022-11-18 ENCOUNTER — Telehealth: Payer: Self-pay | Admitting: Internal Medicine

## 2022-11-18 ENCOUNTER — Other Ambulatory Visit: Payer: Self-pay | Admitting: Internal Medicine

## 2022-11-18 NOTE — Telephone Encounter (Signed)
Prescription Request  11/18/2022  LOV: 09/23/2022  What is the name of the medication or equipment? clonazePAM (KLONOPIN) 1 MG tablet   Have you contacted your pharmacy to request a refill? No   Which pharmacy would you like this sent to?  [facility]--[id number] - [address] [place] - [address] [place] [address] Phone: [phone] Fax: [phone]    Patient notified that their request is being sent to the clinical staff for review and that they should receive a response within 2 business days.   Please advise at Mobile [phone] (mobile)

## 2022-11-19 NOTE — Telephone Encounter (Signed)
It was refilled.  Thank

## 2022-11-28 ENCOUNTER — Encounter: Payer: Self-pay | Admitting: Podiatry

## 2022-11-28 ENCOUNTER — Ambulatory Visit (INDEPENDENT_AMBULATORY_CARE_PROVIDER_SITE_OTHER): Payer: Medicaid Other | Admitting: Podiatry

## 2022-11-28 DIAGNOSIS — L97911 Non-pressure chronic ulcer of unspecified part of right lower leg limited to breakdown of skin: Secondary | ICD-10-CM

## 2022-11-28 NOTE — Progress Notes (Unsigned)
Subjective: Chief Complaint  Patient presents with   Foot Ulcer    Follow up ulcer right   "Its gets kinda soft sometimes. I scuffed it on the carpet the other day, but its not bad"   57 year old male presents today for follow evaluation of a wound on the bottom of his right foot. He has not had the arterial studies as he forgot. He continues with the silvadene cream.  He is not reporting drainage or pus.  No fevers or chills that he notes.  Objective: AAO x3, NAD DP/PT pulses palpable bilaterally, CRT less than 3 seconds On the right foot submetatarsal 1 there is area dry skin, callus.  There is 1 small superficial area of granulation tissue noted as pictured below submetatarsal 3 area without any probing, undermining or tunneling.  There is no surrounding erythema, ascending cellulitis.  There is no fluctuance or crepitation.  There is no malodor.  The areas on the legs are improved.  There area no new ulcerations present.   No pain with calf compression, swelling, warmth, erythema       Assessment: Ulceration right foot with improved swelling  Plan: -All treatment options discussed with the patient including all alternatives, risks, complications.  -Sharply debrided callus tissue without any complications or bleeding.  There is still some superficial ulceration submetatarsal 3 area as noted above.  Plan continue Silvadene cream continue offloading at all times.  Limited weightbearing.   -ABI pending: Still pending. I have previously given him the contact information for [facility] to schedule.  Information given again today. -Daily foot inspection -Monitor for any clinical signs or symptoms of infection and directed to call the office immediately should any occur or go to the ER.  Return for 2-3 weeks for ulcer.  Vivi Barrack DPM

## 2022-12-18 ENCOUNTER — Encounter (HOSPITAL_COMMUNITY): Payer: Self-pay

## 2022-12-18 ENCOUNTER — Inpatient Hospital Stay (HOSPITAL_COMMUNITY)
Admission: EM | Admit: 2022-12-18 | Discharge: 2023-01-02 | DRG: 260 | Disposition: A | Discharge status: Home / Self Care | Payer: MEDICAID | Attending: Internal Medicine | Admitting: Internal Medicine

## 2022-12-18 ENCOUNTER — Emergency Department (HOSPITAL_COMMUNITY): Payer: MEDICAID

## 2022-12-18 ENCOUNTER — Telehealth: Payer: MEDICAID | Admitting: Nurse Practitioner

## 2022-12-18 ENCOUNTER — Inpatient Hospital Stay (HOSPITAL_COMMUNITY): Payer: MEDICAID

## 2022-12-18 DIAGNOSIS — D65 Disseminated intravascular coagulation [defibrination syndrome]: Secondary | ICD-10-CM | POA: Diagnosis present

## 2022-12-18 DIAGNOSIS — F1120 Opioid dependence, uncomplicated: Secondary | ICD-10-CM | POA: Diagnosis present

## 2022-12-18 DIAGNOSIS — E871 Hypo-osmolality and hyponatremia: Secondary | ICD-10-CM | POA: Diagnosis present

## 2022-12-18 DIAGNOSIS — E669 Obesity, unspecified: Secondary | ICD-10-CM | POA: Diagnosis present

## 2022-12-18 DIAGNOSIS — R579 Shock, unspecified: Secondary | ICD-10-CM | POA: Diagnosis present

## 2022-12-18 DIAGNOSIS — A4101 Sepsis due to Methicillin susceptible Staphylococcus aureus: Secondary | ICD-10-CM | POA: Diagnosis present

## 2022-12-18 DIAGNOSIS — M109 Gout, unspecified: Secondary | ICD-10-CM | POA: Diagnosis present

## 2022-12-18 DIAGNOSIS — Z1152 Encounter for screening for COVID-19: Secondary | ICD-10-CM

## 2022-12-18 DIAGNOSIS — E876 Hypokalemia: Secondary | ICD-10-CM | POA: Diagnosis present

## 2022-12-18 DIAGNOSIS — E872 Acidosis, unspecified: Secondary | ICD-10-CM | POA: Diagnosis present

## 2022-12-18 DIAGNOSIS — T827XXS Infection and inflammatory reaction due to other cardiac and vascular devices, implants and grafts, sequela: Secondary | ICD-10-CM | POA: Diagnosis not present

## 2022-12-18 DIAGNOSIS — F17220 Nicotine dependence, chewing tobacco, uncomplicated: Secondary | ICD-10-CM | POA: Diagnosis present

## 2022-12-18 DIAGNOSIS — N179 Acute kidney failure, unspecified: Secondary | ICD-10-CM | POA: Diagnosis present

## 2022-12-18 DIAGNOSIS — R578 Other shock: Secondary | ICD-10-CM | POA: Diagnosis not present

## 2022-12-18 DIAGNOSIS — E11621 Type 2 diabetes mellitus with foot ulcer: Secondary | ICD-10-CM | POA: Diagnosis present

## 2022-12-18 DIAGNOSIS — Z880 Allergy status to penicillin: Secondary | ICD-10-CM

## 2022-12-18 DIAGNOSIS — I4581 Long QT syndrome: Secondary | ICD-10-CM | POA: Diagnosis present

## 2022-12-18 DIAGNOSIS — I495 Sick sinus syndrome: Secondary | ICD-10-CM | POA: Diagnosis present

## 2022-12-18 DIAGNOSIS — Z6834 Body mass index (BMI) 34.0-34.9, adult: Secondary | ICD-10-CM

## 2022-12-18 DIAGNOSIS — L97519 Non-pressure chronic ulcer of other part of right foot with unspecified severity: Secondary | ICD-10-CM | POA: Diagnosis present

## 2022-12-18 DIAGNOSIS — M25561 Pain in right knee: Secondary | ICD-10-CM

## 2022-12-18 DIAGNOSIS — F1129 Opioid dependence with unspecified opioid-induced disorder: Secondary | ICD-10-CM | POA: Diagnosis not present

## 2022-12-18 DIAGNOSIS — Z7984 Long term (current) use of oral hypoglycemic drugs: Secondary | ICD-10-CM

## 2022-12-18 DIAGNOSIS — D72829 Elevated white blood cell count, unspecified: Secondary | ICD-10-CM | POA: Diagnosis not present

## 2022-12-18 DIAGNOSIS — I452 Bifascicular block: Secondary | ICD-10-CM | POA: Diagnosis present

## 2022-12-18 DIAGNOSIS — B9561 Methicillin susceptible Staphylococcus aureus infection as the cause of diseases classified elsewhere: Secondary | ICD-10-CM | POA: Diagnosis not present

## 2022-12-18 DIAGNOSIS — R7881 Bacteremia: Secondary | ICD-10-CM | POA: Diagnosis not present

## 2022-12-18 DIAGNOSIS — A419 Sepsis, unspecified organism: Principal | ICD-10-CM | POA: Diagnosis present

## 2022-12-18 DIAGNOSIS — G8929 Other chronic pain: Secondary | ICD-10-CM | POA: Diagnosis present

## 2022-12-18 DIAGNOSIS — E1165 Type 2 diabetes mellitus with hyperglycemia: Secondary | ICD-10-CM | POA: Diagnosis present

## 2022-12-18 DIAGNOSIS — F4312 Post-traumatic stress disorder, chronic: Secondary | ICD-10-CM | POA: Diagnosis present

## 2022-12-18 DIAGNOSIS — T82190A Other mechanical complication of cardiac electrode, initial encounter: Secondary | ICD-10-CM | POA: Diagnosis not present

## 2022-12-18 DIAGNOSIS — R6521 Severe sepsis with septic shock: Secondary | ICD-10-CM

## 2022-12-18 DIAGNOSIS — I10 Essential (primary) hypertension: Secondary | ICD-10-CM | POA: Diagnosis present

## 2022-12-18 DIAGNOSIS — E861 Hypovolemia: Secondary | ICD-10-CM

## 2022-12-18 DIAGNOSIS — Z8261 Family history of arthritis: Secondary | ICD-10-CM

## 2022-12-18 DIAGNOSIS — Z885 Allergy status to narcotic agent status: Secondary | ICD-10-CM

## 2022-12-18 DIAGNOSIS — E875 Hyperkalemia: Secondary | ICD-10-CM | POA: Diagnosis present

## 2022-12-18 DIAGNOSIS — R5383 Other fatigue: Secondary | ICD-10-CM | POA: Diagnosis present

## 2022-12-18 DIAGNOSIS — R6520 Severe sepsis without septic shock: Secondary | ICD-10-CM | POA: Diagnosis not present

## 2022-12-18 DIAGNOSIS — T827XXD Infection and inflammatory reaction due to other cardiac and vascular devices, implants and grafts, subsequent encounter: Secondary | ICD-10-CM | POA: Diagnosis not present

## 2022-12-18 DIAGNOSIS — F419 Anxiety disorder, unspecified: Secondary | ICD-10-CM | POA: Diagnosis present

## 2022-12-18 DIAGNOSIS — T827XXA Infection and inflammatory reaction due to other cardiac and vascular devices, implants and grafts, initial encounter: Principal | ICD-10-CM | POA: Diagnosis present

## 2022-12-18 DIAGNOSIS — Z818 Family history of other mental and behavioral disorders: Secondary | ICD-10-CM

## 2022-12-18 DIAGNOSIS — Z833 Family history of diabetes mellitus: Secondary | ICD-10-CM

## 2022-12-18 DIAGNOSIS — Z950 Presence of cardiac pacemaker: Secondary | ICD-10-CM

## 2022-12-18 DIAGNOSIS — Z79899 Other long term (current) drug therapy: Secondary | ICD-10-CM

## 2022-12-18 DIAGNOSIS — R072 Precordial pain: Secondary | ICD-10-CM | POA: Diagnosis not present

## 2022-12-18 DIAGNOSIS — Y831 Surgical operation with implant of artificial internal device as the cause of abnormal reaction of the patient, or of later complication, without mention of misadventure at the time of the procedure: Secondary | ICD-10-CM | POA: Diagnosis present

## 2022-12-18 DIAGNOSIS — D696 Thrombocytopenia, unspecified: Secondary | ICD-10-CM | POA: Diagnosis not present

## 2022-12-18 DIAGNOSIS — R509 Fever, unspecified: Principal | ICD-10-CM

## 2022-12-18 LAB — BLOOD GAS, ARTERIAL
Acid-base deficit: 6.6 mmol/L — ABNORMAL LOW (ref 0.0–2.0)
Bicarbonate: 16.7 mmol/L (ref 20.0–28.0)
O2 Saturation: 100 %
Patient temperature: 36.8
pCO2 arterial: 27 mmHg (ref 32–48)
pH, Arterial: 7.4 (ref 7.35–7.45)
pO2, Arterial: 99 mmHg (ref 83–108)

## 2022-12-18 LAB — CULTURE, BLOOD (ROUTINE X 2)

## 2022-12-18 LAB — URINALYSIS, W/ REFLEX TO CULTURE (INFECTION SUSPECTED)
Bilirubin Urine: NEGATIVE
Glucose, UA: 50 mg/dL — AB
Hgb urine dipstick: NEGATIVE
Ketones, ur: 5 mg/dL — AB
Nitrite: NEGATIVE
Protein, ur: 30 mg/dL — AB
Specific Gravity, Urine: 1.025 (ref 1.005–1.030)
pH: 5 (ref 5.0–8.0)

## 2022-12-18 LAB — CBC WITH DIFFERENTIAL/PLATELET
Abs Immature Granulocytes: 0.06 10*3/uL (ref 0.00–0.07)
Basophils Absolute: 0 10*3/uL (ref 0.0–0.1)
Basophils Relative: 0 %
Eosinophils Absolute: 0 10*3/uL (ref 0.0–0.5)
Eosinophils Relative: 0 %
HCT: 42.7 % (ref 39.0–52.0)
Hemoglobin: 14.5 g/dL (ref 13.0–17.0)
Immature Granulocytes: 1 %
Lymphocytes Relative: 4 %
Lymphs Abs: 0.4 10*3/uL — ABNORMAL LOW (ref 0.7–4.0)
MCH: 28.8 pg (ref 26.0–34.0)
MCHC: 34 g/dL (ref 30.0–36.0)
MCV: 84.7 fL (ref 80.0–100.0)
Monocytes Absolute: 0.3 10*3/uL (ref 0.1–1.0)
Monocytes Relative: 3 %
Neutro Abs: 9.2 10*3/uL — ABNORMAL HIGH (ref 1.7–7.7)
Neutrophils Relative %: 92 %
Platelets: 170 10*3/uL (ref 150–400)
RBC: 5.04 MIL/uL (ref 4.22–5.81)
RDW: 12.7 % (ref 11.5–15.5)
WBC: 10.1 10*3/uL (ref 4.0–10.5)
nRBC: 0 % (ref 0.0–0.2)

## 2022-12-18 LAB — BLOOD CULTURE ID PANEL (REFLEXED) - BCID2

## 2022-12-18 LAB — COMPREHENSIVE METABOLIC PANEL
ALT: 30 U/L (ref 0–44)
ALT: 32 U/L (ref 0–44)
AST: 59 U/L — ABNORMAL HIGH (ref 15–41)
AST: 69 U/L — ABNORMAL HIGH (ref 15–41)
Albumin: 3.2 g/dL — ABNORMAL LOW (ref 3.5–5.0)
Albumin: 2.3 g/dL — ABNORMAL LOW (ref 3.5–5.0)
Alkaline Phosphatase: 91 U/L (ref 38–126)
Alkaline Phosphatase: 46 U/L (ref 38–126)
Anion gap: 13 (ref 5–15)
Anion gap: 12 (ref 5–15)
BUN: 15 mg/dL (ref 6–20)
BUN: 16 mg/dL (ref 6–20)
CO2: 21 mmol/L — ABNORMAL LOW (ref 22–32)
CO2: 17 mmol/L — ABNORMAL LOW (ref 22–32)
Calcium: 8.4 mg/dL — ABNORMAL LOW (ref 8.9–10.3)
Calcium: 7 mg/dL — ABNORMAL LOW (ref 8.9–10.3)
Chloride: 94 mmol/L — ABNORMAL LOW (ref 98–111)
Chloride: 104 mmol/L (ref 98–111)
Creatinine, Ser: 1.19 mg/dL (ref 0.61–1.24)
Creatinine, Ser: 1.52 mg/dL — ABNORMAL HIGH (ref 0.61–1.24)
GFR, Estimated: >60 mL/min (ref >60)
GFR, Estimated: 52 mL/min — ABNORMAL LOW (ref >60)
Glucose, Bld: 386 mg/dL — ABNORMAL HIGH (ref 70–99)
Glucose, Bld: 254 mg/dL — ABNORMAL HIGH (ref 70–99)
Potassium: 3.7 mmol/L (ref 3.5–5.1)
Potassium: 3.3 mmol/L — ABNORMAL LOW (ref 3.5–5.1)
Sodium: 128 mmol/L — ABNORMAL LOW (ref 135–145)
Sodium: 133 mmol/L — ABNORMAL LOW (ref 135–145)
Total Bilirubin: 1.3 mg/dL — ABNORMAL HIGH (ref 0.3–1.2)
Total Bilirubin: 1.6 mg/dL — ABNORMAL HIGH (ref 0.3–1.2)
Total Protein: 7.7 g/dL (ref 6.5–8.1)
Total Protein: 5.6 g/dL — ABNORMAL LOW (ref 6.5–8.1)

## 2022-12-18 LAB — CBC
HCT: 35.7 % — ABNORMAL LOW (ref 39.0–52.0)
Hemoglobin: 11.7 g/dL — ABNORMAL LOW (ref 13.0–17.0)
MCH: 28.5 pg (ref 26.0–34.0)
MCHC: 32.8 g/dL (ref 30.0–36.0)
MCV: 86.9 fL (ref 80.0–100.0)
Platelets: 125 10*3/uL — ABNORMAL LOW (ref 150–400)
RBC: 4.11 MIL/uL — ABNORMAL LOW (ref 4.22–5.81)
RDW: 13.1 % (ref 11.5–15.5)
WBC: 20.4 10*3/uL — ABNORMAL HIGH (ref 4.0–10.5)
nRBC: 0 % (ref 0.0–0.2)

## 2022-12-18 LAB — COOXEMETRY PANEL
Carboxyhemoglobin: 1.9 % — ABNORMAL HIGH (ref 0.5–1.5)
Methemoglobin: <0.7 % (ref 0.0–1.5)
O2 Saturation: 72 %
Total hemoglobin: 12.3 g/dL (ref 12.0–16.0)

## 2022-12-18 LAB — HEMOGLOBIN A1C
Hgb A1c MFr Bld: 11.6 % — ABNORMAL HIGH (ref 4.8–5.6)
Mean Plasma Glucose: 286.22 mg/dL

## 2022-12-18 LAB — LACTIC ACID, PLASMA
Lactic Acid, Venous: 5.8 mmol/L (ref 0.5–1.9)
Lactic Acid, Venous: 3.4 mmol/L (ref 0.5–1.9)

## 2022-12-18 LAB — GLUCOSE, CAPILLARY
Glucose-Capillary: 232 mg/dL — ABNORMAL HIGH (ref 70–99)
Glucose-Capillary: 227 mg/dL — ABNORMAL HIGH (ref 70–99)
Glucose-Capillary: 219 mg/dL — ABNORMAL HIGH (ref 70–99)

## 2022-12-18 LAB — MAGNESIUM: Magnesium: 1 mg/dL — ABNORMAL LOW (ref 1.7–2.4)

## 2022-12-18 LAB — LIPASE, BLOOD: Lipase: 17 U/L (ref 11–51)

## 2022-12-18 LAB — I-STAT CG4 LACTIC ACID, ED
Lactic Acid, Venous: 2.9 mmol/L (ref 0.5–1.9)
Lactic Acid, Venous: 2.6 mmol/L (ref 0.5–1.9)

## 2022-12-18 LAB — BETA-HYDROXYBUTYRIC ACID: Beta-Hydroxybutyric Acid: 0.18 mmol/L (ref 0.05–0.27)

## 2022-12-18 LAB — PHOSPHORUS: Phosphorus: <1 mg/dL — CL (ref 2.5–4.6)

## 2022-12-18 LAB — RESP PANEL BY RT-PCR (RSV, FLU A&B, COVID)  RVPGX2
Influenza A by PCR: NEGATIVE
Influenza B by PCR: NEGATIVE
Resp Syncytial Virus by PCR: NEGATIVE
SARS Coronavirus 2 by RT PCR: NEGATIVE

## 2022-12-18 LAB — PROTIME-INR
INR: 1.1 (ref 0.8–1.2)
Prothrombin Time: 14.6 seconds (ref 11.4–15.2)

## 2022-12-18 LAB — AMYLASE: Amylase: 32 U/L (ref 28–100)

## 2022-12-18 LAB — HIV ANTIBODY (ROUTINE TESTING W REFLEX): HIV Screen 4th Generation wRfx: NONREACTIVE

## 2022-12-18 LAB — MRSA NEXT GEN BY PCR, NASAL: MRSA by PCR Next Gen: NOT DETECTED

## 2022-12-18 MED ORDER — LACTATED RINGERS IV BOLUS
1000.0000 mL | Freq: Once | INTRAVENOUS | Status: AC
  Administered 2022-12-18: 1000 mL via INTRAVENOUS

## 2022-12-18 MED ORDER — VANCOMYCIN HCL IN DEXTROSE 1-5 GM/200ML-% IV SOLN: 1000.0000 mg | Freq: Two times a day (BID) | INTRAVENOUS | Status: DC

## 2022-12-18 MED ORDER — CITALOPRAM HYDROBROMIDE 20 MG PO TABS
40.0000 mg | ORAL_TABLET | Freq: Every day | ORAL | Status: DC
  Administered 2022-12-19 – 2023-01-02 (×14): 40 mg via ORAL
  Filled 2022-12-18 (×14): qty 2

## 2022-12-18 MED ORDER — CLONAZEPAM 0.5 MG PO TABS: 1.0000 mg | ORAL_TABLET | Freq: Two times a day (BID) | ORAL | Status: DC

## 2022-12-18 MED ORDER — POTASSIUM CHLORIDE 10 MEQ/100ML IV SOLN: 10.0000 meq | INTRAVENOUS | Status: DC

## 2022-12-18 MED ORDER — POTASSIUM PHOSPHATES 15 MMOLE/5ML IV SOLN: 30.0000 mmol | Freq: Once | INTRAVENOUS | Status: DC

## 2022-12-18 MED ORDER — IOHEXOL 300 MG/ML  SOLN
100.0000 mL | Freq: Once | INTRAMUSCULAR | Status: AC | PRN
  Administered 2022-12-18: 100 mL via INTRAVENOUS

## 2022-12-18 MED ORDER — CLONAZEPAM 0.5 MG PO TABS
1.0000 mg | ORAL_TABLET | Freq: Two times a day (BID) | ORAL | Status: DC
  Administered 2022-12-19 – 2023-01-02 (×29): 1 mg via ORAL
  Filled 2022-12-18 (×3): qty 2
  Filled 2022-12-18: qty 1
  Filled 2022-12-18: qty 2
  Filled 2022-12-18: qty 1
  Filled 2022-12-18 (×2): qty 2
  Filled 2022-12-18 (×2): qty 1
  Filled 2022-12-18 (×4): qty 2
  Filled 2022-12-18 (×2): qty 1
  Filled 2022-12-18 (×7): qty 2
  Filled 2022-12-18: qty 1
  Filled 2022-12-18: qty 2
  Filled 2022-12-18: qty 1
  Filled 2022-12-18 (×2): qty 2
  Filled 2022-12-18: qty 1

## 2022-12-18 MED ORDER — NOREPINEPHRINE 16 MG/250ML-% IV SOLN
0.0000 ug/min | INTRAVENOUS | Status: DC
  Administered 2022-12-18 – 2022-12-19 (×2): 15 ug/min via INTRAVENOUS
  Filled 2022-12-18 (×2): qty 250

## 2022-12-18 MED ORDER — INSULIN ASPART 100 UNIT/ML IJ SOLN
0.0000 [IU] | Freq: Every day | INTRAMUSCULAR | Status: DC
  Administered 2022-12-18: 2 [IU] via SUBCUTANEOUS
  Administered 2022-12-19: 3 [IU] via SUBCUTANEOUS
  Filled 2022-12-18: qty 0.05

## 2022-12-18 MED ORDER — ACETAMINOPHEN 325 MG PO TABS
650.0000 mg | ORAL_TABLET | Freq: Once | ORAL | Status: AC
  Administered 2022-12-18: 650 mg via ORAL
  Filled 2022-12-18: qty 2

## 2022-12-18 MED ORDER — MAGNESIUM SULFATE 4 GM/100ML IV SOLN
4.0000 g | Freq: Once | INTRAVENOUS | Status: AC
  Administered 2022-12-18: 4 g via INTRAVENOUS
  Filled 2022-12-18: qty 100

## 2022-12-18 MED ORDER — NOREPINEPHRINE 4 MG/250ML-% IV SOLN
2.0000 ug/min | INTRAVENOUS | Status: DC
  Administered 2022-12-18: 15 ug/min via INTRAVENOUS
  Filled 2022-12-18: qty 250

## 2022-12-18 MED ORDER — VASOPRESSIN 20 UNITS/100 ML INFUSION FOR SHOCK
0.0000 [IU]/min | INTRAVENOUS | Status: DC
  Administered 2022-12-18 – 2022-12-20 (×4): 0.03 [IU]/min via INTRAVENOUS
  Filled 2022-12-18 (×4): qty 100

## 2022-12-18 MED ORDER — VANCOMYCIN HCL 2000 MG/400ML IV SOLN
2000.0000 mg | Freq: Once | INTRAVENOUS | Status: AC
  Administered 2022-12-18: 2000 mg via INTRAVENOUS
  Filled 2022-12-18: qty 400

## 2022-12-18 MED ORDER — ENOXAPARIN SODIUM 40 MG/0.4ML IJ SOSY
40.0000 mg | PREFILLED_SYRINGE | Freq: Every day | INTRAMUSCULAR | Status: DC
  Administered 2022-12-18 – 2022-12-24 (×7): 40 mg via SUBCUTANEOUS
  Filled 2022-12-18 (×7): qty 0.4

## 2022-12-18 MED ORDER — SODIUM CHLORIDE 0.9 % IV BOLUS
1000.0000 mL | Freq: Once | INTRAVENOUS | Status: AC
  Administered 2022-12-18: 1000 mL via INTRAVENOUS

## 2022-12-18 MED ORDER — METRONIDAZOLE 500 MG/100ML IV SOLN: 500.0000 mg | Freq: Two times a day (BID) | INTRAVENOUS | Status: DC

## 2022-12-18 MED ORDER — SODIUM CHLORIDE 0.9 % IV SOLN: INTRAVENOUS | Status: DC | PRN

## 2022-12-18 MED ORDER — SODIUM CHLORIDE 0.9 % IV SOLN
1.0000 g | Freq: Once | INTRAVENOUS | Status: AC
  Administered 2022-12-18: 1 g via INTRAVENOUS
  Filled 2022-12-18: qty 5

## 2022-12-18 MED ORDER — ACETAMINOPHEN 325 MG PO TABS
650.0000 mg | ORAL_TABLET | Freq: Four times a day (QID) | ORAL | Status: DC | PRN
  Administered 2022-12-20 – 2022-12-28 (×9): 650 mg via ORAL
  Filled 2022-12-18 (×13): qty 2

## 2022-12-18 MED ORDER — SODIUM CHLORIDE 0.9 % IV SOLN: INTRAVENOUS | Status: DC

## 2022-12-18 MED ORDER — SODIUM CHLORIDE 0.9 % IV SOLN: 250.0000 mL | INTRAVENOUS | Status: DC

## 2022-12-18 MED ORDER — CHLORHEXIDINE GLUCONATE CLOTH 2 % EX PADS
6.0000 | MEDICATED_PAD | Freq: Every day | CUTANEOUS | Status: DC
  Administered 2022-12-18 – 2023-01-01 (×15): 6 via TOPICAL

## 2022-12-18 MED ORDER — INSULIN ASPART 100 UNIT/ML IJ SOLN
0.0000 [IU] | Freq: Three times a day (TID) | INTRAMUSCULAR | Status: DC
  Administered 2022-12-18 (×2): 5 [IU] via SUBCUTANEOUS
  Administered 2022-12-19: 8 [IU] via SUBCUTANEOUS
  Administered 2022-12-19 – 2022-12-20 (×3): 5 [IU] via SUBCUTANEOUS
  Administered 2022-12-20: 8 [IU] via SUBCUTANEOUS
  Administered 2022-12-20: 3 [IU] via SUBCUTANEOUS
  Administered 2022-12-21: 5 [IU] via SUBCUTANEOUS
  Administered 2022-12-21 – 2022-12-24 (×4): 2 [IU] via SUBCUTANEOUS
  Filled 2022-12-18: qty 0.15

## 2022-12-18 MED ORDER — ORAL CARE MOUTH RINSE: 15.0000 mL | OROMUCOSAL | Status: DC | PRN

## 2022-12-18 MED ORDER — METHADONE HCL 10 MG PO TABS
130.0000 mg | ORAL_TABLET | Freq: Every day | ORAL | Status: DC
  Administered 2022-12-19 – 2022-12-20 (×2): 130 mg via ORAL
  Filled 2022-12-18 (×2): qty 13

## 2022-12-18 MED ORDER — POTASSIUM CHLORIDE 10 MEQ/50ML IV SOLN
10.0000 meq | INTRAVENOUS | Status: AC
  Administered 2022-12-18 (×4): 10 meq via INTRAVENOUS
  Filled 2022-12-18 (×4): qty 50

## 2022-12-18 MED ORDER — SODIUM CHLORIDE (PF) 0.9 % IJ SOLN
INTRAMUSCULAR | Status: AC
  Filled 2022-12-18: qty 50

## 2022-12-18 MED ORDER — ALBUTEROL SULFATE (2.5 MG/3ML) 0.083% IN NEBU: 2.5000 mg | INHALATION_SOLUTION | RESPIRATORY_TRACT | Status: DC | PRN

## 2022-12-18 MED ORDER — NOREPINEPHRINE 4 MG/250ML-% IV SOLN
0.0000 ug/min | INTRAVENOUS | Status: DC
  Administered 2022-12-18: 2 ug/min via INTRAVENOUS
  Filled 2022-12-18: qty 250

## 2022-12-18 MED ORDER — SODIUM CHLORIDE 0.9 % IV SOLN
1.0000 g | Freq: Three times a day (TID) | INTRAVENOUS | Status: DC
  Administered 2022-12-18: 1 g via INTRAVENOUS
  Filled 2022-12-18 (×2): qty 20

## 2022-12-18 MED ORDER — CITALOPRAM HYDROBROMIDE 10 MG PO TABS: 40.0000 mg | ORAL_TABLET | Freq: Every day | ORAL | Status: DC

## 2022-12-18 MED ORDER — METHADONE HCL 10 MG PO TABS
150.0000 mg | ORAL_TABLET | Freq: Once | ORAL | Status: AC
  Administered 2022-12-18: 150 mg via ORAL
  Filled 2022-12-18: qty 15

## 2022-12-18 MED ORDER — ACETAMINOPHEN 650 MG RE SUPP: 650.0000 mg | Freq: Four times a day (QID) | RECTAL | Status: DC | PRN

## 2022-12-18 MED ORDER — SODIUM PHOSPHATES 45 MMOLE/15ML IV SOLN
30.0000 mmol | Freq: Once | INTRAVENOUS | Status: AC
  Administered 2022-12-18: 30 mmol via INTRAVENOUS
  Filled 2022-12-18: qty 10

## 2022-12-18 MED ORDER — TRAZODONE HCL 50 MG PO TABS: 25.0000 mg | ORAL_TABLET | Freq: Every evening | ORAL | Status: DC | PRN

## 2022-12-18 MED ORDER — CEFAZOLIN SODIUM-DEXTROSE 2-4 GM/100ML-% IV SOLN
2.0000 g | Freq: Three times a day (TID) | INTRAVENOUS | Status: DC
  Administered 2022-12-18 – 2023-01-02 (×43): 2 g via INTRAVENOUS
  Filled 2022-12-18 (×45): qty 100

## 2022-12-18 NOTE — Progress Notes (Signed)
PHARMACY - PHYSICIAN COMMUNICATION CRITICAL VALUE ALERT - BLOOD CULTURE IDENTIFICATION (BCID)  Jeffrey [name] is an 57 y.o. male who presented to [facility] on 12/18/2022 with a chief complaint of sepsis of unclear source.  PMH significant for implanted pacemaker and PCN allergy = hives but had Rx for Cefdinir PO 01/2021.    Assessment:   9/26 BCx: 1/4 bottles GPC clusters 9/26 BCID: staph aureus, no resistance detected  Name of physician (or Provider) Contacted: Dr Vassie Loll  Current antibiotics:  Vancomycin, Meropenem  Changes to prescribed antibiotics recommended:  Recommendations accepted by provider Narrow to Cefazolin 2g IV q8h Automatic ID consult   Results for orders placed or performed during the hospital encounter of 12/18/22  Blood Culture ID Panel (Reflexed) (Collected: 12/18/2022  2:49 AM)  Result Value Ref Range   Enterococcus faecalis NOT DETECTED NOT DETECTED   Enterococcus Faecium NOT DETECTED NOT DETECTED   Listeria monocytogenes NOT DETECTED NOT DETECTED   Staphylococcus species DETECTED (A) NOT DETECTED   Staphylococcus aureus (BCID) DETECTED (A) NOT DETECTED   Staphylococcus epidermidis NOT DETECTED NOT DETECTED   Staphylococcus lugdunensis NOT DETECTED NOT DETECTED   Streptococcus species NOT DETECTED NOT DETECTED   Streptococcus agalactiae NOT DETECTED NOT DETECTED   Streptococcus pneumoniae NOT DETECTED NOT DETECTED   Streptococcus pyogenes NOT DETECTED NOT DETECTED   A.calcoaceticus-baumannii NOT DETECTED NOT DETECTED   Bacteroides fragilis NOT DETECTED NOT DETECTED   Enterobacterales NOT DETECTED NOT DETECTED   Enterobacter cloacae complex NOT DETECTED NOT DETECTED   Escherichia coli NOT DETECTED NOT DETECTED   Klebsiella aerogenes NOT DETECTED NOT DETECTED   Klebsiella oxytoca NOT DETECTED NOT DETECTED   Klebsiella pneumoniae NOT DETECTED NOT DETECTED   Proteus species NOT DETECTED NOT DETECTED   Salmonella species NOT DETECTED NOT DETECTED   Serratia  marcescens NOT DETECTED NOT DETECTED   Haemophilus influenzae NOT DETECTED NOT DETECTED   Neisseria meningitidis NOT DETECTED NOT DETECTED   Pseudomonas aeruginosa NOT DETECTED NOT DETECTED   Stenotrophomonas maltophilia NOT DETECTED NOT DETECTED   Candida albicans NOT DETECTED NOT DETECTED   Candida auris NOT DETECTED NOT DETECTED   Candida glabrata NOT DETECTED NOT DETECTED   Candida krusei NOT DETECTED NOT DETECTED   Candida parapsilosis NOT DETECTED NOT DETECTED   Candida tropicalis NOT DETECTED NOT DETECTED   Cryptococcus neoformans/gattii NOT DETECTED NOT DETECTED   Meth resistant mecA/C and MREJ NOT DETECTED NOT DETECTED     Lynann Beaver PharmD, BCPS [facility] main pharmacy [phone] 12/18/2022 5:15 PM

## 2022-12-18 NOTE — Progress Notes (Addendum)
PCCM INTERVAL PROGRESS NOTE   Escalating pressor requirements. MM still very dry. No infectious source has presented itself. Father tells me the patient had been complaining of abdominal pain, but CT was negative. Passive leg raise elicited a significant rise in his SBP (~55mmHg). Unable to get good cardiac/IVC windows on POCUS. I suspect based on exam and leg raise he is still on the dry side. Will continue IVF resuscitation in addition to pressors. He is on his 4th liter bolus now and is getting MIVF at 125/hr.    [name], AGACNP-BC [facility]  See Amion for personal pager PCCM on call pager [phone] until 7pm. Please call Elink 7p-7a. [phone]  12/18/2022 3:59 PM

## 2022-12-18 NOTE — ED Notes (Signed)
ED TO INPATIENT HANDOFF REPORT  Name/Age/Gender Jeffrey [name] 57 y.o. male  Code Status    Code Status Orders  (From admission, onward)           Start     Ordered   12/18/22 0932  Full code  Continuous       Question:  By:  Answer:  Consent: discussion documented in EHR   12/18/22 0932           Code Status History     Date Active Date Inactive Code Status Order ID Comments User Context   08/14/2017 1046 08/14/2017 1503 Full Code [id number]  Duke Salvia, MD Inpatient       Home/SNF/Other Home  Chief Complaint Sepsis [facility]) [A41.9] Shock (HCC) [R57.9]  Level of Care/Admitting Diagnosis ED Disposition     ED Disposition  Admit   Condition  --   Comment  Hospital Area: [facility] [[id number]]  Level of Care: ICU [6]  May admit patient to [facility] or [facility] if equivalent level of care is available:: Yes  Covid Evaluation: Confirmed COVID Negative  Diagnosis: Shock [facility]) [[id number]]  Admitting Physician: Oretha Milch [[id number]]  Attending Physician: Oretha Milch [[id number]]  Certification:: I certify this patient will need inpatient services for at least 2 midnights          Medical History Past Medical History:  Diagnosis Date   Acute sinusitis 10/29/2016   8/18   Anxiety    Cerumen impaction 10/29/2016   2018   Long Q-T syndrome 12/19/2014   Pacemaker 1993 [facility] Celexa low dose - no issues w/QT    Muscular fasciculation 03/09/2015   Face, lips, hands - chronic   Opioid dependence (HCC) 12/19/2014   x20 years On Methadone since 1993 - Methadone clinic    Pacemaker    2006 [employer], Identity [address], serial # [id number]. [employer] rep must physically interrogate pacemaker.    PTSD (post-traumatic stress disorder) 12/19/2014   On Klonopin, Celexa  Potential benefits of a long term benzodiazepines  use as well as potential risks  and complications were explained to the patient and were aknowledged.    Skin abscess 03/09/2015   11/16  - small    Substance abuse (HCC)    opioids   Tremor 07/17/2016   ?etiology     Allergies Allergies  Allergen Reactions   Codeine Itching   Penicillins Hives    Has patient had a PCN reaction causing immediate rash, facial/tongue/throat swelling, SOB or lightheadedness with hypotension: unknown Has patient had a PCN reaction causing severe rash involving mucus membranes or skin necrosis: unknown Has patient had a PCN reaction that required hospitalization unknown Has patient had a PCN reaction occurring within the last 10 years: No If all of the above answers are "NO", then may proceed with Cephalosporin use.     IV Location/Drains/Wounds Patient Lines/Drains/Airways Status     Active Line/Drains/Airways     Name Placement date Placement time Site Days   Peripheral IV 12/18/22 18 G Posterior;Right Forearm 12/18/22  0241  Forearm  less than 1   Peripheral IV 12/18/22 18 G 1" Anterior;Distal;Left;Upper Arm 12/18/22  1009  Arm  less than 1   Peripheral IV 12/18/22 18 G Posterior;Right Hand 12/18/22  1047  Hand  less than 1            Labs/Imaging Results for orders placed or performed during the hospital encounter of 12/18/22 (from  the past 48 hour(s))  Resp panel by RT-PCR (RSV, Flu A&B, Covid) Anterior Nasal Swab     Status: None   Collection Time: 12/18/22  2:39 AM   Specimen: Anterior Nasal Swab  Result Value Ref Range   SARS Coronavirus 2 by RT PCR NEGATIVE NEGATIVE    Comment: (NOTE) SARS-CoV-2 target nucleic acids are NOT DETECTED.  The SARS-CoV-2 RNA is generally detectable in upper respiratory specimens during the acute phase of infection. The lowest concentration of SARS-CoV-2 viral copies this assay can detect is 138 copies/mL. A negative result does not preclude SARS-Cov-2 infection and should not be used as the sole basis for treatment or other patient management decisions. A negative result may occur with  improper specimen collection/handling, submission  of specimen other than nasopharyngeal swab, presence of viral mutation(s) within the areas targeted by this assay, and inadequate number of viral copies(<138 copies/mL). A negative result must be combined with clinical observations, patient history, and epidemiological information. The expected result is Negative.  Fact Sheet for Patients:  BloggerCourse.com  Fact Sheet for Healthcare Providers:  SeriousBroker.it  This test is no t yet approved or cleared by the [place] FDA and  has been authorized for detection and/or diagnosis of SARS-CoV-2 by FDA under an Emergency Use Authorization (EUA). This EUA will remain  in effect (meaning this test can be used) for the duration of the COVID-19 declaration under Section 564(b)(1) of the Act, 21 U.S.C.section 360bbb-3(b)(1), unless the authorization is terminated  or revoked sooner.       Influenza A by PCR NEGATIVE NEGATIVE   Influenza B by PCR NEGATIVE NEGATIVE    Comment: (NOTE) The Xpert Xpress SARS-CoV-2/FLU/RSV plus assay is intended as an aid in the diagnosis of influenza from Nasopharyngeal swab specimens and should not be used as a sole basis for treatment. Nasal washings and aspirates are unacceptable for Xpert Xpress SARS-CoV-2/FLU/RSV testing.  Fact Sheet for Patients: BloggerCourse.com  Fact Sheet for Healthcare Providers: SeriousBroker.it  This test is not yet approved or cleared by the [place] FDA and has been authorized for detection and/or diagnosis of SARS-CoV-2 by FDA under an Emergency Use Authorization (EUA). This EUA will remain in effect (meaning this test can be used) for the duration of the COVID-19 declaration under Section 564(b)(1) of the Act, 21 U.S.C. section 360bbb-3(b)(1), unless the authorization is terminated or revoked.     Resp Syncytial Virus by PCR NEGATIVE NEGATIVE    Comment:  (NOTE) Fact Sheet for Patients: BloggerCourse.com  Fact Sheet for Healthcare Providers: SeriousBroker.it  This test is not yet approved or cleared by the [place] FDA and has been authorized for detection and/or diagnosis of SARS-CoV-2 by FDA under an Emergency Use Authorization (EUA). This EUA will remain in effect (meaning this test can be used) for the duration of the COVID-19 declaration under Section 564(b)(1) of the Act, 21 U.S.C. section 360bbb-3(b)(1), unless the authorization is terminated or revoked.  Performed at [facility], [address], [place], [place] [address]   Comprehensive metabolic panel     Status: Abnormal   Collection Time: 12/18/22  2:41 AM  Result Value Ref Range   Sodium 128 (L) 135 - 145 mmol/L   Potassium 3.7 3.5 - 5.1 mmol/L   Chloride 94 (L) 98 - 111 mmol/L   CO2 21 (L) 22 - 32 mmol/L   Glucose, Bld 386 (H) 70 - 99 mg/dL    Comment: Glucose reference range applies only to samples taken  after fasting for at least 8 hours.   BUN 15 6 - 20 mg/dL   Creatinine, Ser 1.61 0.61 - 1.24 mg/dL   Calcium 8.4 (L) 8.9 - 10.3 mg/dL   Total Protein 7.7 6.5 - 8.1 g/dL   Albumin 3.2 (L) 3.5 - 5.0 g/dL   AST 59 (H) 15 - 41 U/L   ALT 30 0 - 44 U/L   Alkaline Phosphatase 91 38 - 126 U/L   Total Bilirubin 1.3 (H) 0.3 - 1.2 mg/dL   GFR, Estimated >09 >60 mL/min    Comment: (NOTE) Calculated using the CKD-EPI Creatinine Equation (2021)    Anion gap 13 5 - 15    Comment: Performed at [facility], [address], [place], [place] [address]  CBC with Differential     Status: Abnormal   Collection Time: 12/18/22  2:41 AM  Result Value Ref Range   WBC 10.1 4.0 - 10.5 K/uL   RBC 5.04 4.22 - 5.81 MIL/uL   Hemoglobin 14.5 13.0 - 17.0 g/dL   HCT 81.1 91.4 - 78.2 %   MCV 84.7 80.0 - 100.0 fL   MCH 28.8 26.0 - 34.0 pg   MCHC 34.0 30.0 - 36.0 g/dL   RDW 95.6 21.3 - 08.6 %    Platelets 170 150 - 400 K/uL   nRBC 0.0 0.0 - 0.2 %   Neutrophils Relative % 92 %   Neutro Abs 9.2 (H) 1.7 - 7.7 K/uL   Lymphocytes Relative 4 %   Lymphs Abs 0.4 (L) 0.7 - 4.0 K/uL   Monocytes Relative 3 %   Monocytes Absolute 0.3 0.1 - 1.0 K/uL   Eosinophils Relative 0 %   Eosinophils Absolute 0.0 0.0 - 0.5 K/uL   Basophils Relative 0 %   Basophils Absolute 0.0 0.0 - 0.1 K/uL   Immature Granulocytes 1 %   Abs Immature Granulocytes 0.06 0.00 - 0.07 K/uL    Comment: Performed at [facility], [address] [place], [place] [address]  Protime-INR     Status: None   Collection Time: 12/18/22  2:41 AM  Result Value Ref Range   Prothrombin Time 14.6 11.4 - 15.2 seconds   INR 1.1 0.8 - 1.2    Comment: (NOTE) INR goal varies based on device and disease states. Performed at [facility], [address] [place], [place] [address]   I-Stat Lactic Acid, ED     Status: Abnormal   Collection Time: 12/18/22  2:53 AM  Result Value Ref Range   Lactic Acid, Venous 2.9 (HH) 0.5 - 1.9 mmol/L   Comment NOTIFIED PHYSICIAN   I-Stat Lactic Acid, ED     Status: Abnormal   Collection Time: 12/18/22  4:50 AM  Result Value Ref Range   Lactic Acid, Venous 2.6 (HH) 0.5 - 1.9 mmol/L   Comment NOTIFIED PHYSICIAN   Urinalysis, w/ Reflex to Culture (Infection Suspected) -Urine, Clean Catch     Status: Abnormal   Collection Time: 12/18/22  5:50 AM  Result Value Ref Range   Specimen Source URINE, CATHETERIZED    Color, Urine AMBER (A) YELLOW    Comment: BIOCHEMICALS MAY BE AFFECTED BY COLOR   APPearance HAZY (A) CLEAR   Specific Gravity, Urine 1.025 1.005 - 1.030   pH 5.0 5.0 - 8.0   Glucose, UA 50 (A) NEGATIVE mg/dL   Hgb urine dipstick NEGATIVE NEGATIVE   Bilirubin Urine NEGATIVE NEGATIVE   Ketones, ur 5 (A) NEGATIVE mg/dL  Protein, ur 30 (A) NEGATIVE mg/dL   Nitrite NEGATIVE NEGATIVE   Leukocytes,Ua TRACE (A) NEGATIVE   RBC / HPF 0-5 0 - 5 RBC/hpf   WBC,  UA 0-5 0 - 5 WBC/hpf    Comment:        Reflex urine culture not performed if WBC <=10, OR if Squamous epithelial cells >5. If Squamous epithelial cells >5 suggest recollection.    Bacteria, UA RARE (A) NONE SEEN   Squamous Epithelial / HPF 0-5 0 - 5 /HPF   Mucus PRESENT     Comment: Performed at [facility], [address] [place], [place] [address]   CT CHEST ABDOMEN PELVIS W CONTRAST  Result Date: 12/18/2022 CLINICAL DATA:  Sepsis. EXAM: CT CHEST, ABDOMEN, AND PELVIS WITH CONTRAST TECHNIQUE: Multidetector CT imaging of the chest, abdomen and pelvis was performed following the standard protocol during bolus administration of intravenous contrast. RADIATION DOSE REDUCTION: This exam was performed according to the departmental dose-optimization program which includes automated exposure control, adjustment of the mA and/or kV according to patient size and/or use of iterative reconstruction technique. CONTRAST:  OMNIPAQUE IOHEXOL 300 MG/ML  SOLN COMPARISON:  None Available. FINDINGS: CT CHEST FINDINGS Cardiovascular: Normal heart size. No pericardial effusion. There is a left chest wall dual lead pacer. Mediastinum/Nodes: No enlarged mediastinal, hilar, or axillary lymph nodes. Thyroid gland, trachea, and esophagus demonstrate no significant findings. Lungs/Pleura: No pleural fluid or pneumothorax. No airspace consolidation identified. Mosaic attenuation pattern is identified within both lower lung zones. Mild bilateral lower lobe bronchial wall thickening. Musculoskeletal: No chest wall mass or suspicious bone lesions identified. Bilateral gynecomastia. CT ABDOMEN PELVIS FINDINGS Hepatobiliary: No suspicious liver abnormality. Subjective hepatic steatosis. Gallbladder appears normal. There is no bile duct dilatation. Pancreas: Unremarkable. No pancreatic ductal dilatation or surrounding inflammatory changes. Spleen: Spleen measures 15.3 cm in cranial caudal dimension. No focal  splenic lesion. Adrenals/Urinary Tract: Normal adrenal glands. Simple cyst arises off the upper pole of the left kidney measuring 1.2 cm. Cyst off the inferior pole of the left kidney measures 1 cm. No follow-up imaging recommended. No signs of nephrolithiasis or hydronephrosis. Urinary bladder is unremarkable. Stomach/Bowel: Stomach is normal. The appendix is visualized and is within normal limits. No pathologic dilatation of the large or small bowel loops. Proximal transverse colon appears decompressed with mucosal enhancement and wall thickening without surrounding inflammatory fat stranding, image 134/8. Intramural fatty deposition is identified involving the ileum and ascending colon. Vascular/Lymphatic: Mild aortic atherosclerosis. Porta hepatic lymph node measures 1.2 cm, image 58/2. Previously 1 cm. Portocaval lymph node measures 1.1 cm, image 56/2. Previously 0.8 cm. No pelvic or inguinal adenopathy. Reproductive: Prostate is unremarkable. Other: No ascites or focal fluid collections. Laxity of the midline ventral abdominal wall identified. No signs of pneumoperitoneum. Musculoskeletal: No acute or suspicious osseous findings. L5-S1 degenerative disc disease. IMPRESSION: 1. No acute findings within the chest, abdomen or pelvis. 2. Mosaic attenuation pattern within both lower lung zones with mild bilateral lower lobe bronchial wall thickening. Findings are nonspecific but may be seen in the setting of small airways disease. 3. Proximal transverse colon appears decompressed with mucosal enhancement and wall thickening without surrounding inflammatory fat stranding. Findings are nonspecific and may be related to underdistention. Correlate for any clinical signs or symptoms of colitis. 4. Intramural fatty deposition is identified involving the ileum and ascending colon. This is a nonspecific finding but can be seen in the setting of chronic inflammatory bowel disease. 5. Hepatic steatosis. 6. Splenomegaly.  7.  Mildly enlarged porta hepatic and portocaval lymph nodes. These are nonspecific and may be reactive. 8.  Aortic Atherosclerosis (ICD10-I70.0). Electronically Signed   By: Signa Kell M.D.   On: 12/18/2022 07:46   DG Chest Port 1 View  Result Date: 12/18/2022 CLINICAL DATA:  57 year old male with suspected meningococcal sepsis. Weakness. EXAM: PORTABLE CHEST 1 VIEW COMPARISON:  Chest radiographs 07/11/2017 and earlier. FINDINGS: Portable AP semi upright view at 0304 hours. Low lung volumes and mildly rotated to the left. Stable chronic left chest dual lead pacemaker. Stable cardiac size and mediastinal contours. Allowing for low lung volumes, Allowing for portable technique the lungs are clear. No pneumothorax, pulmonary edema, pleural effusion, or consolidation identified. No acute osseous abnormality identified. Paucity of bowel gas in the visible abdomen. IMPRESSION: Low lung volumes, otherwise no acute cardiopulmonary abnormality. Electronically Signed   By: Odessa Fleming M.D.   On: 12/18/2022 04:23    Pending Labs Unresulted Labs (From admission, onward)     Start     Ordered   12/19/22 0500  Basic metabolic panel  Tomorrow morning,   R        12/18/22 0932   12/19/22 0500  CBC  Tomorrow morning,   R        12/18/22 0932   12/18/22 0931  HIV Antibody (routine testing w rflx)  (HIV Antibody (Routine testing w reflex) panel)  Once,   R        12/18/22 0932   12/18/22 0235  Culture, blood (Routine x 2)  BLOOD CULTURE X 2,   R (with STAT occurrences)      12/18/22 0235            Vitals/Pain Today's Vitals   12/18/22 1120 12/18/22 1124 12/18/22 1126 12/18/22 1130  BP: 99/60 (!) 103/59 99/62 (!) 97/59  Pulse: (!) 103 (!) 102 (!) 102 (!) 103  Resp: (!) 23 18 (!) 26 (!) 31  Temp:      TempSrc:      SpO2: 100% 100% 98% 100%  Weight:      Height:      PainSc:        Isolation Precautions Airborne and Contact precautions  Medications Medications  enoxaparin (LOVENOX) injection 40  mg (40 mg Subcutaneous Given 12/18/22 1026)  insulin aspart (novoLOG) injection 0-15 Units (has no administration in time range)  insulin aspart (novoLOG) injection 0-5 Units (has no administration in time range)  acetaminophen (TYLENOL) tablet 650 mg (has no administration in time range)    Or  acetaminophen (TYLENOL) suppository 650 mg (has no administration in time range)  traZODone (DESYREL) tablet 25 mg (has no administration in time range)  albuterol (PROVENTIL) (2.5 MG/3ML) 0.083% nebulizer solution 2.5 mg (has no administration in time range)  vancomycin (VANCOREADY) IVPB 2000 mg/400 mL (2,000 mg Intravenous New Bag/Given 12/18/22 1022)  vancomycin (VANCOCIN) IVPB 1000 mg/200 mL premix (has no administration in time range)  meropenem (MERREM) 1 g in sodium chloride 0.9 % 100 mL IVPB (1 g Intravenous New Bag/Given 12/18/22 1024)  0.9 %  sodium chloride infusion ( Intravenous New Bag/Given 12/18/22 1104)  norepinephrine (LEVOPHED) 4mg  in (0.016 mg/mL) premix infusion (14 mcg/min Intravenous Rate/Dose Change 12/18/22 1107)  lactated ringers bolus 1,000 mL (has no administration in time range)  sodium chloride 0.9 % bolus 1,000 mL (0 mLs Intravenous Stopped 12/18/22 0441)  acetaminophen (TYLENOL) tablet 650 mg (650 mg Oral Given 12/18/22 0315)  aztreonam (AZACTAM) 1 g in sodium  chloride 0.9 % 100 mL IVPB (0 g Intravenous Stopped 12/18/22 0553)  sodium chloride 0.9 % bolus 1,000 mL (0 mLs Intravenous Stopped 12/18/22 1107)  methadone (DOLOPHINE) tablet 150 mg (150 mg Oral Given 12/18/22 0558)  iohexol (OMNIPAQUE) 300 MG/ML solution 100 mL (100 mLs Intravenous Contrast Given 12/18/22 0649)  sodium chloride 0.9 % bolus 1,000 mL (0 mLs Intravenous Stopped 12/18/22 1105)    Mobility non-ambulatory

## 2022-12-18 NOTE — H&P (Signed)
History and Physical  Jeffrey [name] WUJ:[id number] DOB: 10-03-1965 DOA: 12/18/2022  PCP: Tresa Garter, MD   Chief Complaint: Fatigue  HPI: Jeffrey [name] is a 57 y.o. male with medical history significant for long QT syndrome with pacemaker, opioid dependence, PTSD, non-insulin-dependent type 2 diabetes, obesity being admitted to the hospital with sepsis of unclear etiology.  Came to the ER by EMS earlier this morning with complaints of feeling fatigued for the last couple days, has a mild headache, but denies any abdominal pain, nausea vomiting, cough, shortness of breath, dysuria.  In the emergency department had a temperature as high as 100.8, tachycardic, blood pressure on the low side at 106/56, saturating well on room air.  Lab work and imaging as detailed below without any acute findings, though lactate 2.9.  He was given IV fluids, a dose of empiric aztreonam, and hospitalist was contacted for admission and further management.  Review of Systems: Please see HPI for pertinent positives and negatives. A complete 10 system review of systems are otherwise negative.  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, Identity XL Dr 9562, serial # [id number]. [employer] rep must physically interrogate pacemaker.    PTSD (post-traumatic stress disorder) 12/19/2014   On Klonopin, Celexa  Potential benefits of a long term benzodiazepines  use as well as potential risks  and complications were explained to the patient and were aknowledged.    Skin abscess 03/09/2015   11/16 - small    Substance abuse (HCC)    opioids   Tremor 07/17/2016    ?etiology    Past Surgical History:  Procedure Laterality Date   HERNIA REPAIR Right    LIVER SURGERY     PPM GENERATOR CHANGEOUT N/A 08/14/2017   Procedure: PPM GENERATOR CHANGEOUT;  Surgeon: Duke Salvia, MD;  Location: [facility] INVASIVE CV LAB;  Service: Cardiovascular;  Laterality: N/A;    Social History:  reports that he has never smoked. His smokeless tobacco use includes chew. He reports that he does not drink alcohol and does not use drugs.   Allergies  Allergen Reactions   Codeine Itching   Penicillins Hives    Has patient had a PCN reaction causing immediate rash, facial/tongue/throat swelling, SOB or lightheadedness with hypotension: unknown Has patient had a PCN reaction causing severe rash involving mucus membranes or skin necrosis: unknown Has patient had a PCN reaction that required hospitalization unknown Has patient had a PCN reaction occurring within the last 10 years: No If all of the above answers are "NO", then may proceed with Cephalosporin use.     Family History  Problem Relation Age of Onset   Depression Mother    Diabetes Mother    Arthritis Mother    Arthritis Father      Prior to Admission medications   Medication Sig Start Date End Date Taking? Authorizing Provider  ammonium lactate (AMLACTIN) 12 % cream Apply 1 Application topically as needed for dry skin. 11/07/22   Vivi Barrack, DPM  atenolol (TENORMIN) 50 MG tablet Take 1  tablet (50 mg total) by mouth daily. 05/21/22   Plotnikov, Georgina Quint, MD  Cholecalciferol (VITAMIN D3) 2000 UNITS capsule Take 1 capsule (2,000 Units total) by mouth daily. 12/19/14   Plotnikov, Georgina Quint, MD  citalopram (CELEXA) 40 MG tablet Take 1 tablet (40 mg total) by mouth daily. Must keep 05/21/22 appt for future refills 04/28/22   Plotnikov, Georgina Quint, MD  clonazePAM (KLONOPIN) 1 MG tablet TAKE 1 TABLET BY MOUTH TWICE A DAY 11/19/22   Plotnikov, Georgina Quint, MD  Continuous Blood Gluc Sensor (FREESTYLE LIBRE 3 SENSOR) MISC 1  Units by Does not apply route every 14 (fourteen) days. 11/21/21   Plotnikov, Georgina Quint, MD  ibuprofen (ADVIL,MOTRIN) 200 MG tablet Take 200-400 mg by mouth every 6 (six) hours as needed for headache or moderate pain.     [provider]  metFORMIN (GLUCOPHAGE) 500 MG tablet Take 1 tablet (500 mg total) by mouth 2 (two) times daily with a meal. 09/24/22   Plotnikov, Georgina Quint, MD  Methadone HCl (DOLOPHINE PO) Take 130 mg by mouth daily.    [provider]  mupirocin ointment (BACTROBAN) 2 % On leg wound w/dressing change qd or bid 09/23/22   Plotnikov, Georgina Quint, MD  silver sulfADIAZINE (SILVADENE) 1 % cream Apply 1 Application topically daily. 10/07/22   Vivi Barrack, DPM  triamcinolone cream (KENALOG) 0.1 % Apply 1 application topically 3 (three) times daily. 02/04/21   Plotnikov, Georgina Quint, MD  valACYclovir (VALTREX) 1000 MG tablet Take 1 tablet (1,000 mg total) by mouth 3 (three) times daily. 05/21/22   Plotnikov, Georgina Quint, MD  Vitamin D, Ergocalciferol, (DRISDOL) 1.25 MG (50000 UNIT) CAPS capsule Take 1 capsule (50,000 Units total) by mouth every 7 (seven) days. 09/24/22   Plotnikov, Georgina Quint, MD    Physical Exam: BP (!) 106/56   Pulse (!) 101   Temp (!) 100.4 F (38 C) (Oral)   Resp 16   Ht 6\' 1"  (1.854 m)   Wt 117.9 kg   SpO2 96%   BMI 34.30 kg/m   General:  Alert, oriented, calm, in no acute distress, looks disheveled but comfortable Eyes: EOMI, clear conjuctivae, white sclerea Neck: supple, no masses, trachea mildline  Cardiovascular: RRR, no murmurs or rubs, no peripheral edema  Respiratory: clear to auscultation bilaterally, no wheezes, no crackles  Abdomen: soft, nontender, nondistended, normal bowel tones heard  Skin: dry, no rashes, well-healing very shallow ulceration at the base of the right foot, with no surrounding swelling, erythema, or other evidence of acute infection Musculoskeletal: no joint effusions, normal range of motion  Psychiatric:  appropriate affect, normal speech  Neurologic: extraocular muscles intact, clear speech, moving all extremities with intact sensorium         Labs on Admission:  Basic Metabolic Panel: Recent Labs  Lab 12/18/22 0241  NA 128*  K 3.7  CL 94*  CO2 21*  GLUCOSE 386*  BUN 15  CREATININE 1.19  CALCIUM 8.4*   Liver Function Tests: Recent Labs  Lab 12/18/22 0241  AST 59*  ALT 30  ALKPHOS 91  BILITOT 1.3*  PROT 7.7  ALBUMIN 3.2*   No results for input(s): "LIPASE", "AMYLASE" in the last 168 hours. No results for input(s): "AMMONIA" in the last 168 hours. CBC: Recent Labs  Lab 12/18/22 0241  WBC 10.1  NEUTROABS 9.2*  HGB 14.5  HCT 42.7  MCV 84.7  PLT 170   Cardiac Enzymes: No results for input(s): "CKTOTAL", "CKMB", "CKMBINDEX", "TROPONINI" in the  last 168 hours.  BNP (last 3 results) No results for input(s): "BNP" in the last 8760 hours.  ProBNP (last 3 results) No results for input(s): "PROBNP" in the last 8760 hours.  CBG: No results for input(s): "GLUCAP" in the last 168 hours.  Radiological Exams on Admission: CT CHEST ABDOMEN PELVIS W CONTRAST  Result Date: 12/18/2022 CLINICAL DATA:  Sepsis. EXAM: CT CHEST, ABDOMEN, AND PELVIS WITH CONTRAST TECHNIQUE: Multidetector CT imaging of the chest, abdomen and pelvis was performed following the standard protocol during bolus administration of intravenous contrast. RADIATION DOSE REDUCTION: This exam was performed according to the departmental dose-optimization program which includes automated exposure control, adjustment of the mA and/or kV according to patient size and/or use of iterative reconstruction technique. CONTRAST:  OMNIPAQUE IOHEXOL 300 MG/ML  SOLN COMPARISON:  None Available. FINDINGS: CT CHEST FINDINGS Cardiovascular: Normal heart size. No pericardial effusion. There is a left chest wall dual lead pacer. Mediastinum/Nodes: No enlarged mediastinal, hilar, or axillary lymph nodes. Thyroid gland, trachea,  and esophagus demonstrate no significant findings. Lungs/Pleura: No pleural fluid or pneumothorax. No airspace consolidation identified. Mosaic attenuation pattern is identified within both lower lung zones. Mild bilateral lower lobe bronchial wall thickening. Musculoskeletal: No chest wall mass or suspicious bone lesions identified. Bilateral gynecomastia. CT ABDOMEN PELVIS FINDINGS Hepatobiliary: No suspicious liver abnormality. Subjective hepatic steatosis. Gallbladder appears normal. There is no bile duct dilatation. Pancreas: Unremarkable. No pancreatic ductal dilatation or surrounding inflammatory changes. Spleen: Spleen measures 15.3 cm in cranial caudal dimension. No focal splenic lesion. Adrenals/Urinary Tract: Normal adrenal glands. Simple cyst arises off the upper pole of the left kidney measuring 1.2 cm. Cyst off the inferior pole of the left kidney measures 1 cm. No follow-up imaging recommended. No signs of nephrolithiasis or hydronephrosis. Urinary bladder is unremarkable. Stomach/Bowel: Stomach is normal. The appendix is visualized and is within normal limits. No pathologic dilatation of the large or small bowel loops. Proximal transverse colon appears decompressed with mucosal enhancement and wall thickening without surrounding inflammatory fat stranding, image 134/8. Intramural fatty deposition is identified involving the ileum and ascending colon. Vascular/Lymphatic: Mild aortic atherosclerosis. Porta hepatic lymph node measures 1.2 cm, image 58/2. Previously 1 cm. Portocaval lymph node measures 1.1 cm, image 56/2. Previously 0.8 cm. No pelvic or inguinal adenopathy. Reproductive: Prostate is unremarkable. Other: No ascites or focal fluid collections. Laxity of the midline ventral abdominal wall identified. No signs of pneumoperitoneum. Musculoskeletal: No acute or suspicious osseous findings. L5-S1 degenerative disc disease. IMPRESSION: 1. No acute findings within the chest, abdomen or pelvis. 2.  Mosaic attenuation pattern within both lower lung zones with mild bilateral lower lobe bronchial wall thickening. Findings are nonspecific but may be seen in the setting of small airways disease. 3. Proximal transverse colon appears decompressed with mucosal enhancement and wall thickening without surrounding inflammatory fat stranding. Findings are nonspecific and may be related to underdistention. Correlate for any clinical signs or symptoms of colitis. 4. Intramural fatty deposition is identified involving the ileum and ascending colon. This is a nonspecific finding but can be seen in the setting of chronic inflammatory bowel disease. 5. Hepatic steatosis. 6. Splenomegaly. 7. Mildly enlarged porta hepatic and portocaval lymph nodes. These are nonspecific and may be reactive. 8.  Aortic Atherosclerosis (ICD10-I70.0). Electronically Signed   By: Signa Kell M.D.   On: 12/18/2022 07:46   DG Chest Port 1 View  Result Date: 12/18/2022 CLINICAL DATA:  57 year old male with suspected meningococcal sepsis. Weakness. EXAM: PORTABLE CHEST 1 VIEW COMPARISON:  Chest radiographs 07/11/2017 and earlier. FINDINGS: Portable AP semi upright view at 0304 hours. Low lung volumes and mildly rotated to the left. Stable chronic left chest dual lead pacemaker. Stable cardiac size and mediastinal contours. Allowing for low lung volumes, Allowing for portable technique the lungs are clear. No pneumothorax, pulmonary edema, pleural effusion, or consolidation identified. No acute osseous abnormality identified. Paucity of bowel gas in the visible abdomen. IMPRESSION: Low lung volumes, otherwise no acute cardiopulmonary abnormality. Electronically Signed   By: Odessa Fleming M.D.   On: 12/18/2022 04:23    Assessment/Plan Jeffrey [name] is a 57 y.o. male with medical history significant for long QT syndrome with pacemaker, opioid dependence, PTSD, non-insulin-dependent type 2 diabetes, obesity being admitted to the hospital with sepsis  of unclear etiology.   Sepsis-meeting criteria with fever, tachycardia, endorgan dysfunction with lactate 2.9.  Source is currently unclear. -Inpatient admission to progressive -Follow-up blood cultures -Given severe penicillin allergy, will treat broadly with IV vancomycin and IV meropenem  -Patient currently hypotensive, will give a second fluid bolus now, and trend blood pressure.  If remains inadequate, may need admission to stepdown unit or even peripheral pressors.  Opioid dependence-will continue methadone at home dose once verified  History of QT prolongation and hypertension-on atenolol at home, he is status post pacemaker -Hold home atenolol due to hypotension  Type 2 diabetes-patient currently not on any therapy for this.  Last hemoglobin A1c 9.5 in July. -Carb controlled diet -Moderate dose sliding scale  Diabetic ulceration of the right foot, followed by podiatry for this, and currently no evidence of acute infection acute infection. No significant change when compared to photographs taken in Podiatry clinic on 9/6.  Obesity-BMI 34.3, complicating all aspects of care  History of PTSD-on citalopram and Celexa, will continue these once reconciled  DVT prophylaxis: Lovenox     Code Status: Full Code  Consults called: None  Admission status: The appropriate patient status for this patient is INPATIENT. Inpatient status is judged to be reasonable and necessary in order to provide the required intensity of service to ensure the patient's safety. The patient's presenting symptoms, physical exam findings, and initial radiographic and laboratory data in the context of their chronic comorbidities is felt to place them at high risk for further clinical deterioration. Furthermore, it is not anticipated that the patient will be medically stable for discharge from the hospital within 2 midnights of admission.    I certify that at the point of admission it is my clinical judgment that  the patient will require inpatient hospital care spanning beyond 2 midnights from the point of admission due to high intensity of service, high risk for further deterioration and high frequency of surveillance required  Time spent: 59 minutes  Amour Cutrone Sharlette Dense MD [facility] Pager [phone]  If 7PM-7AM, please contact night-coverage [email or web address] Password [facility]  12/18/2022, 9:33 AM

## 2022-12-18 NOTE — H&P (Deleted)
NAME:  Jeffrey [name], MRN:  [id number], DOB:  [birth year], LOS: 0 ADMISSION DATE:  12/18/2022, CONSULTATION DATE: 9/26 REFERRING MD: Dr. Kirby Crigler, CHIEF COMPLAINT: Hypotension  History of Present Illness:  57 year old male with past medical history as below, which is significant for PTSD, opioid dependence on methadone therapy for 30 years, prediabetes, tremor, and right foot ulceration.  He was in his usual state of health until 9/25 when he developed profound weakness and reported fevers.  Complains of diffuse joint pain but no other significant complaints.  Upon arrival to the emergency department he was febrile and tachycardic.  Infectious workup including labs and CT chest abdomen pelvis were without obvious source.  He was admitted to the hospital service, however, shortly after he became hypotensive refractory to IV fluids and was started on norepinephrine infusion.  PCCM was asked to evaluate for ICU admission.  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   Significant Hospital Events: Including procedures, antibiotic start and stop dates in addition to other pertinent events   9/26 admitted for ? Sepsis, became hypotensive. NE started. Transfer to ICU.   Interim History / Subjective:    Objective   Blood pressure (!) 97/59, pulse (!) 103, temperature 99.5 F (37.5 C), temperature source Oral, resp. rate (!) 31, height 6\' 1"  (1.854 m), weight 117.9 kg, SpO2 100%.        Intake/Output Summary (Last 24 hours) at 12/18/2022 1139 Last data filed at 12/18/2022 1107 Gross per 24 hour  Intake 3099 ml  Output --  Net 3099 ml   Filed Weights   12/18/22 0231   Weight: 117.9 kg    Examination: General: overweight middle aged male in NAD HENT: [place]/AT, PERRL, no JVD. Mucous membranes very dry. Edentulous. Lungs: Clear bilateral breath sounds Cardiovascular: RRR, no MRG Abdomen: Soft, NT, ND Extremities: No acute deformity or ROM limitation Neuro: Alert, oriented, non-focal  Resolved Hospital Problem list     Assessment & Plan:   Shock: etiology remains unclear. He is certainly hypovolemic, but cannot rule out sepsis with body aches and fevers. WBC normal. No obvious septic source. Does have a pacemaker so in theory could related to that. Known foot wound on the right looks much better than recent outpatient photos.  - Ongoing IVF resuscitation - NE for MAP goal 65 mmHg - Echocardiogram - May need CVL - Blood cultures pending - Meropenem and Vancomycin (PCN allergy)  DM - on metformin at home Hyperglycemia - CBG monitoring and SSI - Check A1C and BHB (small ketones in urine)  Lactic acidosis - hemodynamic support - ensure lactic clearing - Check BHB to ensure no DKA  Opioid dependence - continue home methadone 130mg  daily  Long QT - avoid QT prolonging meds - Mag goal > 2 and K goal > 4  Anxiety - continue citalopram and clonazepam   Best Practice (right click and "Reselect all SmartList Selections" daily)   Diet/type: Regular consistency (see orders) DVT prophylaxis: LMWH GI prophylaxis: N/A Lines: N/A Foley:  N/A Code Status:  full code Last date of multidisciplinary goals of care discussion [ ]   Labs   CBC: Recent Labs  Lab 12/18/22 0241  WBC 10.1  NEUTROABS 9.2*  HGB 14.5  HCT 42.7  MCV 84.7  PLT 170  Basic Metabolic Panel: Recent Labs  Lab 12/18/22 0241  NA 128*  K 3.7  CL 94*  CO2 21*  GLUCOSE 386*  BUN 15  CREATININE 1.19  CALCIUM 8.4*   GFR: Estimated Creatinine Clearance: 89.9 mL/min (by C-G formula based on SCr of 1.19 mg/dL). Recent Labs  Lab 12/18/22 0241 12/18/22 0253  12/18/22 0450  WBC 10.1  --   --   LATICACIDVEN  --  2.9* 2.6*    Liver Function Tests: Recent Labs  Lab 12/18/22 0241  AST 59*  ALT 30  ALKPHOS 91  BILITOT 1.3*  PROT 7.7  ALBUMIN 3.2*   No results for input(s): "LIPASE", "AMYLASE" in the last 168 hours. No results for input(s): "AMMONIA" in the last 168 hours.  ABG    Component Value Date/Time   HCO3 33.0 (H) 06/12/2007 1600   TCO2 28 09/18/2007 0206     Coagulation Profile: Recent Labs  Lab 12/18/22 0241  INR 1.1    Cardiac Enzymes: No results for input(s): "CKTOTAL", "CKMB", "CKMBINDEX", "TROPONINI" in the last 168 hours.  HbA1C: Hgb A1c MFr Bld  Date/Time Value Ref Range Status  09/23/2022 09:25 AM 9.5 (H) 4.6 - 6.5 % Final    Comment:    Glycemic Control Guidelines for People with Diabetes:Non Diabetic:  <6%Goal of Therapy: <7%Additional Action Suggested:  >8%   12/13/2008 12:45 PM  4.6 - 6.1 % Final   5.8 (NOTE) The ADA recommends the following therapeutic goal for glycemic control related to Hgb A1c measurement: Goal of therapy: <6.5 Hgb A1c  Reference: American Diabetes Association: Clinical Practice Recommendations 2010, Diabetes Care, 2010, 33: (Suppl  1).    CBG: No results for input(s): "GLUCAP" in the last 168 hours.  Review of Systems:   Bolds are positive  Constitutional: weight loss, gain, night sweats, Fevers, chills, fatigue .  HEENT: headaches, Sore throat, sneezing, nasal congestion, post nasal drip, Difficulty swallowing, Tooth/dental problems, visual complaints visual changes, ear ache CV:  chest pain, radiates:,Orthopnea, PND, swelling in lower extremities, dizziness, palpitations, syncope.  GI  heartburn, indigestion, abdominal pain, nausea, vomiting, diarrhea, change in bowel habits, loss of appetite, bloody stools.  Resp: cough, productive: , hemoptysis, dyspnea, chest pain, pleuritic.  Skin: rash or itching or icterus GU: dysuria, change in color of urine, urgency or frequency.  flank pain, hematuria  MS: L shoulder pain joint pain or swelling. decreased range of motion  Psych: change in mood or affect. depression or anxiety.  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   Surgical History:   Past Surgical History:  Procedure Laterality Date   HERNIA REPAIR Right    LIVER SURGERY     PPM GENERATOR CHANGEOUT N/A 08/14/2017   Procedure: PPM GENERATOR CHANGEOUT;  Surgeon: Duke Salvia, MD;  Location: [facility] INVASIVE CV LAB;  Service: Cardiovascular;  Laterality: N/A;     Social History:   reports that he has never smoked. His smokeless tobacco use includes chew. He reports that he does not drink alcohol and does not use drugs.   Family History:  His family history includes Arthritis in his father and mother; Depression in his mother; Diabetes in his mother.   Allergies Allergies  Allergen Reactions  Codeine Itching   Penicillins Hives    Has patient had a PCN reaction causing immediate rash, facial/tongue/throat swelling, SOB or lightheadedness with hypotension: unknown Has patient had a PCN reaction causing severe rash involving mucus membranes or skin necrosis: unknown Has patient had a PCN reaction that required hospitalization unknown Has patient had a PCN reaction occurring within the last 10 years: No If all of the above answers are "NO", then may proceed with Cephalosporin use.      Home Medications  Prior to Admission medications   Medication Sig Start Date End Date Taking? Authorizing Provider  ammonium lactate (AMLACTIN) 12 % cream Apply 1 Application topically as  needed for dry skin. 11/07/22   Vivi Barrack, DPM  atenolol (TENORMIN) 50 MG tablet Take 1 tablet (50 mg total) by mouth daily. 05/21/22   Plotnikov, Georgina Quint, MD  Cholecalciferol (VITAMIN D3) 2000 UNITS capsule Take 1 capsule (2,000 Units total) by mouth daily. 12/19/14   Plotnikov, Georgina Quint, MD  citalopram (CELEXA) 40 MG tablet Take 1 tablet (40 mg total) by mouth daily. Must keep 05/21/22 appt for future refills 04/28/22   Plotnikov, Georgina Quint, MD  clonazePAM (KLONOPIN) 1 MG tablet TAKE 1 TABLET BY MOUTH TWICE A DAY 11/19/22   Plotnikov, Georgina Quint, MD  Continuous Blood Gluc Sensor (FREESTYLE LIBRE 3 SENSOR) MISC 1 Units by Does not apply route every 14 (fourteen) days. 11/21/21   Plotnikov, Georgina Quint, MD  ibuprofen (ADVIL,MOTRIN) 200 MG tablet Take 200-400 mg by mouth every 6 (six) hours as needed for headache or moderate pain.     [provider]  metFORMIN (GLUCOPHAGE) 500 MG tablet Take 1 tablet (500 mg total) by mouth 2 (two) times daily with a meal. 09/24/22   Plotnikov, Georgina Quint, MD  Methadone HCl (DOLOPHINE PO) Take 130 mg by mouth daily.    [provider]  mupirocin ointment (BACTROBAN) 2 % On leg wound w/dressing change qd or bid 09/23/22   Plotnikov, Georgina Quint, MD  silver sulfADIAZINE (SILVADENE) 1 % cream Apply 1 Application topically daily. 10/07/22   Vivi Barrack, DPM  triamcinolone cream (KENALOG) 0.1 % Apply 1 application topically 3 (three) times daily. 02/04/21   Plotnikov, Georgina Quint, MD  valACYclovir (VALTREX) 1000 MG tablet Take 1 tablet (1,000 mg total) by mouth 3 (three) times daily. 05/21/22   Plotnikov, Georgina Quint, MD  Vitamin D, Ergocalciferol, (DRISDOL) 1.25 MG (50000 UNIT) CAPS capsule Take 1 capsule (50,000 Units total) by mouth every 7 (seven) days. 09/24/22   Plotnikov, Georgina Quint, MD     Critical care time: 39 minutes     [name], AGACNP-BC [facility]  See Amion for personal pager PCCM on call pager [phone]  until 7pm. Please call Elink 7p-7a. [phone]  12/18/2022 1:19 PM

## 2022-12-18 NOTE — Procedures (Signed)
Central Venous Catheter Insertion Procedure Note  Jeffrey [name]  [id number]  Aug 29, 1965  Date:12/18/22  Time:3:49 PM   Provider Performing:Liliana Dang W Mikey Bussing   Procedure: Insertion of Non-tunneled Central Venous [phone]) with US guidance ([id number])   Indication(s) Medication administration  Consent Risks of the procedure as well as the alternatives and risks of each were explained to the patient and/or caregiver.  Consent for the procedure was obtained and is signed in the bedside chart  Anesthesia Topical only with 1% lidocaine   Timeout Verified patient identification, verified procedure, site/side was marked, verified correct patient position, special equipment/implants available, medications/allergies/relevant history reviewed, required imaging and test results available.  Sterile Technique Maximal sterile technique including full sterile barrier drape, hand hygiene, sterile gown, sterile gloves, mask, hair covering, sterile ultrasound probe cover (if used).  Procedure Description Area of catheter insertion was cleaned with chlorhexidine and draped in sterile fashion.  With real-time ultrasound guidance a central venous catheter was placed into the right internal jugular vein. Nonpulsatile blood flow and easy flushing noted in all ports.  The catheter was sutured in place and sterile dressing applied.  Complications/Tolerance None; patient tolerated the procedure well. Chest X-ray is ordered to verify placement for internal jugular or subclavian cannulation.   Chest x-ray is not ordered for femoral cannulation.  EBL Minimal  Specimen(s) None     Jeffrey [name], AGACNP-BC [facility]  See Amion for personal pager PCCM on call pager [phone] until 7pm. Please call Elink 7p-7a. [phone]  12/18/2022 3:49 PM

## 2022-12-18 NOTE — Progress Notes (Signed)
At bedside. NP at bedside, and prefers to holf off on USGPIV at this time, as pt may receive a central line. RN aware.

## 2022-12-18 NOTE — ED Provider Notes (Signed)
Pt seen by Dr Madilyn Hook.  Please see her note.  CT scan pending at time of shift change.  No clear signs of infection on CT scan.  BP 97/53 at the bedside.  Mild tachycardia still noted.  Will order additional fluids  No clear source of infection but with elevated lactic acid and borderline bp will consult with medical service for admission   Linwood Dibbles, MD 12/18/22 [phone]

## 2022-12-18 NOTE — ED Triage Notes (Addendum)
Pt arrived from home BIB [facility] for "feeling lethargic", told EMS that he had COVID, then told another EMS personal he did not have COVID. Dry cough, headache, fatigue, and not feeling well. EMS reports A&O x4, VSS. Pt alert on arrival and answering questions appropriately. Pt is cool and clammy to touch, pt with continuous moaning. HR 120s & febrile

## 2022-12-18 NOTE — ED Notes (Signed)
Bladder scan 45 ml

## 2022-12-18 NOTE — Progress Notes (Signed)
Pharmacy Antibiotic Note  Jeffrey [name] is a 57 y.o. male admitted on 12/18/2022 with sepsis.  Pharmacy has been consulted for vanc/meropenem dosing.  Plan: Vanc 2g x 1 then 1g IV q12 - goal AUC 400-550 Meropenem 1g q8 per current renal function  Height: 6\' 1"  (185.4 cm) Weight: 117.9 kg (260 lb) IBW/kg (Calculated) : 79.9  Temp (24hrs), Avg:100.6 F (38.1 C), Min:100.4 F (38 C), Max:100.8 F (38.2 C)  Recent Labs  Lab 12/18/22 0241 12/18/22 0253 12/18/22 0450  WBC 10.1  --   --   CREATININE 1.19  --   --   LATICACIDVEN  --  2.9* 2.6*    Estimated Creatinine Clearance: 89.9 mL/min (by C-G formula based on SCr of 1.19 mg/dL).    Allergies  Allergen Reactions   Codeine Itching   Penicillins Hives    Has patient had a PCN reaction causing immediate rash, facial/tongue/throat swelling, SOB or lightheadedness with hypotension: unknown Has patient had a PCN reaction causing severe rash involving mucus membranes or skin necrosis: unknown Has patient had a PCN reaction that required hospitalization unknown Has patient had a PCN reaction occurring within the last 10 years: No If all of the above answers are "NO", then may proceed with Cephalosporin use.       Thank you for allowing pharmacy to be a part of this patient's care.  Berkley Harvey 12/18/2022 9:46 AM

## 2022-12-18 NOTE — ED Notes (Signed)
Pt. I-CHG Lactic Acid 2.60, EDP, Madilyn Hook made aware.

## 2022-12-18 NOTE — Procedures (Signed)
Arterial Catheter Insertion Procedure Note  Jeffrey [name]  [id number]  1965/03/29  Date:12/18/22  Time:3:48 PM    Provider Performing: Duayne Cal    Procedure: Insertion of Arterial Line (47425) with US guidance ([id number])   Indication(s) Blood pressure monitoring and/or need for frequent ABGs  Consent Risks of the procedure as well as the alternatives and risks of each were explained to the patient and/or caregiver.  Consent for the procedure was obtained and is signed in the bedside chart  Anesthesia None   Time Out Verified patient identification, verified procedure, site/side was marked, verified correct patient position, special equipment/implants available, medications/allergies/relevant history reviewed, required imaging and test results available.   Sterile Technique Maximal sterile technique including full sterile barrier drape, hand hygiene, sterile gown, sterile gloves, mask, hair covering, sterile ultrasound probe cover (if used).   Procedure Description Area of catheter insertion was cleaned with chlorhexidine and draped in sterile fashion. With real-time ultrasound guidance an arterial catheter was placed into the left radial artery.  Appropriate arterial tracings confirmed on monitor.     Complications/Tolerance None; patient tolerated the procedure well.   EBL Minimal   Specimen(s) None   Jeffrey [name], AGACNP-BC [facility]  See Amion for personal pager PCCM on call pager [phone] until 7pm. Please call Elink 7p-7a. [phone]  12/18/2022 3:49 PM

## 2022-12-18 NOTE — Consult Note (Signed)
NAME:  Jeffrey [name], MRN:  [id number], DOB:  1965-10-21, LOS: 0 ADMISSION DATE:  12/18/2022, CONSULTATION DATE: 9/26 REFERRING MD: Dr. Kirby Crigler, CHIEF COMPLAINT: Hypotension  History of Present Illness:  57 year old male with past medical history as below, which is significant for PTSD, opioid dependence on methadone therapy for 30 years, prediabetes, tremor, and right foot ulceration.  He was in his usual state of health until 9/25 when he developed profound weakness and reported fevers.  Complains of diffuse joint pain but no other significant complaints.  Upon arrival to the emergency department he was febrile and tachycardic.  Infectious workup including labs and CT chest abdomen pelvis were without obvious source.  He was admitted to the hospital service, however, shortly after he became hypotensive refractory to IV fluids and was started on norepinephrine infusion.  PCCM was asked to evaluate for ICU admission.  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   Significant Hospital Events: Including procedures, antibiotic start and stop dates in addition to other pertinent events   9/26 admitted for ? Sepsis, became hypotensive. NE started. Transfer to ICU.   Interim History / Subjective:    Objective   Blood pressure (!) 123/54, pulse (!) 112, temperature 98.2 F (36.8 C), temperature source Oral, resp. rate 20, height 6\' 1"  (1.854 m), weight 111.5 kg, SpO2 96%.        Intake/Output Summary (Last 24 hours) at 12/18/2022 1320 Last data filed at 12/18/2022 1301 Gross per 24 hour  Intake 3599.33 ml  Output 300 ml  Net 3299.33 ml   Filed Weights   12/18/22 0231  12/18/22 1305  Weight: 117.9 kg 111.5 kg    Examination: General: overweight middle aged male in NAD HENT: [place]/AT, PERRL, no JVD. Mucous membranes very dry. Edentulous. Lungs: Clear bilateral breath sounds Cardiovascular: RRR, no MRG Abdomen: Soft, NT, ND Extremities: No acute deformity or ROM limitation Neuro: Alert, oriented, non-focal  Resolved Hospital Problem list     Assessment & Plan:   Shock: etiology remains unclear. He is certainly hypovolemic, but cannot rule out sepsis with body aches and fevers. WBC normal. No obvious septic source. Does have a pacemaker so in theory could related to that. Known foot wound on the right looks much better than recent outpatient photos.  - Ongoing IVF resuscitation - NE for MAP goal 65 mmHg - Echocardiogram - May need CVL - Blood cultures pending - Meropenem and Vancomycin (PCN allergy)  DM - on metformin at home Hyperglycemia - CBG monitoring and SSI - Check A1C and BHB (small ketones in urine)  Lactic acidosis - hemodynamic support - ensure lactic clearing - Check BHB to ensure no DKA  Opioid dependence - continue home methadone 130mg  daily  Long QT - avoid QT prolonging meds - Mag goal > 2 and K goal > 4  Anxiety - continue citalopram and clonazepam   Best Practice (right click and "Reselect all SmartList Selections" daily)   Diet/type: Regular consistency (see orders) DVT prophylaxis: LMWH GI prophylaxis: N/A Lines: N/A Foley:  N/A Code Status:  full code Last date of multidisciplinary goals of care discussion [ ]   Labs   CBC: Recent Labs  Lab 12/18/22 0241  WBC 10.1  NEUTROABS 9.2*  HGB 14.5  HCT 42.7  MCV  84.7  PLT 170    Basic Metabolic Panel: Recent Labs  Lab 12/18/22 0241  NA 128*  K 3.7  CL 94*  CO2 21*  GLUCOSE 386*  BUN 15  CREATININE 1.19  CALCIUM 8.4*   GFR: Estimated Creatinine Clearance: 87.4 mL/min (by C-G formula based on SCr of 1.19 mg/dL). Recent Labs  Lab  12/18/22 0241 12/18/22 0253 12/18/22 0450  WBC 10.1  --   --   LATICACIDVEN  --  2.9* 2.6*    Liver Function Tests: Recent Labs  Lab 12/18/22 0241  AST 59*  ALT 30  ALKPHOS 91  BILITOT 1.3*  PROT 7.7  ALBUMIN 3.2*   No results for input(s): "LIPASE", "AMYLASE" in the last 168 hours. No results for input(s): "AMMONIA" in the last 168 hours.  ABG    Component Value Date/Time   HCO3 33.0 (H) 06/12/2007 1600   TCO2 28 09/18/2007 0206     Coagulation Profile: Recent Labs  Lab 12/18/22 0241  INR 1.1    Cardiac Enzymes: No results for input(s): "CKTOTAL", "CKMB", "CKMBINDEX", "TROPONINI" in the last 168 hours.  HbA1C: Hgb A1c MFr Bld  Date/Time Value Ref Range Status  09/23/2022 09:25 AM 9.5 (H) 4.6 - 6.5 % Final    Comment:    Glycemic Control Guidelines for People with Diabetes:Non Diabetic:  <6%Goal of Therapy: <7%Additional Action Suggested:  >8%   12/13/2008 12:45 PM  4.6 - 6.1 % Final   5.8 (NOTE) The ADA recommends the following therapeutic goal for glycemic control related to Hgb A1c measurement: Goal of therapy: <6.5 Hgb A1c  Reference: American Diabetes Association: Clinical Practice Recommendations 2010, Diabetes Care, 2010, 33: (Suppl  1).    CBG: Recent Labs  Lab 12/18/22 1312  GLUCAP 232*    Review of Systems:   Bolds are positive  Constitutional: weight loss, gain, night sweats, Fevers, chills, fatigue .  HEENT: headaches, Sore throat, sneezing, nasal congestion, post nasal drip, Difficulty swallowing, Tooth/dental problems, visual complaints visual changes, ear ache CV:  chest pain, radiates:,Orthopnea, PND, swelling in lower extremities, dizziness, palpitations, syncope.  GI  heartburn, indigestion, abdominal pain, nausea, vomiting, diarrhea, change in bowel habits, loss of appetite, bloody stools.  Resp: cough, productive: , hemoptysis, dyspnea, chest pain, pleuritic.  Skin: rash or itching or icterus GU: dysuria, change in color of urine,  urgency or frequency. flank pain, hematuria  MS: L shoulder pain joint pain or swelling. decreased range of motion  Psych: change in mood or affect. depression or anxiety.  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   Surgical History:   Past Surgical History:  Procedure Laterality Date   HERNIA REPAIR Right    LIVER SURGERY     PPM GENERATOR CHANGEOUT N/A 08/14/2017   Procedure: PPM GENERATOR CHANGEOUT;  Surgeon: Duke Salvia, MD;  Location: [facility]ASIVE [facility];  Service: Cardiovascular;  Laterality: N/A;     Social History:   reports that he has never smoked. His smokeless tobacco use includes chew. He reports that he does not drink alcohol and does not use drugs.   Family History:  His family history includes Arthritis in his father and mother; Depression in his mother; Diabetes in his mother.  Allergies Allergies  Allergen Reactions   Codeine Itching   Penicillins Hives    Has patient had a PCN reaction causing immediate rash, facial/tongue/throat swelling, SOB or lightheadedness with hypotension: unknown Has patient had a PCN reaction causing severe rash involving mucus membranes or skin necrosis: unknown Has patient had a PCN reaction that required hospitalization unknown Has patient had a PCN reaction occurring within the last 10 years: No If all of the above answers are "NO", then may proceed with Cephalosporin use.      Home Medications  Prior to Admission medications   Medication Sig Start Date End Date Taking? Authorizing Provider  ammonium lactate (AMLACTIN) 12 % cream Apply 1  Application topically as needed for dry skin. 11/07/22   Vivi Barrack, DPM  atenolol (TENORMIN) 50 MG tablet Take 1 tablet (50 mg total) by mouth daily. 05/21/22   Plotnikov, Georgina Quint, MD  Cholecalciferol (VITAMIN D3) 2000 UNITS capsule Take 1 capsule (2,000 Units total) by mouth daily. 12/19/14   Plotnikov, Georgina Quint, MD  citalopram (CELEXA) 40 MG tablet Take 1 tablet (40 mg total) by mouth daily. Must keep 05/21/22 appt for future refills 04/28/22   Plotnikov, Georgina Quint, MD  clonazePAM (KLONOPIN) 1 MG tablet TAKE 1 TABLET BY MOUTH TWICE A DAY 11/19/22   Plotnikov, Georgina Quint, MD  Continuous Blood Gluc Sensor (FREESTYLE LIBRE 3 SENSOR) MISC 1 Units by Does not apply route every 14 (fourteen) days. 11/21/21   Plotnikov, Georgina Quint, MD  ibuprofen (ADVIL,MOTRIN) 200 MG tablet Take 200-400 mg by mouth every 6 (six) hours as needed for headache or moderate pain.     [provider]  metFORMIN (GLUCOPHAGE) 500 MG tablet Take 1 tablet (500 mg total) by mouth 2 (two) times daily with a meal. 09/24/22   Plotnikov, Georgina Quint, MD  Methadone HCl (DOLOPHINE PO) Take 130 mg by mouth daily.    [provider]  mupirocin ointment (BACTROBAN) 2 % On leg wound w/dressing change qd or bid 09/23/22   Plotnikov, Georgina Quint, MD  silver sulfADIAZINE (SILVADENE) 1 % cream Apply 1 Application topically daily. 10/07/22   Vivi Barrack, DPM  triamcinolone cream (KENALOG) 0.1 % Apply 1 application topically 3 (three) times daily. 02/04/21   Plotnikov, Georgina Quint, MD  valACYclovir (VALTREX) 1000 MG tablet Take 1 tablet (1,000 mg total) by mouth 3 (three) times daily. 05/21/22   Plotnikov, Georgina Quint, MD  Vitamin D, Ergocalciferol, (DRISDOL) 1.25 MG (50000 UNIT) CAPS capsule Take 1 capsule (50,000 Units total) by mouth every 7 (seven) days. 09/24/22   Plotnikov, Georgina Quint, MD     Critical care time: 39 minutes     [name], AGACNP-BC [facility]  See Amion for personal pager PCCM on  call pager [phone] until 7pm. Please call Elink 7p-7a. [phone]  12/18/2022 1:20 PM

## 2022-12-18 NOTE — ED Provider Notes (Signed)
[place] EMERGENCY DEPARTMENT AT [facility] Provider Note   CSN: [id number] Arrival date & time: 12/18/22  0222     History  Chief Complaint  Patient presents with   Fatigue    Jeffrey [name] is a 57 y.o. male.  The history is provided by the patient and medical records.  Jeffrey [name] is a 57 y.o. male who presents to the Emergency Department complaining of fatigue.  He presents to the ED by EMS for feeling unwell for two nights.  He has fatigue and feels like he is in a "hump."  Has some headache. No abdominal pain. No N/V.  No cough. No dysuria.  Has not slept well.  No known sick contacts.  Has a hx/o pacemaker for long QT syndrome. Hx/o DM.  Has chronic pain on methadone.  No IVDA.  No dental procedures or wounds.      Home Medications Prior to Admission medications   Medication Sig Start Date End Date Taking? Authorizing Provider  ammonium lactate (AMLACTIN) 12 % cream Apply 1 Application topically as needed for dry skin. 11/07/22   Vivi Barrack, DPM  atenolol (TENORMIN) 50 MG tablet Take 1 tablet (50 mg total) by mouth daily. 05/21/22   Plotnikov, Georgina Quint, MD  Cholecalciferol (VITAMIN D3) 2000 UNITS capsule Take 1 capsule (2,000 Units total) by mouth daily. 12/19/14   Plotnikov, Georgina Quint, MD  citalopram (CELEXA) 40 MG tablet Take 1 tablet (40 mg total) by mouth daily. Must keep 05/21/22 appt for future refills 04/28/22   Plotnikov, Georgina Quint, MD  clonazePAM (KLONOPIN) 1 MG tablet TAKE 1 TABLET BY MOUTH TWICE A DAY 11/19/22   Plotnikov, Georgina Quint, MD  Continuous Blood Gluc Sensor (FREESTYLE LIBRE 3 SENSOR) MISC 1 Units by Does not apply route every 14 (fourteen) days. 11/21/21   Plotnikov, Georgina Quint, MD  ibuprofen (ADVIL,MOTRIN) 200 MG tablet Take 200-400 mg by mouth every 6 (six) hours as needed for headache or moderate pain.     [provider]  metFORMIN (GLUCOPHAGE) 500 MG tablet Take 1 tablet (500 mg total) by mouth 2 (two) times daily with  a meal. 09/24/22   Plotnikov, Georgina Quint, MD  Methadone HCl (DOLOPHINE PO) Take 130 mg by mouth daily.    [provider]  mupirocin ointment (BACTROBAN) 2 % On leg wound w/dressing change qd or bid 09/23/22   Plotnikov, Georgina Quint, MD  silver sulfADIAZINE (SILVADENE) 1 % cream Apply 1 Application topically daily. 10/07/22   Vivi Barrack, DPM  triamcinolone cream (KENALOG) 0.1 % Apply 1 application topically 3 (three) times daily. 02/04/21   Plotnikov, Georgina Quint, MD  valACYclovir (VALTREX) 1000 MG tablet Take 1 tablet (1,000 mg total) by mouth 3 (three) times daily. 05/21/22   Plotnikov, Georgina Quint, MD  Vitamin D, Ergocalciferol, (DRISDOL) 1.25 MG (50000 UNIT) CAPS capsule Take 1 capsule (50,000 Units total) by mouth every 7 (seven) days. 09/24/22   Plotnikov, Georgina Quint, MD      Allergies    Codeine and Penicillins    Review of Systems   Review of Systems  All other systems reviewed and are negative.   Physical Exam Updated Vital Signs BP 120/72 (BP Location: Left Arm)   Pulse (!) 121   Temp (!) 100.7 F (38.2 C) (Oral)   Resp 16   Ht 6\' 1"  (1.854 m)   Wt 117.9 kg   SpO2 94%   BMI 34.30 kg/m  Physical Exam Vitals and nursing  note reviewed.  Constitutional:      Appearance: He is well-developed.     Comments: Drowsy but conversant  HENT:     Head: Normocephalic and atraumatic.  Cardiovascular:     Rate and Rhythm: Regular rhythm. Tachycardia present.     Heart sounds: No murmur heard. Pulmonary:     Effort: Pulmonary effort is normal. No respiratory distress.     Breath sounds: Normal breath sounds.  Abdominal:     Palpations: Abdomen is soft.     Tenderness: There is no abdominal tenderness. There is no guarding or rebound.  Musculoskeletal:        General: No tenderness.     Comments: Nonpitting edema to BLE. 2+ DP pulses bilaterally  Skin:    General: Skin is warm and dry.  Neurological:     Mental Status: He is alert and oriented to person, place, and time.      Comments: 5/5 strength in all four extremities.   Psychiatric:        Behavior: Behavior normal.     ED Results / Procedures / Treatments   Labs (all labs ordered are listed, but only abnormal results are displayed) Labs Reviewed  I-STAT CG4 LACTIC ACID, ED - Abnormal; Notable for the following components:      Result Value   Lactic Acid, Venous 2.9 (*)    All other components within normal limits  CULTURE, BLOOD (ROUTINE X 2)  CULTURE, BLOOD (ROUTINE X 2)  RESP PANEL BY RT-PCR (RSV, FLU A&B, COVID)  [id number]  COMPREHENSIVE METABOLIC PANEL  CBC WITH DIFFERENTIAL/PLATELET  PROTIME-INR  URINALYSIS, W/ REFLEX TO CULTURE (INFECTION SUSPECTED)    EKG EKG Interpretation Date/Time:  Thursday December 18 2022 02:34:48 EDT Ventricular Rate:  120 PR Interval:  143 QRS Duration:  112 QT Interval:  315 QTC Calculation: 445 R Axis:   249  Text Interpretation: Sinus tachycardia Incomplete RBBB and LAFB Low voltage, precordial leads Consider anterior infarct ST elevation, consider inferior injury Baseline wander in lead(s) III aVF V3 V5 V6 Confirmed by Tilden Fossa [phone]) on 12/18/2022 2:41:50 AM  Radiology No results found.  Procedures Procedures    Medications Ordered in ED Medications - No data to display  ED Course/ Medical Decision Making/ A&P                                 Medical Decision Making Amount and/or Complexity of Data Reviewed Labs: ordered. Radiology: ordered.  Risk OTC drugs. Prescription drug management.   Pt with  hx/o DM, long QT s/p ppm here for evaluation of fever for two days.  He is nontoxic appearing on evaluation, febrile and tachycardic.  CXR without pneumonia.  BMP with hyponatremia.  Lactic acid is elevated.  Unclear source of fever at this time, pt started on empiric abx for UTI pending additional information.   UA is not fully consistent with UTI.  Plan to obtain CT C/A/P to evaluate for additional process such as infected stone or  occult pneumonia.  Pt care transferred pending additional imaging.           Final Clinical Impression(s) / ED Diagnoses Final diagnoses:  None    Rx / DC Orders ED Discharge Orders     None         Tilden Fossa, MD 12/18/22 [phone]

## 2022-12-19 ENCOUNTER — Other Ambulatory Visit (HOSPITAL_COMMUNITY): Payer: MEDICAID

## 2022-12-19 ENCOUNTER — Inpatient Hospital Stay (HOSPITAL_COMMUNITY): Payer: MEDICAID

## 2022-12-19 ENCOUNTER — Ambulatory Visit: Payer: Medicaid Other | Admitting: Podiatry

## 2022-12-19 DIAGNOSIS — N179 Acute kidney failure, unspecified: Secondary | ICD-10-CM

## 2022-12-19 DIAGNOSIS — R509 Fever, unspecified: Secondary | ICD-10-CM | POA: Diagnosis not present

## 2022-12-19 DIAGNOSIS — R6520 Severe sepsis without septic shock: Secondary | ICD-10-CM | POA: Diagnosis not present

## 2022-12-19 DIAGNOSIS — R6521 Severe sepsis with septic shock: Secondary | ICD-10-CM | POA: Diagnosis not present

## 2022-12-19 DIAGNOSIS — B9561 Methicillin susceptible Staphylococcus aureus infection as the cause of diseases classified elsewhere: Secondary | ICD-10-CM | POA: Diagnosis not present

## 2022-12-19 DIAGNOSIS — R578 Other shock: Secondary | ICD-10-CM | POA: Diagnosis not present

## 2022-12-19 DIAGNOSIS — D72829 Elevated white blood cell count, unspecified: Secondary | ICD-10-CM | POA: Diagnosis not present

## 2022-12-19 DIAGNOSIS — A419 Sepsis, unspecified organism: Secondary | ICD-10-CM | POA: Diagnosis not present

## 2022-12-19 LAB — CBC
HCT: 35.4 % — ABNORMAL LOW (ref 39.0–52.0)
Hemoglobin: 11.7 g/dL — ABNORMAL LOW (ref 13.0–17.0)
MCH: 28.9 pg (ref 26.0–34.0)
MCHC: 33.1 g/dL (ref 30.0–36.0)
MCV: 87.4 fL (ref 80.0–100.0)
Platelets: 109 10*3/uL — ABNORMAL LOW (ref 150–400)
RBC: 4.05 MIL/uL — ABNORMAL LOW (ref 4.22–5.81)
RDW: 13.2 % (ref 11.5–15.5)
WBC: 18.2 10*3/uL — ABNORMAL HIGH (ref 4.0–10.5)
nRBC: 0 % (ref 0.0–0.2)

## 2022-12-19 LAB — ECHOCARDIOGRAM COMPLETE
Height: 73 in
S' Lateral: 2.1 cm
Weight: 3933.01 [oz_av]

## 2022-12-19 LAB — LACTIC ACID, PLASMA: Lactic Acid, Venous: 3 mmol/L (ref 0.5–1.9)

## 2022-12-19 LAB — BASIC METABOLIC PANEL
Anion gap: 8 (ref 5–15)
BUN: 23 mg/dL — ABNORMAL HIGH (ref 6–20)
CO2: 19 mmol/L — ABNORMAL LOW (ref 22–32)
Calcium: 6.8 mg/dL — ABNORMAL LOW (ref 8.9–10.3)
Chloride: 103 mmol/L (ref 98–111)
Creatinine, Ser: 1.32 mg/dL — ABNORMAL HIGH (ref 0.61–1.24)
GFR, Estimated: >60 mL/min (ref >60)
Glucose, Bld: 269 mg/dL — ABNORMAL HIGH (ref 70–99)
Potassium: 3.7 mmol/L (ref 3.5–5.1)
Sodium: 130 mmol/L — ABNORMAL LOW (ref 135–145)

## 2022-12-19 LAB — GLUCOSE, CAPILLARY
Glucose-Capillary: 239 mg/dL — ABNORMAL HIGH (ref 70–99)
Glucose-Capillary: 294 mg/dL — ABNORMAL HIGH (ref 70–99)
Glucose-Capillary: 240 mg/dL — ABNORMAL HIGH (ref 70–99)
Glucose-Capillary: 253 mg/dL — ABNORMAL HIGH (ref 70–99)

## 2022-12-19 LAB — PHOSPHORUS: Phosphorus: 3 mg/dL (ref 2.5–4.6)

## 2022-12-19 LAB — MAGNESIUM: Magnesium: 2 mg/dL (ref 1.7–2.4)

## 2022-12-19 MED ORDER — INSULIN GLARGINE-YFGN 100 UNIT/ML ~~LOC~~ SOLN
11.0000 [IU] | Freq: Every day | SUBCUTANEOUS | Status: DC
  Administered 2022-12-19 – 2022-12-24 (×6): 11 [IU] via SUBCUTANEOUS
  Filled 2022-12-19 (×7): qty 0.11

## 2022-12-19 MED ORDER — LACTATED RINGERS IV SOLN: INTRAVENOUS | Status: DC

## 2022-12-19 NOTE — Plan of Care (Signed)

## 2022-12-19 NOTE — Progress Notes (Signed)
NAME:  Jeffrey [name], MRN:  [id number], DOB:  1965-11-04, LOS: 1 ADMISSION DATE:  12/18/2022, CONSULTATION DATE: 9/26 REFERRING MD: Dr. Kirby Crigler, CHIEF COMPLAINT: Hypotension  History of Present Illness:  57 year old male with past medical history as below, which is significant for PTSD, opioid dependence on methadone therapy for 30 years, prediabetes, tremor, and right foot ulceration.  He was in his usual state of health until 9/25 when he developed profound weakness and reported fevers.  Complains of diffuse joint pain but no other significant complaints.  Upon arrival to the emergency department he was febrile and tachycardic.  Infectious workup including labs and CT chest abdomen pelvis were without obvious source.  He was admitted to the hospital service, however, shortly after he became hypotensive refractory to IV fluids and was started on norepinephrine infusion.  PCCM was asked to evaluate for ICU admission.  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   Significant Hospital Events: Including procedures, antibiotic start and stop dates in addition to other pertinent events   9/26 admitted for ? Sepsis, became hypotensive. NE started. Transfer to ICU.  9/26 BCID showing MSSA > Narrowed to ancef  Interim History / Subjective:  No complaints this morning, he feels more "with it" than he did yesterday Remains on levo 14 and vaso S/p multiple liters of IV fluid.  Lactic finally trending down  Objective   Blood pressure (!) 112/52, pulse 80, temperature 100.3 F (37.9 C), temperature source Axillary, resp. rate 20, height 6\' 1"  (1.854 m), weight 111.5 kg, SpO2  95%. CVP:  [8 mmHg-24 mmHg] 9 mmHg      Intake/Output Summary (Last 24 hours) at 12/19/2022 0917 Last data filed at 12/19/2022 0800 Gross per 24 hour  Intake 6242.49 ml  Output 1250 ml  Net 4992.49 ml   Filed Weights   12/18/22 0231 12/18/22 1305  Weight: 117.9 kg 111.5 kg    Examination: General: overweight middle aged male in NAD HENT: [place]/AT, PERRL, no JVD. Mucous membranes very dry. Edentulous. Lungs: Clear bilateral breath sounds Cardiovascular: RRR, no MRG Abdomen: Soft, NT, ND Extremities: No acute deformity or ROM limitation Neuro: Alert, oriented, non-focal  Resolved Hospital Problem list     Assessment & Plan:   Septic shock secondary to MSSA bacteremia. Source maybe his foot wound?  - NE and vaso for MAP goal 65 mmHg - ABX narrowed to cefazolin - Echocardiogram pending - Blood cultures pending - ID consult pending.  - Pacer wires are a concern.   DM - on metformin at home. A1C 11.6 Hyperglycemia - holding metformin  - CBG monitoring and SSI  Lactic acidosis > clearing - hemodynamic support  AKI - improving  Opioid dependence - continue home methadone 130mg  daily  Long QT - avoid QT prolonging meds - Mag goal > 2  Anxiety - continue citalopram and clonazepam   Best Practice (right click and "Reselect all SmartList Selections" daily)   Diet/type: Regular consistency (see orders) DVT prophylaxis: LMWH GI prophylaxis: N/A Lines: N/A Foley:  N/A Code Status:  full code Last date of multidisciplinary goals of care discussion [ ]   Critical care time: 40 minutes     [name], AGACNP-BC [facility]  See Amion for personal pager PCCM on call pager [phone] until 7pm. Please call Elink 7p-7a. [phone]  12/19/2022 9:17 AM

## 2022-12-19 NOTE — Consult Note (Addendum)
[facility]    Date of Admission:  12/18/2022   Total days of inpatient antibiotics 1        Reason for Consult: MSSA bacteremia    Principal Problem:   Sepsis (HCC) Active Problems:   Shock (HCC)   Hypovolemia   Assessment: 57 year old male admitted with: #MSSA bacteremia in the setting of pacemaker #History of long QT syndrome requiring PPM #Fever/leukocytosis secondary to #1 - Patient presented with fatigue x 2 days found to have temp 100.8, no leukocytosis on admission.  Blood cultures grew MSSA, WBC 18K today. - No lines in place, no recent hospitalizations - No GI symptoms.  Essentially symptoms are fatigue, fever. - Source is unclear but he does have a pacemaker which officially does not show signs of infection.  Reviewed Dr. Odessa Fleming note on 08/05/2017: Pacemaker implanted at [facility] in 1993 following syncopal sinus node dysfunction, diagnosed with long QT syndrome at the time.  Generator replacement at [facility], [employer] 2006. - Given MSSA in the setting of pacemaker would reengage EP extraction Recommendations:  -Follow repeat blood cultures to ensure clearance from 9/26 - Continue cefazolin - TTE without vegetation noted - Will need TEE, plan per primary plan on transfer to [facility] once off of pressors to eat for EP evaluation and hopefully pacemaker removal.  #History of opioid addiction, patient denies IVDA - Patient has been on methadone for years - Last use per patient was over 20 years ago - Given patient struggles with opioid addiction I do not think he is PICC line candidate due to risk of relapse although it seems he has abstained.   #Uncontrolled diabetes - A1c 11.6 on 12/18/2022 - Management per primary  #PCN allergy-hives -Tolerating cefazolin  Dr. Algis Liming will be reviewed this weekend, I will be back on service on Monday. Microbiology:   Antibiotics: Cefazolin 9/26-present Terri Malerba Pentam 9/26 Vancomycin  9/23 Aztreonam 9/25  Cultures: Blood 9/26 2/2 MSSA 9/27 pending   HPI: Jeffrey [name] is a 57 y.o. male with history of long QT syndrome requiring pacemaker at the age of around 78, notes battery exchange recently, opioid dependence, PTSD, diabetes mellitus non-insulin-dependent, obesity admitted with sepsis secondary to MSSA.  Patient had presented with fatigue x 2 days.  He had temp 100.8 in the ED.  Blood cultures grew MSSA.  ID engaged   Review of Systems: Review of Systems  All other systems reviewed and are negative.   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, [address], serial # [id number]. [employer] rep must physically interrogate pacemaker.    PTSD (post-traumatic stress disorder) 12/19/2014   On Klonopin, Celexa  Potential benefits of a long term benzodiazepines  use as well as potential risks  and complications were explained to the patient and were aknowledged.    Skin abscess 03/09/2015   11/16 - small    Substance abuse (HCC)    opioids   Tremor 07/17/2016   ?etiology     Social  History   Tobacco Use   Smoking status: Never   Smokeless tobacco: Current    Types: Chew  Vaping Use   Vaping status: Never Used  Substance Use Topics   Alcohol use: No    Alcohol/week: 0.0 standard drinks of alcohol   Drug use: No    Comment: methadone clinic    Family History  Problem Relation Age of Onset   Depression Mother    Diabetes Mother    Arthritis Mother    Arthritis Father    Scheduled Meds:  Chlorhexidine Gluconate Cloth  6 each Topical Daily   citalopram  40 mg Oral Daily   clonazePAM  1 mg Oral  BID   enoxaparin (LOVENOX) injection  40 mg Subcutaneous Daily   insulin aspart  0-15 Units Subcutaneous TID WC   insulin aspart  0-5 Units Subcutaneous QHS   insulin glargine-yfgn  11 Units Subcutaneous Daily   methadone  130 mg Oral Daily   Continuous Infusions:  sodium chloride Stopped (12/18/22 1346)   sodium chloride     sodium chloride 10 mL/hr at 12/19/22 1200    ceFAZolin (ANCEF) IV Stopped (12/19/22 0556)   norepinephrine (LEVOPHED) Adult infusion 13 mcg/min (12/19/22 1200)   vasopressin 0.03 Units/min (12/19/22 1200)   PRN Meds:.Place/Maintain arterial line **AND** sodium chloride, sodium chloride, acetaminophen **OR** acetaminophen, albuterol, mouth rinse, traZODone Allergies  Allergen Reactions   Codeine Itching   Penicillins Hives    Has patient had a PCN reaction causing immediate rash, facial/tongue/throat swelling, SOB or lightheadedness with hypotension: unknown Has patient had a PCN reaction causing severe rash involving mucus membranes or skin necrosis: unknown Has patient had a PCN reaction that required hospitalization unknown Has patient had a PCN reaction occurring within the last 10 years: No If all of the above answers are "NO", then may proceed with Cephalosporin use.     OBJECTIVE: Blood pressure (!) 118/46, pulse 77, temperature 98.7 F (37.1 C), temperature source Oral, resp. rate 19, height 6\' 1"  (1.854 m), weight 111.5 kg, SpO2 94%.  Physical Exam Constitutional:      General: He is not in acute distress.    Appearance: He is normal weight. He is not toxic-appearing.  HENT:     Head: Normocephalic and atraumatic.     Right Ear: External ear normal.     Left Ear: External ear normal.     Nose: No congestion or rhinorrhea.     Mouth/Throat:     Mouth: Mucous membranes are moist.     Pharynx: Oropharynx is clear.  Eyes:     Extraocular Movements: Extraocular movements intact.     Conjunctiva/sclera: Conjunctivae normal.     Pupils: Pupils are  equal, round, and reactive to light.  Cardiovascular:     Rate and Rhythm: Normal rate and regular rhythm.     Heart sounds: No murmur heard.    No friction rub. No gallop.     Comments: PPM without tenderness Pulmonary:     Effort: Pulmonary effort is normal.     Breath sounds: Normal breath sounds.  Abdominal:     General: Abdomen is flat. Bowel sounds are normal.     Palpations: Abdomen is soft.  Musculoskeletal:        General: No swelling. Normal range of motion.     Cervical back: Normal range of motion and neck supple.  Skin:    General: Skin is warm and dry.  Neurological:     General: No focal deficit  present.     Mental Status: He is oriented to person, place, and time.  Psychiatric:        Mood and Affect: Mood normal.     Lab Results Lab Results  Component Value Date   WBC 18.2 (H) 12/19/2022   HGB 11.7 (L) 12/19/2022   HCT 35.4 (L) 12/19/2022   MCV 87.4 12/19/2022   PLT 109 (L) 12/19/2022    Lab Results  Component Value Date   CREATININE 1.32 (H) 12/19/2022   BUN 23 (H) 12/19/2022   NA 130 (L) 12/19/2022   K 3.7 12/19/2022   CL 103 12/19/2022   CO2 19 (L) 12/19/2022    Lab Results  Component Value Date   ALT 32 12/18/2022   AST 69 (H) 12/18/2022   ALKPHOS 46 12/18/2022   BILITOT 1.6 (H) 12/18/2022       Danelle Earthly, MD [facility] 12/19/2022, 1:21 PM

## 2022-12-19 NOTE — Plan of Care (Signed)
  Problem: Education: Goal: Knowledge of General Education information will improve Description: Including pain rating scale, medication(s)/side effects and non-pharmacologic comfort measures Outcome: Progressing   Problem: Health Behavior/Discharge Planning: Goal: Ability to manage health-related needs will improve Outcome: Progressing   Problem: Clinical Measurements: Goal: Ability to maintain clinical measurements within normal limits will improve Outcome: Progressing Goal: Diagnostic test results will improve Outcome: Progressing Goal: Respiratory complications will improve Outcome: Progressing Goal: Cardiovascular complication will be avoided Outcome: Progressing   Problem: Coping: Goal: Level of anxiety will decrease Outcome: Progressing

## 2022-12-19 NOTE — Inpatient Diabetes Management (Addendum)
Inpatient Diabetes Program Recommendations  AACE/ADA: New Consensus Statement on Inpatient Glycemic Control (2015)  Target Ranges:  Prepandial:   less than 140 mg/dL      Peak postprandial:   less than 180 mg/dL (1-2 hours)      Critically ill patients:  140 - 180 mg/dL    Latest Reference Range & Units 09/23/22 09:25 12/18/22 13:58  Hemoglobin A1C 4.8 - 5.6 % 9.5 (H) 11.6 (H)  286 mg/dl  (H): Data is abnormally high  Latest Reference Range & Units 12/18/22 13:12 12/18/22 16:10 12/18/22 21:40 12/19/22 07:55  Glucose-Capillary 70 - 99 mg/dL 161 (H)  5 units Novolog  227 (H)  5 units Novolog  219 (H)  2 units Novolog  239 (H)  5 units Novolog   (H): Data is abnormally high   Admit with: Shock/ Sepsis/ Lactic Acidosis  History: DM, PTSD, Opioid dependence on methadone therapy for 30 years   Home DM Meds: Metformin 500 mg BID (NOT taking?)       Freestyle Libre 3 CGM  Current Orders: Novolog Moderate Correction Scale/ SSI (0-15 units) TID AC + HS   MD- Note CBG 239 this AM  Please consider starting Semglee 11 units Daily (0.1 units/kg)  May need Insulin for home given A1c rose from 9.5% to 11.6% since July  Also, given elevated Lactic Acid on admission, may not want to restart Metformin when pt discharged    PCP: Dr. Posey Rea with [facility] Last Seen 09/23/2022 Was Not taking Metformin at that visit Given Rx to restart Metformin 500 mg BID   Addendum 11:am--Met w/ pt at bedside in the ICU.  Pt was A&O and able to hold meaningful conversation.  Pt confirmed that he saw his PCP Dr. Posey Rea in July and that the PCP re-prescribed Metformin.  Pt told me he took it for about 2 weeks and then stopped b/c he "felt better"  Only checking CBGs twice per week and stated he never saw any CBGs >300 but did see CBGs in the 200s.  Helps his elderly parents with their meds and helps his Mom take Levemir insulin pen injections on occasion.  Has never taken Insulin himself.   Recognized the insulin pen when I showed him one.  Pt did also tell me he has a poor diet--Eats large bowls of ice cream 3 times per week and drinks lots of beverages with sugar (sweet tea, regular soda, gatorade, fruit juice).  Spoke with patient about his current A1c of 11.6% and how it has risen since July (9.5%).  Explained what an A1c is and what it measures.  Reminded patient that his goal A1c is 7% or less per ADA standards to prevent both acute and long-term complications.  Explained to patient the extreme importance of good glucose control at home to prevent further infections and other vascular complications.  Encouraged patient to check his CBGs at least TID AC at home and to record all CBGs in a logbook for his  PCP to review.  Reviewed home CBG goals with pt.  Discussed with pt that he may need insulin when he goes home--pt open to taking insulin if needed.  Explained to pt that we have him on Novolog and Semglee insulins--explained what each insulin is and how we are giving.  Pt agreeable.  Also discussed DM diet information with patient.  Encouraged patient to avoid beverages with sugar (regular soda, sweet tea, lemonade, fruit juice) and to consume mostly water or diet drinks/ unsweet tea.  Discussed what foods contain carbohydrates and how carbohydrates affect the body's blood sugar levels.  Encouraged patient to be careful with his portion sizes (especially grains, starchy vegetables, and fruits).       --Will follow patient during hospitalization--  Ambrose Finland RN, MSN, CDCES Diabetes Coordinator Inpatient Glycemic Control Team Team Pager: [phone] (8a-5p)

## 2022-12-20 ENCOUNTER — Encounter (HOSPITAL_COMMUNITY): Payer: Self-pay | Admitting: Pulmonary Disease

## 2022-12-20 DIAGNOSIS — R579 Shock, unspecified: Secondary | ICD-10-CM | POA: Diagnosis not present

## 2022-12-20 DIAGNOSIS — R7881 Bacteremia: Secondary | ICD-10-CM

## 2022-12-20 DIAGNOSIS — B9561 Methicillin susceptible Staphylococcus aureus infection as the cause of diseases classified elsewhere: Secondary | ICD-10-CM | POA: Diagnosis not present

## 2022-12-20 LAB — CBC
HCT: 33.4 % — ABNORMAL LOW (ref 39.0–52.0)
Hemoglobin: 11.2 g/dL — ABNORMAL LOW (ref 13.0–17.0)
MCH: 29.4 pg (ref 26.0–34.0)
MCHC: 33.5 g/dL (ref 30.0–36.0)
MCV: 87.7 fL (ref 80.0–100.0)
Platelets: 87 10*3/uL — ABNORMAL LOW (ref 150–400)
RBC: 3.81 MIL/uL — ABNORMAL LOW (ref 4.22–5.81)
RDW: 13.6 % (ref 11.5–15.5)
WBC: 8.8 10*3/uL (ref 4.0–10.5)
nRBC: 0 % (ref 0.0–0.2)

## 2022-12-20 LAB — BASIC METABOLIC PANEL
Anion gap: 7 (ref 5–15)
BUN: 22 mg/dL — ABNORMAL HIGH (ref 6–20)
CO2: 22 mmol/L (ref 22–32)
Calcium: 7.1 mg/dL — ABNORMAL LOW (ref 8.9–10.3)
Chloride: 102 mmol/L (ref 98–111)
Creatinine, Ser: 0.99 mg/dL (ref 0.61–1.24)
GFR, Estimated: >60 mL/min (ref >60)
Glucose, Bld: 252 mg/dL — ABNORMAL HIGH (ref 70–99)
Potassium: 3.6 mmol/L (ref 3.5–5.1)
Sodium: 131 mmol/L — ABNORMAL LOW (ref 135–145)

## 2022-12-20 LAB — GLUCOSE, CAPILLARY
Glucose-Capillary: 253 mg/dL — ABNORMAL HIGH (ref 70–99)
Glucose-Capillary: 249 mg/dL — ABNORMAL HIGH (ref 70–99)
Glucose-Capillary: 148 mg/dL — ABNORMAL HIGH (ref 70–99)
Glucose-Capillary: 147 mg/dL — ABNORMAL HIGH (ref 70–99)

## 2022-12-20 LAB — MAGNESIUM: Magnesium: 2.2 mg/dL (ref 1.7–2.4)

## 2022-12-20 LAB — BLOOD GAS, ARTERIAL
Drawn by: 56037
Patient temperature: 36.8

## 2022-12-20 LAB — CULTURE, BLOOD (ROUTINE X 2)

## 2022-12-20 LAB — PHOSPHORUS: Phosphorus: 1.8 mg/dL — ABNORMAL LOW (ref 2.5–4.6)

## 2022-12-20 MED ORDER — SODIUM PHOSPHATES 45 MMOLE/15ML IV SOLN
30.0000 mmol | Freq: Once | INTRAVENOUS | Status: AC
  Administered 2022-12-20: 30 mmol via INTRAVENOUS
  Filled 2022-12-20: qty 10

## 2022-12-20 MED ORDER — METHADONE HCL 10 MG PO TABS
20.0000 mg | ORAL_TABLET | Freq: Once | ORAL | Status: AC
  Administered 2022-12-20: 20 mg via ORAL
  Filled 2022-12-20: qty 2

## 2022-12-20 MED ORDER — METHADONE HCL 10 MG/ML PO CONC
150.0000 mg | Freq: Every day | ORAL | Status: DC
  Administered 2022-12-21 – 2023-01-02 (×13): 150 mg via ORAL
  Filled 2022-12-20 (×13): qty 15

## 2022-12-20 MED ORDER — METHADONE HCL 10 MG/ML PO CONC: 20.0000 mg | Freq: Once | ORAL | Status: DC

## 2022-12-20 MED ORDER — POTASSIUM CHLORIDE CRYS ER 20 MEQ PO TBCR
40.0000 meq | EXTENDED_RELEASE_TABLET | Freq: Once | ORAL | Status: AC
  Administered 2022-12-20: 40 meq via ORAL
  Filled 2022-12-20: qty 2

## 2022-12-20 NOTE — TOC Progression Note (Signed)
Transition of Care [facility]) - Progression Note    Patient Details  Name: Jeffrey [name] MRN: [id number] Date of Birth: 1965-03-29  Transition of [facility]) CM/SW Contact  Adrian Prows, RN Phone Number: 12/20/2022, 4:56 PM  Clinical Narrative:    TOC went to pt's room to complete assessment; he was currently on a phone call and ask if this RN, CM stop by tomorrow; will attempt to follow up w/ pt in AM.        Expected Discharge Plan and Services                                               Social Determinants of Health (SDOH) Interventions SDOH Screenings   Depression (PHQ2-9): Low Risk  (09/23/2022)  Tobacco Use: High Risk (12/20/2022)    Readmission Risk Interventions     No data to display

## 2022-12-20 NOTE — Progress Notes (Signed)
Electrolyte Replacement Na 131 K 3.6  Phos 1.8   Plan: 30mM Na phos & Kdur 40 po x 1 pre elink protocol  Herby Abraham, Pharm.D Use secure chat for questions 12/20/2022 12:07 PM

## 2022-12-20 NOTE — Progress Notes (Addendum)
NAME:  Jeffrey [name], MRN:  [id number], DOB:  12/16/1965, LOS: 2 ADMISSION DATE:  12/18/2022, CONSULTATION DATE: 9/26 REFERRING MD: Dr. Kirby Crigler, CHIEF COMPLAINT: Hypotension  History of Present Illness:  57 year old male with past medical history as below, which is significant for PTSD, opioid dependence on methadone therapy for 30 years, prediabetes, tremor, and right foot ulceration.  He was in his usual state of health until 9/25 when he developed profound weakness and reported fevers.  Complains of diffuse joint pain but no other significant complaints.  Upon arrival to the emergency department he was febrile and tachycardic.  Infectious workup including labs and CT chest abdomen pelvis were without obvious source.  He was admitted to the hospital service, however, shortly after he became hypotensive refractory to IV fluids and was started on norepinephrine infusion.  PCCM was asked to evaluate for ICU admission.  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   Significant Hospital Events: Including procedures, antibiotic start and stop dates in addition to other pertinent events   9/26 admitted for ? Sepsis, became hypotensive. NE started. Transfer to ICU.  9/26 BCID showing MSSA > Narrowed to ancef  Interim History / Subjective:  Offers no complaints He does have generalized discomfort but no specific concerns Remains on Levophed and vasopressin -Being weaned Objective   Blood pressure 131/68, pulse 67, temperature 97.7 F (36.5 C), temperature source Oral, resp. rate (!) 9, height 6\' 1"  (1.854 m), weight 111.5 kg, SpO2 95%. CVP:  [8 mmHg-15 mmHg] 12 mmHg       Intake/Output Summary (Last 24 hours) at 12/20/2022 1011 Last data filed at 12/20/2022 0800 Gross per 24 hour  Intake 2016.47 ml  Output --  Net 2016.47 ml   Filed Weights   12/18/22 0231 12/18/22 1305  Weight: 117.9 kg 111.5 kg    Examination: General: Middle-age, does not appear to be in distress HENT: Moist oral mucosa Lungs: Clear breath sounds bilaterally Cardiovascular: S 1 S2 appreciated Abdomen: Soft bowel sounds appreciated Extremities: No edema, no clubbing Neuro: Alert, oriented, non-focal  Resolved Hospital Problem list     Assessment & Plan:   Septic shock secondary to MSSA bacteremia -Narrowed to Ancef -Remains on pressors norepinephrine and vasopressin for MAP goal of 65 -Pacer wires remain a concern -Appreciate infectious disease input Diabetes Hyperglycemia -Metformin on hold -Continue SSI  Lactic acidosis -Continue with dynamic support -Lactic acidosis is clearing  AKI -Continue to trend -Maintain renal perfusion -Avoid nephrotoxic medication  Opioid dependence -Methadone 150 mg daily  Long QT -Avoid QT prolonging medications MAP goal of greater than 2  History of anxiety -Continue citalopram and clonazepam  Continue to wean pressors Once stable, plan will be to transfer to [facility] for EP evaluation  Best Practice (right click and "Reselect all SmartList Selections" daily)   Diet/type: Regular consistency (see orders) DVT prophylaxis: LMWH GI prophylaxis: N/A Lines: N/A Foley:  N/A Code Status:  full code Last date of multidisciplinary goals of care discussion [ ]   The patient is critically ill with multiple organ systems failure and requires high complexity decision making for assessment and support, frequent evaluation and titration of therapies, application of advanced monitoring technologies and extensive interpretation of multiple databases. Critical Care Time devoted to patient care services described in  this note independent  of APP/resident time (if applicable)  is 30 minutes.   Virl Diamond MD [facility] Personal pager: See Amion If unanswered, please page CCM On-call: #[phone]

## 2022-12-20 NOTE — Plan of Care (Signed)

## 2022-12-21 DIAGNOSIS — R579 Shock, unspecified: Secondary | ICD-10-CM | POA: Diagnosis not present

## 2022-12-21 LAB — GLUCOSE, CAPILLARY
Glucose-Capillary: 103 mg/dL — ABNORMAL HIGH (ref 70–99)
Glucose-Capillary: 137 mg/dL — ABNORMAL HIGH (ref 70–99)
Glucose-Capillary: 203 mg/dL — ABNORMAL HIGH (ref 70–99)
Glucose-Capillary: 118 mg/dL — ABNORMAL HIGH (ref 70–99)

## 2022-12-21 MED ORDER — FUROSEMIDE 10 MG/ML IJ SOLN
20.0000 mg | Freq: Once | INTRAMUSCULAR | Status: AC
  Administered 2022-12-21: 20 mg via INTRAVENOUS
  Filled 2022-12-21: qty 2

## 2022-12-21 MED ORDER — METOPROLOL TARTRATE 5 MG/5ML IV SOLN
2.5000 mg | Freq: Once | INTRAVENOUS | Status: DC
  Filled 2022-12-21: qty 5

## 2022-12-21 MED ORDER — AMLODIPINE BESYLATE 10 MG PO TABS
10.0000 mg | ORAL_TABLET | Freq: Every day | ORAL | Status: DC
  Administered 2022-12-21 – 2022-12-24 (×3): 10 mg via ORAL
  Filled 2022-12-21 (×4): qty 1

## 2022-12-21 NOTE — Evaluation (Signed)
Occupational Therapy Evaluation Patient Details Name: Jeffrey [name] MRN: [id number] DOB: 1965/06/02 Today's Date: 12/21/2022   History of Present Illness Patient is a 57 year old male who presented to the hospital on 9/25 with fevers and profound weakness. Patient was admitted with hypotension with pressors started, septic shock secondary to MSSA bacteremia, AKI. PMH: opioid dependence, diabetes, anxiety, PTSD.   Clinical Impression   Patient is a 57 year old male who was admitted for above. Patient  was living at home with parents who he was helping to take care of. Patient has a history of R knee pain with brace that he wears during difficult tasks. Patient was noted to have decreased functional activity tolerance, decreased endurance, decreased standing balance, decreased safety awareness, and decreased knowledge of AD/AE impacting participation in ADLs. Patient would continue to benefit from skilled OT services at this time while admitted and after d/c to address noted deficits in order to improve overall safety and independence in ADLs.        If plan is discharge home, recommend the following: Two people to help with walking and/or transfers;A lot of help with bathing/dressing/bathroom;Assist for transportation;Help with stairs or ramp for entrance    Functional Status Assessment  Patient has had a recent decline in their functional status and demonstrates the ability to make significant improvements in function in a reasonable and predictable amount of time.  Equipment Recommendations  Other (comment) (RW)       Precautions / Restrictions Precautions Precautions: Fall Restrictions Weight Bearing Restrictions: No      Mobility Bed Mobility Overal bed mobility: Needs Assistance Bed Mobility: Supine to Sit     Supine to sit: Mod assist     General bed mobility comments: patient needed A with advancement fo BLE to EOB with increased pain in RLE        Balance  Overall balance assessment: Needs assistance Sitting-balance support: Single extremity supported, Feet supported Sitting balance-Leahy Scale: Fair     Standing balance support: Bilateral upper extremity supported, During functional activity, Reliant on assistive device for balance Standing balance-Leahy Scale: Poor         ADL either performed or assessed with clinical judgement   ADL Overall ADL's : Needs assistance/impaired Eating/Feeding: Set up;Sitting   Grooming: Set up;Sitting;Minimal assistance   Upper Body Bathing: Minimal assistance;Sitting   Lower Body Bathing: Bed level;Maximal assistance   Upper Body Dressing : Minimal assistance;Sitting   Lower Body Dressing: Bed level;Maximal assistance   Toilet Transfer: +2 for physical assistance;+2 for safety/equipment;Moderate assistance;Stand-pivot;Rolling walker (2 wheels) Toilet Transfer Details (indicate cue type and reason): to recliner in room with increased time and cues for proper positioning. Toileting- Clothing Manipulation and Hygiene: Total assistance;Sit to/from stand               Vision   Vision Assessment?: No apparent visual deficits            Pertinent Vitals/Pain Pain Assessment Pain Assessment: Faces Faces Pain Scale: Hurts even more Pain Location: R knee Pain Descriptors / Indicators: Discomfort, Constant, Grimacing Pain Intervention(s): Monitored during session, Repositioned, Patient requesting pain meds-RN notified     Extremity/Trunk Assessment Upper Extremity Assessment Upper Extremity Assessment: Overall WFL for tasks assessed              Cognition Arousal: Alert Behavior During Therapy: WFL for tasks assessed/performed Overall Cognitive Status: Within Functional Limits for tasks assessed         General Comments: patient was plesant,  motivated and cooperative during session                Home Living Family/patient expects to be discharged to:: Private  residence Living Arrangements: Parent   Type of Home: House Home Access: Stairs to enter     Home Layout: One level     Additional Comments: patient reported living at home with parents who he helps take care of. 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      Prior Functioning/Environment Prior Level of Function : Independent/Modified Independent                        OT Problem List: Decreased safety awareness;Decreased activity tolerance;Impaired balance (sitting and/or standing);Decreased knowledge of use of DME or AE;Decreased knowledge of precautions;Pain      OT Treatment/Interventions: Self-care/ADL training;Therapeutic exercise;Therapeutic activities;Neuromuscular education;Energy conservation;DME and/or AE instruction;Balance training    OT Goals(Current goals can be found in the care plan section) Acute Rehab OT Goals Patient Stated Goal: to get stronger OT Goal Formulation: With patient Time For Goal Achievement: 01/04/23 Potential to Achieve Goals: Fair  OT Frequency: Min 2X/week    Co-evaluation PT/OT/SLP Co-Evaluation/Treatment: Yes Reason for Co-Treatment: For patient/therapist safety;To address functional/ADL transfers PT goals addressed during session: Mobility/safety with mobility OT goals addressed during session: ADL's and self-care      AM-PAC OT "6 Clicks" Daily Activity     Outcome Measure Help from another person eating meals?: None Help from another person taking care of personal grooming?: A Little Help from another person toileting, which includes using toliet, bedpan, or urinal?: Total Help from another person bathing (including washing, rinsing, drying)?: A Lot Help from another person to put on and taking off regular upper body clothing?: A Little Help from another person to put on and taking off regular lower body clothing?: A Lot 6 Click  Score: 15   End of Session Equipment Utilized During Treatment: Gait belt;Rolling walker (2 wheels) Nurse Communication: Mobility status  Activity Tolerance: Patient tolerated treatment well Patient left: in chair;with call bell/phone within reach;with chair alarm set  OT Visit Diagnosis: Unsteadiness on feet (R26.81);Other abnormalities of gait and mobility (R26.89);Muscle weakness (generalized) (M62.81);Pain                Time: 3329-5188 OT Time Calculation (min): 24 min Charges:  OT General Charges $OT Visit: 1 Visit OT Evaluation $OT Eval Moderate Complexity: 1 Mod  Sharry Beining OTR/L, MS Acute Rehabilitation Department Office# [phone]   Selinda Flavin 12/21/2022, 3:29 PM

## 2022-12-21 NOTE — Progress Notes (Signed)
NAME:  Jeffrey [name], MRN:  [id number], DOB:  1965-06-15, LOS: 3 ADMISSION DATE:  12/18/2022, CONSULTATION DATE: 9/26 REFERRING MD: Dr. Kirby Crigler, CHIEF COMPLAINT: Hypotension  History of Present Illness:  57 year old male with past medical history as below, which is significant for PTSD, opioid dependence on methadone therapy for 30 years, prediabetes, tremor, and right foot ulceration.  He was in his usual state of health until 9/25 when he developed profound weakness and reported fevers.  Complains of diffuse joint pain but no other significant complaints.  Upon arrival to the emergency department he was febrile and tachycardic.  Infectious workup including labs and CT chest abdomen pelvis were without obvious source.  He was admitted to the hospital service, however, shortly after he became hypotensive refractory to IV fluids and was started on norepinephrine infusion.  PCCM was asked to evaluate for ICU admission.  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   Significant Hospital Events: Including procedures, antibiotic start and stop dates in addition to other pertinent events   9/26 admitted for ? Sepsis, became hypotensive. NE started. Transfer to ICU.  9/26 BCID showing MSSA > Narrowed to ancef  Interim History / Subjective:  Some knee pain Blood pressures trending up Off pressors  Objective   Blood pressure 125/73, pulse 64, temperature 97.8 F (36.6 C), temperature source Oral, resp. rate 15, height 6\' 1"  (1.854 m), weight 111.5 kg, SpO2 97%. CVP:  [9 mmHg-17 mmHg] 17 mmHg      Intake/Output Summary (Last 24 hours) at 12/21/2022 0854 Last data filed at  12/21/2022 0500 Gross per 24 hour  Intake 2327.35 ml  Output 3050 ml  Net -722.65 ml   Filed Weights   12/18/22 0231 12/18/22 1305  Weight: 117.9 kg 111.5 kg    Examination: General: Middle-age, does not appear to be in distress HENT: Moist oral mucosa Lungs: Clear breath sounds bilaterally Cardiovascular: S1-S2 appreciated Abdomen: Bowel sounds appreciated Extremities: No edema, no clubbing Neuro: Alert, oriented, non-focal  I reviewed nursing notes, last 24 h vitals and pain scores, last 48 h intake and output, last 24 h labs and trends, and last 24 h imaging results.  Resolved Hospital Problem list     Assessment & Plan:   Septic shock secondary to MSSA bacteremia -Now on Ancef -Off pressors -Pacer wires remain a concern -Appreciate infectious disease input -To continue on Ancef  Diabetes Hyperglycemia -Continue SSI -Metformin on hold  Lactic acidosis clearing  AKI -Continue to maintain renal perfusion -Avoid nephrotoxic medications  Opioid dependence -On methadone 150 mg daily  Long QT syndrome -Avoid QT prolonging medications  History of anxiety -Continue escitalopram and clonazepam  Plan is to transfer to [facility] for EP evaluation   Best Practice (right click and "Reselect all SmartList Selections" daily)   Diet/type: Regular consistency (see orders) DVT prophylaxis: LMWH GI prophylaxis: N/A Lines: N/A Foley:  N/A Code Status:  full code Last date of multidisciplinary goals of care discussion [patient alert and interactive,]   The patient is critically ill with multiple organ systems failure and requires high complexity decision making for assessment and support, frequent evaluation and titration of therapies, application of advanced monitoring technologies and extensive interpretation of multiple databases. Critical Care Time devoted to patient care services described in this  note independent of APP/resident time (if applicable)  is 30 minutes.    Virl Diamond MD [facility] Personal pager: See Amion If unanswered, please page CCM On-call: [phone]

## 2022-12-21 NOTE — Evaluation (Addendum)
Physical Therapy Evaluation Patient Details Name: Jeffrey [name] MRN: [id number] DOB: 26-Jul-1965 Today's Date: 12/21/2022  History of Present Illness  Patient is a 57 year old male who presented to the hospital on 9/25 with fevers and profound weakness. Patient was admitted with hypotension with pressors started, septic shock secondary to MSSA bacteremia, AKI. PMH: opioid dependence, diabetes, anxiety, PTSD.  Clinical Impression  Pt admitted with above diagnosis.  Pt currently with functional limitations due to the deficits listed below (see PT Problem List). Pt will benefit from acute skilled PT to increase their independence and safety with mobility to allow discharge.  Pt very pleasant and happy to mobilize.  Pt reports not being able to get out of bed since admission (states he had a lot more lines until recently).  Pt requiring at least mod +2 assist to safely perform transfer to recliner.  Pt reporting right knee pain which is chronic in nature but states much worse, which he believes is due to inactivity however he also reports falling onto knees prior to admission.  RN was notified of right knee pain and pt request for tylenol.  Pt typically ambulatory without assistive device and lives with his parents.  He reports they requested he move in with them to assist with caregiving as needed.  Due to current pain, decreased mobility, increased assist level, patient would benefit from continued inpatient follow up therapy, <3 hours/day.       Vitals upon sitting in recliner: 116/90 mmHg, 98% on room air, 79 bpm, CVP 10-15    If plan is discharge home, recommend the following: A lot of help with walking and/or transfers;A lot of help with bathing/dressing/bathroom;Help with stairs or ramp for entrance;Assistance with cooking/housework;Assist for transportation   Can travel by private vehicle   No    Equipment Recommendations Rolling walker (2 wheels)  Recommendations for Other Services        Functional Status Assessment Patient has had a recent decline in their functional status and demonstrates the ability to make significant improvements in function in a reasonable and predictable amount of time.     Precautions / Restrictions Precautions Precautions: Fall Restrictions Weight Bearing Restrictions: No      Mobility  Bed Mobility Overal bed mobility: Needs Assistance Bed Mobility: Supine to Sit     Supine to sit: Mod assist     General bed mobility comments: patient needed A with advancement fo BLE to EOB with increased pain in R LE    Transfers Overall transfer level: Needs assistance Equipment used: Rolling walker (2 wheels) Transfers: Sit to/from Stand, Bed to chair/wheelchair/BSC Sit to Stand: Mod assist, +2 physical assistance   Step pivot transfers: +2 physical assistance, Min assist       General transfer comment: verbal cues for safety and hand placement, also cues for weight shifting; encouraged use of UEs through RW to assist with pain control (pt reports increased right knee pain)    Ambulation/Gait                  Stairs            Wheelchair Mobility     Tilt Bed    Modified Rankin (Stroke Patients Only)       Balance Overall balance assessment: Needs assistance Sitting-balance support: Single extremity supported, Feet supported Sitting balance-Leahy Scale: Fair     Standing balance support: Bilateral upper extremity supported, During functional activity, Reliant on assistive device for balance Standing balance-Leahy Scale: Poor  Pertinent Vitals/Pain Pain Assessment Pain Assessment: Faces Faces Pain Scale: Hurts even more Pain Location: R knee Pain Descriptors / Indicators: Discomfort, Constant, Grimacing Pain Intervention(s): Monitored during session, Repositioned, Patient requesting pain meds-RN notified (RN notified pt requesting tylenol for right knee pain)     Home Living Family/patient expects to be discharged to:: Private residence Living Arrangements: Parent   Type of Home: House Home Access: Stairs to enter   Secretary/administrator of Steps: 1   Home Layout: One level Home Equipment: None Additional Comments: patient reported living at home with parents who he helps take care of. 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    Prior Function Prior Level of Function : Independent/Modified Independent                     Extremity/Trunk Assessment   Upper Extremity Assessment Upper Extremity Assessment: Overall WFL for tasks assessed    Lower Extremity Assessment Lower Extremity Assessment: RLE deficits/detail RLE Deficits / Details: mostly appears WFL except knee, pt unable to perform LAQ in sitting due to pain (chronic pain at baseline however worse then his normal, he believes due to inactivity since admission)       Communication   Communication Communication: No apparent difficulties  Cognition Arousal: Alert Behavior During Therapy: WFL for tasks assessed/performed Overall Cognitive Status: Within Functional Limits for tasks assessed                                 General Comments: patient was plesant, motivated and cooperative during session        General Comments      Exercises     Assessment/Plan    PT Assessment Patient needs continued PT services  PT Problem List Decreased strength;Decreased range of motion;Decreased balance;Decreased mobility;Decreased knowledge of use of DME;Pain       PT Treatment Interventions DME instruction;Gait training;Balance training;Functional mobility training;Therapeutic activities;Therapeutic exercise;Patient/family education    PT Goals (Current goals can be found in the Care Plan section)  Acute Rehab PT Goals PT Goal Formulation: With patient Time For Goal  Achievement: 01/04/23 Potential to Achieve Goals: Good    Frequency Min 1X/week     Co-evaluation PT/OT/SLP Co-Evaluation/Treatment: Yes Reason for Co-Treatment: For patient/therapist safety;To address functional/ADL transfers PT goals addressed during session: Mobility/safety with mobility OT goals addressed during session: ADL's and self-care       AM-PAC PT "6 Clicks" Mobility  Outcome Measure Help needed turning from your back to your side while in a flat bed without using bedrails?: A Little Help needed moving from lying on your back to sitting on the side of a flat bed without using bedrails?: A Lot Help needed moving to and from a bed to a chair (including a wheelchair)?: A Lot Help needed standing up from a chair using your arms (e.g., wheelchair or bedside chair)?: A Lot Help needed to walk in hospital room?: A Lot Help needed climbing 3-5 steps with a railing? : A Lot 6 Click Score: 13    End of Session Equipment Utilized During Treatment: Gait belt Activity Tolerance: Patient tolerated treatment well Patient left: in chair;with call bell/phone within reach;with chair alarm set Nurse Communication: Mobility status PT Visit Diagnosis: Difficulty in walking, not elsewhere classified (R26.2);Pain Pain - Right/Left: Right Pain - part of body:  Knee    Time: 4540-9811 PT Time Calculation (min) (ACUTE ONLY): 18 min   Charges:   PT Evaluation $PT Eval Moderate Complexity: 1 Mod   PT General Charges $$ ACUTE PT VISIT: 1 Visit        Thomasene Mohair PT, DPT Physical Therapist Acute Rehabilitation Services Office: [phone]   Janan Halter [place] 12/21/2022, 4:01 PM

## 2022-12-21 NOTE — TOC Initial Note (Signed)
Transition of Care [facility]) - Initial/Assessment Note    Patient Details  Name: Jeffrey [name] MRN: [id number] Date of Birth: [birth year]  [facility]) CM/SW Contact:    Adrian Prows, RN Phone Number: 12/21/2022, 12:47 PM  Clinical Narrative:                 Sherron Monday w/ pt in room; pt says he is from home w/ his parents, and he plans to return at d/c; he has transportation and denies SDOH risks; pt identified POC Masaji Billups (father) [phone]; pt says he has reading glasses and dentures (upper/lower); he does not havd DME, HH services, or home oxygen; TOC will follow.  Expected Discharge Plan: Home/Self Care Barriers to Discharge: Continued Medical Work up   Patient Goals and CMS Choice Patient states their goals for this hospitalization and ongoing recovery are:: home          Expected Discharge Plan and Services   Discharge Planning Services: CM Consult                                          Prior Living Arrangements/Services   Lives with:: Parents Patient language and need for interpreter reviewed:: Yes Do you feel safe going back to the place where you live?: Yes      Need for Family Participation in Patient Care: No (Comment)   Current home services:  (n/a) Criminal Activity/Legal Involvement Pertinent to Current Situation/Hospitalization: No - Comment as needed  Activities of Daily Living      Permission Sought/Granted Permission sought to share information with : Case Manager Permission granted to share information with : Yes, Verbal Permission Granted  Share Information with NAME: Case Manager     Permission granted to share info w Relationship: Artrell Lawless (father) [phone]     Emotional Assessment Appearance:: Appears stated age Attitude/Demeanor/Rapport: Gracious Affect (typically observed): Accepting Orientation: : Oriented to Self, Oriented to Place, Oriented to  Time, Oriented to Situation Alcohol  / Substance Use: Not Applicable Psych Involvement: No (comment)  Admission diagnosis:  Sepsis (HCC) [A41.9] Shock (HCC) [R57.9] Patient Active Problem List   Diagnosis Date Noted   Sepsis (HCC) 12/18/2022   Shock (HCC) 12/18/2022   Hypovolemia 12/18/2022   Vitamin D deficiency 09/24/2022   Diabetic ulcer of right foot (HCC) 09/23/2022   Shingles rash 05/21/2022   Type 2 diabetes mellitus with hyperglycemia (HCC) 05/12/2021   Neoplasm of uncertain behavior of skin 02/04/2021   Sinusitis 09/02/2020   HTN (hypertension) 02/14/2020   Change in bowel habit 11/10/2019   Health care maintenance 08/10/2019   Obesity (BMI 35.0-39.9 without comorbidity) 11/22/2018   Cardiac pacemaker in situ 08/14/2017   Cerumen impaction 10/29/2016   Tremor 07/17/2016   Long Q-T syndrome 12/19/2014   PTSD (post-traumatic stress disorder) 12/19/2014   Opioid dependence (HCC) 12/19/2014   PCP:  Tresa Garter, MD Pharmacy:   [facility] [place] - [address] [place] [address] Phone: [phone] Fax: [phone]     Social Determinants of Health (SDOH) Social History: SDOH Screenings   Food Insecurity: No Food Insecurity (12/21/2022)  Housing: Low Risk  (12/21/2022)  Transportation Needs: No Transportation Needs (12/21/2022)  Utilities: Not At Risk (12/21/2022)  Depression (PHQ2-9): Low Risk  (09/23/2022)  Tobacco Use: High Risk (12/20/2022)   SDOH Interventions:  Food Insecurity Interventions: Intervention Not Indicated, Inpatient TOC Housing Interventions: Intervention Not Indicated, Inpatient TOC Transportation Interventions: Intervention Not Indicated, Inpatient TOC Utilities Interventions: Intervention Not Indicated, Inpatient TOC   Readmission Risk Interventions    12/21/2022   12:45 PM  Readmission Risk Prevention Plan  Transportation Screening Complete  PCP or Specialist Appt within 5-7 Days  Complete  Home Care Screening Complete  Medication Review (RN CM) Complete

## 2022-12-21 NOTE — Plan of Care (Signed)

## 2022-12-22 ENCOUNTER — Inpatient Hospital Stay (HOSPITAL_COMMUNITY): Payer: MEDICAID

## 2022-12-22 ENCOUNTER — Other Ambulatory Visit: Payer: Self-pay

## 2022-12-22 ENCOUNTER — Other Ambulatory Visit (HOSPITAL_COMMUNITY): Payer: Self-pay

## 2022-12-22 DIAGNOSIS — B9561 Methicillin susceptible Staphylococcus aureus infection as the cause of diseases classified elsewhere: Secondary | ICD-10-CM | POA: Diagnosis not present

## 2022-12-22 DIAGNOSIS — R6521 Severe sepsis with septic shock: Secondary | ICD-10-CM | POA: Diagnosis not present

## 2022-12-22 DIAGNOSIS — R6520 Severe sepsis without septic shock: Secondary | ICD-10-CM | POA: Diagnosis not present

## 2022-12-22 DIAGNOSIS — R509 Fever, unspecified: Secondary | ICD-10-CM | POA: Diagnosis not present

## 2022-12-22 DIAGNOSIS — D72829 Elevated white blood cell count, unspecified: Secondary | ICD-10-CM | POA: Diagnosis not present

## 2022-12-22 DIAGNOSIS — A419 Sepsis, unspecified organism: Secondary | ICD-10-CM | POA: Diagnosis not present

## 2022-12-22 LAB — GLUCOSE, CAPILLARY
Glucose-Capillary: 111 mg/dL — ABNORMAL HIGH (ref 70–99)
Glucose-Capillary: 120 mg/dL — ABNORMAL HIGH (ref 70–99)
Glucose-Capillary: 142 mg/dL — ABNORMAL HIGH (ref 70–99)
Glucose-Capillary: 152 mg/dL — ABNORMAL HIGH (ref 70–99)

## 2022-12-22 LAB — BASIC METABOLIC PANEL
Anion gap: 7 (ref 5–15)
BUN: 10 mg/dL (ref 6–20)
CO2: 28 mmol/L (ref 22–32)
Calcium: 7.2 mg/dL — ABNORMAL LOW (ref 8.9–10.3)
Chloride: 104 mmol/L (ref 98–111)
Creatinine, Ser: 0.82 mg/dL (ref 0.61–1.24)
GFR, Estimated: >60 mL/min (ref >60)
Glucose, Bld: 109 mg/dL — ABNORMAL HIGH (ref 70–99)
Potassium: 3 mmol/L — ABNORMAL LOW (ref 3.5–5.1)
Sodium: 139 mmol/L (ref 135–145)

## 2022-12-22 LAB — MAGNESIUM: Magnesium: 1.8 mg/dL (ref 1.7–2.4)

## 2022-12-22 LAB — PHOSPHORUS: Phosphorus: 2.4 mg/dL — ABNORMAL LOW (ref 2.5–4.6)

## 2022-12-22 MED ORDER — IOHEXOL 300 MG/ML  SOLN
100.0000 mL | Freq: Once | INTRAMUSCULAR | Status: AC | PRN
  Administered 2022-12-22: 100 mL via INTRAVENOUS

## 2022-12-22 MED ORDER — SODIUM CHLORIDE (PF) 0.9 % IJ SOLN
INTRAMUSCULAR | Status: AC
  Filled 2022-12-22: qty 50

## 2022-12-22 MED ORDER — POTASSIUM CHLORIDE 20 MEQ PO PACK
40.0000 meq | PACK | ORAL | Status: AC
  Administered 2022-12-22 – 2022-12-23 (×2): 40 meq via ORAL
  Filled 2022-12-22 (×3): qty 2

## 2022-12-22 MED ORDER — MAGNESIUM SULFATE 2 GM/50ML IV SOLN
2.0000 g | Freq: Once | INTRAVENOUS | Status: AC
  Administered 2022-12-22: 2 g via INTRAVENOUS
  Filled 2022-12-22: qty 50

## 2022-12-22 MED ORDER — POTASSIUM PHOSPHATES 15 MMOLE/5ML IV SOLN
30.0000 mmol | Freq: Once | INTRAVENOUS | Status: AC
  Administered 2022-12-22: 30 mmol via INTRAVENOUS
  Filled 2022-12-22: qty 10

## 2022-12-22 NOTE — TOC Benefit Eligibility Note (Signed)
Patient Product/process development scientist completed.    The patient is insured through [employer] [place].     Ran test claim for Lantus Pen and the current 30 day co-pay is $4.00.  Ran test claim for Novolog FlexPen and the current 30 day co-pay is $4.00.  Ran test claim for [employer] 2 Sensor and Requires Prior Authorization  This test claim was processed through [employer]- copay amounts may vary at other pharmacies due to [employer], or as the patient moves through the different stages of their insurance plan.     Roland Earl, CPHT Pharmacy Technician III Certified Patient Advocate [facility] Patient Advocate Team Direct Number: [phone]  Fax: [phone]

## 2022-12-22 NOTE — Progress Notes (Signed)
     [facility] has been requested to perform a transesophageal echocardiogram on Jeffrey [name] for bacteremia.  Patient is a 57 year old male with a past medical history of long QT syndrome with PPM, opioid dependence, PTSD, non-insulin-dependent type 2 DM, obesity. Patient currently admitted with MSSA bacteremia. Echocardiogram this admission with EF 65-70%, no wall motion abnormalities, normal RV function, no significant valvular abnormalities. TEE requested for further evaluation and to rule out endocarditis.   After careful review of history and examination, the risks and benefits of transesophageal echocardiogram have been explained including risks of esophageal damage, perforation (1:10,000 risk), bleeding, pharyngeal hematoma as well as other potential complications associated with conscious sedation including aspiration, arrhythmia, respiratory failure and death. Alternatives to treatment were discussed, questions were answered. Patient is willing to proceed.   Jonita Albee, PA-C 12/22/2022 12:38 PM

## 2022-12-22 NOTE — Progress Notes (Incomplete)
eLink Physician-Brief Progress Note Patient Name: Jeffrey [name] DOB: Oct 09, 1965 MRN: [id number]   Date of Service  12/22/2022  HPI/Events of Note  57 year old male with a past medical history of long QT syndrome with PPM, opioid dependence, PTSD, non-insulin-dependent type 2 DM, obesity. Patient currently admitted with MSSA bacteremia.   Admitted to ICU for TEE.  K3.0.  eICU Interventions  KCl ordered.  K-Phos ordered.  Magnesium sulfate ordered.     Intervention Category Minor Interventions: Electrolytes abnormality - evaluation and management  Alli Jasmer 12/22/2022, 9:13 PM

## 2022-12-22 NOTE — Progress Notes (Signed)
NAME:  Jeffrey [name], MRN:  [id number], DOB:  17-Jun-1965, LOS: 4 ADMISSION DATE:  12/18/2022, CONSULTATION DATE: 9/26 REFERRING MD: Dr. Kirby Crigler, CHIEF COMPLAINT: Hypotension  History of Present Illness:  57 year old male with past medical history as below, which is significant for PTSD, opioid dependence on methadone therapy for 30 years, prediabetes, tremor, and right foot ulceration.  He was in his usual state of health until 9/25 when he developed profound weakness and reported fevers.  Complains of diffuse joint pain but no other significant complaints.  Upon arrival to the emergency department he was febrile and tachycardic.  Infectious workup including labs and CT chest abdomen pelvis were without obvious source.  He was admitted to the hospital service, however, shortly after he became hypotensive refractory to IV fluids and was started on norepinephrine infusion.  PCCM was asked to evaluate for ICU admission.  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   Significant Hospital Events: Including procedures, antibiotic start and stop dates in addition to other pertinent events   9/26 admitted for ? Sepsis, became hypotensive. NE started. Transfer to ICU.  9/26 BCID showing MSSA > Narrowed to ancef  Interim History / Subjective:   Complains of right knee pain Afebrile Good urine output with Lasix Sugars better  Objective   Blood pressure (!) 144/71, pulse 72, temperature 97.6 F (36.4 C), temperature source Oral, resp. rate (!) 5, height 6\' 1"  (1.854 m), weight 111.5 kg, SpO2 96%. CVP:  [5 mmHg-26 mmHg] 18 mmHg      Intake/Output Summary (Last 24 hours) at  12/22/2022 0809 Last data filed at 12/22/2022 0537 Gross per 24 hour  Intake 231.01 ml  Output 3580 ml  Net -3348.99 ml   Filed Weights   12/18/22 0231 12/18/22 1305  Weight: 117.9 kg 111.5 kg    Examination: General: Middle-age, does not appear to be in distress HENT: Moist oral mucosa Lungs: Clear bilateral, no accessory muscle use Cardiovascular: S1-S2 appreciated Abdomen: Bowel sounds appreciated Extremities: No edema, no clubbing, no effusion right knee Neuro: Alert, oriented, non-focal  No labs today CBG normal  Resolved Hospital Problem list   Lactic acidosis  AKI  Assessment & Plan:   Septic shock secondary to MSSA bacteremia -Now on Ancef -Off pressors -Pacer wires remain a concern -Appreciate infectious disease input  Dr. Odessa Fleming note on 08/05/2017: Pacemaker implanted at [facility] in 1993 following syncopal sinus node dysfunction, diagnosed with long QT syndrome at the time.  Generator replacement at [facility], [place] 2006.  -Will obtain cardiology/EP input, will need TEE  Diabetes Hyperglycemia -Continue SSI -Metformin on hold  Opioid dependence -On methadone 150 mg daily  Long QT syndrome -Avoid QT prolonging medications  History of anxiety -Continue escitalopram and clonazepam  Right knee pain -appears chronic, no effusion to suspect staph seeding See how he improves with PT  Check labs today Plan is to transfer to [facility] tele for EP evaluation TO TRH 10/1   Best Practice (right click and "Reselect all SmartList Selections" daily)   Diet/type: Regular consistency (see orders) DVT prophylaxis: LMWH GI prophylaxis: N/A Lines: N/A Foley:  N/A Code Status:  full code Last date of multidisciplinary goals of care discussion [patient alert and interactive,]  Cyril Mourning MD. Tonny Bollman. [facility]  Pager : [phone]  If no response to pager , please call [phone] until 7 pm After 7:00 pm call Elink  [phone]    12/22/2022

## 2022-12-22 NOTE — Progress Notes (Signed)
[facility]  Date of Admission:  12/18/2022   Total days of inpatient antibiotics 3  Principal Problem:   Sepsis (HCC) Active Problems:   Shock (HCC)   Hypovolemia          Assessment: 57 year old male admitted with: #MSSA bacteremia in the setting of pacemaker #History of long QT syndrome requiring PPM #Right knee pain #Fever/leukocytosis secondary to #1-resolved - Patient presented with fatigue x 2 days found to have temp 100.8, no leukocytosis on admission.  Blood cultures grew MSSA, WBC 18K today. - No lines in place, no recent hospitalizations - No GI symptoms.  Essentially symptoms are fatigue, fever. - Source is unclear but he does have a pacemaker which officially does not show signs of infection.  Reviewed Dr. Odessa Fleming note on 08/05/2017: Pacemaker implanted at [facility] in 1993 following syncopal sinus node dysfunction, diagnosed with long QT syndrome at the time.  Generator replacement at [facility], [employer] 2006. - Given MSSA in the setting of pacemaker would reengage EP extraction.  Will get CT of right knee as well as patient has right knee pain, erythema and tenderness on palpation.  Patient states that he had a fall in his right knee recently as well. - TTE without vegetation noted  Recommendations: -Continue cefazolin - Follow repeat blood cultures to ensure clearance - TEE - Off of pressors, plan to transfer to [facility] - CT right knee to look for infection  #History of opioid addiction, patient denies IVDA - Patient has been on methadone for years - Last use per patient was over 20 years ago - Given patient struggles with opioid addiction I do not think he is PICC line candidate due to risk of relapse although it seems he has abstained.    #Uncontrolled diabetes - A1c 11.6 on 12/18/2022 - Management per primary   #PCN allergy-hives -Tolerating cefazolin Microbiology:   Antibiotics: Cefazolin 9/26-present Vancomycin  9/23 Aztreonam 9/25   Cultures: Blood 9/26 2/2 MSSA 9/27 NG     SUBJECTIVE: Resting in bed. Report s right knee pain Interval: Afebrile overnight.   Review of Systems: Review of Systems  All other systems reviewed and are negative.    Scheduled Meds:  amLODipine  10 mg Oral Daily   Chlorhexidine Gluconate Cloth  6 each Topical Daily   citalopram  40 mg Oral Daily   clonazePAM  1 mg Oral BID   enoxaparin (LOVENOX) injection  40 mg Subcutaneous Daily   insulin aspart  0-15 Units Subcutaneous TID WC   insulin aspart  0-5 Units Subcutaneous QHS   insulin glargine-yfgn  11 Units Subcutaneous Daily   methadone  150 mg Oral Daily   metoprolol tartrate  2.5 mg Intravenous Once   Continuous Infusions:  sodium chloride Stopped (12/18/22 1346)   sodium chloride     sodium chloride Stopped (12/19/22 1510)    ceFAZolin (ANCEF) IV Stopped (12/22/22 0556)   PRN Meds:.Place/Maintain arterial line **AND** sodium chloride, sodium chloride, acetaminophen **OR** acetaminophen, albuterol, mouth rinse, traZODone Allergies  Allergen Reactions   Codeine Itching   Penicillins Hives    Has patient had a PCN reaction causing immediate rash, facial/tongue/throat swelling, SOB or lightheadedness with hypotension: unknown Has patient had a PCN reaction causing severe rash involving mucus membranes or skin necrosis: unknown Has patient had a PCN reaction that required hospitalization unknown Has patient had a PCN reaction occurring within the last 10 years: No If all of the above  answers are "NO", then may proceed with Cephalosporin use.     OBJECTIVE: Vitals:   12/22/22 0800 12/22/22 0900 12/22/22 0951 12/22/22 1000  BP: (!) 141/69 126/72 135/61   Pulse: 69 61 60 71  Resp:  14 (!) 5 12  Temp:      TempSrc:      SpO2: 96% 94% 97% 95%  Weight:      Height:       Body mass index is 32.43 kg/m.  Physical Exam Constitutional:      General: He is not in acute distress.    Appearance:  He is normal weight. He is not toxic-appearing.  HENT:     Head: Normocephalic and atraumatic.     Right Ear: External ear normal.     Left Ear: External ear normal.     Nose: No congestion or rhinorrhea.     Mouth/Throat:     Mouth: Mucous membranes are moist.     Pharynx: Oropharynx is clear.  Eyes:     Extraocular Movements: Extraocular movements intact.     Conjunctiva/sclera: Conjunctivae normal.     Pupils: Pupils are equal, round, and reactive to light.  Cardiovascular:     Rate and Rhythm: Normal rate and regular rhythm.     Heart sounds: No murmur heard.    No friction rub. No gallop.  Pulmonary:     Effort: Pulmonary effort is normal.     Breath sounds: Normal breath sounds.  Abdominal:     General: Abdomen is flat. Bowel sounds are normal.     Palpations: Abdomen is soft.  Musculoskeletal:        General: No swelling.     Cervical back: Normal range of motion and neck supple.     Comments: R knee tender, limited Rom  Skin:    General: Skin is warm and dry.  Neurological:     General: No focal deficit present.     Mental Status: He is oriented to person, place, and time.  Psychiatric:        Mood and Affect: Mood normal.       Lab Results Lab Results  Component Value Date   WBC 8.8 12/20/2022   HGB 11.2 (L) 12/20/2022   HCT 33.4 (L) 12/20/2022   MCV 87.7 12/20/2022   PLT 87 (L) 12/20/2022    Lab Results  Component Value Date   CREATININE 0.82 12/22/2022   BUN 10 12/22/2022   NA 139 12/22/2022   K 3.0 (L) 12/22/2022   CL 104 12/22/2022   CO2 28 12/22/2022    Lab Results  Component Value Date   ALT 32 12/18/2022   AST 69 (H) 12/18/2022   ALKPHOS 46 12/18/2022   BILITOT 1.6 (H) 12/18/2022        Danelle Earthly, MD [facility] 12/22/2022, 11:50 AM I have personally spent 52 minutes involved in face-to-face and non-face-to-face activities for this patient on the day of the visit.  Professional time spent includes the following activities: Preparing to see the patient (review of tests), Obtaining and/or reviewing separately obtained history (admission/discharge record), Performing a medically appropriate examination and/or evaluation , Ordering medications/tests/procedures, referring and communicating with other health care professionals, Documenting clinical information in the EMR, Independently interpreting results (not separately reported), Communicating results to the patient/family/caregiver, Counseling and educating the patient/family/caregiver and Care coordination (not separately reported).

## 2022-12-22 NOTE — Plan of Care (Signed)

## 2022-12-23 ENCOUNTER — Encounter (HOSPITAL_COMMUNITY)
Admission: EM | Disposition: A | Discharge status: Home / Self Care | Payer: Self-pay | Source: Home / Self Care | Attending: Internal Medicine

## 2022-12-23 ENCOUNTER — Inpatient Hospital Stay (HOSPITAL_COMMUNITY): Payer: MEDICAID | Admitting: Anesthesiology

## 2022-12-23 ENCOUNTER — Inpatient Hospital Stay (HOSPITAL_COMMUNITY): Payer: MEDICAID

## 2022-12-23 DIAGNOSIS — R7881 Bacteremia: Secondary | ICD-10-CM

## 2022-12-23 DIAGNOSIS — R6520 Severe sepsis without septic shock: Secondary | ICD-10-CM | POA: Diagnosis not present

## 2022-12-23 DIAGNOSIS — A419 Sepsis, unspecified organism: Secondary | ICD-10-CM | POA: Diagnosis not present

## 2022-12-23 HISTORY — PX: TEE WITHOUT CARDIOVERSION: SHX5443

## 2022-12-23 LAB — GLUCOSE, CAPILLARY
Glucose-Capillary: 132 mg/dL — ABNORMAL HIGH (ref 70–99)
Glucose-Capillary: 112 mg/dL — ABNORMAL HIGH (ref 70–99)
Glucose-Capillary: 87 mg/dL (ref 70–99)
Glucose-Capillary: 113 mg/dL — ABNORMAL HIGH (ref 70–99)

## 2022-12-23 LAB — COMPREHENSIVE METABOLIC PANEL
ALT: 16 U/L (ref 0–44)
AST: 55 U/L — ABNORMAL HIGH (ref 15–41)
Albumin: 2.6 g/dL — ABNORMAL LOW (ref 3.5–5.0)
Alkaline Phosphatase: 115 U/L (ref 38–126)
Anion gap: 10 (ref 5–15)
BUN: 9 mg/dL (ref 6–20)
CO2: 24 mmol/L (ref 22–32)
Calcium: 7.8 mg/dL — ABNORMAL LOW (ref 8.9–10.3)
Chloride: 102 mmol/L (ref 98–111)
Creatinine, Ser: 0.76 mg/dL (ref 0.61–1.24)
GFR, Estimated: >60 mL/min (ref >60)
Glucose, Bld: 122 mg/dL — ABNORMAL HIGH (ref 70–99)
Potassium: 3.3 mmol/L — ABNORMAL LOW (ref 3.5–5.1)
Sodium: 136 mmol/L (ref 135–145)
Total Bilirubin: 0.9 mg/dL (ref 0.3–1.2)
Total Protein: 6.4 g/dL — ABNORMAL LOW (ref 6.5–8.1)

## 2022-12-23 LAB — BASIC METABOLIC PANEL
Anion gap: 7 (ref 5–15)
BUN: 8 mg/dL (ref 6–20)
CO2: 26 mmol/L (ref 22–32)
Calcium: 7.6 mg/dL — ABNORMAL LOW (ref 8.9–10.3)
Chloride: 101 mmol/L (ref 98–111)
Creatinine, Ser: 0.58 mg/dL — ABNORMAL LOW (ref 0.61–1.24)
GFR, Estimated: >60 mL/min (ref >60)
Glucose, Bld: 165 mg/dL — ABNORMAL HIGH (ref 70–99)
Potassium: 3.7 mmol/L (ref 3.5–5.1)
Sodium: 134 mmol/L — ABNORMAL LOW (ref 135–145)

## 2022-12-23 LAB — MAGNESIUM: Magnesium: 2.3 mg/dL (ref 1.7–2.4)

## 2022-12-23 LAB — CBC WITH DIFFERENTIAL/PLATELET
Abs Immature Granulocytes: 0.18 10*3/uL — ABNORMAL HIGH (ref 0.00–0.07)
Basophils Absolute: 0 10*3/uL (ref 0.0–0.1)
Basophils Relative: 0 %
Eosinophils Absolute: 0.1 10*3/uL (ref 0.0–0.5)
Eosinophils Relative: 1 %
HCT: 33.3 % — ABNORMAL LOW (ref 39.0–52.0)
Hemoglobin: 10.7 g/dL — ABNORMAL LOW (ref 13.0–17.0)
Immature Granulocytes: 2 %
Lymphocytes Relative: 14 %
Lymphs Abs: 1.1 10*3/uL (ref 0.7–4.0)
MCH: 28.3 pg (ref 26.0–34.0)
MCHC: 32.1 g/dL (ref 30.0–36.0)
MCV: 88.1 fL (ref 80.0–100.0)
Monocytes Absolute: 0.6 10*3/uL (ref 0.1–1.0)
Monocytes Relative: 8 %
Neutro Abs: 5.6 10*3/uL (ref 1.7–7.7)
Neutrophils Relative %: 75 %
Platelets: 100 10*3/uL — ABNORMAL LOW (ref 150–400)
RBC: 3.78 MIL/uL — ABNORMAL LOW (ref 4.22–5.81)
RDW: 13.4 % (ref 11.5–15.5)
WBC: 7.6 10*3/uL (ref 4.0–10.5)
nRBC: 0 % (ref 0.0–0.2)

## 2022-12-23 LAB — ECHO TEE

## 2022-12-23 LAB — PHOSPHORUS: Phosphorus: 3.3 mg/dL (ref 2.5–4.6)

## 2022-12-23 SURGERY — TRANSESOPHAGEAL ECHOCARDIOGRAM (TEE)
Anesthesia: Monitor Anesthesia Care

## 2022-12-23 MED ORDER — SODIUM CHLORIDE 0.9% FLUSH: 10.0000 mL | INTRAVENOUS | Status: DC | PRN

## 2022-12-23 MED ORDER — SODIUM CHLORIDE 0.9% FLUSH
10.0000 mL | Freq: Two times a day (BID) | INTRAVENOUS | Status: DC
  Administered 2022-12-23: 30 mL
  Administered 2022-12-23 – 2022-12-26 (×6): 10 mL
  Administered 2022-12-26: 30 mL
  Administered 2022-12-27: 20 mL

## 2022-12-23 MED ORDER — ATENOLOL 25 MG PO TABS
50.0000 mg | ORAL_TABLET | Freq: Every day | ORAL | Status: DC
  Administered 2022-12-23 – 2022-12-24 (×2): 50 mg via ORAL
  Filled 2022-12-23 (×2): qty 2

## 2022-12-23 MED ORDER — LIDOCAINE 2% (20 MG/ML) 5 ML SYRINGE
INTRAMUSCULAR | Status: DC | PRN
  Administered 2022-12-23: 100 mg via INTRAVENOUS

## 2022-12-23 MED ORDER — PROPOFOL 10 MG/ML IV BOLUS
INTRAVENOUS | Status: DC | PRN
  Administered 2022-12-23 (×2): 50 mg via INTRAVENOUS
  Administered 2022-12-23: 100 mg via INTRAVENOUS
  Administered 2022-12-23 (×2): 50 mg via INTRAVENOUS

## 2022-12-23 MED ORDER — METFORMIN HCL 500 MG PO TABS
500.0000 mg | ORAL_TABLET | Freq: Two times a day (BID) | ORAL | Status: DC
  Administered 2022-12-24: 500 mg via ORAL
  Filled 2022-12-23: qty 1

## 2022-12-23 MED ORDER — GLYCOPYRROLATE PF 0.2 MG/ML IJ SOSY
PREFILLED_SYRINGE | INTRAMUSCULAR | Status: DC | PRN
Start: 2022-12-23 — End: 2022-12-23
  Administered 2022-12-23: .2 mg via INTRAVENOUS

## 2022-12-23 MED ORDER — COLCHICINE 0.6 MG PO TABS
0.6000 mg | ORAL_TABLET | Freq: Every day | ORAL | Status: DC
  Administered 2022-12-24 – 2023-01-02 (×9): 0.6 mg via ORAL
  Filled 2022-12-23 (×11): qty 1

## 2022-12-23 MED ORDER — SODIUM CHLORIDE 0.9 % IV SOLN: INTRAVENOUS | Status: DC

## 2022-12-23 NOTE — Progress Notes (Signed)
Physical Therapy Treatment Patient Details Name: Jeffrey [name] MRN: [id number] DOB: [birth year] Today's Date: 12/23/2022   History of Present Illness Patient is a 57 year old male who presented to the hospital on 9/25 with fevers and profound weakness. Patient was admitted with hypotension with pressors started, septic shock secondary to MSSA bacteremia, AKI. PMH: opioid dependence, diabetes, anxiety, PTSD.    PT Comments  Pt seen in step down unit RM # [id number] General Comments: patient was plesant, motivated and cooperative during session Pt was OOB in recliner.  Assisted with amb was limited and required + 2 assist.  General transfer comment: verbal cues for safety and hand placement, also cues for weight shifting; encouraged use of UEs through recliner to "push self up"  General Gait Details: used B platform EVA walker for increased support due to weakness and RIGHT knee pain.  Required + 2 asisst such that recliner was following.  Limited distance of 12 feet due to fatigue/effort. Returned to room in recliner.   LPT has rec SNF however pt hopes to be able to go home where he lives with his parents.     If plan is discharge home, recommend the following: A lot of help with walking and/or transfers;A lot of help with bathing/dressing/bathroom;Help with stairs or ramp for entrance;Assistance with cooking/housework;Assist for transportation   Can travel by private vehicle     No  Equipment Recommendations  Rolling walker (2 wheels)    Recommendations for Other Services       Precautions / Restrictions Precautions Precautions: Fall Precaution Comments: bad RIGHT knee Restrictions Weight Bearing Restrictions: No     Mobility  Bed Mobility               General bed mobility comments: OOB in recliner    Transfers Overall transfer level: Needs assistance Equipment used: None Transfers: Sit to/from Stand Sit to Stand: Mod assist, +2 physical assistance            General transfer comment: verbal cues for safety and hand placement, also cues for weight shifting; encouraged use of UEs through recliner to "push self up"    Ambulation/Gait Ambulation/Gait assistance: Mod assist, +2 physical assistance, +2 safety/equipment Gait Distance (Feet): 12 Feet Assistive device: Fara Boros Gait Pattern/deviations: Step-to pattern, Decreased stance time - right Gait velocity: decreased     General Gait Details: used B platform EVA walker for increased support due to weakness and RIGHT knee pain.  Required + 2 asisst such that recliner was following.  Limited distance of 12 feet due to fatigue/effort.   Stairs             Wheelchair Mobility     Tilt Bed    Modified Rankin (Stroke Patients Only)       Balance                                            Cognition Arousal: Alert Behavior During Therapy: WFL for tasks assessed/performed Overall Cognitive Status: Within Functional Limits for tasks assessed                                 General Comments: patient was plesant, motivated and cooperative during session        Exercises      General Comments  Pertinent Vitals/Pain Pain Assessment Pain Assessment: Faces Faces Pain Scale: Hurts little more Pain Location: R knee Pain Descriptors / Indicators: Discomfort, Constant, Grimacing Pain Intervention(s): Monitored during session    Home Living                          Prior Function            PT Goals (current goals can now be found in the care plan section) Progress towards PT goals: Progressing toward goals    Frequency    Min 1X/week      PT Plan      Co-evaluation              AM-PAC PT "6 Clicks" Mobility   Outcome Measure  Help needed turning from your back to your side while in a flat bed without using bedrails?: A Lot Help needed moving from lying on your back to sitting on the side of a flat  bed without using bedrails?: A Lot Help needed moving to and from a bed to a chair (including a wheelchair)?: A Lot Help needed standing up from a chair using your arms (e.g., wheelchair or bedside chair)?: A Lot Help needed to walk in hospital room?: A Lot Help needed climbing 3-5 steps with a railing? : Total 6 Click Score: 11    End of Session Equipment Utilized During Treatment: Gait belt Activity Tolerance: Patient tolerated treatment well Patient left: in chair;with call bell/phone within reach;with chair alarm set Nurse Communication: Mobility status PT Visit Diagnosis: Difficulty in walking, not elsewhere classified (R26.2);Pain Pain - Right/Left: Right Pain - part of body: Knee     Time: 0935-1000 PT Time Calculation (min) (ACUTE ONLY): 25 min  Charges:    $Gait Training: 8-22 mins $Therapeutic Activity: 8-22 mins PT General Charges $$ ACUTE PT VISIT: 1 Visit                     Felecia Shelling  PTA Acute  Rehabilitation Services Office M-F          [phone]

## 2022-12-23 NOTE — Anesthesia Postprocedure Evaluation (Signed)
Anesthesia Post Note  Patient: Jeffrey [name]  Procedure(s) Performed: TRANSESOPHAGEAL ECHOCARDIOGRAM     Patient location during evaluation: Cath Lab Anesthesia Type: MAC Level of consciousness: awake Pain management: pain level controlled Vital Signs Assessment: post-procedure vital signs reviewed and stable Respiratory status: spontaneous breathing, nonlabored ventilation and respiratory function stable Cardiovascular status: blood pressure returned to baseline and stable Postop Assessment: no apparent nausea or vomiting Anesthetic complications: no   No notable events documented.  Last Vitals:  Vitals:   12/23/22 1510 12/23/22 1542  BP:    Pulse: 82   Resp:  18  Temp:  37 C  SpO2: 93%     Last Pain:  Vitals:   12/23/22 1542  TempSrc: Oral  PainSc: 2                  Aeden Matranga P Illya Gienger

## 2022-12-23 NOTE — Transfer of Care (Signed)
Immediate Anesthesia Transfer of Care Note  Patient: Dwane Andres Claycomb  Procedure(s) Performed: TRANSESOPHAGEAL ECHOCARDIOGRAM  Patient Location: PACU  Anesthesia Type:MAC  Level of Consciousness: drowsy  Airway & Oxygen Therapy: Patient Spontanous Breathing and Patient connected to face mask oxygen  Post-op Assessment: Report given to RN and Post -op Vital signs reviewed and stable  Post vital signs: Reviewed and stable  Last Vitals:  Vitals Value Taken Time  BP    Temp    Pulse    Resp    SpO2      Last Pain:  Vitals:   12/23/22 1248  TempSrc:   PainSc: 0-No pain      Patients Stated Pain Goal: 4 (12/21/22 2129)  Complications: No notable events documented.

## 2022-12-23 NOTE — Anesthesia Preprocedure Evaluation (Addendum)
Anesthesia Evaluation  Patient identified by MRN, date of birth, ID band Patient awake    Reviewed: Allergy & Precautions, NPO status , Patient's Chart, lab work & pertinent test results  Airway Mallampati: II  TM Distance: >3 FB Neck ROM: Full    Dental  (+) Edentulous Lower, Edentulous Upper   Pulmonary neg pulmonary ROS   Pulmonary exam normal        Cardiovascular hypertension, Pt. on home beta blockers Normal cardiovascular exam+ pacemaker      Neuro/Psych  PSYCHIATRIC DISORDERS Anxiety     PTSD (post-traumatic stress disorder)negative neurological ROS     GI/Hepatic negative GI ROS,,,(+)     substance abuse    Endo/Other  diabetes, Oral Hypoglycemic Agents    Renal/GU negative Renal ROS     Musculoskeletal negative musculoskeletal ROS (+)  narcotic dependent  Abdominal  (+) + obese  Peds  Hematology  (+) Blood dyscrasia, anemia   Anesthesia Other Findings Bacteremia  Reproductive/Obstetrics                             Anesthesia Physical Anesthesia Plan  ASA: 3  Anesthesia Plan: MAC   Post-op Pain Management:    Induction:   PONV Risk Score and Plan: 1 and Propofol infusion and Treatment may vary due to age or medical condition  Airway Management Planned: Nasal Cannula  Additional Equipment:   Intra-op Plan:   Post-operative Plan:   Informed Consent: I have reviewed the patients History and Physical, chart, labs and discussed the procedure including the risks, benefits and alternatives for the proposed anesthesia with the patient or authorized representative who has indicated his/her understanding and acceptance.     Dental advisory given  Plan Discussed with: CRNA  Anesthesia Plan Comments:        Anesthesia Quick Evaluation

## 2022-12-23 NOTE — Progress Notes (Signed)
Pt refused taking colchicine. He said he had expressive diarrhea when he took it for gout.  Hinton Dyer, RN

## 2022-12-23 NOTE — Interval H&P Note (Signed)
History and Physical Interval Note:  12/23/2022 1:33 PM  Jeffrey [name]  has presented today for surgery, with the diagnosis of Bacteremia.  The various methods of treatment have been discussed with the patient and family. After consideration of risks, benefits and other options for treatment, the patient has consented to  Procedure(s): TRANSESOPHAGEAL ECHOCARDIOGRAM (N/A) as a surgical intervention.  The patient's history has been reviewed, patient examined, no change in status, stable for surgery.  I have reviewed the patient's chart and labs.  Questions were answered to the patient's satisfaction.     [name]

## 2022-12-23 NOTE — Interval H&P Note (Signed)
History and Physical Interval Note:  12/23/2022 12:49 PM  Jeffrey [name]  has presented today for surgery, with the diagnosis of Bacteremia.  The various methods of treatment have been discussed with the patient and family. After consideration of risks, benefits and other options for treatment, the patient has consented to  Procedure(s): TRANSESOPHAGEAL ECHOCARDIOGRAM (N/A) as a surgical intervention.  The patient's history has been reviewed, patient examined, no change in status, stable for surgery.  I have reviewed the patient's chart and labs.  Questions were answered to the patient's satisfaction.     [name]

## 2022-12-23 NOTE — TOC Progression Note (Signed)
Transition of Care [facility]) - Progression Note    Patient Details  Name: Jeffrey [name] MRN: [id number] Date of Birth: 27-Jan-1966  [facility] CM/SW Contact  Darleene Cleaver, [place] Phone Number: 12/23/2022, 3:24 PM  Clinical Narrative:     Patient has transferred to [facility] to the cath lab.  This TOC signing off.   Expected Discharge Plan: Home/Self Care Barriers to Discharge: Continued Medical Work up  Expected Discharge Plan and Services   Discharge Planning Services: CM Consult                                           Social Determinants of Health (SDOH) Interventions SDOH Screenings   Food Insecurity: No Food Insecurity (12/21/2022)  Housing: Low Risk  (12/21/2022)  Transportation Needs: No Transportation Needs (12/21/2022)  Utilities: Not At Risk (12/21/2022)  Depression (PHQ2-9): Low Risk  (09/23/2022)  Tobacco Use: High Risk (12/20/2022)    Readmission Risk Interventions    12/21/2022   12:45 PM  Readmission Risk Prevention Plan  Transportation Screening Complete  PCP or Specialist Appt within 5-7 Days Complete  Home Care Screening Complete  Medication Review (RN CM) Complete

## 2022-12-23 NOTE — CV Procedure (Signed)
INDICATIONS: Bacteremia   PROCEDURE:   Informed consent was obtained prior to the procedure. The risks, benefits and alternatives for the procedure were discussed and the patient comprehended these risks.  Risks include, but are not limited to, cough, sore throat, vomiting, nausea, somnolence, esophageal and stomach trauma or perforation, bleeding, low blood pressure, aspiration, pneumonia, infection, trauma to the teeth and death.    After a procedural time-out, the oropharynx was anesthetized with 20% benzocaine spray.   During this procedure the patient was administered a total of propofol, see anesthesia note, to achieve and maintain moderate conscious sedation.  The patient's heart rate, blood pressure, and oxygen saturationweare monitored continuously during the procedure. The period of conscious sedation was 15 minutes, of which I was present face-to-face 100% of this time.  The transesophageal probe was inserted in the esophagus and stomach without difficulty and multiple views were obtained.  The patient was kept under observation until the patient left the procedure room.  The patient left the procedure room in stable condition.   Agitated microbubble saline contrast was not administered.  COMPLICATIONS:    There were no immediate complications.  FINDINGS:  Normal LV/RV function Small fibrous material on his PPM lead, low suspicion for vegetation  RECOMMENDATIONS:     If remains bacteremic with no other source, can consider repeat study.  Overall low suspicion for vegetation  Time Spent Directly with the Patient:  15 minutes   [name] 12/23/2022, 1:34 PM

## 2022-12-23 NOTE — Progress Notes (Signed)
HOSPITALIST ROUNDING NOTE SONIA STICKELS WUJ:[id number]  DOB: Feb 04, 1966  DOA: 12/18/2022  PCP: Tresa Garter, MD  12/23/2022,7:22 AM   LOS: 5 days      Code Status: full  From:  home    Current Dispo: SNF     57 year old white male BMI 32 prior PTSD chronic BZD, opioid dependence previously on methadone Diabetic ulcer managed by podiatry Dr. Loreta Ave status post debridement 11/28/2022 Long QT syndrome status post pacemaker 2019 Dr. Thomasenia Bottoms placed 1993 generator replacement [facility] Jude 2006 Presented to the ED 9/25 fatigue nausea vomiting SOB Tmax 100.8 tachycardic slightly low blood pressure lactic acid 2.9-hypotension progressed to became septic started on Neo-Synephrine transferred to ICU 9/26 [facility] ID MSSA 9/27 ID consulted 9/29 came off pressors    Plan  Sepsis secondary to infected pacemaker wires?  Myositis based on CT scan of right knee Underwhelmed by knee exam although it is quite painful in the lateral joint line  treated once in the ED for gout with colchicine which we will try 0.6 mg Defer to ID if they want MRI- Continuing Ancef Q8 keep volumes even no IV fluid now-once pacemaker out delineate course  Acute thrombocytopenia on admission likely from mild DIC now resolving Monitor only  AKI on admission with hyponatremia/hypokalemia and metabolic acidosis Significantly improved-repeat labs periodically Hypomagnesemia was replaced earlier in hospital stay  Diabetic ulcer managed by podiatrist Wound looks well-healed on right lower extremity-outpatient follow-up with Dr. Loreta Ave  Long QTc previous PPM PPM to be removed as per cardiology HTN continue amlodipine 10  BMI 32 Diabetes mellitus TY 2 Resume metformin 500 twice daily this hospital stay-was not taking at home Continue long-acting 11 units with moderate sliding scale, CBG 130 range  HTN Previously on atenolol not taking at home-have resumed that continue amlodipine 10 additionally as blood pressure  slightly up adjust meds in the next several days  Prior PTSD Methadone dependence Continue methadone 150 has been on this for many many years-can continue Celexa 40 daily   DVT prophylaxis: Lovenox  Status is: Inpatient Remains inpatient appropriate because:   Requires pacemaker extraction    Subjective: Main complaint is leg pain which she says started 48 hours ago He has been treated for gout previously-he has never been diagnosed with it by crystal analysis He has no chest pain, overall he feels much better than he did No cough  Objective + exam Vitals:   12/23/22 0100 12/23/22 0200 12/23/22 0300 12/23/22 0417  BP:  133/65    Pulse: 63 61 60   Resp: 12 12 (!) 6   Temp:    97.7 F (36.5 C)  TempSrc:    Oral  SpO2: 93% 94% 91%   Weight:      Height:       Filed Weights   12/18/22 0231 12/18/22 1305  Weight: 117.9 kg 111.5 kg    Examination:  Looks about stated age Has a right-sided triple-lumen in place Chest is clear no wheeze no rales no rhonchi No lower extremity edema wound on right foot seems well-healed Lateral joint line tenderness with difficulty with flexion at the knee slight erythema compared to the left side no ballottement cannot appreciate much fluid  Data Reviewed: reviewed   CBC    Component Value Date/Time   WBC 7.6 12/23/2022 0522   RBC 3.78 (L) 12/23/2022 0522   HGB 10.7 (L) 12/23/2022 0522   HGB 14.0 08/05/2017 1256   HCT 33.3 (L) 12/23/2022 0522  HCT 41.3 08/05/2017 1256   PLT 100 (L) 12/23/2022 0522   PLT 218 08/05/2017 1256   MCV 88.1 12/23/2022 0522   MCV 85 08/05/2017 1256   MCH 28.3 12/23/2022 0522   MCHC 32.1 12/23/2022 0522   RDW 13.4 12/23/2022 0522   RDW 13.1 08/05/2017 1256   LYMPHSABS 1.1 12/23/2022 0522   LYMPHSABS 2.7 08/05/2017 1256   MONOABS 0.6 12/23/2022 0522   EOSABS 0.1 12/23/2022 0522   EOSABS 0.1 08/05/2017 1256   BASOSABS 0.0 12/23/2022 0522   BASOSABS 0.0 08/05/2017 1256      Latest Ref Rng &  Units 12/23/2022    5:22 AM 12/22/2022    9:31 AM 12/20/2022    5:53 AM  CMP  Glucose 70 - 99 mg/dL 829  562  130   BUN 6 - 20 mg/dL 8  10  22    Creatinine 0.61 - 1.24 mg/dL 8.65  7.84  6.96   Sodium 135 - 145 mmol/L 134  139  131   Potassium 3.5 - 5.1 mmol/L 3.7  3.0  3.6   Chloride 98 - 111 mmol/L 101  104  102   CO2 22 - 32 mmol/L 26  28  22    Calcium 8.9 - 10.3 mg/dL 7.6  7.2  7.1      Scheduled Meds:  amLODipine  10 mg Oral Daily   Chlorhexidine Gluconate Cloth  6 each Topical Daily   citalopram  40 mg Oral Daily   clonazePAM  1 mg Oral BID   enoxaparin (LOVENOX) injection  40 mg Subcutaneous Daily   insulin aspart  0-15 Units Subcutaneous TID WC   insulin aspart  0-5 Units Subcutaneous QHS   insulin glargine-yfgn  11 Units Subcutaneous Daily   methadone  150 mg Oral Daily   metoprolol tartrate  2.5 mg Intravenous Once   Continuous Infusions:  sodium chloride Stopped (12/18/22 1346)   sodium chloride     sodium chloride Stopped (12/19/22 1510)   sodium chloride 20 mL/hr at 12/23/22 0543    ceFAZolin (ANCEF) IV 200 mL/hr at 12/23/22 0543    Time  45  Rhetta Mura, MD  [facility]

## 2022-12-24 ENCOUNTER — Encounter (HOSPITAL_COMMUNITY): Payer: Self-pay | Admitting: Internal Medicine

## 2022-12-24 ENCOUNTER — Ambulatory Visit: Payer: Self-pay | Admitting: Internal Medicine

## 2022-12-24 DIAGNOSIS — T827XXA Infection and inflammatory reaction due to other cardiac and vascular devices, implants and grafts, initial encounter: Principal | ICD-10-CM

## 2022-12-24 DIAGNOSIS — R7881 Bacteremia: Secondary | ICD-10-CM | POA: Diagnosis not present

## 2022-12-24 DIAGNOSIS — M25561 Pain in right knee: Secondary | ICD-10-CM | POA: Diagnosis not present

## 2022-12-24 LAB — COMPREHENSIVE METABOLIC PANEL
ALT: 14 U/L (ref 0–44)
AST: 43 U/L — ABNORMAL HIGH (ref 15–41)
Albumin: 2.1 g/dL — ABNORMAL LOW (ref 3.5–5.0)
Alkaline Phosphatase: 93 U/L (ref 38–126)
Anion gap: 7 (ref 5–15)
BUN: 9 mg/dL (ref 6–20)
CO2: 26 mmol/L (ref 22–32)
Calcium: 7.5 mg/dL — ABNORMAL LOW (ref 8.9–10.3)
Chloride: 104 mmol/L (ref 98–111)
Creatinine, Ser: 1.06 mg/dL (ref 0.61–1.24)
GFR, Estimated: >60 mL/min (ref >60)
Glucose, Bld: 83 mg/dL (ref 70–99)
Potassium: 3.3 mmol/L — ABNORMAL LOW (ref 3.5–5.1)
Sodium: 137 mmol/L (ref 135–145)
Total Bilirubin: 1.2 mg/dL (ref 0.3–1.2)
Total Protein: 5.7 g/dL — ABNORMAL LOW (ref 6.5–8.1)

## 2022-12-24 LAB — CBC
HCT: 32.8 % — ABNORMAL LOW (ref 39.0–52.0)
Hemoglobin: 10.5 g/dL — ABNORMAL LOW (ref 13.0–17.0)
MCH: 28.3 pg (ref 26.0–34.0)
MCHC: 32 g/dL (ref 30.0–36.0)
MCV: 88.4 fL (ref 80.0–100.0)
Platelets: 114 10*3/uL — ABNORMAL LOW (ref 150–400)
RBC: 3.71 MIL/uL — ABNORMAL LOW (ref 4.22–5.81)
RDW: 13.6 % (ref 11.5–15.5)
WBC: 8 10*3/uL (ref 4.0–10.5)
nRBC: 0 % (ref 0.0–0.2)

## 2022-12-24 LAB — GLUCOSE, CAPILLARY
Glucose-Capillary: 90 mg/dL (ref 70–99)
Glucose-Capillary: 129 mg/dL — ABNORMAL HIGH (ref 70–99)
Glucose-Capillary: 112 mg/dL — ABNORMAL HIGH (ref 70–99)
Glucose-Capillary: 95 mg/dL (ref 70–99)

## 2022-12-24 LAB — CULTURE, BLOOD (ROUTINE X 2)
Culture: NO GROWTH
Culture: NO GROWTH
Special Requests: ADEQUATE
Special Requests: ADEQUATE

## 2022-12-24 MED ORDER — ASPIRIN 81 MG PO CHEW
81.0000 mg | CHEWABLE_TABLET | ORAL | Status: AC
  Administered 2022-12-25: 81 mg via ORAL
  Filled 2022-12-24: qty 1

## 2022-12-24 MED ORDER — SODIUM CHLORIDE 0.9 % WEIGHT BASED INFUSION
3.0000 mL/kg/h | INTRAVENOUS | Status: DC
  Administered 2022-12-25: 3 mL/kg/h via INTRAVENOUS

## 2022-12-24 MED ORDER — AMLODIPINE BESYLATE 5 MG PO TABS: 5.0000 mg | ORAL_TABLET | Freq: Every day | ORAL | Status: DC

## 2022-12-24 MED ORDER — SODIUM CHLORIDE 0.9 % WEIGHT BASED INFUSION
1.0000 mL/kg/h | INTRAVENOUS | Status: DC
  Administered 2022-12-25: 1 mL/kg/h via INTRAVENOUS

## 2022-12-24 MED ORDER — POTASSIUM CHLORIDE CRYS ER 20 MEQ PO TBCR
40.0000 meq | EXTENDED_RELEASE_TABLET | Freq: Once | ORAL | Status: AC
  Administered 2022-12-24: 40 meq via ORAL
  Filled 2022-12-24: qty 2

## 2022-12-24 NOTE — Consult Note (Signed)
ELECTROPHYSIOLOGY CONSULT NOTE    Patient ID: Jeffrey [name] MRN: [id number], DOB/AGE: 02-Apr-1965 57 y.o.  Admit date: 12/18/2022 Date of Consult: 12/24/2022  Primary Physician: Tresa Garter, MD Primary Cardiologist: None  Electrophysiologist: Dr. Lalla Brothers   Referring Provider: Dr. Benjamine Mola  Patient Profile: Jeffrey [name] is a 57 y.o. male with a history of long QT s/p PPM at [facility] > 55yrs ago, opioid dependence, PTSD, substance abuse, poorly controlled DM with right foot ulceration (followed by Podiatry) who is being seen today for the evaluation of CIED at the request of Dr. Benjamine Mola.  HPI:  Jeffrey [name] is a 57 y.o. male who presented to [facility] ER 9/25 with reports of fatigue, nausea, vomiting, SOB.  He was found to have septic shock requiring vasopressors, AKI, & thrombocytopenia. CT chest/abd/pelvis was negative for acute findings. R knee CT was with questionable myositis.  Blood cultures grew MSSA.  The patient underwent TEE 10/1 which showed fibrinous material on RA lead. ID following for sepsis.   He denies chest pain, palpitations, dyspnea, PND, orthopnea, nausea, vomiting, dizziness, syncope, edema, weight gain, or early satiety.   Labs Potassium3.3* (10/02 4098) Magnesium  2.3 (10/01 0522) Creatinine, ser  1.06 (10/02 0346) PLT  114* (10/02 0346) HGB  10.5* (10/02 0346) WBC 8.0 (10/02 0346)  .    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 Jude, Identity XL [address], serial # [id number]. St Jude rep must physically interrogate pacemaker.    PTSD (post-traumatic stress disorder) 12/19/2014   On Klonopin, Celexa   Potential benefits of a long term benzodiazepines  use as well as potential risks  and complications were explained to the patient and were aknowledged.    Skin abscess 03/09/2015   11/16 - small    Substance abuse (HCC)    opioids   Tremor 07/17/2016   ?etiology      Surgical History:  Past Surgical History:  Procedure Laterality Date   HERNIA REPAIR Right    LIVER SURGERY     PPM GENERATOR CHANGEOUT N/A 08/14/2017   Procedure: PPM GENERATOR CHANGEOUT;  Surgeon: Duke Salvia, MD;  Location: [facility] INVASIVE CV LAB;  Service: Cardiovascular;  Laterality: N/A;   TEE WITHOUT CARDIOVERSION N/A 12/23/2022   Procedure: TRANSESOPHAGEAL ECHOCARDIOGRAM;  Surgeon: [name], MD;  Location: [facility] INVASIVE CV LAB;  Service: Cardiovascular;  Laterality: N/A;     Medications Prior to Admission  Medication Sig Dispense Refill Last Dose   ammonium lactate (AMLACTIN) 12 % cream Apply 1 Application topically as needed for dry skin. (Patient taking differently: Apply 1 Application topically as needed for dry skin (right foot- affected areas).) 385 g 0 Past Week   atenolol (TENORMIN) 50 MG tablet Take 1 tablet (50 mg total) by mouth daily. 30 tablet 11 12/16/2022   citalopram (CELEXA) 40 MG tablet Take 1 tablet (40 mg total) by mouth daily. Must keep 05/21/22 appt for future refills (Patient taking differently: Take 40 mg by mouth daily.) 30 tablet 5 12/16/2022   clonazePAM (KLONOPIN) 1 MG tablet  TAKE 1 TABLET BY MOUTH TWICE A DAY (Patient taking differently: Take 1 mg by mouth 2 (two) times daily as needed for anxiety.) 60 tablet 2 Past Week   Continuous Blood Gluc Sensor (FREESTYLE LIBRE 3 SENSOR) MISC 1 Units by Does not apply route every 14 (fourteen) days. (Patient taking differently: Inject 1 Device into the skin every 14 (fourteen) days.) 6 each 3 Past Month   ibuprofen (ADVIL,MOTRIN) 200 MG tablet Take 400 mg by mouth every 6 (six) hours as needed for mild pain, moderate pain or headache.   unk    methadone (DOLOPHINE) 10 MG/ML solution Take 150 mg by mouth in the morning.   12/18/2022 at am   mupirocin ointment (BACTROBAN) 2 % On leg wound w/dressing change qd or bid (Patient taking differently: Apply 1 Application topically See admin instructions. Apply to affected areas of the right foot once a week) 30 g 0 Past Week   silver sulfADIAZINE (SILVADENE) 1 % cream Apply 1 Application topically daily. (Patient taking differently: Apply 1 Application topically See admin instructions. Apply to affected areas of the right foot 2 times a week) 50 g 0 Past Week   Vitamin D, Ergocalciferol, (DRISDOL) 1.25 MG (50000 UNIT) CAPS capsule Take 1 capsule (50,000 Units total) by mouth every 7 (seven) days. (Patient taking differently: Take 50,000 Units by mouth every Monday.) 8 capsule 0 12/15/2022   Cholecalciferol (VITAMIN D3) 2000 UNITS capsule Take 1 capsule (2,000 Units total) by mouth daily. (Patient not taking: Reported on 12/18/2022) 100 capsule 3 Not Taking   metFORMIN (GLUCOPHAGE) 500 MG tablet Take 1 tablet (500 mg total) by mouth 2 (two) times daily with a meal. (Patient not taking: Reported on 12/18/2022) 180 tablet 3 Not Taking   triamcinolone cream (KENALOG) 0.1 % Apply 1 application topically 3 (three) times daily. (Patient not taking: Reported on 12/18/2022) 45 g 1 Not Taking   valACYclovir (VALTREX) 1000 MG tablet Take 1 tablet (1,000 mg total) by mouth 3 (three) times daily. (Patient not taking: Reported on 12/18/2022) 21 tablet 0 Not Taking    Inpatient Medications:   amLODipine  10 mg Oral Daily   atenolol  50 mg Oral Daily   Chlorhexidine Gluconate Cloth  6 each Topical Daily   citalopram  40 mg Oral Daily   clonazePAM  1 mg Oral BID   colchicine  0.6 mg Oral Daily   enoxaparin (LOVENOX) injection  40 mg Subcutaneous Daily   insulin aspart  0-15 Units Subcutaneous TID WC   insulin aspart  0-5 Units Subcutaneous QHS   insulin glargine-yfgn  11 Units Subcutaneous Daily   metFORMIN  500 mg  Oral BID WC   methadone  150 mg Oral Daily   metoprolol tartrate  2.5 mg Intravenous Once   sodium chloride flush  10-40 mL Intracatheter Q12H    Allergies:  Allergies  Allergen Reactions   Codeine Itching   Penicillins Hives    Has patient had a PCN reaction causing immediate rash, facial/tongue/throat swelling, SOB or lightheadedness with hypotension: unknown Has patient had a PCN reaction causing severe rash involving mucus membranes or skin necrosis: unknown Has patient had a PCN reaction that required hospitalization unknown Has patient had a PCN reaction occurring within the last 10 years: No If all of the above answers are "NO", then may proceed with Cephalosporin use.     Family History  Problem Relation Age of Onset   Depression Mother    Diabetes Mother    Arthritis Mother  Arthritis Father      Physical Exam: Vitals:   12/23/22 2009 12/24/22 0027 12/24/22 0341 12/24/22 0752  BP: 113/63 99/63 (!) 101/59 113/65  Pulse: 60 60 60 60  Resp: 16 19  14   Temp: 99.1 F (37.3 C) 98 F (36.7 C) 97.8 F (36.6 C) 98.3 F (36.8 C)  TempSrc: Oral Oral Oral Oral  SpO2: 94% 94% 95%   Weight:   109.4 kg   Height:        GEN- NAD, A&O x 3, normal affect HEENT: Normocephalic, atraumatic Lungs- CTAB, Normal effort.  Heart- Regular rate and rhythm, No M/G/R.  GI- Soft, NT, ND.  Extremities- No clubbing, cyanosis, or edema   Radiology/Studies: CT Chest/ABD/Pelvis 9/26 > no acute findings  CT R Knee 9/30 > questionable myositis  TEE 10/1 > LVEF 60-65%, small strand coming from the RA lead > fibrinous material vs vegetation    EKG:12/18/22 ST, LBBB (personally reviewed)  TELEMETRY:  AP 60 / SR 80's (personally reviewed)  DEVICE HISTORY:  [employer] Dual Chamber > initial implant 1993 at [facility], generator change 09/25/2004, generator change 2019. Atrial lead MDT 5524, RV lead MDT 4058  MICRO: BCx2 9/26 > MSSA  BCx2 9/27 > negative  Assessment/Plan:  CIED  MSSA  Bacteremia  TEE with fibrinous material on right atrial lead. 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  -pending case discussion with Dr. Laneta Simmers, anticipate explant of device with hybrid procedure in OR, timing TBD pending TCTS formal consult -continue cefazolin  -plan of care discussed with patient by Dr. 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  -ID following  -suspect likely source of infection   Poorly Controlled DM II  Hgb A1c 9.5 09/2022 -per primary   Thrombocytopenia  Likely in setting of sepsis  -per primary  -on enoxaparin   Informed Consent   Shared Decision Making/Informed Consent The risks [stroke (1 in 1000), death (1 in 1000), kidney failure [usually temporary] (1 in 500), bleeding (1 in 200), allergic reaction [possibly serious] (1 in 200)], benefits (diagnostic support and management of coronary artery disease) and alternatives of a cardiac catheterization were discussed in detail with Jeffrey [name] and he is willing to proceed.      For questions or updates, please contact [facility] Please consult [email or web address] for contact info under Cardiology/STEMI.  Signed, Canary Brim, MSN, APRN, NP-C, AGACNP-BC [facility] - Electrophysiology  12/24/2022, 8:37 AM

## 2022-12-24 NOTE — Plan of Care (Signed)

## 2022-12-24 NOTE — Inpatient Diabetes Management (Signed)
Inpatient Diabetes Program Recommendations  AACE/ADA: New Consensus Statement on Inpatient Glycemic Control  Target Ranges:  Prepandial:   less than 140 mg/dL      Peak postprandial:   less than 180 mg/dL (1-2 hours)      Critically ill patients:  140 - 180 mg/dL    Latest Reference Range & Units 12/23/22 07:44 12/23/22 11:42 12/23/22 16:12 12/23/22 21:27 12/24/22 07:52  Glucose-Capillary 70 - 99 mg/dL 161 (H) 096 (H) 87 045 (H) 90   Review of Glycemic Control  Diabetes history: DM2 Outpatient Diabetes medications: Metformin 500 mg BID (not taking), FreeStyle Libre3 Current orders for Inpatient glycemic control: Semglee 11 units daily, Novolog 0-15 units TID with meals, Novolog 0-5 units at bedtime, Metformin 500 mg BID  Inpatient Diabetes Program Recommendations:    Insulin: May want to consider decreasing Semglee to 8 units daily.  Thanks, Orlando Penner, RN, MSN, CDCES Diabetes Coordinator Inpatient Diabetes Program [phone] (Team Pager from 8am to 5pm)

## 2022-12-24 NOTE — H&P (View-Only) (Signed)
ELECTROPHYSIOLOGY CONSULT NOTE    Patient ID: Jeffrey [name] MRN: [id number], DOB/AGE: 02-Apr-1965 57 y.o.  Admit date: 12/18/2022 Date of Consult: 12/24/2022  Primary Physician: Tresa Garter, MD Primary Cardiologist: None  Electrophysiologist: Dr. Lalla Brothers   Referring Provider: Dr. Benjamine Mola  Patient Profile: Jeffrey [name] is a 57 y.o. male with a history of long QT s/p PPM at [facility] > 55yrs ago, opioid dependence, PTSD, substance abuse, poorly controlled DM with right foot ulceration (followed by Podiatry) who is being seen today for the evaluation of CIED at the request of Dr. Benjamine Mola.  HPI:  Jeffrey [name] is a 57 y.o. male who presented to [facility] ER 9/25 with reports of fatigue, nausea, vomiting, SOB.  He was found to have septic shock requiring vasopressors, AKI, & thrombocytopenia. CT chest/abd/pelvis was negative for acute findings. R knee CT was with questionable myositis.  Blood cultures grew MSSA.  The patient underwent TEE 10/1 which showed fibrinous material on RA lead. ID following for sepsis.   He denies chest pain, palpitations, dyspnea, PND, orthopnea, nausea, vomiting, dizziness, syncope, edema, weight gain, or early satiety.   Labs Potassium3.3* (10/02 4098) Magnesium  2.3 (10/01 0522) Creatinine, ser  1.06 (10/02 0346) PLT  114* (10/02 0346) HGB  10.5* (10/02 0346) WBC 8.0 (10/02 0346)  .    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, Identity XL [address], serial # [id number]. [employer] rep must physically interrogate pacemaker.    PTSD (post-traumatic stress disorder) 12/19/2014   On Klonopin, Celexa   Potential benefits of a long term benzodiazepines  use as well as potential risks  and complications were explained to the patient and were aknowledged.    Skin abscess 03/09/2015   11/16 - small    Substance abuse (HCC)    opioids   Tremor 07/17/2016   ?etiology      Surgical History:  Past Surgical History:  Procedure Laterality Date   HERNIA REPAIR Right    LIVER SURGERY     PPM GENERATOR CHANGEOUT N/A 08/14/2017   Procedure: PPM GENERATOR CHANGEOUT;  Surgeon: Duke Salvia, MD;  Location: [facility] INVASIVE CV LAB;  Service: Cardiovascular;  Laterality: N/A;   TEE WITHOUT CARDIOVERSION N/A 12/23/2022   Procedure: TRANSESOPHAGEAL ECHOCARDIOGRAM;  Surgeon: [name], MD;  Location: [facility] INVASIVE CV LAB;  Service: Cardiovascular;  Laterality: N/A;     Medications Prior to Admission  Medication Sig Dispense Refill Last Dose   ammonium lactate (AMLACTIN) 12 % cream Apply 1 Application topically as needed for dry skin. (Patient taking differently: Apply 1 Application topically as needed for dry skin (right foot- affected areas).) 385 g 0 Past Week   atenolol (TENORMIN) 50 MG tablet Take 1 tablet (50 mg total) by mouth daily. 30 tablet 11 12/16/2022   citalopram (CELEXA) 40 MG tablet Take 1 tablet (40 mg total) by mouth daily. Must keep 05/21/22 appt for future refills (Patient taking differently: Take 40 mg by mouth daily.) 30 tablet 5 12/16/2022   clonazePAM (KLONOPIN) 1 MG tablet  TAKE 1 TABLET BY MOUTH TWICE A DAY (Patient taking differently: Take 1 mg by mouth 2 (two) times daily as needed for anxiety.) 60 tablet 2 Past Week   Continuous Blood Gluc Sensor (FREESTYLE LIBRE 3 SENSOR) MISC 1 Units by Does not apply route every 14 (fourteen) days. (Patient taking differently: Inject 1 Device into the skin every 14 (fourteen) days.) 6 each 3 Past Month   ibuprofen (ADVIL,MOTRIN) 200 MG tablet Take 400 mg by mouth every 6 (six) hours as needed for mild pain, moderate pain or headache.   unk    methadone (DOLOPHINE) 10 MG/ML solution Take 150 mg by mouth in the morning.   12/18/2022 at am   mupirocin ointment (BACTROBAN) 2 % On leg wound w/dressing change qd or bid (Patient taking differently: Apply 1 Application topically See admin instructions. Apply to affected areas of the right foot once a week) 30 g 0 Past Week   silver sulfADIAZINE (SILVADENE) 1 % cream Apply 1 Application topically daily. (Patient taking differently: Apply 1 Application topically See admin instructions. Apply to affected areas of the right foot 2 times a week) 50 g 0 Past Week   Vitamin D, Ergocalciferol, (DRISDOL) 1.25 MG (50000 UNIT) CAPS capsule Take 1 capsule (50,000 Units total) by mouth every 7 (seven) days. (Patient taking differently: Take 50,000 Units by mouth every Monday.) 8 capsule 0 12/15/2022   Cholecalciferol (VITAMIN D3) 2000 UNITS capsule Take 1 capsule (2,000 Units total) by mouth daily. (Patient not taking: Reported on 12/18/2022) 100 capsule 3 Not Taking   metFORMIN (GLUCOPHAGE) 500 MG tablet Take 1 tablet (500 mg total) by mouth 2 (two) times daily with a meal. (Patient not taking: Reported on 12/18/2022) 180 tablet 3 Not Taking   triamcinolone cream (KENALOG) 0.1 % Apply 1 application topically 3 (three) times daily. (Patient not taking: Reported on 12/18/2022) 45 g 1 Not Taking   valACYclovir (VALTREX) 1000 MG tablet Take 1 tablet (1,000 mg total) by mouth 3 (three) times daily. (Patient not taking: Reported on 12/18/2022) 21 tablet 0 Not Taking    Inpatient Medications:   amLODipine  10 mg Oral Daily   atenolol  50 mg Oral Daily   Chlorhexidine Gluconate Cloth  6 each Topical Daily   citalopram  40 mg Oral Daily   clonazePAM  1 mg Oral BID   colchicine  0.6 mg Oral Daily   enoxaparin (LOVENOX) injection  40 mg Subcutaneous Daily   insulin aspart  0-15 Units Subcutaneous TID WC   insulin aspart  0-5 Units Subcutaneous QHS   insulin glargine-yfgn  11 Units Subcutaneous Daily   metFORMIN  500 mg  Oral BID WC   methadone  150 mg Oral Daily   metoprolol tartrate  2.5 mg Intravenous Once   sodium chloride flush  10-40 mL Intracatheter Q12H    Allergies:  Allergies  Allergen Reactions   Codeine Itching   Penicillins Hives    Has patient had a PCN reaction causing immediate rash, facial/tongue/throat swelling, SOB or lightheadedness with hypotension: unknown Has patient had a PCN reaction causing severe rash involving mucus membranes or skin necrosis: unknown Has patient had a PCN reaction that required hospitalization unknown Has patient had a PCN reaction occurring within the last 10 years: No If all of the above answers are "NO", then may proceed with Cephalosporin use.     Family History  Problem Relation Age of Onset   Depression Mother    Diabetes Mother    Arthritis Mother  Arthritis Father      Physical Exam: Vitals:   12/23/22 2009 12/24/22 0027 12/24/22 0341 12/24/22 0752  BP: 113/63 99/63 (!) 101/59 113/65  Pulse: 60 60 60 60  Resp: 16 19  14   Temp: 99.1 F (37.3 C) 98 F (36.7 C) 97.8 F (36.6 C) 98.3 F (36.8 C)  TempSrc: Oral Oral Oral Oral  SpO2: 94% 94% 95%   Weight:   109.4 kg   Height:        GEN- NAD, A&O x 3, normal affect HEENT: Normocephalic, atraumatic Lungs- CTAB, Normal effort.  Heart- Regular rate and rhythm, No M/G/R.  GI- Soft, NT, ND.  Extremities- No clubbing, cyanosis, or edema   Radiology/Studies: CT Chest/ABD/Pelvis 9/26 > no acute findings  CT R Knee 9/30 > questionable myositis  TEE 10/1 > LVEF 60-65%, small strand coming from the RA lead > fibrinous material vs vegetation    EKG:12/18/22 ST, LBBB (personally reviewed)  TELEMETRY:  AP 60 / SR 80's (personally reviewed)  DEVICE HISTORY:  [employer] Dual Chamber > initial implant 1993 at [facility], generator change 09/25/2004, generator change 2019. Atrial lead MDT 5524, RV lead MDT 4058  MICRO: BCx2 9/26 > MSSA  BCx2 9/27 > negative  Assessment/Plan:  CIED  MSSA  Bacteremia  TEE with fibrinous material on right atrial lead. 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  -pending case discussion with Dr. Laneta Simmers, anticipate explant of device with hybrid procedure in OR, timing TBD pending TCTS formal consult -continue cefazolin  -plan of care discussed with patient by Dr. 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  -ID following  -suspect likely source of infection   Poorly Controlled DM II  Hgb A1c 9.5 09/2022 -per primary   Thrombocytopenia  Likely in setting of sepsis  -per primary  -on enoxaparin   Informed Consent   Shared Decision Making/Informed Consent The risks [stroke (1 in 1000), death (1 in 1000), kidney failure [usually temporary] (1 in 500), bleeding (1 in 200), allergic reaction [possibly serious] (1 in 200)], benefits (diagnostic support and management of coronary artery disease) and alternatives of a cardiac catheterization were discussed in detail with Jeffrey [name] and he is willing to proceed.      For questions or updates, please contact [facility] Please consult [email or web address] for contact info under Cardiology/STEMI.  Signed, Canary Brim, MSN, APRN, NP-C, AGACNP-BC [facility] - Electrophysiology  12/24/2022, 8:37 AM

## 2022-12-24 NOTE — Progress Notes (Signed)
[facility]  Date of Admission:  12/18/2022      Total days of antibiotics 7  Cefazolin 12/18/2022 >> c    ASSESSMENT: Jeffrey [name] is a 57 y.o. 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   PPM Lead Vegetation -  -D/W EP team - plan for extraction, will need more complicated approach given this system originally placed a long time ago.  -intermittently a-pacing on tele but frequently overrides  Right Knee - -new onset pain in native knee w/o any injury to explain. On exam this is warm to the touch, has a medial area of erythema noted and he has limited ROM and difficulty weight bearing. Mild effusion on exam, not on CT.  -I think he needs an arthrocentesis to more definitively r/o septic joint however he would like to wait until tomorrow for ortho to see him - Will plan to d/w them tomorrow per his request -Continue IV antibiotics   Right Foot Wound Plantar Wound -  -wounds on Rt foot he has been working with Dr. Ardelle Anton with outpatient which he describe to wax and wane with regards to infection. Seems that they have all healed over. On exam he has hemorrhagic blister on the plantar aspect of metartarsal head of  #1/2. Non painful.    PLAN: Continue IV Cefazolin  Follow EP plans for extraction  Will d/w orthopedics 10/3 to see him for concern over native right knee septic arthritis.    Principal Problem:   Sepsis (HCC) Active Problems:   Shock (HCC)   Hypovolemia   Bacteremia    [START ON 12/25/2022] amLODipine  5 mg Oral Daily   atenolol  50 mg Oral Daily   Chlorhexidine Gluconate Cloth  6 each Topical Daily   citalopram  40 mg Oral Daily   clonazePAM  1 mg Oral BID   colchicine  0.6 mg Oral Daily   enoxaparin (LOVENOX) injection  40 mg Subcutaneous Daily   insulin aspart  0-15 Units Subcutaneous TID WC   insulin aspart   0-5 Units Subcutaneous QHS   insulin glargine-yfgn  11 Units Subcutaneous Daily   metFORMIN  500 mg Oral BID WC   methadone  150 mg Oral Daily   sodium chloride flush  10-40 mL Intracatheter Q12H    SUBJECTIVE: Feeling better than when he came here. Was sick in ICU for a while which we discussed today.  He feels like "his sepsis has made it's way to his right knee."  No recent explanation of trauma or pain to the knee. Has a remote h/o injury many years back. Feels stiff and difficult to bend/flex joint. Warm to the touch and tender to palpation.  No fevers / chills.  No new other pain or concerns.    Review of Systems: Review of Systems  Constitutional:  Negative for chills, fever and malaise/fatigue.  Respiratory:  Negative for cough, sputum production and shortness of breath.   Cardiovascular:  Negative for chest pain.  Gastrointestinal:  Negative for abdominal pain, diarrhea, nausea and vomiting.  Genitourinary:  Negative for dysuria.  Musculoskeletal:  Positive for joint pain (rt knee). Negative for back pain.  Skin:  Negative for rash.  Neurological:  Negative for dizziness and headaches.     Allergies  Allergen Reactions   Codeine Itching   Penicillins Hives  Has patient had a PCN reaction causing immediate rash, facial/tongue/throat swelling, SOB or lightheadedness with hypotension: unknown Has patient had a PCN reaction causing severe rash involving mucus membranes or skin necrosis: unknown Has patient had a PCN reaction that required hospitalization unknown Has patient had a PCN reaction occurring within the last 10 years: No If all of the above answers are "NO", then may proceed with Cephalosporin use.     OBJECTIVE: Vitals:   12/24/22 0027 12/24/22 0341 12/24/22 0752 12/24/22 1225  BP: 99/63 (!) 101/59 113/65 100/60  Pulse: 60 60 60 (!) 59  Resp: 19  14 16   Temp: 98 F (36.7 C) 97.8 F (36.6 C) 98.3 F (36.8 C) 98 F (36.7 C)  TempSrc: Oral Oral Oral  Oral  SpO2: 94% 95%  99%  Weight:  109.4 kg    Height:       Body mass index is 31.81 kg/m.  Physical Exam Vitals reviewed.  Constitutional:      Appearance: Normal appearance. He is not ill-appearing.  HENT:     Head: Normocephalic.     Mouth/Throat:     Mouth: Mucous membranes are moist.     Pharynx: Oropharynx is clear.  Eyes:     General: No scleral icterus. Neck:     Comments: Rt CVC in place - clean / dry dressing  Cardiovascular:     Rate and Rhythm: Normal rate and regular rhythm.  Pulmonary:     Effort: Pulmonary effort is normal.  Musculoskeletal:     Cervical back: Normal range of motion.     Right knee: Swelling and erythema present. Decreased range of motion. Tenderness present.     Left knee: Normal.  Skin:    Coloration: Skin is not jaundiced or pale.  Neurological:     Mental Status: He is alert and oriented to person, place, and time.  Psychiatric:        Mood and Affect: Mood normal.        Judgment: Judgment normal.     Lab Results Lab Results  Component Value Date   WBC 8.0 12/24/2022   HGB 10.5 (L) 12/24/2022   HCT 32.8 (L) 12/24/2022   MCV 88.4 12/24/2022   PLT 114 (L) 12/24/2022    Lab Results  Component Value Date   CREATININE 1.06 12/24/2022   BUN 9 12/24/2022   NA 137 12/24/2022   K 3.3 (L) 12/24/2022   CL 104 12/24/2022   CO2 26 12/24/2022    Lab Results  Component Value Date   ALT 14 12/24/2022   AST 43 (H) 12/24/2022   ALKPHOS 93 12/24/2022   BILITOT 1.2 12/24/2022     Microbiology: Recent Results (from the past 240 hour(s))  Resp panel by RT-PCR (RSV, Flu A&B, Covid) Anterior Nasal Swab     Status: None   Collection Time: 12/18/22  2:39 AM   Specimen: Anterior Nasal Swab  Result Value Ref Range Status   SARS Coronavirus 2 by RT PCR NEGATIVE NEGATIVE Final    Comment: (NOTE) SARS-CoV-2 target nucleic acids are NOT DETECTED.  The SARS-CoV-2 RNA is generally detectable in upper respiratory specimens during the  acute phase of infection. The lowest concentration of SARS-CoV-2 viral copies this assay can detect is 138 copies/mL. A negative result does not preclude SARS-Cov-2 infection and should not be used as the sole basis for treatment or other patient management decisions. A negative result may occur with  improper specimen collection/handling, submission of specimen other than  nasopharyngeal swab, presence of viral mutation(s) within the areas targeted by this assay, and inadequate number of viral copies(<138 copies/mL). A negative result must be combined with clinical observations, patient history, and epidemiological information. The expected result is Negative.  Fact Sheet for Patients:  BloggerCourse.com  Fact Sheet for Healthcare Providers:  SeriousBroker.it  This test is no t yet approved or cleared by the [place] FDA and  has been authorized for detection and/or diagnosis of SARS-CoV-2 by FDA under an Emergency Use Authorization (EUA). This EUA will remain  in effect (meaning this test can be used) for the duration of the COVID-19 declaration under Section 564(b)(1) of the Act, 21 U.S.C.section 360bbb-3(b)(1), unless the authorization is terminated  or revoked sooner.       Influenza A by PCR NEGATIVE NEGATIVE Final   Influenza B by PCR NEGATIVE NEGATIVE Final    Comment: (NOTE) The Xpert Xpress SARS-CoV-2/FLU/RSV plus assay is intended as an aid in the diagnosis of influenza from Nasopharyngeal swab specimens and should not be used as a sole basis for treatment. Nasal washings and aspirates are unacceptable for Xpert Xpress SARS-CoV-2/FLU/RSV testing.  Fact Sheet for Patients: BloggerCourse.com  Fact Sheet for Healthcare Providers: SeriousBroker.it  This test is not yet approved or cleared by the [place] FDA and has been authorized for detection and/or  diagnosis of SARS-CoV-2 by FDA under an Emergency Use Authorization (EUA). This EUA will remain in effect (meaning this test can be used) for the duration of the COVID-19 declaration under Section 564(b)(1) of the Act, 21 U.S.C. section 360bbb-3(b)(1), unless the authorization is terminated or revoked.     Resp Syncytial Virus by PCR NEGATIVE NEGATIVE Final    Comment: (NOTE) Fact Sheet for Patients: BloggerCourse.com  Fact Sheet for Healthcare Providers: SeriousBroker.it  This test is not yet approved or cleared by the [place] FDA and has been authorized for detection and/or diagnosis of SARS-CoV-2 by FDA under an Emergency Use Authorization (EUA). This EUA will remain in effect (meaning this test can be used) for the duration of the COVID-19 declaration under Section 564(b)(1) of the Act, 21 U.S.C. section 360bbb-3(b)(1), unless the authorization is terminated or revoked.  Performed at [facility], [address], [place], [place] [address]   Culture, blood (Routine x 2)     Status: Abnormal   Collection Time: 12/18/22  2:41 AM   Specimen: BLOOD RIGHT FOREARM  Result Value Ref Range Status   Specimen Description   Final    BLOOD RIGHT FOREARM Performed at [facility], [address], [place], [place] [address]    Special Requests   Final    BOTTLES DRAWN AEROBIC AND ANAEROBIC Blood Culture results may not be optimal due to an excessive volume of blood received in culture bottles Performed at [facility], [address] [place], [place] [address]    Culture  Setup Time   Final    GRAM POSITIVE COCCI IN CLUSTERS IN BOTH AEROBIC AND ANAEROBIC BOTTLES CRITICAL VALUE NOTED.  VALUE IS CONSISTENT WITH PREVIOUSLY REPORTED AND CALLED VALUE.    Culture (A)  Final    STAPHYLOCOCCUS AUREUS SUSCEPTIBILITIES PERFORMED ON PREVIOUS CULTURE WITHIN THE LAST 5 DAYS. Performed at [facility], [address], [place], [place] [address]    Report Status 12/20/2022 FINAL  Final  Culture, blood (Routine x 2)     Status: Abnormal   Collection Time: 12/18/22  2:49 AM   Specimen: BLOOD  LEFT FOREARM  Result Value Ref Range Status   Specimen Description   Final    BLOOD LEFT FOREARM Performed at [facility], [address], [place], [place] [address]    Special Requests   Final    BOTTLES DRAWN AEROBIC AND ANAEROBIC Blood Culture results may not be optimal due to an excessive volume of blood received in culture bottles Performed at [facility], [address], [place], [place] [address]    Culture  Setup Time   Final    GRAM POSITIVE COCCI IN CLUSTERS IN BOTH AEROBIC AND ANAEROBIC BOTTLES CRITICAL RESULT CALLED TO, READ BACK BY AND VERIFIED WITH: PHARMD C. Clearance Coots [id number] @1714  FH Performed at [facility], [address] [place], [place] [address]    Culture STAPHYLOCOCCUS AUREUS (A)  Final   Report Status 12/20/2022 FINAL  Final   Organism ID, Bacteria STAPHYLOCOCCUS AUREUS  Final      Susceptibility   Staphylococcus aureus - MIC*    CIPROFLOXACIN >=8 RESISTANT Resistant     ERYTHROMYCIN >=8 RESISTANT Resistant     GENTAMICIN <=0.5 SENSITIVE Sensitive     OXACILLIN 0.5 SENSITIVE Sensitive     TETRACYCLINE <=1 SENSITIVE Sensitive     VANCOMYCIN <=0.5 SENSITIVE Sensitive     TRIMETH/SULFA >=320 RESISTANT Resistant     CLINDAMYCIN <=0.25 SENSITIVE Sensitive     RIFAMPIN <=0.5 SENSITIVE Sensitive     Inducible Clindamycin NEGATIVE Sensitive     LINEZOLID 2 SENSITIVE Sensitive     * STAPHYLOCOCCUS AUREUS  Blood Culture ID Panel (Reflexed)     Status: Abnormal   Collection Time: 12/18/22  2:49 AM  Result Value Ref Range Status   Enterococcus faecalis NOT DETECTED NOT DETECTED Final   Enterococcus Faecium NOT DETECTED NOT DETECTED Final   Listeria monocytogenes NOT DETECTED NOT DETECTED Final   Staphylococcus species DETECTED (A)  NOT DETECTED Final    Comment: CRITICAL RESULT CALLED TO, READ BACK BY AND VERIFIED WITH: PHARMD C. SHADE [id number] @1714  FH    Staphylococcus aureus (BCID) DETECTED (A) NOT DETECTED Final    Comment: CRITICAL RESULT CALLED TO, READ BACK BY AND VERIFIED WITH: PHARMD C. SHADE [id number] @1714  FH    Staphylococcus epidermidis NOT DETECTED NOT DETECTED Final   Staphylococcus lugdunensis NOT DETECTED NOT DETECTED Final   Streptococcus species NOT DETECTED NOT DETECTED Final   Streptococcus agalactiae NOT DETECTED NOT DETECTED Final   Streptococcus pneumoniae NOT DETECTED NOT DETECTED Final   Streptococcus pyogenes NOT DETECTED NOT DETECTED Final   A.calcoaceticus-baumannii NOT DETECTED NOT DETECTED Final   Bacteroides fragilis NOT DETECTED NOT DETECTED Final   Enterobacterales NOT DETECTED NOT DETECTED Final   Enterobacter cloacae complex NOT DETECTED NOT DETECTED Final   Escherichia coli NOT DETECTED NOT DETECTED Final   Klebsiella aerogenes NOT DETECTED NOT DETECTED Final   Klebsiella oxytoca NOT DETECTED NOT DETECTED Final   Klebsiella pneumoniae NOT DETECTED NOT DETECTED Final   Proteus species NOT DETECTED NOT DETECTED Final   Salmonella species NOT DETECTED NOT DETECTED Final   Serratia marcescens NOT DETECTED NOT DETECTED Final   Haemophilus influenzae NOT DETECTED NOT DETECTED Final   Neisseria meningitidis NOT DETECTED NOT DETECTED Final   Pseudomonas aeruginosa NOT DETECTED NOT DETECTED Final   Stenotrophomonas maltophilia NOT DETECTED NOT DETECTED Final   Candida albicans NOT DETECTED NOT DETECTED Final   Candida auris NOT DETECTED NOT DETECTED Final   Candida glabrata NOT DETECTED NOT DETECTED Final   Candida krusei  NOT DETECTED NOT DETECTED Final   Candida parapsilosis NOT DETECTED NOT DETECTED Final   Candida tropicalis NOT DETECTED NOT DETECTED Final   Cryptococcus neoformans/gattii NOT DETECTED NOT DETECTED Final   Meth resistant mecA/C and MREJ NOT DETECTED NOT DETECTED  Final    Comment: Performed at [facility], [address], [place], [place] [address]  MRSA Next Gen by PCR, Nasal     Status: None   Collection Time: 12/18/22  1:01 PM   Specimen: Nasal Mucosa; Nasal Swab  Result Value Ref Range Status   MRSA by PCR Next Gen NOT DETECTED NOT DETECTED Final    Comment: (NOTE) The GeneXpert MRSA Assay (FDA approved for NASAL specimens only), is one component of a comprehensive MRSA colonization surveillance program. It is not intended to diagnose MRSA infection nor to guide or monitor treatment for MRSA infections. Test performance is not FDA approved in patients less than 57 years old. Performed at [facility], [address], [place], [place] [address]   Culture, blood (Routine X 2) w Reflex to ID Panel     Status: None   Collection Time: 12/19/22  3:33 AM   Specimen: BLOOD RIGHT HAND  Result Value Ref Range Status   Specimen Description   Final    BLOOD RIGHT HAND Performed at [facility], [address] [place], [place] [address]    Special Requests   Final    BOTTLES DRAWN AEROBIC AND ANAEROBIC Blood Culture adequate volume Performed at [facility], [address], [place], [place] [address]    Culture   Final    NO GROWTH 5 DAYS Performed at [facility], [address], [place], [place] [address]    Report Status 12/24/2022 FINAL  Final  Culture, blood (Routine X 2) w Reflex to ID Panel     Status: None   Collection Time: 12/19/22  3:33 AM   Specimen: BLOOD RIGHT FOREARM  Result Value Ref Range Status   Specimen Description   Final    BLOOD RIGHT FOREARM Performed at [facility], [address], [place], [place] [address]    Special Requests   Final    BOTTLES DRAWN AEROBIC AND ANAEROBIC Blood Culture adequate volume Performed at [facility], [address], [place], [place] [address]    Culture   Final    NO GROWTH 5 DAYS Performed at [facility], [address] [place], [place] [address]    Report Status 12/24/2022 FINAL  Final     Rexene Alberts, MSN, NP-C [facility]  [email or web address] Pager: [phone] Office: [phone] [facility] Main Line: [phone] *Secure Chat Communication Welcome

## 2022-12-24 NOTE — Progress Notes (Signed)
Occupational Therapy Treatment Patient Details Name: Jeffrey [name] MRN: [id number] DOB: 1966-03-05 Today's Date: 12/24/2022   History of present illness Patient is a 57 year old male who presented to the hospital on 9/25 with fevers and profound weakness. Patient was admitted with hypotension with pressors started, septic shock secondary to MSSA bacteremia, AKI. PMH: opioid dependence, diabetes, anxiety, PTSD.   OT comments  Patient supine in bed upon arrival and agreeable to participate in OT intervention.  Patient completed supine to sitting EOB with HOB and Supervision. Patient requires total A for donning R sock 2/2 patient reporting he is unable to bend knee enough to complete task. Sit to stand and functional mobility to bedside chair completed  with RW and CGA.  Patient completed UB grooming seated with Supervision 2/2 patient reporting he doesn't want R knee to give out while standing at sink.  Patient would benefit from additional OT intervention  to address functional deficits in order for patient to return to PLOF.      If plan is discharge home, recommend the following:  A little help with walking and/or transfers;Assistance with cooking/housework;Direct supervision/assist for medications management;Direct supervision/assist for financial management;Assist for transportation;Help with stairs or ramp for entrance;A little help with bathing/dressing/bathroom   Equipment Recommendations       Recommendations for Other Services      Precautions / Restrictions Precautions Precautions: Fall Precaution Comments: bad RIGHT knee Restrictions Weight Bearing Restrictions: No       Mobility Bed Mobility Overal bed mobility: Modified Independent (HOB elevated) Bed Mobility: Supine to Sit     Supine to sit: Modified independent (Device/Increase time), HOB elevated          Transfers Overall transfer level: Needs assistance Equipment used: Rolling walker (2  wheels) Transfers: Sit to/from Stand Sit to Stand: Contact guard assist     Step pivot transfers: Contact guard assist           Balance Overall balance assessment: Needs assistance                                         ADL either performed or assessed with clinical judgement   ADL Overall ADL's : Needs assistance/impaired Eating/Feeding: Supervision/ safety;Sitting   Grooming: Wash/dry hands;Wash/dry face;Sitting;Supervision/safety               Lower Body Dressing: Total assistance (sitting to don R sock; patient reporting he is unable to bend R knee enough to don sock)               Functional mobility during ADLs: Contact guard assist;Rolling walker (2 wheels)      Extremity/Trunk Assessment Upper Extremity Assessment Upper Extremity Assessment: Generalized weakness            Vision       Perception     Praxis      Cognition Arousal: Alert Behavior During Therapy: WFL for tasks assessed/performed Overall Cognitive Status: Within Functional Limits for tasks assessed                                 General Comments: patient was plesant, motivated and cooperative during session        Exercises      Shoulder Instructions       General Comments  Pertinent Vitals/ Pain       Pain Assessment Pain Assessment: 0-10 Pain Score: 3  Faces Pain Scale: Hurts little more Pain Location: R knee Pain Descriptors / Indicators: Aching Pain Intervention(s): Monitored during session  Home Living                                          Prior Functioning/Environment              Frequency  Min 1X/week        Progress Toward Goals  OT Goals(current goals can now be found in the care plan section)  Progress towards OT goals: Progressing toward goals  Acute Rehab OT Goals OT Goal Formulation: With patient Time For Goal Achievement: 01/04/23 Potential to Achieve Goals:  Fair ADL Goals Pt Will Perform Lower Body Dressing: with supervision;sitting/lateral leans;with adaptive equipment Pt Will Transfer to Toilet: with supervision;ambulating;regular height toilet Pt Will Perform Toileting - Clothing Manipulation and hygiene: with supervision;sitting/lateral leans  Plan      Co-evaluation                 AM-PAC OT "6 Clicks" Daily Activity     Outcome Measure   Help from another person eating meals?: None Help from another person taking care of personal grooming?: A Little Help from another person toileting, which includes using toliet, bedpan, or urinal?: A Little Help from another person bathing (including washing, rinsing, drying)?: A Lot Help from another person to put on and taking off regular upper body clothing?: A Little Help from another person to put on and taking off regular lower body clothing?: A Lot 6 Click Score: 17    End of Session Equipment Utilized During Treatment: Rolling walker (2 wheels)  OT Visit Diagnosis: Unsteadiness on feet (R26.81);Other abnormalities of gait and mobility (R26.89);Muscle weakness (generalized) (M62.81);Pain   Activity Tolerance Patient tolerated treatment well   Patient Left with call bell/phone within reach;with chair alarm set;in chair   Nurse Communication Mobility status        Time: 1610-9604 OT Time Calculation (min): 25 min  Charges: OT General Charges $OT Visit: 1 Visit OT Treatments $Self Care/Home Management : 8-22 mins  Governor Specking OT/L  Denice Paradise 12/24/2022, 12:59 PM

## 2022-12-24 NOTE — Progress Notes (Signed)
PROGRESS NOTE    Jeffrey [name]  JJO:[id number] DOB: April 27, 1965 DOA: 12/18/2022 PCP: Tresa Garter, MD    Brief Narrative:  58 year old white male BMI 32 prior PTSD chronic BZD, opioid dependence previously on methadone.  Presented to the ED 9/25 fatigue nausea vomiting, SOB, Tmax 100.8 tachycardic slightly low blood pressure lactic acid 2.9-hypotension progressed to became septic started on Neo-Synephrine transferred to ICU.   BC ID MSSA. Tx to [facility] for TEE on 10/1.   Assessment and Plan: Sepsis secondary to infected pacemaker wires? /Myositis based on CT scan of right knee -knee better-- able to bear weight Continuing Ancef Q8  -ID consulted -s/p TEE on 10/1 -EP following   Acute thrombocytopenia on admission likely from mild DIC now resolving -trending up   AKI on admission with hyponatremia/hypokalemia and metabolic acidosis -resolved   Diabetic ulcer managed by podiatrist Wound looks well-healed on right lower extremity-outpatient follow-up with Dr. 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   Weight as of this encounter: 109.4 kg.   Diabetes mellitus TY 2 Resume metformin 500 twice daily this hospital stay-was not taking at home -SSI and long acting   HTN -half norvacs as BP on lower end   Chronic pain Methadone dependence Continue methadone 150 has been on this for many many years- - continue Celexa 40 daily    DVT prophylaxis: enoxaparin (LOVENOX) injection 40 mg Start: 12/18/22 1000 SCDs Start: 12/18/22 0931    Code Status: Full Code   Disposition Plan:  Level of care: Telemetry Cardiac Status is: Inpatient Remains inpatient appropriate    Consultants:  ID EP   Subjective: No SOB, no CP-- able to bear weight on left knee  Objective: Vitals:   12/23/22 2009 12/24/22 0027  12/24/22 0341 12/24/22 0752  BP: 113/63 99/63 (!) 101/59 113/65  Pulse: 60 60 60 60  Resp: 16 19  14   Temp: 99.1 F (37.3 C) 98 F (36.7 C) 97.8 F (36.6 C) 98.3 F (36.8 C)  TempSrc: Oral Oral Oral Oral  SpO2: 94% 94% 95%   Weight:   109.4 kg   Height:        Intake/Output Summary (Last 24 hours) at 12/24/2022 1026 Last data filed at 12/24/2022 0600 Gross per 24 hour  Intake 200 ml  Output 600 ml  Net -400 ml   Filed Weights   12/18/22 0231 12/18/22 1305 12/24/22 0341  Weight: 117.9 kg 111.5 kg 109.4 kg    Examination:   General: Appearance:    Obese male in no acute distress     Lungs:      respirations unlabored  Heart:    Normal heart rate. Normal rhythm. No murmurs, rubs, or gallops.    MS:   All extremities are intact.    Neurologic:   Awake, alert, oriented x 3. No apparent focal neurological           defect.        Data Reviewed: I have personally reviewed following labs and imaging studies  CBC: Recent Labs  Lab 12/18/22 0241 12/18/22 1616 12/19/22 0520 12/20/22 0553 12/23/22 0522 12/24/22 0346  WBC 10.1 20.4* 18.2* 8.8 7.6 8.0  NEUTROABS 9.2*  --   --   --  5.6  --   HGB 14.5 11.7* 11.7* 11.2* 10.7*  10.5*  HCT 42.7 35.7* 35.4* 33.4* 33.3* 32.8*  MCV 84.7 86.9 87.4 87.7 88.1 88.4  PLT 170 125* 109* 87* 100* 114*   Basic Metabolic Panel: Recent Labs  Lab 12/18/22 1616 12/19/22 0755 12/20/22 0553 12/22/22 0931 12/23/22 0522 12/23/22 1116 12/24/22 0346  NA 133* 130* 131* 139 134* 136 137  K 3.3* 3.7 3.6 3.0* 3.7 3.3* 3.3*  CL 104 103 102 104 101 102 104  CO2 17* 19* 22 28 26 24 26   GLUCOSE 254* 269* 252* 109* 165* 122* 83  BUN 16 23* 22* 10 8 9 9   CREATININE 1.52* 1.32* 0.99 0.82 0.58* 0.76 1.06  CALCIUM 7.0* 6.8* 7.1* 7.2* 7.6* 7.8* 7.5*  MG 1.0* 2.0 2.2 1.8 2.3  --   --   PHOS <1.0* 3.0 1.8* 2.4* 3.3  --   --    GFR: Estimated Creatinine Clearance: 97.3 mL/min (by C-G formula based on SCr of 1.06 mg/dL). Liver Function  Tests: Recent Labs  Lab 12/18/22 0241 12/18/22 1616 12/23/22 1116 12/24/22 0346  AST 59* 69* 55* 43*  ALT 30 32 16 14  ALKPHOS 91 46 115 93  BILITOT 1.3* 1.6* 0.9 1.2  PROT 7.7 5.6* 6.4* 5.7*  ALBUMIN 3.2* 2.3* 2.6* 2.1*   Recent Labs  Lab 12/18/22 1616  LIPASE 17  AMYLASE 32   No results for input(s): "AMMONIA" in the last 168 hours. Coagulation Profile: Recent Labs  Lab 12/18/22 0241  INR 1.1   Cardiac Enzymes: No results for input(s): "CKTOTAL", "CKMB", "CKMBINDEX", "TROPONINI" in the last 168 hours. BNP (last 3 results) No results for input(s): "PROBNP" in the last 8760 hours. HbA1C: No results for input(s): "HGBA1C" in the last 72 hours. CBG: Recent Labs  Lab 12/23/22 0744 12/23/22 1142 12/23/22 1612 12/23/22 2127 12/24/22 0752  GLUCAP 132* 112* 87 113* 90   Lipid Profile: No results for input(s): "CHOL", "HDL", "LDLCALC", "TRIG", "CHOLHDL", "LDLDIRECT" in the last 72 hours. Thyroid Function Tests: No results for input(s): "TSH", "T4TOTAL", "FREET4", "T3FREE", "THYROIDAB" in the last 72 hours. Anemia Panel: No results for input(s): "VITAMINB12", "FOLATE", "FERRITIN", "TIBC", "IRON", "RETICCTPCT" in the last 72 hours. Sepsis Labs: Recent Labs  Lab 12/18/22 0450 12/18/22 1616 12/18/22 2131 12/19/22 0015  LATICACIDVEN 2.6* 5.8* 3.4* 3.0*    Recent Results (from the past 240 hour(s))  Resp panel by RT-PCR (RSV, Flu A&B, Covid) Anterior Nasal Swab     Status: None   Collection Time: 12/18/22  2:39 AM   Specimen: Anterior Nasal Swab  Result Value Ref Range Status   SARS Coronavirus 2 by RT PCR NEGATIVE NEGATIVE Final    Comment: (NOTE) SARS-CoV-2 target nucleic acids are NOT DETECTED.  The SARS-CoV-2 RNA is generally detectable in upper respiratory specimens during the acute phase of infection. The lowest concentration of SARS-CoV-2 viral copies this assay can detect is 138 copies/mL. A negative result does not preclude SARS-Cov-2 infection and  should not be used as the sole basis for treatment or other patient management decisions. A negative result may occur with  improper specimen collection/handling, submission of specimen other than nasopharyngeal swab, presence of viral mutation(s) within the areas targeted by this assay, and inadequate number of viral copies(<138 copies/mL). A negative result must be combined with clinical observations, patient history, and epidemiological information. The expected result is Negative.  Fact Sheet for Patients:  BloggerCourse.com  Fact Sheet for Healthcare Providers:  SeriousBroker.it  This test is no t yet approved or cleared by the [place] FDA  and  has been authorized for detection and/or diagnosis of SARS-CoV-2 by FDA under an Emergency Use Authorization (EUA). This EUA will remain  in effect (meaning this test can be used) for the duration of the COVID-19 declaration under Section 564(b)(1) of the Act, 21 U.S.C.section 360bbb-3(b)(1), unless the authorization is terminated  or revoked sooner.       Influenza A by PCR NEGATIVE NEGATIVE Final   Influenza B by PCR NEGATIVE NEGATIVE Final    Comment: (NOTE) The Xpert Xpress SARS-CoV-2/FLU/RSV plus assay is intended as an aid in the diagnosis of influenza from Nasopharyngeal swab specimens and should not be used as a sole basis for treatment. Nasal washings and aspirates are unacceptable for Xpert Xpress SARS-CoV-2/FLU/RSV testing.  Fact Sheet for Patients: BloggerCourse.com  Fact Sheet for Healthcare Providers: SeriousBroker.it  This test is not yet approved or cleared by the [place] FDA and has been authorized for detection and/or diagnosis of SARS-CoV-2 by FDA under an Emergency Use Authorization (EUA). This EUA will remain in effect (meaning this test can be used) for the duration of the COVID-19 declaration  under Section 564(b)(1) of the Act, 21 U.S.C. section 360bbb-3(b)(1), unless the authorization is terminated or revoked.     Resp Syncytial Virus by PCR NEGATIVE NEGATIVE Final    Comment: (NOTE) Fact Sheet for Patients: BloggerCourse.com  Fact Sheet for Healthcare Providers: SeriousBroker.it  This test is not yet approved or cleared by the [place] FDA and has been authorized for detection and/or diagnosis of SARS-CoV-2 by FDA under an Emergency Use Authorization (EUA). This EUA will remain in effect (meaning this test can be used) for the duration of the COVID-19 declaration under Section 564(b)(1) of the Act, 21 U.S.C. section 360bbb-3(b)(1), unless the authorization is terminated or revoked.  Performed at [facility], [address], [place], [place] [address]   Culture, blood (Routine x 2)     Status: Abnormal   Collection Time: 12/18/22  2:41 AM   Specimen: BLOOD RIGHT FOREARM  Result Value Ref Range Status   Specimen Description   Final    BLOOD RIGHT FOREARM Performed at [facility], [address], [place], [place] [address]    Special Requests   Final    BOTTLES DRAWN AEROBIC AND ANAEROBIC Blood Culture results may not be optimal due to an excessive volume of blood received in culture bottles Performed at [facility], [address] [place], [place] [address]    Culture  Setup Time   Final    GRAM POSITIVE COCCI IN CLUSTERS IN BOTH AEROBIC AND ANAEROBIC BOTTLES CRITICAL VALUE NOTED.  VALUE IS CONSISTENT WITH PREVIOUSLY REPORTED AND CALLED VALUE.    Culture (A)  Final    STAPHYLOCOCCUS AUREUS SUSCEPTIBILITIES PERFORMED ON PREVIOUS CULTURE WITHIN THE LAST 5 DAYS. Performed at [facility], [address], [place], [place] [address]    Report Status 12/20/2022 FINAL  Final  Culture, blood (Routine x 2)     Status: Abnormal   Collection Time: 12/18/22  2:49  AM   Specimen: BLOOD LEFT FOREARM  Result Value Ref Range Status   Specimen Description   Final    BLOOD LEFT FOREARM Performed at [facility], [address] [place], [place] [address]    Special Requests   Final    BOTTLES DRAWN AEROBIC AND ANAEROBIC Blood Culture results may not be optimal due to an excessive volume of blood received in culture bottles Performed  at [facility], [address] [place], [place] [address]    Culture  Setup Time   Final    GRAM POSITIVE COCCI IN CLUSTERS IN BOTH AEROBIC AND ANAEROBIC BOTTLES CRITICAL RESULT CALLED TO, READ BACK BY AND VERIFIED WITH: PHARMD C. Clearance Coots [id number] @1714  FH Performed at [facility], [address] [place], [place] [address]    Culture STAPHYLOCOCCUS AUREUS (A)  Final   Report Status 12/20/2022 FINAL  Final   Organism ID, Bacteria STAPHYLOCOCCUS AUREUS  Final      Susceptibility   Staphylococcus aureus - MIC*    CIPROFLOXACIN >=8 RESISTANT Resistant     ERYTHROMYCIN >=8 RESISTANT Resistant     GENTAMICIN <=0.5 SENSITIVE Sensitive     OXACILLIN 0.5 SENSITIVE Sensitive     TETRACYCLINE <=1 SENSITIVE Sensitive     VANCOMYCIN <=0.5 SENSITIVE Sensitive     TRIMETH/SULFA >=320 RESISTANT Resistant     CLINDAMYCIN <=0.25 SENSITIVE Sensitive     RIFAMPIN <=0.5 SENSITIVE Sensitive     Inducible Clindamycin NEGATIVE Sensitive     LINEZOLID 2 SENSITIVE Sensitive     * STAPHYLOCOCCUS AUREUS  Blood Culture ID Panel (Reflexed)     Status: Abnormal   Collection Time: 12/18/22  2:49 AM  Result Value Ref Range Status   Enterococcus faecalis NOT DETECTED NOT DETECTED Final   Enterococcus Faecium NOT DETECTED NOT DETECTED Final   Listeria monocytogenes NOT DETECTED NOT DETECTED Final   Staphylococcus species DETECTED (A) NOT DETECTED Final    Comment: CRITICAL RESULT CALLED TO, READ BACK BY AND VERIFIED WITH: PHARMD C. SHADE [id number] @1714  FH    Staphylococcus aureus (BCID) DETECTED (A) NOT DETECTED  Final    Comment: CRITICAL RESULT CALLED TO, READ BACK BY AND VERIFIED WITH: PHARMD C. SHADE [id number] @1714  FH    Staphylococcus epidermidis NOT DETECTED NOT DETECTED Final   Staphylococcus lugdunensis NOT DETECTED NOT DETECTED Final   Streptococcus species NOT DETECTED NOT DETECTED Final   Streptococcus agalactiae NOT DETECTED NOT DETECTED Final   Streptococcus pneumoniae NOT DETECTED NOT DETECTED Final   Streptococcus pyogenes NOT DETECTED NOT DETECTED Final   A.calcoaceticus-baumannii NOT DETECTED NOT DETECTED Final   Bacteroides fragilis NOT DETECTED NOT DETECTED Final   Enterobacterales NOT DETECTED NOT DETECTED Final   Enterobacter cloacae complex NOT DETECTED NOT DETECTED Final   Escherichia coli NOT DETECTED NOT DETECTED Final   Klebsiella aerogenes NOT DETECTED NOT DETECTED Final   Klebsiella oxytoca NOT DETECTED NOT DETECTED Final   Klebsiella pneumoniae NOT DETECTED NOT DETECTED Final   Proteus species NOT DETECTED NOT DETECTED Final   Salmonella species NOT DETECTED NOT DETECTED Final   Serratia marcescens NOT DETECTED NOT DETECTED Final   Haemophilus influenzae NOT DETECTED NOT DETECTED Final   Neisseria meningitidis NOT DETECTED NOT DETECTED Final   Pseudomonas aeruginosa NOT DETECTED NOT DETECTED Final   Stenotrophomonas maltophilia NOT DETECTED NOT DETECTED Final   Candida albicans NOT DETECTED NOT DETECTED Final   Candida auris NOT DETECTED NOT DETECTED Final   Candida glabrata NOT DETECTED NOT DETECTED Final   Candida krusei NOT DETECTED NOT DETECTED Final   Candida parapsilosis NOT DETECTED NOT DETECTED Final   Candida tropicalis NOT DETECTED NOT DETECTED Final   Cryptococcus neoformans/gattii NOT DETECTED NOT DETECTED Final   Meth resistant mecA/C and MREJ NOT DETECTED NOT DETECTED Final    Comment: Performed at [facility], [address] [place], [place] [address]  MRSA Next Gen by PCR, Nasal  Status: None   Collection Time: 12/18/22  1:01 PM    Specimen: Nasal Mucosa; Nasal Swab  Result Value Ref Range Status   MRSA by PCR Next Gen NOT DETECTED NOT DETECTED Final    Comment: (NOTE) The GeneXpert MRSA Assay (FDA approved for NASAL specimens only), is one component of a comprehensive MRSA colonization surveillance program. It is not intended to diagnose MRSA infection nor to guide or monitor treatment for MRSA infections. Test performance is not FDA approved in patients less than 65 years old. Performed at [facility], [address], [place], [place] [address]   Culture, blood (Routine X 2) w Reflex to ID Panel     Status: None   Collection Time: 12/19/22  3:33 AM   Specimen: BLOOD RIGHT HAND  Result Value Ref Range Status   Specimen Description   Final    BLOOD RIGHT HAND Performed at [facility], [address], [place], [place] [address]    Special Requests   Final    BOTTLES DRAWN AEROBIC AND ANAEROBIC Blood Culture adequate volume Performed at [facility], [address], [place], [place] [address]    Culture   Final    NO GROWTH 5 DAYS Performed at [facility], [address] [place], [place] [address]    Report Status 12/24/2022 FINAL  Final  Culture, blood (Routine X 2) w Reflex to ID Panel     Status: None   Collection Time: 12/19/22  3:33 AM   Specimen: BLOOD RIGHT FOREARM  Result Value Ref Range Status   Specimen Description   Final    BLOOD RIGHT FOREARM Performed at [facility], [address] [place], [place] [address]    Special Requests   Final    BOTTLES DRAWN AEROBIC AND ANAEROBIC Blood Culture adequate volume Performed at [facility], [address] [place], [place] [address]    Culture   Final    NO GROWTH 5 DAYS Performed at [facility], [address] [place], [place] [address]    Report Status 12/24/2022 FINAL  Final         Radiology Studies: ECHO TEE  Result Date: 12/23/2022     TRANSESOPHOGEAL ECHO REPORT   Patient Name:   Jeffrey [name] Date of Exam: 12/23/2022 Medical Rec #:  [id number]         Height:       73.0 in Accession #:    [id number]        Weight:       245.8 lb Date of Birth:  05/08/1965         BSA:          2.349 m Patient Age:    57 years          BP:           108/71 mmHg Patient Gender: M                 HR:           73 bpm. Exam Location:  Inpatient Procedure: Transesophageal Echo and Limited Color Doppler Indications:     Bacteremia  History:         Patient has prior history of Echocardiogram examinations, most                  recent 12/19/2022. Pacemaker; Risk Factors:h/o substance abuse.  Sonographer:  Thurman Coyer RDCS Referring Phys:  [id number] [facility] Diagnosing Phys: [place] PROCEDURE: The transesophogeal probe was passed without difficulty through the esophogus of the patient. Sedation performed by different physician. The patient was monitored while under deep sedation. Anesthestetic sedation was provided intravenously by Anesthesiology: 300mg  of Propofol, 100mg  of Lidocaine. The patient developed no complications during the procedure.  IMPRESSIONS  1. Left ventricular ejection fraction, by estimation, is 60 to 65%. The left ventricle has normal function.  2. Right ventricular systolic function is normal. The right ventricular size is normal.  3. No left atrial/left atrial appendage thrombus was detected.  4. The mitral valve is normal in structure. Trivial mitral valve regurgitation.  5. The aortic valve is normal in structure. Aortic valve regurgitation is not visualized.  6. There is mild (Grade II) plaque. Conclusion(s)/Recommendation(s): Small strand coming from the right atrial lead, can be fibrinous materal (slide 33) (if bacteremia is persistent, can consider repeat study). FINDINGS  Left Ventricle: Left ventricular ejection fraction, by estimation, is 60 to 65%. The left ventricle has normal function. The left ventricular internal  cavity size was normal in size. Right Ventricle: The right ventricular size is normal. Right ventricular systolic function is normal. Left Atrium: No left atrial/left atrial appendage thrombus was detected. Pericardium: There is no evidence of pericardial effusion. Mitral Valve: The mitral valve is normal in structure. Trivial mitral valve regurgitation. Tricuspid Valve: The tricuspid valve is normal in structure. Tricuspid valve regurgitation is mild. Aortic Valve: The aortic valve is normal in structure. Aortic valve regurgitation is not visualized. Pulmonic Valve: The pulmonic valve was normal in structure. Pulmonic valve regurgitation is not visualized. Aorta: The aortic root is normal in size and structure. There is mild (Grade II) plaque. IAS/Shunts: No atrial level shunt detected by color flow Doppler. Additional Comments: A device lead is visualized. Carolan Clines Electronically signed by Carolan Clines Signature Date/Time: 12/23/2022/3:42:06 PM    Final    EP STUDY  Result Date: 12/23/2022 See surgical note for result.  CT KNEE RIGHT W CONTRAST  Addendum Date: 12/22/2022   ADDENDUM REPORT: 12/22/2022 15:50 ADDENDUM: The original report was by Dr. Gaylyn Rong. The following addendum is by Dr. Gaylyn Rong: This is to document that we obtained multiplanar reformatted images to better align with the joint (series 11 through series 605). These better show mild medial compartmental articular space narrowing favoring mild degenerative chondral thinning in the medial compartment. Otherwise, no change to the original report findings or conclusions. Electronically Signed   By: Gaylyn Rong M.D.   On: 12/22/2022 15:50   Result Date: 12/22/2022 CLINICAL DATA:  Infection EXAM: CT OF THE RIGHT KNEE WITH CONTRAST TECHNIQUE: Multidetector CT imaging was performed following the standard protocol during bolus administration of intravenous contrast. RADIATION DOSE REDUCTION: This exam was performed  according to the departmental dose-optimization program which includes automated exposure control, adjustment of the mA and/or kV according to patient size and/or use of iterative reconstruction technique. CONTRAST:  OMNIPAQUE IOHEXOL 300 MG/ML  SOLN COMPARISON:  None Available. FINDINGS: Bones/Joint/Cartilage Mild marginal spurring in the patella. No fracture or acute bony findings. No bony destructive lesion or focal bony demineralization to indicate osteomyelitis. No effusion. Ligaments Suboptimally assessed by CT. Muscles and Tendons Equivocal effacement of fat stranding in the tibialis anterior muscle, strictly speaking I cannot exclude myositis. MRI would be more definitive in this assessment. Soft tissues No abscess. Subcutaneous edema anterolaterally along the knee and calf. No subcutaneous gas observed. IMPRESSION: 1.  Subcutaneous edema anterolaterally along the knee and calf. No abscess or subcutaneous gas. 2. Equivocal effacement of fat stranding in the tibialis anterior muscle, strictly speaking I cannot exclude myositis. MRI would be more definitive in this assessment. 3. Mild marginal spurring in the patella. Electronically Signed: By: Gaylyn Rong M.D. On: 12/22/2022 14:29        Scheduled Meds:  amLODipine  10 mg Oral Daily   atenolol  50 mg Oral Daily   Chlorhexidine Gluconate Cloth  6 each Topical Daily   citalopram  40 mg Oral Daily   clonazePAM  1 mg Oral BID   colchicine  0.6 mg Oral Daily   enoxaparin (LOVENOX) injection  40 mg Subcutaneous Daily   insulin aspart  0-15 Units Subcutaneous TID WC   insulin aspart  0-5 Units Subcutaneous QHS   insulin glargine-yfgn  11 Units Subcutaneous Daily   metFORMIN  500 mg Oral BID WC   methadone  150 mg Oral Daily   metoprolol tartrate  2.5 mg Intravenous Once   sodium chloride flush  10-40 mL Intracatheter Q12H   Continuous Infusions:  sodium chloride Stopped (12/18/22 1346)   sodium chloride     sodium chloride  Stopped (12/19/22 1510)    ceFAZolin (ANCEF) IV 2 g (12/24/22 0338)     LOS: 6 days    Time spent: 45 minutes spent on chart review, discussion with nursing staff, consultants, updating family and interview/physical exam; more than 50% of that time was spent in counseling and/or coordination of care.    Joseph Art, DO [facility] Available via Epic secure chat 7am-7pm After these hours, please refer to coverage provider listed on [email or web address] 12/24/2022, 10:26 AM

## 2022-12-24 NOTE — TOC Initial Note (Addendum)
Transition of Care [facility]) - Initial/Assessment Note    Patient Details  Name: Jeffrey [name] MRN: [id number] Date of Birth: [birth year]  Transition of Care [facility]) CM/SW Contact:    Delilah Shan, LCSWA Phone Number: 12/24/2022, 3:08 PM  Clinical Narrative:                   CSW received consult for possible SNF placement at time of discharge. CSW spoke with patient regarding PT recommendation of SNF placement at time of discharge. CSW discussed insurance progress.Patient expressed understanding of PT recommendation and politely declined SNF placement at time of discharge.Due to benefits and possible out of pocket expense. Patient agreeable for CSW to reach out to [facility] to see if PT can come work with him until ready for dc. CSW messaged Jessie with [facility] leadership to see if patient can be screened for [facility]. Patient plans to return home with support of parents at dc.  No further questions reported at this time. CSW to continue to follow and assist with discharge planning needs.   Expected Discharge Plan: Home/Self Care Barriers to Discharge: Continued Medical Work up   Patient Goals and CMS Choice Patient states their goals for this hospitalization and ongoing recovery are:: to return home   Choice offered to / list presented to : Patient      Expected Discharge Plan and Services In-house Referral: Clinical Social Work Discharge Planning Services: CM Consult   Living arrangements for the past 2 months: Single Family Home                                      Prior Living Arrangements/Services Living arrangements for the past 2 months: Single Family Home Lives with:: Parents Patient language and need for interpreter reviewed:: Yes Do you feel safe going back to the place where you live?: Yes      Need for Family Participation in Patient Care: Yes (Comment) Care giver support system in place?: Yes (comment) Current home services:  (n/a) Criminal  Activity/Legal Involvement Pertinent to Current Situation/Hospitalization: No - Comment as needed  Activities of Daily Living   ADL Screening (condition at time of admission) Independently performs ADLs?: Yes (appropriate for developmental age) Does the patient have a NEW difficulty with bathing/dressing/toileting/self-feeding that is expected to last >3 days?: Yes (Initiates electronic notice to provider for possible OT consult) Does the patient have a NEW difficulty with getting in/out of bed, walking, or climbing stairs that is expected to last >3 days?: Yes (Initiates electronic notice to provider for possible PT consult) Does the patient have a NEW difficulty with communication that is expected to last >3 days?: No Is the patient deaf or have difficulty hearing?: No Does the patient have difficulty seeing, even when wearing glasses/contacts?: No Does the patient have difficulty concentrating, remembering, or making decisions?: No  Permission Sought/Granted Permission sought to share information with : Case Manager, Magazine features editor, Family Supports Permission granted to share information with : Yes, Verbal Permission Granted  Share Information with NAME: Annia Friendly     Permission granted to share info w Relationship: father  Permission granted to share info w Contact Information: Annia Friendly [phone]  Emotional Assessment Appearance:: Appears stated age Attitude/Demeanor/Rapport: Gracious Affect (typically observed): Calm Orientation: : Oriented to Self, Oriented to Place, Oriented to  Time, Oriented to Situation Alcohol / Substance Use: Not Applicable Psych Involvement: No (comment)  Admission  diagnosis:  Sepsis (HCC) [A41.9] Shock (HCC) [R57.9] Patient Active Problem List   Diagnosis Date Noted   Bacteremia 12/23/2022   Sepsis (HCC) 12/18/2022   Shock (HCC) 12/18/2022   Hypovolemia 12/18/2022   Vitamin D deficiency 09/24/2022   Diabetic ulcer of right foot (HCC)  09/23/2022   Shingles rash 05/21/2022   Type 2 diabetes mellitus with hyperglycemia (HCC) 05/12/2021   Neoplasm of uncertain behavior of skin 02/04/2021   Sinusitis 09/02/2020   HTN (hypertension) 02/14/2020   Change in bowel habit 11/10/2019   Health care maintenance 08/10/2019   Obesity (BMI 35.0-39.9 without comorbidity) 11/22/2018   Cardiac pacemaker in situ 08/14/2017   Cerumen impaction 10/29/2016   Tremor 07/17/2016   Long Q-T syndrome 12/19/2014   PTSD (post-traumatic stress disorder) 12/19/2014   Opioid dependence (HCC) 12/19/2014   PCP:  Tresa Garter, MD Pharmacy:   [facility], [place] - [address] [place] [address] Phone: [phone] Fax: [phone]     Social Determinants of Health (SDOH) Social History: SDOH Screenings   Food Insecurity: No Food Insecurity (12/21/2022)  Housing: Low Risk  (12/21/2022)  Transportation Needs: No Transportation Needs (12/21/2022)  Utilities: Not At Risk (12/21/2022)  Depression (PHQ2-9): Low Risk  (09/23/2022)  Tobacco Use: High Risk (12/20/2022)   SDOH Interventions: Food Insecurity Interventions: Intervention Not Indicated, Inpatient TOC Housing Interventions: Intervention Not Indicated, Inpatient TOC Transportation Interventions: Intervention Not Indicated, Inpatient TOC Utilities Interventions: Intervention Not Indicated, Inpatient TOC   Readmission Risk Interventions    12/21/2022   12:45 PM  Readmission Risk Prevention Plan  Transportation Screening Complete  PCP or Specialist Appt within 5-7 Days Complete  Home Care Screening Complete  Medication Review (RN CM) Complete

## 2022-12-25 ENCOUNTER — Encounter (HOSPITAL_COMMUNITY)
Admission: EM | Disposition: A | Discharge status: Home / Self Care | Payer: Self-pay | Source: Home / Self Care | Attending: Internal Medicine

## 2022-12-25 DIAGNOSIS — T827XXD Infection and inflammatory reaction due to other cardiac and vascular devices, implants and grafts, subsequent encounter: Secondary | ICD-10-CM | POA: Diagnosis not present

## 2022-12-25 DIAGNOSIS — R072 Precordial pain: Secondary | ICD-10-CM | POA: Diagnosis not present

## 2022-12-25 DIAGNOSIS — A419 Sepsis, unspecified organism: Secondary | ICD-10-CM | POA: Diagnosis not present

## 2022-12-25 DIAGNOSIS — T827XXA Infection and inflammatory reaction due to other cardiac and vascular devices, implants and grafts, initial encounter: Secondary | ICD-10-CM

## 2022-12-25 DIAGNOSIS — R6520 Severe sepsis without septic shock: Secondary | ICD-10-CM | POA: Diagnosis not present

## 2022-12-25 HISTORY — PX: LEFT HEART CATH AND CORONARY ANGIOGRAPHY: CATH118249

## 2022-12-25 LAB — COMPREHENSIVE METABOLIC PANEL
ALT: 13 U/L (ref 0–44)
AST: 46 U/L — ABNORMAL HIGH (ref 15–41)
Albumin: 2.4 g/dL — ABNORMAL LOW (ref 3.5–5.0)
Alkaline Phosphatase: 104 U/L (ref 38–126)
Anion gap: 10 (ref 5–15)
BUN: 12 mg/dL (ref 6–20)
CO2: 23 mmol/L (ref 22–32)
Calcium: 7.6 mg/dL — ABNORMAL LOW (ref 8.9–10.3)
Chloride: 102 mmol/L (ref 98–111)
Creatinine, Ser: 1.09 mg/dL (ref 0.61–1.24)
GFR, Estimated: >60 mL/min (ref >60)
Glucose, Bld: 98 mg/dL (ref 70–99)
Potassium: 3.6 mmol/L (ref 3.5–5.1)
Sodium: 135 mmol/L (ref 135–145)
Total Bilirubin: 1 mg/dL (ref 0.3–1.2)
Total Protein: 6.6 g/dL (ref 6.5–8.1)

## 2022-12-25 LAB — CBC
HCT: 37.4 % — ABNORMAL LOW (ref 39.0–52.0)
Hemoglobin: 11.9 g/dL — ABNORMAL LOW (ref 13.0–17.0)
MCH: 28.6 pg (ref 26.0–34.0)
MCHC: 31.8 g/dL (ref 30.0–36.0)
MCV: 89.9 fL (ref 80.0–100.0)
Platelets: 166 10*3/uL (ref 150–400)
RBC: 4.16 MIL/uL — ABNORMAL LOW (ref 4.22–5.81)
RDW: 13.5 % (ref 11.5–15.5)
WBC: 12.5 10*3/uL — ABNORMAL HIGH (ref 4.0–10.5)
nRBC: 0 % (ref 0.0–0.2)

## 2022-12-25 LAB — GLUCOSE, CAPILLARY
Glucose-Capillary: 82 mg/dL (ref 70–99)
Glucose-Capillary: 82 mg/dL (ref 70–99)
Glucose-Capillary: 60 mg/dL — ABNORMAL LOW (ref 70–99)
Glucose-Capillary: 87 mg/dL (ref 70–99)
Glucose-Capillary: 118 mg/dL — ABNORMAL HIGH (ref 70–99)

## 2022-12-25 SURGERY — LEFT HEART CATH AND CORONARY ANGIOGRAPHY
Anesthesia: LOCAL

## 2022-12-25 MED ORDER — LABETALOL HCL 5 MG/ML IV SOLN: 10.0000 mg | INTRAVENOUS | Status: AC | PRN

## 2022-12-25 MED ORDER — HYDRALAZINE HCL 20 MG/ML IJ SOLN: 10.0000 mg | INTRAMUSCULAR | Status: AC | PRN

## 2022-12-25 MED ORDER — VERAPAMIL HCL 2.5 MG/ML IV SOLN
INTRAVENOUS | Status: DC | PRN
  Administered 2022-12-25: 10 mL via INTRA_ARTERIAL

## 2022-12-25 MED ORDER — HEPARIN (PORCINE) IN NACL 1000-0.9 UT/500ML-% IV SOLN
INTRAVENOUS | Status: DC | PRN
  Administered 2022-12-25: 1000 mL via INTRA_ARTERIAL

## 2022-12-25 MED ORDER — IOHEXOL 350 MG/ML SOLN
INTRAVENOUS | Status: DC | PRN
  Administered 2022-12-25: 24 mL via INTRA_ARTERIAL

## 2022-12-25 MED ORDER — FENTANYL CITRATE (PF) 100 MCG/2ML IJ SOLN
INTRAMUSCULAR | Status: AC
  Filled 2022-12-25: qty 2

## 2022-12-25 MED ORDER — ENOXAPARIN SODIUM 40 MG/0.4ML IJ SOSY
40.0000 mg | PREFILLED_SYRINGE | Freq: Every day | INTRAMUSCULAR | Status: DC
  Administered 2022-12-26 – 2022-12-28 (×3): 40 mg via SUBCUTANEOUS
  Filled 2022-12-25 (×3): qty 0.4

## 2022-12-25 MED ORDER — SODIUM CHLORIDE 0.9 % IV SOLN
250.0000 mL | INTRAVENOUS | Status: DC | PRN
  Administered 2022-12-25: 250 mL via INTRAVENOUS

## 2022-12-25 MED ORDER — VERAPAMIL HCL 2.5 MG/ML IV SOLN
INTRAVENOUS | Status: AC
  Filled 2022-12-25: qty 2

## 2022-12-25 MED ORDER — MIDAZOLAM HCL 2 MG/2ML IJ SOLN
INTRAMUSCULAR | Status: DC | PRN
  Administered 2022-12-25: 1 mg via INTRAVENOUS

## 2022-12-25 MED ORDER — SODIUM CHLORIDE 0.9% FLUSH
3.0000 mL | Freq: Two times a day (BID) | INTRAVENOUS | Status: DC
  Administered 2022-12-27 – 2023-01-02 (×10): 3 mL via INTRAVENOUS

## 2022-12-25 MED ORDER — HEPARIN SODIUM (PORCINE) 1000 UNIT/ML IJ SOLN
INTRAMUSCULAR | Status: AC
  Filled 2022-12-25: qty 10

## 2022-12-25 MED ORDER — INSULIN GLARGINE-YFGN 100 UNIT/ML ~~LOC~~ SOLN
8.0000 [IU] | Freq: Every day | SUBCUTANEOUS | Status: DC
  Administered 2022-12-26: 8 [IU] via SUBCUTANEOUS
  Filled 2022-12-25 (×2): qty 0.08

## 2022-12-25 MED ORDER — SODIUM CHLORIDE 0.9 % IV SOLN: INTRAVENOUS | Status: DC

## 2022-12-25 MED ORDER — SODIUM CHLORIDE 0.9% FLUSH: 3.0000 mL | INTRAVENOUS | Status: DC | PRN

## 2022-12-25 MED ORDER — LIDOCAINE HCL (PF) 1 % IJ SOLN
INTRAMUSCULAR | Status: AC
  Filled 2022-12-25: qty 30

## 2022-12-25 MED ORDER — HEPARIN SODIUM (PORCINE) 1000 UNIT/ML IJ SOLN
INTRAMUSCULAR | Status: DC | PRN
  Administered 2022-12-25: 5500 [IU] via INTRAVENOUS

## 2022-12-25 MED ORDER — FENTANYL CITRATE (PF) 100 MCG/2ML IJ SOLN
INTRAMUSCULAR | Status: DC | PRN
  Administered 2022-12-25: 25 ug via INTRAVENOUS

## 2022-12-25 MED ORDER — MIDAZOLAM HCL 2 MG/2ML IJ SOLN
INTRAMUSCULAR | Status: AC
  Filled 2022-12-25: qty 2

## 2022-12-25 MED ORDER — LIDOCAINE HCL (PF) 1 % IJ SOLN
INTRAMUSCULAR | Status: DC | PRN
  Administered 2022-12-25: 2 mL

## 2022-12-25 SURGICAL SUPPLY — 9 items
CATH 5FR JL3.5 JR4 ANG PIG MP (CATHETERS) IMPLANT
DEVICE RAD COMP TR BAND LRG (VASCULAR PRODUCTS) IMPLANT
GLIDESHEATH SLEND SS 6F .021 (SHEATH) IMPLANT
GUIDEWIRE INQWIRE 1.5J.035X260 (WIRE) IMPLANT
INQWIRE 1.5J .035X260CM (WIRE) ×1
PACK CARDIAC CATHETERIZATION (CUSTOM PROCEDURE TRAY) ×1 IMPLANT
PROTECTION STATION PRESSURIZED (MISCELLANEOUS) ×1
SET ATX-X65L (MISCELLANEOUS) IMPLANT
STATION PROTECTION PRESSURIZED (MISCELLANEOUS) IMPLANT

## 2022-12-25 NOTE — Progress Notes (Signed)
Patient Name: Jeffrey [name] Date of Encounter: 12/25/2022  Primary Cardiologist: None Electrophysiologist: None  Interval Summary   The patient denies acute complaints. Asks about duration of procedure time.    At this time, the patient denies chest pain, shortness of breath, or any new concerns.  Vital Signs    Vitals:   12/24/22 1658 12/24/22 2015 12/25/22 0324 12/25/22 0345  BP: 104/66 104/63 130/89   Pulse: (!) 58 60 62   Resp: 16 18 16    Temp: 98.2 F (36.8 C) 97.6 F (36.4 C) 97.9 F (36.6 C)   TempSrc: Oral Oral Oral   SpO2: 93% 93% 97%   Weight:    107.9 kg  Height:       No intake or output data in the 24 hours ending 12/25/22 0854 Filed Weights   12/18/22 1305 12/24/22 0341 12/25/22 0345  Weight: 111.5 kg 109.4 kg 107.9 kg    Physical Exam    GEN- The patient is well appearing, alert and oriented x 3 today.   Lungs- Clear to ausculation bilaterally, normal work of breathing Cardiac- Regular rate and rhythm, no murmurs, rubs or gallops GI- soft, NT, ND, + BS Extremities- no clubbing or cyanosis. No edema  Telemetry    AP 60, few PVC (personally reviewed)  Radiology/Studies: CT Chest/ABD/Pelvis 9/26 > no acute findings  CT R Knee 9/30 > questionable myositis  TEE 10/1 > LVEF 60-65%, small strand coming from the RA lead > fibrinous material vs vegetation     DEVICE HISTORY:  [employer] Dual Chamber > initial implant 1993 at [place], generator change 09/25/2004, generator change 2019. Atrial lead MDT 5524, RV lead MDT 4058   MICRO: BCx2 9/26 > MSSA  BCx2 9/27 > negative  Hospital Course    Jeffrey [name] is a 57 y.o. male with a history of long QT s/p PPM at [facility] > 7yrs ago, opioid dependence, PTSD, substance abuse, poorly controlled DM with right foot ulceration (followed by Podiatry) who was admitted 9/26 with reports of fatigue, nausea, vomiting, SOB. He was found to have septic shock requiring vasopressors, AKI, & thrombocytopenia. CT  chest/abd/pelvis was negative for acute findings. R knee CT was with questionable myositis. Blood cultures grew MSSA. The patient underwent TEE 10/1 which showed fibrinous material on RA lead. ID following for sepsis / CIED infection.   Assessment & Plan    CIED  MSSA Bacteremia  TEE with fibrinous material on right atrial lead. 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 Leads >25 years old -tentatively planned for hybrid OR/EP procedure for device extraction & epicardial dual chamber system implant on Monday 10/7 with Dr.'s Laneta Simmers and Lalla Brothers  -continue cefazolin  -LHC pending  -appreciate ID assistance with patient care   Long QT Syndrome  S/p dual PPM in 1993, Known loop in RV lead, unchanged in last 10 years.  -avoid QT prolonging agents as able  -follow intermittent EKG   HTN -atenolol, norvasc    Chronic Opioid / Benzo Dependence  -per primary, certainly may impact his post-operative course    Diabetic Ulceration of R Foot R Knee Pain  -per ID    Poorly Controlled DM II  Hgb A1c 9.5 09/2022 -per primary   Thrombocytopenia  Likely in setting of sepsis  -per primary  -on enoxaparin       For questions or updates, please contact [facility] Please consult [email or web address] for contact info under Cardiology/STEMI.  Signed, Canary Brim, MSN, APRN, NP-C, AGACNP-BC [facility] -  Electrophysiology  12/25/2022, 8:54 AM

## 2022-12-25 NOTE — Interval H&P Note (Signed)
History and Physical Interval Note:  12/25/2022 9:34 AM  Jeffrey [name]  has presented today for surgery, with the diagnosis of chest pain.  The various methods of treatment have been discussed with the patient and family. After consideration of risks, benefits and other options for treatment, the patient has consented to  Procedure(s): LEFT HEART CATH AND CORONARY ANGIOGRAPHY (N/A) as a surgical intervention.  The patient's history has been reviewed, patient examined, no change in status, stable for surgery.  I have reviewed the patient's chart and labs.  Questions were answered to the patient's satisfaction.    Cath Lab Visit (complete for each Cath Lab visit)  Clinical Evaluation Leading to the Procedure:   ACS: No.  Non-ACS:    Anginal Classification: No Symptoms  Anti-ischemic medical therapy: No Therapy  Non-Invasive Test Results: No non-invasive testing performed  Prior CABG: No previous CABG        Verne Carrow

## 2022-12-25 NOTE — Progress Notes (Signed)
[facility]  Date of Admission:  12/18/2022      Total days of antibiotics 7   Cefazolin 9/26 >> c          ASSESSMENT: Jeffrey [name] is a 57 y.o. 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     PPM Lead Vegetation -  -plan for extraction 10/7 tentatively, will need more complicated approach given this system originally placed a long time ago.  -intermittently a-pacing on tele but frequently overrides -LHC earlier today done, pending   Right Knee - -Rt knee assessed by ortho team and no concern for septic arthritis, CT scan also unrevealing aside from some swelling in surrounding musculature.  -May be related to fall shortly PTA? -Continue to follow - if stalls in improvement or worsens would request IR to use fluro to guide tap as recommended by ortho .    Right Foot Wound Plantar Wound -  -wounds on Rt foot he has been working with Dr. Ardelle Anton with outpatient which he describe to wax and wane with regards to infection. Seems that they have all healed over. On exam he has hemorrhagic blister on the plantar aspect of metartarsal head of  #1/2. Non painful.   H/O Long QTc    PLAN: Continue cefazolin  OR plans per EP/TCTS  Watch Rt knee    Principal Problem:   Sepsis (HCC) Active Problems:   Shock (HCC)   Hypovolemia   Bacteremia   Pacemaker infection (HCC)   Acute pain of right knee    atenolol  50 mg Oral Daily   Chlorhexidine Gluconate Cloth  6 each Topical Daily   citalopram  40 mg Oral Daily   clonazePAM  1 mg Oral BID   colchicine  0.6 mg Oral Daily   [START ON 12/26/2022] enoxaparin (LOVENOX) injection  40 mg  Subcutaneous Daily   insulin aspart  0-15 Units Subcutaneous TID WC   insulin aspart  0-5 Units Subcutaneous QHS   [START ON 12/26/2022] insulin glargine-yfgn  8 Units Subcutaneous Daily   methadone  150 mg Oral Daily   sodium chloride flush  10-40 mL Intracatheter Q12H   sodium chloride flush  3 mL Intravenous Q12H    SUBJECTIVE: Right knee is maybe getting better slowly   Review of Systems: Review of Systems  Constitutional:  Negative for chills and fever.  Respiratory:  Negative for cough, sputum production and shortness of breath.   Cardiovascular:  Negative for chest pain.  Genitourinary: Negative.   Musculoskeletal:  Positive for joint pain (improving Rt knee).  Neurological: Negative.     Allergies  Allergen Reactions   Codeine Itching   Penicillins Hives    Has patient had a PCN reaction causing immediate rash, facial/tongue/throat swelling, SOB or lightheadedness with hypotension: unknown Has patient had a PCN reaction causing severe rash involving mucus membranes or skin necrosis: unknown Has patient had a PCN reaction that required hospitalization unknown Has patient had a PCN reaction occurring within the last 10 years: No If all of the above answers are "NO", then may proceed with Cephalosporin use.     OBJECTIVE: Vitals:   12/25/22 1000  12/25/22 1005 12/25/22 1026 12/25/22 1049  BP: 122/66 113/69 111/67 108/69  Pulse: 66 65  61  Resp: 17 18    Temp:      TempSrc:      SpO2: 99% 99%  94%  Weight:      Height:       Body mass index is 31.37 kg/m.  Physical Exam Vitals reviewed.  Constitutional:      Appearance: He is well-developed.     Comments: Seated comfortably in chair during visit.   HENT:     Mouth/Throat:     Dentition: Normal dentition. No dental abscesses.  Neck:     Comments: Rt neck CVC clean/dry  Cardiovascular:     Rate and Rhythm: Normal rate and regular rhythm.     Heart sounds: Normal heart sounds.  Pulmonary:     Effort:  Pulmonary effort is normal.     Breath sounds: Normal breath sounds.  Abdominal:     General: There is no distension.     Palpations: Abdomen is soft.     Tenderness: There is no abdominal tenderness.  Musculoskeletal:        General: No swelling. Tenderness: sore Rt knee. Lymphadenopathy:     Cervical: No cervical adenopathy.  Skin:    General: Skin is warm and dry.     Findings: No rash.  Neurological:     Mental Status: He is alert and oriented to person, place, and time.  Psychiatric:        Judgment: Judgment normal.     Lab Results Lab Results  Component Value Date   WBC 12.5 (H) 12/25/2022   HGB 11.9 (L) 12/25/2022   HCT 37.4 (L) 12/25/2022   MCV 89.9 12/25/2022   PLT 166 12/25/2022    Lab Results  Component Value Date   CREATININE 1.09 12/25/2022   BUN 12 12/25/2022   NA 135 12/25/2022   K 3.6 12/25/2022   CL 102 12/25/2022   CO2 23 12/25/2022    Lab Results  Component Value Date   ALT 13 12/25/2022   AST 46 (H) 12/25/2022   ALKPHOS 104 12/25/2022   BILITOT 1.0 12/25/2022     Microbiology: Recent Results (from the past 240 hour(s))  Resp panel by RT-PCR (RSV, Flu A&B, Covid) Anterior Nasal Swab     Status: None   Collection Time: 12/18/22  2:39 AM   Specimen: Anterior Nasal Swab  Result Value Ref Range Status   SARS Coronavirus 2 by RT PCR NEGATIVE NEGATIVE Final    Comment: (NOTE) SARS-CoV-2 target nucleic acids are NOT DETECTED.  The SARS-CoV-2 RNA is generally detectable in upper respiratory specimens during the acute phase of infection. The lowest concentration of SARS-CoV-2 viral copies this assay can detect is 138 copies/mL. A negative result does not preclude SARS-Cov-2 infection and should not be used as the sole basis for treatment or other patient management decisions. A negative result may occur with  improper specimen collection/handling, submission of specimen other than nasopharyngeal swab, presence of viral mutation(s) within  the areas targeted by this assay, and inadequate number of viral copies(<138 copies/mL). A negative result must be combined with clinical observations, patient history, and epidemiological information. The expected result is Negative.  Fact Sheet for Patients:  BloggerCourse.com  Fact Sheet for Healthcare Providers:  SeriousBroker.it  This test is no t yet approved or cleared by the [place] FDA and  has been authorized for detection and/or diagnosis of SARS-CoV-2 by FDA under  an Emergency Use Authorization (EUA). This EUA will remain  in effect (meaning this test can be used) for the duration of the COVID-19 declaration under Section 564(b)(1) of the Act, 21 U.S.C.section 360bbb-3(b)(1), unless the authorization is terminated  or revoked sooner.       Influenza A by PCR NEGATIVE NEGATIVE Final   Influenza B by PCR NEGATIVE NEGATIVE Final    Comment: (NOTE) The Xpert Xpress SARS-CoV-2/FLU/RSV plus assay is intended as an aid in the diagnosis of influenza from Nasopharyngeal swab specimens and should not be used as a sole basis for treatment. Nasal washings and aspirates are unacceptable for Xpert Xpress SARS-CoV-2/FLU/RSV testing.  Fact Sheet for Patients: BloggerCourse.com  Fact Sheet for Healthcare Providers: SeriousBroker.it  This test is not yet approved or cleared by the [place] FDA and has been authorized for detection and/or diagnosis of SARS-CoV-2 by FDA under an Emergency Use Authorization (EUA). This EUA will remain in effect (meaning this test can be used) for the duration of the COVID-19 declaration under Section 564(b)(1) of the Act, 21 U.S.C. section 360bbb-3(b)(1), unless the authorization is terminated or revoked.     Resp Syncytial Virus by PCR NEGATIVE NEGATIVE Final    Comment: (NOTE) Fact Sheet for  Patients: BloggerCourse.com  Fact Sheet for Healthcare Providers: SeriousBroker.it  This test is not yet approved or cleared by the [place] FDA and has been authorized for detection and/or diagnosis of SARS-CoV-2 by FDA under an Emergency Use Authorization (EUA). This EUA will remain in effect (meaning this test can be used) for the duration of the COVID-19 declaration under Section 564(b)(1) of the Act, 21 U.S.C. section 360bbb-3(b)(1), unless the authorization is terminated or revoked.  Performed at [facility], [address] [place], [place] [address]   Culture, blood (Routine x 2)     Status: Abnormal   Collection Time: 12/18/22  2:41 AM   Specimen: BLOOD RIGHT FOREARM  Result Value Ref Range Status   Specimen Description   Final    BLOOD RIGHT FOREARM Performed at [facility], [address], [place], [place] [address]    Special Requests   Final    BOTTLES DRAWN AEROBIC AND ANAEROBIC Blood Culture results may not be optimal due to an excessive volume of blood received in culture bottles Performed at [facility], [address] [place], [place] [address]    Culture  Setup Time   Final    GRAM POSITIVE COCCI IN CLUSTERS IN BOTH AEROBIC AND ANAEROBIC BOTTLES CRITICAL VALUE NOTED.  VALUE IS CONSISTENT WITH PREVIOUSLY REPORTED AND CALLED VALUE.    Culture (A)  Final    STAPHYLOCOCCUS AUREUS SUSCEPTIBILITIES PERFORMED ON PREVIOUS CULTURE WITHIN THE LAST 5 DAYS. Performed at [facility], [address], [place], [place] [address]    Report Status 12/20/2022 FINAL  Final  Culture, blood (Routine x 2)     Status: Abnormal   Collection Time: 12/18/22  2:49 AM   Specimen: BLOOD LEFT FOREARM  Result Value Ref Range Status   Specimen Description   Final    BLOOD LEFT FOREARM Performed at [facility], [address], [place], [place] [address]    Special  Requests   Final    BOTTLES DRAWN AEROBIC AND ANAEROBIC Blood Culture results may not be optimal due to an excessive volume of blood received in culture bottles Performed at [facility], [address], [place], [place] [address]  Culture  Setup Time   Final    GRAM POSITIVE COCCI IN CLUSTERS IN BOTH AEROBIC AND ANAEROBIC BOTTLES CRITICAL RESULT CALLED TO, READ BACK BY AND VERIFIED WITH: PHARMD C. Clearance Coots [id number] @1714  FH Performed at [facility], [address], [place], [place] [address]    Culture STAPHYLOCOCCUS AUREUS (A)  Final   Report Status 12/20/2022 FINAL  Final   Organism ID, Bacteria STAPHYLOCOCCUS AUREUS  Final      Susceptibility   Staphylococcus aureus - MIC*    CIPROFLOXACIN >=8 RESISTANT Resistant     ERYTHROMYCIN >=8 RESISTANT Resistant     GENTAMICIN <=0.5 SENSITIVE Sensitive     OXACILLIN 0.5 SENSITIVE Sensitive     TETRACYCLINE <=1 SENSITIVE Sensitive     VANCOMYCIN <=0.5 SENSITIVE Sensitive     TRIMETH/SULFA >=320 RESISTANT Resistant     CLINDAMYCIN <=0.25 SENSITIVE Sensitive     RIFAMPIN <=0.5 SENSITIVE Sensitive     Inducible Clindamycin NEGATIVE Sensitive     LINEZOLID 2 SENSITIVE Sensitive     * STAPHYLOCOCCUS AUREUS  Blood Culture ID Panel (Reflexed)     Status: Abnormal   Collection Time: 12/18/22  2:49 AM  Result Value Ref Range Status   Enterococcus faecalis NOT DETECTED NOT DETECTED Final   Enterococcus Faecium NOT DETECTED NOT DETECTED Final   Listeria monocytogenes NOT DETECTED NOT DETECTED Final   Staphylococcus species DETECTED (A) NOT DETECTED Final    Comment: CRITICAL RESULT CALLED TO, READ BACK BY AND VERIFIED WITH: PHARMD C. SHADE [id number] @1714  FH    Staphylococcus aureus (BCID) DETECTED (A) NOT DETECTED Final    Comment: CRITICAL RESULT CALLED TO, READ BACK BY AND VERIFIED WITH: PHARMD C. SHADE [id number] @1714  FH    Staphylococcus epidermidis NOT DETECTED NOT DETECTED Final   Staphylococcus lugdunensis NOT  DETECTED NOT DETECTED Final   Streptococcus species NOT DETECTED NOT DETECTED Final   Streptococcus agalactiae NOT DETECTED NOT DETECTED Final   Streptococcus pneumoniae NOT DETECTED NOT DETECTED Final   Streptococcus pyogenes NOT DETECTED NOT DETECTED Final   A.calcoaceticus-baumannii NOT DETECTED NOT DETECTED Final   Bacteroides fragilis NOT DETECTED NOT DETECTED Final   Enterobacterales NOT DETECTED NOT DETECTED Final   Enterobacter cloacae complex NOT DETECTED NOT DETECTED Final   Escherichia coli NOT DETECTED NOT DETECTED Final   Klebsiella aerogenes NOT DETECTED NOT DETECTED Final   Klebsiella oxytoca NOT DETECTED NOT DETECTED Final   Klebsiella pneumoniae NOT DETECTED NOT DETECTED Final   Proteus species NOT DETECTED NOT DETECTED Final   Salmonella species NOT DETECTED NOT DETECTED Final   Serratia marcescens NOT DETECTED NOT DETECTED Final   Haemophilus influenzae NOT DETECTED NOT DETECTED Final   Neisseria meningitidis NOT DETECTED NOT DETECTED Final   Pseudomonas aeruginosa NOT DETECTED NOT DETECTED Final   Stenotrophomonas maltophilia NOT DETECTED NOT DETECTED Final   Candida albicans NOT DETECTED NOT DETECTED Final   Candida auris NOT DETECTED NOT DETECTED Final   Candida glabrata NOT DETECTED NOT DETECTED Final   Candida krusei NOT DETECTED NOT DETECTED Final   Candida parapsilosis NOT DETECTED NOT DETECTED Final   Candida tropicalis NOT DETECTED NOT DETECTED Final   Cryptococcus neoformans/gattii NOT DETECTED NOT DETECTED Final   Meth resistant mecA/C and MREJ NOT DETECTED NOT DETECTED Final    Comment: Performed at [facility], [address], [place], [place] [address]  MRSA Next Gen by PCR, Nasal     Status: None   Collection Time: 12/18/22  1:01 PM  Specimen: Nasal Mucosa; Nasal Swab  Result Value Ref Range Status   MRSA by PCR Next Gen NOT DETECTED NOT DETECTED Final    Comment: (NOTE) The GeneXpert MRSA Assay (FDA approved for NASAL specimens only), is  one component of a comprehensive MRSA colonization surveillance program. It is not intended to diagnose MRSA infection nor to guide or monitor treatment for MRSA infections. Test performance is not FDA approved in patients less than 5 years old. Performed at [facility], [address], [place], [place] [address]   Culture, blood (Routine X 2) w Reflex to ID Panel     Status: None   Collection Time: 12/19/22  3:33 AM   Specimen: BLOOD RIGHT HAND  Result Value Ref Range Status   Specimen Description   Final    BLOOD RIGHT HAND Performed at [facility], [address], [place], [place] [address]    Special Requests   Final    BOTTLES DRAWN AEROBIC AND ANAEROBIC Blood Culture adequate volume Performed at [facility], [address] [place], [place] [address]    Culture   Final    NO GROWTH 5 DAYS Performed at [facility], [address] [place], [place] [address]    Report Status 12/24/2022 FINAL  Final  Culture, blood (Routine X 2) w Reflex to ID Panel     Status: None   Collection Time: 12/19/22  3:33 AM   Specimen: BLOOD RIGHT FOREARM  Result Value Ref Range Status   Specimen Description   Final    BLOOD RIGHT FOREARM Performed at [facility], [address] [place], [place] [address]    Special Requests   Final    BOTTLES DRAWN AEROBIC AND ANAEROBIC Blood Culture adequate volume Performed at [facility], [address] [place], [place] [address]    Culture   Final    NO GROWTH 5 DAYS Performed at [facility], [address] [place], [place] [address]    Report Status 12/24/2022 FINAL  Final     Rexene Alberts, MSN, NP-C [facility]  [email or web address] Pager: [phone] Office: [phone] [facility] Main Line: [phone] *Secure Chat Communication Welcome

## 2022-12-25 NOTE — Plan of Care (Signed)
  Problem: Education: Goal: Knowledge of General Education information will improve Description: Including pain rating scale, medication(s)/side effects and non-pharmacologic comfort measures 12/25/2022 1900 by Shon Indelicato, Caren Griffins, RN Outcome: Progressing 12/25/2022 1900 by Sevannah Madia, Caren Griffins, RN Outcome: Progressing

## 2022-12-25 NOTE — Consult Note (Signed)
Reason for Consult:Right knee pain Referring Physician: Marlin Canary Time called: [id number] Time at bedside: 1117   Jeffrey [name] is an 57 y.o. male.  HPI: Jeffrey [name] was admitted a week ago with bacteremia. He has been having concommitant right knee pain. He says the knee pain started about 5d ago and may have been related to a fall (I suspect his recollection of time may be off as he's been here 7d). At first he could not bear weight but it's been getting steadily better and he's able to bear weight with a RW with minimal pain. He denies prior hx/o similar or of gout.  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, Identity [address], serial # [id number]. [employer] rep must physically interrogate pacemaker.    PTSD (post-traumatic stress disorder) 12/19/2014   On Klonopin, Celexa  Potential benefits of a long term benzodiazepines  use as well as potential risks  and complications were explained to the patient and were aknowledged.    Skin abscess 03/09/2015   11/16 - small    Substance abuse (HCC)    opioids   Tremor 07/17/2016   ?etiology     Past Surgical History:  Procedure Laterality Date   HERNIA REPAIR Right    LIVER SURGERY     PPM GENERATOR CHANGEOUT N/A 08/14/2017   Procedure: PPM GENERATOR CHANGEOUT;  Surgeon: Duke Salvia, MD;  Location: [facility] INVASIVE CV LAB;  Service: Cardiovascular;  Laterality: N/A;   TEE WITHOUT CARDIOVERSION N/A 12/23/2022   Procedure: TRANSESOPHAGEAL ECHOCARDIOGRAM;  Surgeon: [name], MD;  Location: [facility] INVASIVE CV LAB;  Service: Cardiovascular;  Laterality: N/A;    Family History  Problem Relation Age of  Onset   Depression Mother    Diabetes Mother    Arthritis Mother    Arthritis Father     Social History:  reports that he has never smoked. His smokeless tobacco use includes chew. He reports that he does not drink alcohol and does not use drugs.  Allergies:  Allergies  Allergen Reactions   Codeine Itching   Penicillins Hives    Has patient had a PCN reaction causing immediate rash, facial/tongue/throat swelling, SOB or lightheadedness with hypotension: unknown Has patient had a PCN reaction causing severe rash involving mucus membranes or skin necrosis: unknown Has patient had a PCN reaction that required hospitalization unknown Has patient had a PCN reaction occurring within the last 10 years: No If all of the above answers are "NO", then may proceed with Cephalosporin use.     Medications: I have reviewed the patient's current medications.  Results for orders placed or performed during the hospital encounter of 12/18/22 (from the past 48 hour(s))  Glucose, capillary     Status: Abnormal   Collection Time: 12/23/22 11:42 AM  Result Value Ref Range   Glucose-Capillary 112 (H) 70 - 99 mg/dL    Comment: Glucose reference range applies only to samples taken after fasting for at least 8 hours.   Comment 1 Notify RN    Comment 2 Document  in Chart   Glucose, capillary     Status: None   Collection Time: 12/23/22  4:12 PM  Result Value Ref Range   Glucose-Capillary 87 70 - 99 mg/dL    Comment: Glucose reference range applies only to samples taken after fasting for at least 8 hours.  Glucose, capillary     Status: Abnormal   Collection Time: 12/23/22  9:27 PM  Result Value Ref Range   Glucose-Capillary 113 (H) 70 - 99 mg/dL    Comment: Glucose reference range applies only to samples taken after fasting for at least 8 hours.  CBC     Status: Abnormal   Collection Time: 12/24/22  3:46 AM  Result Value Ref Range   WBC 8.0 4.0 - 10.5 K/uL   RBC 3.71 (L) 4.22 - 5.81 MIL/uL    Hemoglobin 10.5 (L) 13.0 - 17.0 g/dL   HCT 60.4 (L) 54.0 - 98.1 %   MCV 88.4 80.0 - 100.0 fL   MCH 28.3 26.0 - 34.0 pg   MCHC 32.0 30.0 - 36.0 g/dL   RDW 19.1 47.8 - 29.5 %   Platelets 114 (L) 150 - 400 K/uL    Comment: REPEATED TO VERIFY   nRBC 0.0 0.0 - 0.2 %    Comment: Performed at [facility], [address], [place], [place] [address]  Comprehensive metabolic panel     Status: Abnormal   Collection Time: 12/24/22  3:46 AM  Result Value Ref Range   Sodium 137 135 - 145 mmol/L   Potassium 3.3 (L) 3.5 - 5.1 mmol/L   Chloride 104 98 - 111 mmol/L   CO2 26 22 - 32 mmol/L   Glucose, Bld 83 70 - 99 mg/dL    Comment: Glucose reference range applies only to samples taken after fasting for at least 8 hours.   BUN 9 6 - 20 mg/dL   Creatinine, Ser 8.65 0.61 - 1.24 mg/dL   Calcium 7.5 (L) 8.9 - 10.3 mg/dL   Total Protein 5.7 (L) 6.5 - 8.1 g/dL   Albumin 2.1 (L) 3.5 - 5.0 g/dL   AST 43 (H) 15 - 41 U/L   ALT 14 0 - 44 U/L   Alkaline Phosphatase 93 38 - 126 U/L   Total Bilirubin 1.2 0.3 - 1.2 mg/dL   GFR, Estimated >78 >46 mL/min    Comment: (NOTE) Calculated using the CKD-EPI Creatinine Equation (2021)    Anion gap 7 5 - 15    Comment: Performed at [facility], [address], [place], [place] [address]  Glucose, capillary     Status: None   Collection Time: 12/24/22  7:52 AM  Result Value Ref Range   Glucose-Capillary 90 70 - 99 mg/dL    Comment: Glucose reference range applies only to samples taken after fasting for at least 8 hours.  Glucose, capillary     Status: Abnormal   Collection Time: 12/24/22 12:24 PM  Result Value Ref Range   Glucose-Capillary 129 (H) 70 - 99 mg/dL    Comment: Glucose reference range applies only to samples taken after fasting for at least 8 hours.  Glucose, capillary     Status: Abnormal   Collection Time: 12/24/22  4:56 PM  Result Value Ref Range   Glucose-Capillary 112 (H) 70 - 99 mg/dL    Comment: Glucose reference range applies  only to samples taken after fasting for at least 8 hours.  Glucose, capillary     Status: None  Collection Time: 12/24/22  8:46 PM  Result Value Ref Range   Glucose-Capillary 95 70 - 99 mg/dL    Comment: Glucose reference range applies only to samples taken after fasting for at least 8 hours.  CBC     Status: Abnormal   Collection Time: 12/25/22  3:52 AM  Result Value Ref Range   WBC 12.5 (H) 4.0 - 10.5 K/uL   RBC 4.16 (L) 4.22 - 5.81 MIL/uL   Hemoglobin 11.9 (L) 13.0 - 17.0 g/dL   HCT 38.7 (L) 56.4 - 33.2 %   MCV 89.9 80.0 - 100.0 fL   MCH 28.6 26.0 - 34.0 pg   MCHC 31.8 30.0 - 36.0 g/dL   RDW 95.1 88.4 - 16.6 %   Platelets 166 150 - 400 K/uL   nRBC 0.0 0.0 - 0.2 %    Comment: Performed at [facility], [address] [place], [place] [address]  Comprehensive metabolic panel     Status: Abnormal   Collection Time: 12/25/22  3:52 AM  Result Value Ref Range   Sodium 135 135 - 145 mmol/L   Potassium 3.6 3.5 - 5.1 mmol/L   Chloride 102 98 - 111 mmol/L   CO2 23 22 - 32 mmol/L   Glucose, Bld 98 70 - 99 mg/dL    Comment: Glucose reference range applies only to samples taken after fasting for at least 8 hours.   BUN 12 6 - 20 mg/dL   Creatinine, Ser 6.01 0.61 - 1.24 mg/dL   Calcium 7.6 (L) 8.9 - 10.3 mg/dL   Total Protein 6.6 6.5 - 8.1 g/dL   Albumin 2.4 (L) 3.5 - 5.0 g/dL   AST 46 (H) 15 - 41 U/L   ALT 13 0 - 44 U/L   Alkaline Phosphatase 104 38 - 126 U/L   Total Bilirubin 1.0 0.3 - 1.2 mg/dL   GFR, Estimated >09 >32 mL/min    Comment: (NOTE) Calculated using the CKD-EPI Creatinine Equation (2021)    Anion gap 10 5 - 15    Comment: Performed at [facility], [address], [place], [place] [address]  Glucose, capillary     Status: None   Collection Time: 12/25/22  7:46 AM  Result Value Ref Range   Glucose-Capillary 82 70 - 99 mg/dL    Comment: Glucose reference range applies only to samples taken after fasting for at least 8 hours.    CARDIAC  CATHETERIZATION  Result Date: 12/25/2022 No angiographic evidence of CAD LVEDP 16 mmHg Recommendations: No further ischemic workup   ECHO TEE  Result Date: 12/23/2022    TRANSESOPHOGEAL ECHO REPORT   Patient Name:   Jeffrey [name] Date of Exam: 12/23/2022 Medical Rec #:  [id number]         Height:       73.0 in Accession #:    [id number]        Weight:       245.8 lb Date of Birth:  10-Dec-1965         BSA:          2.349 m Patient Age:    57 years          BP:           108/71 mmHg Patient Gender: M                 HR:           73 bpm. Exam Location:  Inpatient  Procedure: Transesophageal Echo and Limited Color Doppler Indications:     Bacteremia  History:         Patient has prior history of Echocardiogram examinations, most                  recent 12/19/2022. Pacemaker; Risk Factors:h/o substance abuse.  Sonographer:     Thurman Coyer RDCS Referring Phys:  [id number] [facility] Diagnosing Phys: [place] PROCEDURE: The transesophogeal probe was passed without difficulty through the esophogus of the patient. Sedation performed by different physician. The patient was monitored while under deep sedation. Anesthestetic sedation was provided intravenously by Anesthesiology: 300mg  of Propofol, 100mg  of Lidocaine. The patient developed no complications during the procedure.  IMPRESSIONS  1. Left ventricular ejection fraction, by estimation, is 60 to 65%. The left ventricle has normal function.  2. Right ventricular systolic function is normal. The right ventricular size is normal.  3. No left atrial/left atrial appendage thrombus was detected.  4. The mitral valve is normal in structure. Trivial mitral valve regurgitation.  5. The aortic valve is normal in structure. Aortic valve regurgitation is not visualized.  6. There is mild (Grade II) plaque. Conclusion(s)/Recommendation(s): Small strand coming from the right atrial lead, can be fibrinous materal (slide 33) (if bacteremia is persistent, can consider  repeat study). FINDINGS  Left Ventricle: Left ventricular ejection fraction, by estimation, is 60 to 65%. The left ventricle has normal function. The left ventricular internal cavity size was normal in size. Right Ventricle: The right ventricular size is normal. Right ventricular systolic function is normal. Left Atrium: No left atrial/left atrial appendage thrombus was detected. Pericardium: There is no evidence of pericardial effusion. Mitral Valve: The mitral valve is normal in structure. Trivial mitral valve regurgitation. Tricuspid Valve: The tricuspid valve is normal in structure. Tricuspid valve regurgitation is mild. Aortic Valve: The aortic valve is normal in structure. Aortic valve regurgitation is not visualized. Pulmonic Valve: The pulmonic valve was normal in structure. Pulmonic valve regurgitation is not visualized. Aorta: The aortic root is normal in size and structure. There is mild (Grade II) plaque. IAS/Shunts: No atrial level shunt detected by color flow Doppler. Additional Comments: A device lead is visualized. Carolan Clines Electronically signed by Carolan Clines Signature Date/Time: 12/23/2022/3:42:06 PM    Final    EP STUDY  Result Date: 12/23/2022 See surgical note for result.   Review of Systems  HENT:  Negative for ear discharge, ear pain, hearing loss and tinnitus.   Eyes:  Negative for photophobia and pain.  Respiratory:  Negative for cough and shortness of breath.   Cardiovascular:  Negative for chest pain.  Gastrointestinal:  Negative for abdominal pain, nausea and vomiting.  Genitourinary:  Negative for dysuria, flank pain, frequency and urgency.  Musculoskeletal:  Positive for arthralgias (Right knee). Negative for back pain, myalgias and neck pain.  Neurological:  Negative for dizziness and headaches.  Hematological:  Does not bruise/bleed easily.  Psychiatric/Behavioral:  The patient is not nervous/anxious.    Blood pressure 108/69, pulse 61, temperature 97.9 F (36.6  C), temperature source Oral, resp. rate 18, height 6\' 1"  (1.854 m), weight 107.9 kg, SpO2 94%. Physical Exam Constitutional:      General: He is not in acute distress.    Appearance: He is well-developed. He is not diaphoretic.  HENT:     Head: Normocephalic and atraumatic.  Eyes:     General: No scleral icterus.       Right eye: No discharge.  Left eye: No discharge.     Conjunctiva/sclera: Conjunctivae normal.  Cardiovascular:     Rate and Rhythm: Normal rate and regular rhythm.  Pulmonary:     Effort: Pulmonary effort is normal. No respiratory distress.  Musculoskeletal:     Cervical back: Normal range of motion.     Comments: RLE No traumatic wounds, ecchymosis, or rash  Nontender, painless AROM to 90 degrees, painless PROM to 75 degrees  No knee or ankle effusion  Knee stable to varus/ valgus and anterior/posterior stress  Sens DPN, SPN, TN intact  Motor EHL, ext, flex, evers 5/5  DP 2+, PT 1+, No significant edema  Skin:    General: Skin is warm and dry.  Neurological:     Mental Status: He is alert.  Psychiatric:        Mood and Affect: Mood normal.        Behavior: Behavior normal.     Assessment/Plan: Right knee pain -- Would tend to lend credence to pt's history of acute trauma to the knee causing his pain. No clinical e/o septic arthritis, would not recommend arthrocentesis. If still desired would recommend IR guided aspiration to lessen chance of dry tap. No need for orthopedic f/u as long as he continues to improve.    Freeman Caldron, PA-C Orthopedic Surgery [phone] 12/25/2022, 11:26 AM

## 2022-12-25 NOTE — Consult Note (Signed)
[address]       [name] [address]             [phone]      Cardiothoracic Surgery Consultation  Reason for Consult: Infected permanent pacemaker system. Referring Physician: Steffanie Dunn, MD  Jeffrey [name] is an 57 y.o. male.  HPI:   The patient is a 57 year old gentleman with history of poorly controlled diabetes, remote opioid dependence now on methadone for the past 30 years, PTSD, long QT syndrome, sick sinus syndrome, and syncope who had a permanent dual-chamber pacemaker implanted at [facility] more than 30 years ago.  He has subsequently undergone 2 generator changes.  He was now admitted with fatigue, nausea and vomiting, and shortness of breath was found to be in septic shock requiring vasopressors.  Blood cultures were positive for Staph aureus.  TEE on 12/23/2022 showed no evidence of valve infection but there was a small strand coming from the right atrial lead and unclear significance but suggesting possible CIED infection.  The patient was recently treated for an ulceration on the plantar surface of his right foot which has subsequently closed up and is no longer draining.  He has some pain in his right knee with moderate swelling on admission which has subsequently improved.  He does report a fall prior to admission and thinks he landed on his knees.  Initially could not bear weight but it has been getting better and he is now able to bear weight with rolling walker with minimal pain.  He was seen by orthopedic surgery today and they did not feel it was consistent with septic arthritis.  They did not recommend arthrocentesis.     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, Identity [address], serial # [id number]. [employer] rep must physically interrogate pacemaker.    PTSD (post-traumatic stress disorder) 12/19/2014   On Klonopin, Celexa  Potential benefits of a long term benzodiazepines  use as well as potential risks  and complications were explained to the patient and were aknowledged.    Skin abscess 03/09/2015   11/16 - small    Substance abuse (HCC)    opioids   Tremor 07/17/2016   ?etiology     Past Surgical History:  Procedure Laterality Date   HERNIA REPAIR Right    LIVER SURGERY     PPM GENERATOR CHANGEOUT N/A 08/14/2017   Procedure: PPM GENERATOR CHANGEOUT;  Surgeon: Duke Salvia, MD;  Location: [facility] INVASIVE CV LAB;  Service: Cardiovascular;  Laterality: N/A;   TEE WITHOUT CARDIOVERSION N/A 12/23/2022   Procedure: TRANSESOPHAGEAL ECHOCARDIOGRAM;  Surgeon: [name], MD;  Location: [facility] INVASIVE CV LAB;  Service: Cardiovascular;  Laterality: N/A;    Family History  Problem Relation Age of Onset   Depression Mother    Diabetes Mother    Arthritis Mother    Arthritis Father     Social History:  reports that he has never smoked. His smokeless tobacco use includes chew. He reports that he does not  drink alcohol and does not use drugs.  Allergies:  Allergies  Allergen Reactions   Codeine Itching   Penicillins Hives    Has patient had a PCN reaction causing immediate rash, facial/tongue/throat swelling, SOB or lightheadedness with hypotension: unknown Has patient had a PCN reaction causing severe rash involving mucus membranes or skin necrosis: unknown Has patient had a PCN reaction that required hospitalization unknown Has patient had a PCN reaction occurring within the last 10 years: No If all of the above answers are "NO", then may proceed with Cephalosporin use.     Medications: I have reviewed the patient's current medications. Prior to Admission:   Medications Prior to Admission  Medication Sig Dispense Refill Last Dose   ammonium lactate (AMLACTIN) 12 % cream Apply 1 Application topically as needed for dry skin. (Patient taking differently: Apply 1 Application topically as needed for dry skin (right foot- affected areas).) 385 g 0 Past Week   atenolol (TENORMIN) 50 MG tablet Take 1 tablet (50 mg total) by mouth daily. 30 tablet 11 12/16/2022   citalopram (CELEXA) 40 MG tablet Take 1 tablet (40 mg total) by mouth daily. Must keep 05/21/22 appt for future refills (Patient taking differently: Take 40 mg by mouth daily.) 30 tablet 5 12/16/2022   clonazePAM (KLONOPIN) 1 MG tablet TAKE 1 TABLET BY MOUTH TWICE A DAY (Patient taking differently: Take 1 mg by mouth 2 (two) times daily as needed for anxiety.) 60 tablet 2 Past Week   Continuous Blood Gluc Sensor (FREESTYLE LIBRE 3 SENSOR) MISC 1 Units by Does not apply route every 14 (fourteen) days. (Patient taking differently: Inject 1 Device into the skin every 14 (fourteen) days.) 6 each 3 Past Month   ibuprofen (ADVIL,MOTRIN) 200 MG tablet Take 400 mg by mouth every 6 (six) hours as needed for mild pain, moderate pain or headache.   unk   methadone (DOLOPHINE) 10 MG/ML solution Take 150 mg by mouth in the morning.   12/18/2022 at am   mupirocin ointment (BACTROBAN) 2 % On leg wound w/dressing change qd or bid (Patient taking differently: Apply 1 Application topically See admin instructions. Apply to affected areas of the right foot once a week) 30 g 0 Past Week   silver sulfADIAZINE (SILVADENE) 1 % cream Apply 1 Application topically daily. (Patient taking differently: Apply 1 Application topically See admin instructions. Apply to affected areas of the right foot 2 times a week) 50 g 0 Past Week   Vitamin D, Ergocalciferol, (DRISDOL) 1.25 MG (50000 UNIT) CAPS capsule Take 1 capsule (50,000 Units total) by mouth every 7 (seven) days. (Patient taking differently: Take 50,000 Units by mouth every Monday.) 8  capsule 0 12/15/2022   Cholecalciferol (VITAMIN D3) 2000 UNITS capsule Take 1 capsule (2,000 Units total) by mouth daily. (Patient not taking: Reported on 12/18/2022) 100 capsule 3 Not Taking   metFORMIN (GLUCOPHAGE) 500 MG tablet Take 1 tablet (500 mg total) by mouth 2 (two) times daily with a meal. (Patient not taking: Reported on 12/18/2022) 180 tablet 3 Not Taking   triamcinolone cream (KENALOG) 0.1 % Apply 1 application topically 3 (three) times daily. (Patient not taking: Reported on 12/18/2022) 45 g 1 Not Taking   valACYclovir (VALTREX) 1000 MG tablet Take 1 tablet (1,000 mg total) by mouth 3 (three) times daily. (Patient not taking: Reported on 12/18/2022) 21 tablet 0 Not Taking   Scheduled:  atenolol  50 mg Oral Daily   Chlorhexidine Gluconate Cloth  6 each Topical Daily  citalopram  40 mg Oral Daily   clonazePAM  1 mg Oral BID   colchicine  0.6 mg Oral Daily   [START ON 12/26/2022] enoxaparin (LOVENOX) injection  40 mg Subcutaneous Daily   insulin aspart  0-15 Units Subcutaneous TID WC   insulin aspart  0-5 Units Subcutaneous QHS   [START ON 12/26/2022] insulin glargine-yfgn  8 Units Subcutaneous Daily   methadone  150 mg Oral Daily   sodium chloride flush  10-40 mL Intracatheter Q12H   sodium chloride flush  3 mL Intravenous Q12H   Continuous:  sodium chloride Stopped (12/18/22 1346)   sodium chloride     sodium chloride Stopped (12/19/22 1510)   sodium chloride 250 mL (12/25/22 1701)    ceFAZolin (ANCEF) IV 2 g (12/25/22 1700)   OZD:GUYQI/HKVQQVZD arterial line **AND** sodium chloride, sodium chloride, sodium chloride, acetaminophen **OR** acetaminophen, albuterol, mouth rinse, sodium chloride flush, sodium chloride flush, traZODone Anti-infectives (From admission, onward)    Start     Dose/Rate Route Frequency Ordered Stop   12/19/22 0000  vancomycin (VANCOCIN) IVPB 1000 mg/200 mL premix  Status:  Discontinued        1,000 mg 200 mL/hr over 60 Minutes Intravenous Every 12  hours 12/18/22 0952 12/18/22 1804   12/18/22 2200  ceFAZolin (ANCEF) IVPB 2g/100 mL premix        2 g 200 mL/hr over 30 Minutes Intravenous Every 8 hours 12/18/22 1804     12/18/22 1000  metroNIDAZOLE (FLAGYL) IVPB 500 mg  Status:  Discontinued        500 mg 100 mL/hr over 60 Minutes Intravenous Every 12 hours 12/18/22 0930 12/18/22 0952   12/18/22 1000  vancomycin (VANCOREADY) IVPB 2000 mg/400 mL        2,000 mg 200 mL/hr over 120 Minutes Intravenous  Once 12/18/22 0952 12/18/22 1229   12/18/22 1000  meropenem (MERREM) 1 g in sodium chloride 0.9 % 100 mL IVPB  Status:  Discontinued        1 g 200 mL/hr over 30 Minutes Intravenous Every 8 hours 12/18/22 0952 12/18/22 1804   12/18/22 0445  aztreonam (AZACTAM) 1 g in sodium chloride 0.9 % 100 mL IVPB        1 g 200 mL/hr over 30 Minutes Intravenous  Once 12/18/22 0431 12/18/22 0553       Results for orders placed or performed during the hospital encounter of 12/18/22 (from the past 48 hour(s))  Glucose, capillary     Status: Abnormal   Collection Time: 12/23/22  9:27 PM  Result Value Ref Range   Glucose-Capillary 113 (H) 70 - 99 mg/dL    Comment: Glucose reference range applies only to samples taken after fasting for at least 8 hours.  CBC     Status: Abnormal   Collection Time: 12/24/22  3:46 AM  Result Value Ref Range   WBC 8.0 4.0 - 10.5 K/uL   RBC 3.71 (L) 4.22 - 5.81 MIL/uL   Hemoglobin 10.5 (L) 13.0 - 17.0 g/dL   HCT 63.8 (L) 75.6 - 43.3 %   MCV 88.4 80.0 - 100.0 fL   MCH 28.3 26.0 - 34.0 pg   MCHC 32.0 30.0 - 36.0 g/dL   RDW 29.5 18.8 - 41.6 %   Platelets 114 (L) 150 - 400 K/uL    Comment: REPEATED TO VERIFY   nRBC 0.0 0.0 - 0.2 %    Comment: Performed at [facility], [address] [place], [place] [address]  Comprehensive metabolic panel     Status: Abnormal   Collection Time: 12/24/22  3:46 AM  Result Value Ref Range   Sodium 137 135 - 145 mmol/L   Potassium 3.3 (L) 3.5 - 5.1 mmol/L   Chloride 104 98 -  111 mmol/L   CO2 26 22 - 32 mmol/L   Glucose, Bld 83 70 - 99 mg/dL    Comment: Glucose reference range applies only to samples taken after fasting for at least 8 hours.   BUN 9 6 - 20 mg/dL   Creatinine, Ser 4.09 0.61 - 1.24 mg/dL   Calcium 7.5 (L) 8.9 - 10.3 mg/dL   Total Protein 5.7 (L) 6.5 - 8.1 g/dL   Albumin 2.1 (L) 3.5 - 5.0 g/dL   AST 43 (H) 15 - 41 U/L   ALT 14 0 - 44 U/L   Alkaline Phosphatase 93 38 - 126 U/L   Total Bilirubin 1.2 0.3 - 1.2 mg/dL   GFR, Estimated >81 >19 mL/min    Comment: (NOTE) Calculated using the CKD-EPI Creatinine Equation (2021)    Anion gap 7 5 - 15    Comment: Performed at [facility], [address] [place], [place] [address]  Glucose, capillary     Status: None   Collection Time: 12/24/22  7:52 AM  Result Value Ref Range   Glucose-Capillary 90 70 - 99 mg/dL    Comment: Glucose reference range applies only to samples taken after fasting for at least 8 hours.  Glucose, capillary     Status: Abnormal   Collection Time: 12/24/22 12:24 PM  Result Value Ref Range   Glucose-Capillary 129 (H) 70 - 99 mg/dL    Comment: Glucose reference range applies only to samples taken after fasting for at least 8 hours.  Glucose, capillary     Status: Abnormal   Collection Time: 12/24/22  4:56 PM  Result Value Ref Range   Glucose-Capillary 112 (H) 70 - 99 mg/dL    Comment: Glucose reference range applies only to samples taken after fasting for at least 8 hours.  Glucose, capillary     Status: None   Collection Time: 12/24/22  8:46 PM  Result Value Ref Range   Glucose-Capillary 95 70 - 99 mg/dL    Comment: Glucose reference range applies only to samples taken after fasting for at least 8 hours.  CBC     Status: Abnormal   Collection Time: 12/25/22  3:52 AM  Result Value Ref Range   WBC 12.5 (H) 4.0 - 10.5 K/uL   RBC 4.16 (L) 4.22 - 5.81 MIL/uL   Hemoglobin 11.9 (L) 13.0 - 17.0 g/dL   HCT 95.6 (L) 21.3 - 08.6 %   MCV 89.9 80.0 - 100.0 fL   MCH 28.6  26.0 - 34.0 pg   MCHC 31.8 30.0 - 36.0 g/dL   RDW 57.8 46.9 - 62.9 %   Platelets 166 150 - 400 K/uL   nRBC 0.0 0.0 - 0.2 %    Comment: Performed at [facility], [address] [place], [place] [address]  Comprehensive metabolic panel     Status: Abnormal   Collection Time: 12/25/22  3:52 AM  Result Value Ref Range   Sodium 135 135 - 145 mmol/L   Potassium 3.6 3.5 - 5.1 mmol/L   Chloride 102 98 - 111 mmol/L   CO2 23 22 - 32 mmol/L   Glucose, Bld 98 70 - 99 mg/dL    Comment: Glucose reference range  applies only to samples taken after fasting for at least 8 hours.   BUN 12 6 - 20 mg/dL   Creatinine, Ser 0.10 0.61 - 1.24 mg/dL   Calcium 7.6 (L) 8.9 - 10.3 mg/dL   Total Protein 6.6 6.5 - 8.1 g/dL   Albumin 2.4 (L) 3.5 - 5.0 g/dL   AST 46 (H) 15 - 41 U/L   ALT 13 0 - 44 U/L   Alkaline Phosphatase 104 38 - 126 U/L   Total Bilirubin 1.0 0.3 - 1.2 mg/dL   GFR, Estimated >27 >25 mL/min    Comment: (NOTE) Calculated using the CKD-EPI Creatinine Equation (2021)    Anion gap 10 5 - 15    Comment: Performed at [facility], [address], [place], [place] [address]  Glucose, capillary     Status: None   Collection Time: 12/25/22  7:46 AM  Result Value Ref Range   Glucose-Capillary 82 70 - 99 mg/dL    Comment: Glucose reference range applies only to samples taken after fasting for at least 8 hours.  Glucose, capillary     Status: None   Collection Time: 12/25/22 12:15 PM  Result Value Ref Range   Glucose-Capillary 82 70 - 99 mg/dL    Comment: Glucose reference range applies only to samples taken after fasting for at least 8 hours.  Glucose, capillary     Status: Abnormal   Collection Time: 12/25/22  4:18 PM  Result Value Ref Range   Glucose-Capillary 60 (L) 70 - 99 mg/dL    Comment: Glucose reference range applies only to samples taken after fasting for at least 8 hours.  Glucose, capillary     Status: None   Collection Time: 12/25/22  5:09 PM  Result Value Ref Range    Glucose-Capillary 87 70 - 99 mg/dL    Comment: Glucose reference range applies only to samples taken after fasting for at least 8 hours.    CARDIAC CATHETERIZATION  Result Date: 12/25/2022 No angiographic evidence of CAD LVEDP 16 mmHg Recommendations: No further ischemic workup    Review of Systems  Constitutional:  Negative for appetite change, chills, fever and unexpected weight change.  HENT: Negative.    Respiratory:  Negative for shortness of breath.   Cardiovascular:  Negative for chest pain and leg swelling.  Gastrointestinal: Negative.   Endocrine: Negative.   Genitourinary: Negative.   Musculoskeletal:        Right knee pain that is improving  Skin: Negative.   Allergic/Immunologic: Negative.   Neurological:  Negative for dizziness, syncope and headaches.  Hematological: Negative.   Psychiatric/Behavioral:         History of PTSD and opioid dependence   Blood pressure 112/67, pulse 61, temperature 98.4 F (36.9 C), temperature source Oral, resp. rate 16, height 6\' 1"  (1.854 m), weight 107.9 kg, SpO2 94%. Physical Exam Constitutional:      Appearance: Normal appearance. He is obese.  HENT:     Head: Normocephalic and atraumatic.  Eyes:     Extraocular Movements: Extraocular movements intact.     Conjunctiva/sclera: Conjunctivae normal.     Pupils: Pupils are equal, round, and reactive to light.  Cardiovascular:     Rate and Rhythm: Normal rate and regular rhythm.     Pulses: Normal pulses.     Heart sounds: Normal heart sounds. No murmur heard. Pulmonary:     Effort: Pulmonary effort is normal.     Breath sounds: Normal breath sounds.  Abdominal:  General: Bowel sounds are normal. There is no distension.     Tenderness: There is no abdominal tenderness.  Musculoskeletal:     Comments: There is slight swelling of the right knee with no erythema or warmth.  Skin:    General: Skin is warm and dry.     Comments: Ulcer on the plantar surface of the right foot  is almost healed and dry.  There is no sign of infection.  Neurological:     General: No focal deficit present.     Mental Status: He is alert and oriented to person, place, and time.  Psychiatric:        Mood and Affect: Mood normal.        Behavior: Behavior normal.   Physicians  Panel Physicians Referring Physician Case Authorizing Physician  Kathleene Hazel, MD (Primary)     Procedures  LEFT HEART CATH AND CORONARY ANGIOGRAPHY   Conclusion  No angiographic evidence of CAD LVEDP 16 mmHg   Recommendations: No further ischemic workup   Surgeon Notes    12/23/2022  1:37 PM CV Procedure signed by [name], MD   Indications  Precordial pain [R07.2 (ICD-10-CM)]   Procedural Details  Technical Details Indication: Chest pain. Pending need for surgical removal of pacemaker lead  Procedure: The risks, benefits, complications, treatment options, and expected outcomes were discussed with the patient. The patient and/or family concurred with the proposed plan, giving informed consent. The patient was sedated with Versed and Fentanyl. The right wrist was prepped and draped in a sterile fashion. 1% lidocaine was used for local anesthesia. Using the modified Seldinger access technique, a 5 French sheath was placed in the right radial artery. 3 mg Verapamil was given through the sheath. Weight based IV heparin was given. Standard diagnostic catheters were used to perform selective coronary angiography. LV pressures measured with the JR4 catheter. All catheter exchanges were performed over an exchange length guidewire.   The sheath was removed from the right radial artery and a hemostasis band was applied at the arteriotomy site on the right wrist.      Estimated blood loss <50 mL.   During this procedure medications were administered to achieve and maintain moderate conscious sedation while the patient's heart rate, blood pressure, and oxygen saturation were continuously  monitored and I was present face-to-face 100% of this time.   Medications (Filter: Administrations occurring from [phone] to 1015 on 12/25/22)  important  Continuous medications are totaled by the amount administered until 12/25/22 1015.   fentaNYL (SUBLIMAZE) injection (mcg)  Total dose: 25 mcg Date/Time Rate/Dose/Volume Action   12/25/22 0945 25 mcg Given   midazolam (VERSED) injection (mg)  Total dose: 1 mg Date/Time Rate/Dose/Volume Action   12/25/22 0945 1 mg Given   lidocaine (PF) (XYLOCAINE) 1 % injection (mL)  Total volume: 2 mL Date/Time Rate/Dose/Volume Action   12/25/22 0953 2 mL Given   Radial Cocktail/Verapamil only (mL)  Total volume: 10 mL Date/Time Rate/Dose/Volume Action   12/25/22 0954 10 mL Given   heparin sodium (porcine) injection (Units)  Total dose: 5,500 Units Date/Time Rate/Dose/Volume Action   12/25/22 0954 5,500 Units Given   iohexol (OMNIPAQUE) 350 MG/ML injection (mL)  Total volume: 24 mL Date/Time Rate/Dose/Volume Action   12/25/22 1004 24 mL Given   Heparin (Porcine) in NaCl 1000-0.9 UT/500ML-% SOLN (mL)  Total volume: 1,000 mL Date/Time Rate/Dose/Volume Action   12/25/22 1004 1,000 mL Given   0.9 %  sodium chloride infusion (mL/hr)  Total  dose: Cannot be calculated* Dosing weight: 111.5 *Administration dose not documented Date/Time Rate/Dose/Volume Action   12/25/22 0924 *Not included in total MAR Hold   albuterol (PROVENTIL) (2.5 MG/3ML) 0.083% nebulizer solution 2.5 mg (mg)  Total dose: Cannot be calculated* Dosing weight: 117.9 *Administration dose not documented Date/Time Rate/Dose/Volume Action   12/25/22 0924 *Not included in total MAR Hold   atenolol (TENORMIN) tablet 50 mg (mg)  Total dose: Cannot be calculated* Dosing weight: 111.5 *Administration dose not documented Date/Time Rate/Dose/Volume Action   12/25/22 0924 *Not included in total MAR Hold   1000 *Not included in total Automatically Held   ceFAZolin (ANCEF) IVPB 2g/100 mL  premix (mL/hr)  Total dose: Cannot be calculated* Dosing weight: 111.5 *Administration dose not documented Date/Time Rate/Dose/Volume Action   12/25/22 0924 *Not included in total MAR Hold   1000 *Not included in total Automatically Held   Chlorhexidine Gluconate Cloth 2 % PADS 6 each (each)  Total dose: Cannot be calculated* Dosing weight: 117.9 *Administration dose not documented Date/Time Rate/Dose/Volume Action   12/25/22 0924 *Not included in total MAR Hold   1000 *Not included in total Automatically Held   citalopram (CELEXA) tablet 40 mg (mg)  Total dose: Cannot be calculated* Dosing weight: 111.5 *Administration dose not documented Date/Time Rate/Dose/Volume Action   12/25/22 0924 *Not included in total MAR Hold   1000 *Not included in total Automatically Held   clonazePAM (KLONOPIN) tablet 1 mg (mg)  Total dose: Cannot be calculated* Dosing weight: 111.5 *Administration dose not documented Date/Time Rate/Dose/Volume Action   12/25/22 0924 *Not included in total MAR Hold   colchicine tablet 0.6 mg (mg)  Total dose: Cannot be calculated* Dosing weight: 111.5 *Administration dose not documented Date/Time Rate/Dose/Volume Action   12/25/22 0924 *Not included in total MAR Hold   1000 *Not included in total Automatically Held   insulin aspart (novoLOG) injection 0-15 Units (Units)  Total dose: Cannot be calculated* Dosing weight: 117.9 *Administration dose not documented Date/Time Rate/Dose/Volume Action   12/25/22 0924 *Not included in total MAR Hold   insulin aspart (novoLOG) injection 0-5 Units (Units)  Total dose: Cannot be calculated* Dosing weight: 117.9 *Administration dose not documented Date/Time Rate/Dose/Volume Action   12/25/22 0924 *Not included in total MAR Hold   methadone (DOLOPHINE) 10 MG/ML solution 150 mg (mg)  Total dose: Cannot be calculated* Dosing weight: 111.5 *Administration dose not documented Date/Time Rate/Dose/Volume Action   12/25/22 0924  *Not included in total MAR Hold   Oral care mouth rinse (mL)  Total dose: Cannot be calculated* Dosing weight: 111.5 *Administration dose not documented Date/Time Rate/Dose/Volume Action   12/25/22 0924 *Not included in total MAR Hold   sodium chloride flush (NS) 0.9 % injection 10-40 mL (mL)  Total dose: Cannot be calculated* Dosing weight: 111.5 *Administration dose not documented Date/Time Rate/Dose/Volume Action   12/25/22 0924 *Not included in total MAR Hold   1000 *Not included in total Automatically Held   sodium chloride flush (NS) 0.9 % injection 10-40 mL (mL)  Total dose: Cannot be calculated* Dosing weight: 111.5 *Administration dose not documented Date/Time Rate/Dose/Volume Action   12/25/22 0924 *Not included in total MAR Hold   traZODone (DESYREL) tablet 25 mg (mg)  Total dose: Cannot be calculated* Dosing weight: 117.9 *Administration dose not documented Date/Time Rate/Dose/Volume Action   12/25/22 0924 *Not included in total MAR Hold   amLODipine (NORVASC) tablet 5 mg (mg)  Total dose: Cannot be calculated* Dosing weight: 109.4 *Administration dose not documented Date/Time Rate/Dose/Volume Action  12/25/22 1610 *Not included in total MAR Hold   1000 *Not included in total Automatically Held   enoxaparin (LOVENOX) injection 40 mg (mg)  Total dose: Cannot be calculated* Dosing weight: 117.9 *Administration dose not documented Date/Time Rate/Dose/Volume Action   12/25/22 0924 *Not included in total MAR Hold   1000 *Not included in total Automatically Held   insulin glargine-yfgn (SEMGLEE) injection 11 Units (Units)  Total dose: Cannot be calculated* Dosing weight: 111.5 *Administration dose not documented Date/Time Rate/Dose/Volume Action   12/25/22 0924 *Not included in total MAR Hold   0932 *Not included in total MAR Unhold   metFORMIN (GLUCOPHAGE) tablet 500 mg (mg)  Total dose: Cannot be calculated* Dosing weight: 111.5 *Administration dose not  documented Date/Time Rate/Dose/Volume Action   12/25/22 0924 *Not included in total MAR Hold    Sedation Time  Sedation Time Physician-1: 16 minutes 46 seconds Contrast     Administrations occurring from 0924 to 1015 on 12/25/22:  Medication Name Total Dose  iohexol (OMNIPAQUE) 350 MG/ML injection 24 mL   Radiation/Fluoro  Fluoro time: 3 (min) DAP: 11297 (mGycm2) Cumulative Air Kerma: 211 (mGy) Coronary Findings  Diagnostic Dominance: Right Left Anterior Descending  Vessel is large.    Left Circumflex  Vessel is large.    Right Coronary Artery  Vessel is large.    Intervention   No interventions have been documented.   Coronary Diagrams  Diagnostic Dominance: Right  Intervention   Implants   No implant documentation for this case.   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  Dr. Lalla Brothers and I have discussed the case in detail and feel that a hybrid approach would be safest.  We will plan to do a sternotomy for cardiopulmonary bypass and removal of the right atrial and right ventricular portion of the leads as far up into the SVC as possible.  We would then wean the patient from  bypass and reverse heparin.  This can be followed by removal of the generator and residual leads percutaneously.  He will require an epicardial dual-chamber pacing system placed in a new pocket in the right infra clavicular region. I discussed the operative procedure with the patient including alternatives, benefits and risks; including but not limited to bleeding, blood transfusion, infection, stroke, myocardial infarction, tricuspid valve dysfunction, organ dysfunction, and death.  Jeffrey [name] understands and agrees to proceed.  We will schedule surgery for Monday next week.  [name] 12/25/2022, 6:51 PM

## 2022-12-25 NOTE — Progress Notes (Signed)
PT Cancellation Note  Patient Details Name: Jeffrey [name] MRN: [id number] DOB: 1965/05/30   Cancelled Treatment:    Reason Eval/Treat Not Completed: Patient at procedure or test/unavailable - off unit for [facility], PT to check back later as able.   Marye Round, PT DPT Acute Rehabilitation Services Secure Chat Preferred  Office [phone]     Asta Corbridge Sheliah Plane 12/25/2022, 10:03 AM

## 2022-12-25 NOTE — Inpatient Diabetes Management (Signed)
Inpatient Diabetes Program Recommendations  AACE/ADA: New Consensus Statement on Inpatient Glycemic Control   Target Ranges:  Prepandial:   less than 140 mg/dL      Peak postprandial:   less than 180 mg/dL (1-2 hours)      Critically ill patients:  140 - 180 mg/dL    Latest Reference Range & Units 12/24/22 07:52 12/24/22 12:24 12/24/22 16:56 12/24/22 20:46 12/25/22 07:46  Glucose-Capillary 70 - 99 mg/dL 90 161 (H) 096 (H) 95 82   Review of Glycemic Control  Diabetes history: DM2 Outpatient Diabetes medications: Metformin 500 mg BID (not taking), FreeStyle Libre3 Current orders for Inpatient glycemic control: Semglee 11 units daily, Novolog 0-15 units TID with meals, Novolog 0-5 units at bedtime, Metformin 500 mg BID   Inpatient Diabetes Program Recommendations:     Insulin: May want to consider decreasing Semglee to 8 units daily.   Thanks, Orlando Penner, RN, MSN, CDCES Diabetes Coordinator Inpatient Diabetes Program [phone] (Team Pager from 8am to 5pm)

## 2022-12-25 NOTE — H&P (View-Only) (Signed)
[address]       [place] [address]             [phone]      Cardiothoracic Surgery Consultation  Reason for Consult: Infected permanent pacemaker system. Referring Physician: Steffanie Dunn, MD  Bascom Levels is an 57 y.o. male.  HPI:   The patient is a 57 year old gentleman with history of poorly controlled diabetes, remote opioid dependence now on methadone for the past 30 years, PTSD, long QT syndrome, sick sinus syndrome, and syncope who had a permanent dual-chamber pacemaker implanted at [facility] more than 30 years ago.  He has subsequently undergone 2 generator changes.  He was now admitted with fatigue, nausea and vomiting, and shortness of breath was found to be in septic shock requiring vasopressors.  Blood cultures were positive for Staph aureus.  TEE on 12/23/2022 showed no evidence of valve infection but there was a small strand coming from the right atrial lead and unclear significance but suggesting possible CIED infection.  The patient was recently treated for an ulceration on the plantar surface of his right foot which has subsequently closed up and is no longer draining.  He has some pain in his right knee with moderate swelling on admission which has subsequently improved.  He does report a fall prior to admission and thinks he landed on his knees.  Initially could not bear weight but it has been getting better and he is now able to bear weight with rolling walker with minimal pain.  He was seen by orthopedic surgery today and they did not feel it was consistent with septic arthritis.  They did not recommend arthrocentesis.     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, Identity [address], serial # [id number]. [employer] rep must physically interrogate pacemaker.    PTSD (post-traumatic stress disorder) 12/19/2014   On Klonopin, Celexa  Potential benefits of a long term benzodiazepines  use as well as potential risks  and complications were explained to the patient and were aknowledged.    Skin abscess 03/09/2015   11/16 - small    Substance abuse (HCC)    opioids   Tremor 07/17/2016   ?etiology     Past Surgical History:  Procedure Laterality Date   HERNIA REPAIR Right    LIVER SURGERY     PPM GENERATOR CHANGEOUT N/A 08/14/2017   Procedure: PPM GENERATOR CHANGEOUT;  Surgeon: Duke Salvia, MD;  Location: [facility] INVASIVE CV LAB;  Service: Cardiovascular;  Laterality: N/A;   TEE WITHOUT CARDIOVERSION N/A 12/23/2022   Procedure: TRANSESOPHAGEAL ECHOCARDIOGRAM;  Surgeon: [name], MD;  Location: [facility] INVASIVE CV LAB;  Service: Cardiovascular;  Laterality: N/A;    Family History  Problem Relation Age of Onset   Depression Mother    Diabetes Mother    Arthritis Mother    Arthritis Father     Social History:  reports that he has never smoked. His smokeless tobacco use includes chew. He reports that he does not  drink alcohol and does not use drugs.  Allergies:  Allergies  Allergen Reactions   Codeine Itching   Penicillins Hives    Has patient had a PCN reaction causing immediate rash, facial/tongue/throat swelling, SOB or lightheadedness with hypotension: unknown Has patient had a PCN reaction causing severe rash involving mucus membranes or skin necrosis: unknown Has patient had a PCN reaction that required hospitalization unknown Has patient had a PCN reaction occurring within the last 10 years: No If all of the above answers are "NO", then may proceed with Cephalosporin use.     Medications: I have reviewed the patient's current medications. Prior to Admission:   Medications Prior to Admission  Medication Sig Dispense Refill Last Dose   ammonium lactate (AMLACTIN) 12 % cream Apply 1 Application topically as needed for dry skin. (Patient taking differently: Apply 1 Application topically as needed for dry skin (right foot- affected areas).) 385 g 0 Past Week   atenolol (TENORMIN) 50 MG tablet Take 1 tablet (50 mg total) by mouth daily. 30 tablet 11 12/16/2022   citalopram (CELEXA) 40 MG tablet Take 1 tablet (40 mg total) by mouth daily. Must keep 05/21/22 appt for future refills (Patient taking differently: Take 40 mg by mouth daily.) 30 tablet 5 12/16/2022   clonazePAM (KLONOPIN) 1 MG tablet TAKE 1 TABLET BY MOUTH TWICE A DAY (Patient taking differently: Take 1 mg by mouth 2 (two) times daily as needed for anxiety.) 60 tablet 2 Past Week   Continuous Blood Gluc Sensor (FREESTYLE LIBRE 3 SENSOR) MISC 1 Units by Does not apply route every 14 (fourteen) days. (Patient taking differently: Inject 1 Device into the skin every 14 (fourteen) days.) 6 each 3 Past Month   ibuprofen (ADVIL,MOTRIN) 200 MG tablet Take 400 mg by mouth every 6 (six) hours as needed for mild pain, moderate pain or headache.   unk   methadone (DOLOPHINE) 10 MG/ML solution Take 150 mg by mouth in the morning.   12/18/2022 at am   mupirocin ointment (BACTROBAN) 2 % On leg wound w/dressing change qd or bid (Patient taking differently: Apply 1 Application topically See admin instructions. Apply to affected areas of the right foot once a week) 30 g 0 Past Week   silver sulfADIAZINE (SILVADENE) 1 % cream Apply 1 Application topically daily. (Patient taking differently: Apply 1 Application topically See admin instructions. Apply to affected areas of the right foot 2 times a week) 50 g 0 Past Week   Vitamin D, Ergocalciferol, (DRISDOL) 1.25 MG (50000 UNIT) CAPS capsule Take 1 capsule (50,000 Units total) by mouth every 7 (seven) days. (Patient taking differently: Take 50,000 Units by mouth every Monday.) 8  capsule 0 12/15/2022   Cholecalciferol (VITAMIN D3) 2000 UNITS capsule Take 1 capsule (2,000 Units total) by mouth daily. (Patient not taking: Reported on 12/18/2022) 100 capsule 3 Not Taking   metFORMIN (GLUCOPHAGE) 500 MG tablet Take 1 tablet (500 mg total) by mouth 2 (two) times daily with a meal. (Patient not taking: Reported on 12/18/2022) 180 tablet 3 Not Taking   triamcinolone cream (KENALOG) 0.1 % Apply 1 application topically 3 (three) times daily. (Patient not taking: Reported on 12/18/2022) 45 g 1 Not Taking   valACYclovir (VALTREX) 1000 MG tablet Take 1 tablet (1,000 mg total) by mouth 3 (three) times daily. (Patient not taking: Reported on 12/18/2022) 21 tablet 0 Not Taking   Scheduled:  atenolol  50 mg Oral Daily   Chlorhexidine Gluconate Cloth  6 each Topical Daily  citalopram  40 mg Oral Daily   clonazePAM  1 mg Oral BID   colchicine  0.6 mg Oral Daily   [START ON 12/26/2022] enoxaparin (LOVENOX) injection  40 mg Subcutaneous Daily   insulin aspart  0-15 Units Subcutaneous TID WC   insulin aspart  0-5 Units Subcutaneous QHS   [START ON 12/26/2022] insulin glargine-yfgn  8 Units Subcutaneous Daily   methadone  150 mg Oral Daily   sodium chloride flush  10-40 mL Intracatheter Q12H   sodium chloride flush  3 mL Intravenous Q12H   Continuous:  sodium chloride Stopped (12/18/22 1346)   sodium chloride     sodium chloride Stopped (12/19/22 1510)   sodium chloride 250 mL (12/25/22 1701)    ceFAZolin (ANCEF) IV 2 g (12/25/22 1700)   OZD:GUYQI/HKVQQVZD arterial line **AND** sodium chloride, sodium chloride, sodium chloride, acetaminophen **OR** acetaminophen, albuterol, mouth rinse, sodium chloride flush, sodium chloride flush, traZODone Anti-infectives (From admission, onward)    Start     Dose/Rate Route Frequency Ordered Stop   12/19/22 0000  vancomycin (VANCOCIN) IVPB 1000 mg/200 mL premix  Status:  Discontinued        1,000 mg 200 mL/hr over 60 Minutes Intravenous Every 12  hours 12/18/22 0952 12/18/22 1804   12/18/22 2200  ceFAZolin (ANCEF) IVPB 2g/100 mL premix        2 g 200 mL/hr over 30 Minutes Intravenous Every 8 hours 12/18/22 1804     12/18/22 1000  metroNIDAZOLE (FLAGYL) IVPB 500 mg  Status:  Discontinued        500 mg 100 mL/hr over 60 Minutes Intravenous Every 12 hours 12/18/22 0930 12/18/22 0952   12/18/22 1000  vancomycin (VANCOREADY) IVPB 2000 mg/400 mL        2,000 mg 200 mL/hr over 120 Minutes Intravenous  Once 12/18/22 0952 12/18/22 1229   12/18/22 1000  meropenem (MERREM) 1 g in sodium chloride 0.9 % 100 mL IVPB  Status:  Discontinued        1 g 200 mL/hr over 30 Minutes Intravenous Every 8 hours 12/18/22 0952 12/18/22 1804   12/18/22 0445  aztreonam (AZACTAM) 1 g in sodium chloride 0.9 % 100 mL IVPB        1 g 200 mL/hr over 30 Minutes Intravenous  Once 12/18/22 0431 12/18/22 0553       Results for orders placed or performed during the hospital encounter of 12/18/22 (from the past 48 hour(s))  Glucose, capillary     Status: Abnormal   Collection Time: 12/23/22  9:27 PM  Result Value Ref Range   Glucose-Capillary 113 (H) 70 - 99 mg/dL    Comment: Glucose reference range applies only to samples taken after fasting for at least 8 hours.  CBC     Status: Abnormal   Collection Time: 12/24/22  3:46 AM  Result Value Ref Range   WBC 8.0 4.0 - 10.5 K/uL   RBC 3.71 (L) 4.22 - 5.81 MIL/uL   Hemoglobin 10.5 (L) 13.0 - 17.0 g/dL   HCT 63.8 (L) 75.6 - 43.3 %   MCV 88.4 80.0 - 100.0 fL   MCH 28.3 26.0 - 34.0 pg   MCHC 32.0 30.0 - 36.0 g/dL   RDW 29.5 18.8 - 41.6 %   Platelets 114 (L) 150 - 400 K/uL    Comment: REPEATED TO VERIFY   nRBC 0.0 0.0 - 0.2 %    Comment: Performed at [facility], [address] [place], [place] [address]  Comprehensive metabolic panel     Status: Abnormal   Collection Time: 12/24/22  3:46 AM  Result Value Ref Range   Sodium 137 135 - 145 mmol/L   Potassium 3.3 (L) 3.5 - 5.1 mmol/L   Chloride 104 98 -  111 mmol/L   CO2 26 22 - 32 mmol/L   Glucose, Bld 83 70 - 99 mg/dL    Comment: Glucose reference range applies only to samples taken after fasting for at least 8 hours.   BUN 9 6 - 20 mg/dL   Creatinine, Ser 4.09 0.61 - 1.24 mg/dL   Calcium 7.5 (L) 8.9 - 10.3 mg/dL   Total Protein 5.7 (L) 6.5 - 8.1 g/dL   Albumin 2.1 (L) 3.5 - 5.0 g/dL   AST 43 (H) 15 - 41 U/L   ALT 14 0 - 44 U/L   Alkaline Phosphatase 93 38 - 126 U/L   Total Bilirubin 1.2 0.3 - 1.2 mg/dL   GFR, Estimated >81 >19 mL/min    Comment: (NOTE) Calculated using the CKD-EPI Creatinine Equation (2021)    Anion gap 7 5 - 15    Comment: Performed at [facility], [address] [place], [place] [address]  Glucose, capillary     Status: None   Collection Time: 12/24/22  7:52 AM  Result Value Ref Range   Glucose-Capillary 90 70 - 99 mg/dL    Comment: Glucose reference range applies only to samples taken after fasting for at least 8 hours.  Glucose, capillary     Status: Abnormal   Collection Time: 12/24/22 12:24 PM  Result Value Ref Range   Glucose-Capillary 129 (H) 70 - 99 mg/dL    Comment: Glucose reference range applies only to samples taken after fasting for at least 8 hours.  Glucose, capillary     Status: Abnormal   Collection Time: 12/24/22  4:56 PM  Result Value Ref Range   Glucose-Capillary 112 (H) 70 - 99 mg/dL    Comment: Glucose reference range applies only to samples taken after fasting for at least 8 hours.  Glucose, capillary     Status: None   Collection Time: 12/24/22  8:46 PM  Result Value Ref Range   Glucose-Capillary 95 70 - 99 mg/dL    Comment: Glucose reference range applies only to samples taken after fasting for at least 8 hours.  CBC     Status: Abnormal   Collection Time: 12/25/22  3:52 AM  Result Value Ref Range   WBC 12.5 (H) 4.0 - 10.5 K/uL   RBC 4.16 (L) 4.22 - 5.81 MIL/uL   Hemoglobin 11.9 (L) 13.0 - 17.0 g/dL   HCT 95.6 (L) 21.3 - 08.6 %   MCV 89.9 80.0 - 100.0 fL   MCH 28.6  26.0 - 34.0 pg   MCHC 31.8 30.0 - 36.0 g/dL   RDW 57.8 46.9 - 62.9 %   Platelets 166 150 - 400 K/uL   nRBC 0.0 0.0 - 0.2 %    Comment: Performed at [facility], [address] [place], [place] [address]  Comprehensive metabolic panel     Status: Abnormal   Collection Time: 12/25/22  3:52 AM  Result Value Ref Range   Sodium 135 135 - 145 mmol/L   Potassium 3.6 3.5 - 5.1 mmol/L   Chloride 102 98 - 111 mmol/L   CO2 23 22 - 32 mmol/L   Glucose, Bld 98 70 - 99 mg/dL    Comment: Glucose reference range  applies only to samples taken after fasting for at least 8 hours.   BUN 12 6 - 20 mg/dL   Creatinine, Ser 0.10 0.61 - 1.24 mg/dL   Calcium 7.6 (L) 8.9 - 10.3 mg/dL   Total Protein 6.6 6.5 - 8.1 g/dL   Albumin 2.4 (L) 3.5 - 5.0 g/dL   AST 46 (H) 15 - 41 U/L   ALT 13 0 - 44 U/L   Alkaline Phosphatase 104 38 - 126 U/L   Total Bilirubin 1.0 0.3 - 1.2 mg/dL   GFR, Estimated >27 >25 mL/min    Comment: (NOTE) Calculated using the CKD-EPI Creatinine Equation (2021)    Anion gap 10 5 - 15    Comment: Performed at [facility], [address], [place], [place] [address]  Glucose, capillary     Status: None   Collection Time: 12/25/22  7:46 AM  Result Value Ref Range   Glucose-Capillary 82 70 - 99 mg/dL    Comment: Glucose reference range applies only to samples taken after fasting for at least 8 hours.  Glucose, capillary     Status: None   Collection Time: 12/25/22 12:15 PM  Result Value Ref Range   Glucose-Capillary 82 70 - 99 mg/dL    Comment: Glucose reference range applies only to samples taken after fasting for at least 8 hours.  Glucose, capillary     Status: Abnormal   Collection Time: 12/25/22  4:18 PM  Result Value Ref Range   Glucose-Capillary 60 (L) 70 - 99 mg/dL    Comment: Glucose reference range applies only to samples taken after fasting for at least 8 hours.  Glucose, capillary     Status: None   Collection Time: 12/25/22  5:09 PM  Result Value Ref Range    Glucose-Capillary 87 70 - 99 mg/dL    Comment: Glucose reference range applies only to samples taken after fasting for at least 8 hours.    CARDIAC CATHETERIZATION  Result Date: 12/25/2022 No angiographic evidence of CAD LVEDP 16 mmHg Recommendations: No further ischemic workup    Review of Systems  Constitutional:  Negative for appetite change, chills, fever and unexpected weight change.  HENT: Negative.    Respiratory:  Negative for shortness of breath.   Cardiovascular:  Negative for chest pain and leg swelling.  Gastrointestinal: Negative.   Endocrine: Negative.   Genitourinary: Negative.   Musculoskeletal:        Right knee pain that is improving  Skin: Negative.   Allergic/Immunologic: Negative.   Neurological:  Negative for dizziness, syncope and headaches.  Hematological: Negative.   Psychiatric/Behavioral:         History of PTSD and opioid dependence   Blood pressure 112/67, pulse 61, temperature 98.4 F (36.9 C), temperature source Oral, resp. rate 16, height 6\' 1"  (1.854 m), weight 107.9 kg, SpO2 94%. Physical Exam Constitutional:      Appearance: Normal appearance. He is obese.  HENT:     Head: Normocephalic and atraumatic.  Eyes:     Extraocular Movements: Extraocular movements intact.     Conjunctiva/sclera: Conjunctivae normal.     Pupils: Pupils are equal, round, and reactive to light.  Cardiovascular:     Rate and Rhythm: Normal rate and regular rhythm.     Pulses: Normal pulses.     Heart sounds: Normal heart sounds. No murmur heard. Pulmonary:     Effort: Pulmonary effort is normal.     Breath sounds: Normal breath sounds.  Abdominal:  General: Bowel sounds are normal. There is no distension.     Tenderness: There is no abdominal tenderness.  Musculoskeletal:     Comments: There is slight swelling of the right knee with no erythema or warmth.  Skin:    General: Skin is warm and dry.     Comments: Ulcer on the plantar surface of the right foot  is almost healed and dry.  There is no sign of infection.  Neurological:     General: No focal deficit present.     Mental Status: He is alert and oriented to person, place, and time.  Psychiatric:        Mood and Affect: Mood normal.        Behavior: Behavior normal.   Physicians  Panel Physicians Referring Physician Case Authorizing Physician  Kathleene Hazel, MD (Primary)     Procedures  LEFT HEART CATH AND CORONARY ANGIOGRAPHY   Conclusion  No angiographic evidence of CAD LVEDP 16 mmHg   Recommendations: No further ischemic workup   Surgeon Notes    12/23/2022  1:37 PM CV Procedure signed by [name], MD   Indications  Precordial pain [R07.2 (ICD-10-CM)]   Procedural Details  Technical Details Indication: Chest pain. Pending need for surgical removal of pacemaker lead  Procedure: The risks, benefits, complications, treatment options, and expected outcomes were discussed with the patient. The patient and/or family concurred with the proposed plan, giving informed consent. The patient was sedated with Versed and Fentanyl. The right wrist was prepped and draped in a sterile fashion. 1% lidocaine was used for local anesthesia. Using the modified Seldinger access technique, a 5 French sheath was placed in the right radial artery. 3 mg Verapamil was given through the sheath. Weight based IV heparin was given. Standard diagnostic catheters were used to perform selective coronary angiography. LV pressures measured with the JR4 catheter. All catheter exchanges were performed over an exchange length guidewire.   The sheath was removed from the right radial artery and a hemostasis band was applied at the arteriotomy site on the right wrist.      Estimated blood loss <50 mL.   During this procedure medications were administered to achieve and maintain moderate conscious sedation while the patient's heart rate, blood pressure, and oxygen saturation were continuously  monitored and I was present face-to-face 100% of this time.   Medications (Filter: Administrations occurring from [phone] to 1015 on 12/25/22)  important  Continuous medications are totaled by the amount administered until 12/25/22 1015.   fentaNYL (SUBLIMAZE) injection (mcg)  Total dose: 25 mcg Date/Time Rate/Dose/Volume Action   12/25/22 0945 25 mcg Given   midazolam (VERSED) injection (mg)  Total dose: 1 mg Date/Time Rate/Dose/Volume Action   12/25/22 0945 1 mg Given   lidocaine (PF) (XYLOCAINE) 1 % injection (mL)  Total volume: 2 mL Date/Time Rate/Dose/Volume Action   12/25/22 0953 2 mL Given   Radial Cocktail/Verapamil only (mL)  Total volume: 10 mL Date/Time Rate/Dose/Volume Action   12/25/22 0954 10 mL Given   heparin sodium (porcine) injection (Units)  Total dose: 5,500 Units Date/Time Rate/Dose/Volume Action   12/25/22 0954 5,500 Units Given   iohexol (OMNIPAQUE) 350 MG/ML injection (mL)  Total volume: 24 mL Date/Time Rate/Dose/Volume Action   12/25/22 1004 24 mL Given   Heparin (Porcine) in NaCl 1000-0.9 UT/500ML-% SOLN (mL)  Total volume: 1,000 mL Date/Time Rate/Dose/Volume Action   12/25/22 1004 1,000 mL Given   0.9 %  sodium chloride infusion (mL/hr)  Total  dose: Cannot be calculated* Dosing weight: 111.5 *Administration dose not documented Date/Time Rate/Dose/Volume Action   12/25/22 0924 *Not included in total MAR Hold   albuterol (PROVENTIL) (2.5 MG/3ML) 0.083% nebulizer solution 2.5 mg (mg)  Total dose: Cannot be calculated* Dosing weight: 117.9 *Administration dose not documented Date/Time Rate/Dose/Volume Action   12/25/22 0924 *Not included in total MAR Hold   atenolol (TENORMIN) tablet 50 mg (mg)  Total dose: Cannot be calculated* Dosing weight: 111.5 *Administration dose not documented Date/Time Rate/Dose/Volume Action   12/25/22 0924 *Not included in total MAR Hold   1000 *Not included in total Automatically Held   ceFAZolin (ANCEF) IVPB 2g/100 mL  premix (mL/hr)  Total dose: Cannot be calculated* Dosing weight: 111.5 *Administration dose not documented Date/Time Rate/Dose/Volume Action   12/25/22 0924 *Not included in total MAR Hold   1000 *Not included in total Automatically Held   Chlorhexidine Gluconate Cloth 2 % PADS 6 each (each)  Total dose: Cannot be calculated* Dosing weight: 117.9 *Administration dose not documented Date/Time Rate/Dose/Volume Action   12/25/22 0924 *Not included in total MAR Hold   1000 *Not included in total Automatically Held   citalopram (CELEXA) tablet 40 mg (mg)  Total dose: Cannot be calculated* Dosing weight: 111.5 *Administration dose not documented Date/Time Rate/Dose/Volume Action   12/25/22 0924 *Not included in total MAR Hold   1000 *Not included in total Automatically Held   clonazePAM (KLONOPIN) tablet 1 mg (mg)  Total dose: Cannot be calculated* Dosing weight: 111.5 *Administration dose not documented Date/Time Rate/Dose/Volume Action   12/25/22 0924 *Not included in total MAR Hold   colchicine tablet 0.6 mg (mg)  Total dose: Cannot be calculated* Dosing weight: 111.5 *Administration dose not documented Date/Time Rate/Dose/Volume Action   12/25/22 0924 *Not included in total MAR Hold   1000 *Not included in total Automatically Held   insulin aspart (novoLOG) injection 0-15 Units (Units)  Total dose: Cannot be calculated* Dosing weight: 117.9 *Administration dose not documented Date/Time Rate/Dose/Volume Action   12/25/22 0924 *Not included in total MAR Hold   insulin aspart (novoLOG) injection 0-5 Units (Units)  Total dose: Cannot be calculated* Dosing weight: 117.9 *Administration dose not documented Date/Time Rate/Dose/Volume Action   12/25/22 0924 *Not included in total MAR Hold   methadone (DOLOPHINE) 10 MG/ML solution 150 mg (mg)  Total dose: Cannot be calculated* Dosing weight: 111.5 *Administration dose not documented Date/Time Rate/Dose/Volume Action   12/25/22 0924  *Not included in total MAR Hold   Oral care mouth rinse (mL)  Total dose: Cannot be calculated* Dosing weight: 111.5 *Administration dose not documented Date/Time Rate/Dose/Volume Action   12/25/22 0924 *Not included in total MAR Hold   sodium chloride flush (NS) 0.9 % injection 10-40 mL (mL)  Total dose: Cannot be calculated* Dosing weight: 111.5 *Administration dose not documented Date/Time Rate/Dose/Volume Action   12/25/22 0924 *Not included in total MAR Hold   1000 *Not included in total Automatically Held   sodium chloride flush (NS) 0.9 % injection 10-40 mL (mL)  Total dose: Cannot be calculated* Dosing weight: 111.5 *Administration dose not documented Date/Time Rate/Dose/Volume Action   12/25/22 0924 *Not included in total MAR Hold   traZODone (DESYREL) tablet 25 mg (mg)  Total dose: Cannot be calculated* Dosing weight: 117.9 *Administration dose not documented Date/Time Rate/Dose/Volume Action   12/25/22 0924 *Not included in total MAR Hold   amLODipine (NORVASC) tablet 5 mg (mg)  Total dose: Cannot be calculated* Dosing weight: 109.4 *Administration dose not documented Date/Time Rate/Dose/Volume Action  12/25/22 1610 *Not included in total MAR Hold   1000 *Not included in total Automatically Held   enoxaparin (LOVENOX) injection 40 mg (mg)  Total dose: Cannot be calculated* Dosing weight: 117.9 *Administration dose not documented Date/Time Rate/Dose/Volume Action   12/25/22 0924 *Not included in total MAR Hold   1000 *Not included in total Automatically Held   insulin glargine-yfgn (SEMGLEE) injection 11 Units (Units)  Total dose: Cannot be calculated* Dosing weight: 111.5 *Administration dose not documented Date/Time Rate/Dose/Volume Action   12/25/22 0924 *Not included in total MAR Hold   0932 *Not included in total MAR Unhold   metFORMIN (GLUCOPHAGE) tablet 500 mg (mg)  Total dose: Cannot be calculated* Dosing weight: 111.5 *Administration dose not  documented Date/Time Rate/Dose/Volume Action   12/25/22 0924 *Not included in total MAR Hold    Sedation Time  Sedation Time Physician-1: 16 minutes 46 seconds Contrast     Administrations occurring from 0924 to 1015 on 12/25/22:  Medication Name Total Dose  iohexol (OMNIPAQUE) 350 MG/ML injection 24 mL   Radiation/Fluoro  Fluoro time: 3 (min) DAP: 11297 (mGycm2) Cumulative Air Kerma: 211 (mGy) Coronary Findings  Diagnostic Dominance: Right Left Anterior Descending  Vessel is large.    Left Circumflex  Vessel is large.    Right Coronary Artery  Vessel is large.    Intervention   No interventions have been documented.   Coronary Diagrams  Diagnostic Dominance: Right  Intervention   Implants   No implant documentation for this case.   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  Dr. Lalla Brothers and I have discussed the case in detail and feel that a hybrid approach would be safest.  We will plan to do a sternotomy for cardiopulmonary bypass and removal of the right atrial and right ventricular portion of the leads as far up into the SVC as possible.  We would then wean the patient from  bypass and reverse heparin.  This can be followed by removal of the generator and residual leads percutaneously.  He will require an epicardial dual-chamber pacing system placed in a new pocket in the right infra clavicular region. I discussed the operative procedure with the patient including alternatives, benefits and risks; including but not limited to bleeding, blood transfusion, infection, stroke, myocardial infarction, tricuspid valve dysfunction, organ dysfunction, and death.  Gwenith Daily Marchiano understands and agrees to proceed.  We will schedule surgery for Monday next week.  [name] 12/25/2022, 6:51 PM

## 2022-12-25 NOTE — Plan of Care (Signed)
  Problem: Clinical Measurements: Goal: Ability to maintain clinical measurements within normal limits will improve Outcome: Progressing   

## 2022-12-25 NOTE — Progress Notes (Signed)
PROGRESS NOTE    Jeffrey [name]  MWU:[id number] DOB: Jul 16, 1965 DOA: 12/18/2022 PCP: Tresa Garter, MD    Brief Narrative:  57 year old white male BMI 32 prior PTSD chronic BZD, opioid dependence previously on methadone.  Presented to the ED 9/25 fatigue nausea vomiting, SOB, Tmax 100.8 tachycardic slightly low blood pressure lactic acid 2.9-hypotension progressed to became septic started on Neo-Synephrine transferred to ICU.   BC ID MSSA. Tx to [facility] for TEE on 10/1.   Assessment and Plan: Sepsis secondary to infected pacemaker wires? /Myositis based on CT scan of right knee Continuing abx per ID -ID consulted -s/p TEE on 10/1 -EP following -LHC: 10/3 Tentatively planned for OR 10/7 -ortho consult: evaluate his right knee for septic arthritis/ arthrocentesis for cell count and crystals and culture    Acute thrombocytopenia on admission likely from mild DIC now resolving -trending up   AKI on admission with hyponatremia/hypokalemia and metabolic acidosis -resolved   Diabetic ulcer managed by podiatrist Wound looks well-healed on right lower extremity-outpatient follow-up with Dr. 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   Weight as of this encounter: 107.9 kg.   Diabetes mellitus TY 2 D/c metformin -SSI and long acting   HTN -d/c norvasc   Chronic pain Methadone dependence Continue methadone 150 has been on this for many many years- - continue Celexa 40 daily    DVT prophylaxis: enoxaparin (LOVENOX) injection 40 mg Start: 12/26/22 1000 SCDs Start: 12/18/22 0931    Code Status: Full Code   Disposition Plan:  Level of care: Telemetry Cardiac Status is: Inpatient Remains inpatient appropriate    Consultants:  ID EP   Subjective: Getting LHC  Objective: Vitals:   12/25/22 1000  12/25/22 1005 12/25/22 1026 12/25/22 1049  BP: 122/66 113/69 111/67 108/69  Pulse: 66 65  61  Resp: 17 18    Temp:      TempSrc:      SpO2: 99% 99%  94%  Weight:      Height:       No intake or output data in the 24 hours ending 12/25/22 1120  Filed Weights   12/18/22 1305 12/24/22 0341 12/25/22 0345  Weight: 111.5 kg 109.4 kg 107.9 kg    Examination:   General: Appearance:    Obese male in no acute distress     Lungs:      respirations unlabored  Heart:    Normal heart rate. Normal rhythm. No murmurs, rubs, or gallops.    MS:   All extremities are intact.    Neurologic:   Awake, alert, oriented x 3. No apparent focal neurological           defect.        Data Reviewed: I have personally reviewed following labs and imaging studies  CBC: Recent Labs  Lab 12/19/22 0520 12/20/22 0553 12/23/22 0522 12/24/22 0346 12/25/22 0352  WBC 18.2* 8.8 7.6 8.0 12.5*  NEUTROABS  --   --  5.6  --   --   HGB 11.7* 11.2* 10.7* 10.5* 11.9*  HCT 35.4* 33.4* 33.3* 32.8* 37.4*  MCV 87.4 87.7 88.1 88.4 89.9  PLT 109* 87* 100* 114* 166   Basic Metabolic Panel: Recent Labs  Lab 12/18/22 1616 12/19/22 0755 12/20/22 0553 12/22/22 0931 12/23/22 0522 12/23/22 1116 12/24/22  0346 12/25/22 0352  NA 133* 130* 131* 139 134* 136 137 135  K 3.3* 3.7 3.6 3.0* 3.7 3.3* 3.3* 3.6  CL 104 103 102 104 101 102 104 102  CO2 17* 19* 22 28 26 24 26 23   GLUCOSE 254* 269* 252* 109* 165* 122* 83 98  BUN 16 23* 22* 10 8 9 9 12   CREATININE 1.52* 1.32* 0.99 0.82 0.58* 0.76 1.06 1.09  CALCIUM 7.0* 6.8* 7.1* 7.2* 7.6* 7.8* 7.5* 7.6*  MG 1.0* 2.0 2.2 1.8 2.3  --   --   --   PHOS <1.0* 3.0 1.8* 2.4* 3.3  --   --   --    GFR: Estimated Creatinine Clearance: 94 mL/min (by C-G formula based on SCr of 1.09 mg/dL). Liver Function Tests: Recent Labs  Lab 12/18/22 1616 12/23/22 1116 12/24/22 0346 12/25/22 0352  AST 69* 55* 43* 46*  ALT 32 16 14 13   ALKPHOS 46 115 93 104  BILITOT 1.6* 0.9 1.2 1.0   PROT 5.6* 6.4* 5.7* 6.6  ALBUMIN 2.3* 2.6* 2.1* 2.4*   Recent Labs  Lab 12/18/22 1616  LIPASE 17  AMYLASE 32   No results for input(s): "AMMONIA" in the last 168 hours. Coagulation Profile: No results for input(s): "INR", "PROTIME" in the last 168 hours.  Cardiac Enzymes: No results for input(s): "CKTOTAL", "CKMB", "CKMBINDEX", "TROPONINI" in the last 168 hours. BNP (last 3 results) No results for input(s): "PROBNP" in the last 8760 hours. HbA1C: No results for input(s): "HGBA1C" in the last 72 hours. CBG: Recent Labs  Lab 12/24/22 0752 12/24/22 1224 12/24/22 1656 12/24/22 2046 12/25/22 0746  GLUCAP 90 129* 112* 95 82   Lipid Profile: No results for input(s): "CHOL", "HDL", "LDLCALC", "TRIG", "CHOLHDL", "LDLDIRECT" in the last 72 hours. Thyroid Function Tests: No results for input(s): "TSH", "T4TOTAL", "FREET4", "T3FREE", "THYROIDAB" in the last 72 hours. Anemia Panel: No results for input(s): "VITAMINB12", "FOLATE", "FERRITIN", "TIBC", "IRON", "RETICCTPCT" in the last 72 hours. Sepsis Labs: Recent Labs  Lab 12/18/22 1616 12/18/22 2131 12/19/22 0015  LATICACIDVEN 5.8* 3.4* 3.0*    Recent Results (from the past 240 hour(s))  Resp panel by RT-PCR (RSV, Flu A&B, Covid) Anterior Nasal Swab     Status: None   Collection Time: 12/18/22  2:39 AM   Specimen: Anterior Nasal Swab  Result Value Ref Range Status   SARS Coronavirus 2 by RT PCR NEGATIVE NEGATIVE Final    Comment: (NOTE) SARS-CoV-2 target nucleic acids are NOT DETECTED.  The SARS-CoV-2 RNA is generally detectable in upper respiratory specimens during the acute phase of infection. The lowest concentration of SARS-CoV-2 viral copies this assay can detect is 138 copies/mL. A negative result does not preclude SARS-Cov-2 infection and should not be used as the sole basis for treatment or other patient management decisions. A negative result may occur with  improper specimen collection/handling, submission of  specimen other than nasopharyngeal swab, presence of viral mutation(s) within the areas targeted by this assay, and inadequate number of viral copies(<138 copies/mL). A negative result must be combined with clinical observations, patient history, and epidemiological information. The expected result is Negative.  Fact Sheet for Patients:  BloggerCourse.com  Fact Sheet for Healthcare Providers:  SeriousBroker.it  This test is no t yet approved or cleared by the [place] FDA and  has been authorized for detection and/or diagnosis of SARS-CoV-2 by FDA under an Emergency Use Authorization (EUA). This EUA will remain  in effect (meaning this test can be used) for  the duration of the COVID-19 declaration under Section 564(b)(1) of the Act, 21 U.S.C.section 360bbb-3(b)(1), unless the authorization is terminated  or revoked sooner.       Influenza A by PCR NEGATIVE NEGATIVE Final   Influenza B by PCR NEGATIVE NEGATIVE Final    Comment: (NOTE) The Xpert Xpress SARS-CoV-2/FLU/RSV plus assay is intended as an aid in the diagnosis of influenza from Nasopharyngeal swab specimens and should not be used as a sole basis for treatment. Nasal washings and aspirates are unacceptable for Xpert Xpress SARS-CoV-2/FLU/RSV testing.  Fact Sheet for Patients: BloggerCourse.com  Fact Sheet for Healthcare Providers: SeriousBroker.it  This test is not yet approved or cleared by the [place] FDA and has been authorized for detection and/or diagnosis of SARS-CoV-2 by FDA under an Emergency Use Authorization (EUA). This EUA will remain in effect (meaning this test can be used) for the duration of the COVID-19 declaration under Section 564(b)(1) of the Act, 21 U.S.C. section 360bbb-3(b)(1), unless the authorization is terminated or revoked.     Resp Syncytial Virus by PCR NEGATIVE NEGATIVE Final     Comment: (NOTE) Fact Sheet for Patients: BloggerCourse.com  Fact Sheet for Healthcare Providers: SeriousBroker.it  This test is not yet approved or cleared by the [place] FDA and has been authorized for detection and/or diagnosis of SARS-CoV-2 by FDA under an Emergency Use Authorization (EUA). This EUA will remain in effect (meaning this test can be used) for the duration of the COVID-19 declaration under Section 564(b)(1) of the Act, 21 U.S.C. section 360bbb-3(b)(1), unless the authorization is terminated or revoked.  Performed at [facility], [address], [place], [place] [address]   Culture, blood (Routine x 2)     Status: Abnormal   Collection Time: 12/18/22  2:41 AM   Specimen: BLOOD RIGHT FOREARM  Result Value Ref Range Status   Specimen Description   Final    BLOOD RIGHT FOREARM Performed at [facility], [address] [place], [place] [address]    Special Requests   Final    BOTTLES DRAWN AEROBIC AND ANAEROBIC Blood Culture results may not be optimal due to an excessive volume of blood received in culture bottles Performed at [facility], [address] [place], [place] [address]    Culture  Setup Time   Final    GRAM POSITIVE COCCI IN CLUSTERS IN BOTH AEROBIC AND ANAEROBIC BOTTLES CRITICAL VALUE NOTED.  VALUE IS CONSISTENT WITH PREVIOUSLY REPORTED AND CALLED VALUE.    Culture (A)  Final    STAPHYLOCOCCUS AUREUS SUSCEPTIBILITIES PERFORMED ON PREVIOUS CULTURE WITHIN THE LAST 5 DAYS. Performed at [facility], [address] [place], [place] [address]    Report Status 12/20/2022 FINAL  Final  Culture, blood (Routine x 2)     Status: Abnormal   Collection Time: 12/18/22  2:49 AM   Specimen: BLOOD LEFT FOREARM  Result Value Ref Range Status   Specimen Description   Final    BLOOD LEFT FOREARM Performed at [facility], [address]  [place], [place] [address]    Special Requests   Final    BOTTLES DRAWN AEROBIC AND ANAEROBIC Blood Culture results may not be optimal due to an excessive volume of blood received in culture bottles Performed at [facility], [address] [place], [place] [address]    Culture  Setup Time   Final    GRAM POSITIVE COCCI IN CLUSTERS IN BOTH AEROBIC  AND ANAEROBIC BOTTLES CRITICAL RESULT CALLED TO, READ BACK BY AND VERIFIED WITH: PHARMD C. Clearance Coots [id number] @1714  FH Performed at [facility], [address], [place], [place] [address]    Culture STAPHYLOCOCCUS AUREUS (A)  Final   Report Status 12/20/2022 FINAL  Final   Organism ID, Bacteria STAPHYLOCOCCUS AUREUS  Final      Susceptibility   Staphylococcus aureus - MIC*    CIPROFLOXACIN >=8 RESISTANT Resistant     ERYTHROMYCIN >=8 RESISTANT Resistant     GENTAMICIN <=0.5 SENSITIVE Sensitive     OXACILLIN 0.5 SENSITIVE Sensitive     TETRACYCLINE <=1 SENSITIVE Sensitive     VANCOMYCIN <=0.5 SENSITIVE Sensitive     TRIMETH/SULFA >=320 RESISTANT Resistant     CLINDAMYCIN <=0.25 SENSITIVE Sensitive     RIFAMPIN <=0.5 SENSITIVE Sensitive     Inducible Clindamycin NEGATIVE Sensitive     LINEZOLID 2 SENSITIVE Sensitive     * STAPHYLOCOCCUS AUREUS  Blood Culture ID Panel (Reflexed)     Status: Abnormal   Collection Time: 12/18/22  2:49 AM  Result Value Ref Range Status   Enterococcus faecalis NOT DETECTED NOT DETECTED Final   Enterococcus Faecium NOT DETECTED NOT DETECTED Final   Listeria monocytogenes NOT DETECTED NOT DETECTED Final   Staphylococcus species DETECTED (A) NOT DETECTED Final    Comment: CRITICAL RESULT CALLED TO, READ BACK BY AND VERIFIED WITH: PHARMD C. SHADE [id number] @1714  FH    Staphylococcus aureus (BCID) DETECTED (A) NOT DETECTED Final    Comment: CRITICAL RESULT CALLED TO, READ BACK BY AND VERIFIED WITH: PHARMD C. SHADE [id number] @1714  FH    Staphylococcus epidermidis NOT DETECTED NOT DETECTED Final    Staphylococcus lugdunensis NOT DETECTED NOT DETECTED Final   Streptococcus species NOT DETECTED NOT DETECTED Final   Streptococcus agalactiae NOT DETECTED NOT DETECTED Final   Streptococcus pneumoniae NOT DETECTED NOT DETECTED Final   Streptococcus pyogenes NOT DETECTED NOT DETECTED Final   A.calcoaceticus-baumannii NOT DETECTED NOT DETECTED Final   Bacteroides fragilis NOT DETECTED NOT DETECTED Final   Enterobacterales NOT DETECTED NOT DETECTED Final   Enterobacter cloacae complex NOT DETECTED NOT DETECTED Final   Escherichia coli NOT DETECTED NOT DETECTED Final   Klebsiella aerogenes NOT DETECTED NOT DETECTED Final   Klebsiella oxytoca NOT DETECTED NOT DETECTED Final   Klebsiella pneumoniae NOT DETECTED NOT DETECTED Final   Proteus species NOT DETECTED NOT DETECTED Final   Salmonella species NOT DETECTED NOT DETECTED Final   Serratia marcescens NOT DETECTED NOT DETECTED Final   Haemophilus influenzae NOT DETECTED NOT DETECTED Final   Neisseria meningitidis NOT DETECTED NOT DETECTED Final   Pseudomonas aeruginosa NOT DETECTED NOT DETECTED Final   Stenotrophomonas maltophilia NOT DETECTED NOT DETECTED Final   Candida albicans NOT DETECTED NOT DETECTED Final   Candida auris NOT DETECTED NOT DETECTED Final   Candida glabrata NOT DETECTED NOT DETECTED Final   Candida krusei NOT DETECTED NOT DETECTED Final   Candida parapsilosis NOT DETECTED NOT DETECTED Final   Candida tropicalis NOT DETECTED NOT DETECTED Final   Cryptococcus neoformans/gattii NOT DETECTED NOT DETECTED Final   Meth resistant mecA/C and MREJ NOT DETECTED NOT DETECTED Final    Comment: Performed at [facility], [address] [place], [place] [address]  MRSA Next Gen by PCR, Nasal     Status: None   Collection Time: 12/18/22  1:01 PM   Specimen: Nasal Mucosa; Nasal Swab  Result Value Ref Range Status   MRSA by PCR Next Gen  NOT DETECTED NOT DETECTED Final    Comment: (NOTE) The GeneXpert MRSA Assay (FDA approved  for NASAL specimens only), is one component of a comprehensive MRSA colonization surveillance program. It is not intended to diagnose MRSA infection nor to guide or monitor treatment for MRSA infections. Test performance is not FDA approved in patients less than 37 years old. Performed at [facility], [address] [place], [place] [address]   Culture, blood (Routine X 2) w Reflex to ID Panel     Status: None   Collection Time: 12/19/22  3:33 AM   Specimen: BLOOD RIGHT HAND  Result Value Ref Range Status   Specimen Description   Final    BLOOD RIGHT HAND Performed at [facility], [address], [place], [place] [address]    Special Requests   Final    BOTTLES DRAWN AEROBIC AND ANAEROBIC Blood Culture adequate volume Performed at [facility], [address], [place], [place] [address]    Culture   Final    NO GROWTH 5 DAYS Performed at [facility], [address], [place], [place] [address]    Report Status 12/24/2022 FINAL  Final  Culture, blood (Routine X 2) w Reflex to ID Panel     Status: None   Collection Time: 12/19/22  3:33 AM   Specimen: BLOOD RIGHT FOREARM  Result Value Ref Range Status   Specimen Description   Final    BLOOD RIGHT FOREARM Performed at [facility], [address] [place], [place] [address]    Special Requests   Final    BOTTLES DRAWN AEROBIC AND ANAEROBIC Blood Culture adequate volume Performed at [facility], [address] [place], [place] [address]    Culture   Final    NO GROWTH 5 DAYS Performed at [facility], [address], [place], [place] [address]    Report Status 12/24/2022 FINAL  Final         Radiology Studies: CARDIAC CATHETERIZATION  Result Date: 12/25/2022 No angiographic evidence of CAD LVEDP 16 mmHg Recommendations: No further ischemic workup   ECHO TEE  Result Date: 12/23/2022    TRANSESOPHOGEAL ECHO REPORT   Patient Name:    Jeffrey [name] Date of Exam: 12/23/2022 Medical Rec #:  [id number]         Height:       73.0 in Accession #:    [id number]        Weight:       245.8 lb Date of Birth:  Aug 21, 1965         BSA:          2.349 m Patient Age:    57 years          BP:           108/71 mmHg Patient Gender: M                 HR:           73 bpm. Exam Location:  Inpatient Procedure: Transesophageal Echo and Limited Color Doppler Indications:     Bacteremia  History:         Patient has prior history of Echocardiogram examinations, most                  recent 12/19/2022. Pacemaker; Risk Factors:h/o substance abuse.  Sonographer:     Thurman Coyer RDCS Referring Phys:  [id number] [facility]  Diagnosing Phys: [place] PROCEDURE: The transesophogeal probe was passed without difficulty through the esophogus of the patient. Sedation performed by different physician. The patient was monitored while under deep sedation. Anesthestetic sedation was provided intravenously by Anesthesiology: 300mg  of Propofol, 100mg  of Lidocaine. The patient developed no complications during the procedure.  IMPRESSIONS  1. Left ventricular ejection fraction, by estimation, is 60 to 65%. The left ventricle has normal function.  2. Right ventricular systolic function is normal. The right ventricular size is normal.  3. No left atrial/left atrial appendage thrombus was detected.  4. The mitral valve is normal in structure. Trivial mitral valve regurgitation.  5. The aortic valve is normal in structure. Aortic valve regurgitation is not visualized.  6. There is mild (Grade II) plaque. Conclusion(s)/Recommendation(s): Small strand coming from the right atrial lead, can be fibrinous materal (slide 33) (if bacteremia is persistent, can consider repeat study). FINDINGS  Left Ventricle: Left ventricular ejection fraction, by estimation, is 60 to 65%. The left ventricle has normal function. The left ventricular internal cavity size was normal in size. Right Ventricle:  The right ventricular size is normal. Right ventricular systolic function is normal. Left Atrium: No left atrial/left atrial appendage thrombus was detected. Pericardium: There is no evidence of pericardial effusion. Mitral Valve: The mitral valve is normal in structure. Trivial mitral valve regurgitation. Tricuspid Valve: The tricuspid valve is normal in structure. Tricuspid valve regurgitation is mild. Aortic Valve: The aortic valve is normal in structure. Aortic valve regurgitation is not visualized. Pulmonic Valve: The pulmonic valve was normal in structure. Pulmonic valve regurgitation is not visualized. Aorta: The aortic root is normal in size and structure. There is mild (Grade II) plaque. IAS/Shunts: No atrial level shunt detected by color flow Doppler. Additional Comments: A device lead is visualized. Carolan Clines Electronically signed by Carolan Clines Signature Date/Time: 12/23/2022/3:42:06 PM    Final    EP STUDY  Result Date: 12/23/2022 See surgical note for result.       Scheduled Meds:  amLODipine  5 mg Oral Daily   atenolol  50 mg Oral Daily   Chlorhexidine Gluconate Cloth  6 each Topical Daily   citalopram  40 mg Oral Daily   clonazePAM  1 mg Oral BID   colchicine  0.6 mg Oral Daily   [START ON 12/26/2022] enoxaparin (LOVENOX) injection  40 mg Subcutaneous Daily   insulin aspart  0-15 Units Subcutaneous TID WC   insulin aspart  0-5 Units Subcutaneous QHS   [START ON 12/26/2022] insulin glargine-yfgn  8 Units Subcutaneous Daily   metFORMIN  500 mg Oral BID WC   methadone  150 mg Oral Daily   sodium chloride flush  10-40 mL Intracatheter Q12H   sodium chloride flush  3 mL Intravenous Q12H   Continuous Infusions:  sodium chloride Stopped (12/18/22 1346)   sodium chloride     sodium chloride Stopped (12/19/22 1510)   sodium chloride     sodium chloride      ceFAZolin (ANCEF) IV 2 g (12/25/22 0142)     LOS: 7 days    Time spent: 45 minutes spent on chart review, discussion  with nursing staff, consultants, updating family and interview/physical exam; more than 50% of that time was spent in counseling and/or coordination of care.    Joseph Art, DO [facility] Available via Epic secure chat 7am-7pm After these hours, please refer to coverage provider listed on [email or web address] 12/25/2022, 11:20 AM

## 2022-12-26 ENCOUNTER — Ambulatory Visit: Payer: Medicaid Other | Admitting: Podiatry

## 2022-12-26 ENCOUNTER — Encounter (HOSPITAL_COMMUNITY): Payer: Self-pay | Admitting: Cardiovascular Disease

## 2022-12-26 DIAGNOSIS — R7881 Bacteremia: Secondary | ICD-10-CM | POA: Diagnosis not present

## 2022-12-26 DIAGNOSIS — A419 Sepsis, unspecified organism: Secondary | ICD-10-CM | POA: Diagnosis not present

## 2022-12-26 DIAGNOSIS — R6520 Severe sepsis without septic shock: Secondary | ICD-10-CM | POA: Diagnosis not present

## 2022-12-26 DIAGNOSIS — T827XXS Infection and inflammatory reaction due to other cardiac and vascular devices, implants and grafts, sequela: Secondary | ICD-10-CM

## 2022-12-26 DIAGNOSIS — T827XXD Infection and inflammatory reaction due to other cardiac and vascular devices, implants and grafts, subsequent encounter: Secondary | ICD-10-CM | POA: Diagnosis not present

## 2022-12-26 DIAGNOSIS — B9561 Methicillin susceptible Staphylococcus aureus infection as the cause of diseases classified elsewhere: Secondary | ICD-10-CM | POA: Diagnosis not present

## 2022-12-26 LAB — GLUCOSE, CAPILLARY
Glucose-Capillary: 107 mg/dL — ABNORMAL HIGH (ref 70–99)
Glucose-Capillary: 93 mg/dL (ref 70–99)
Glucose-Capillary: 67 mg/dL — ABNORMAL LOW (ref 70–99)
Glucose-Capillary: 94 mg/dL (ref 70–99)
Glucose-Capillary: 116 mg/dL — ABNORMAL HIGH (ref 70–99)

## 2022-12-26 LAB — CBC
HCT: 31.3 % — ABNORMAL LOW (ref 39.0–52.0)
Hemoglobin: 10 g/dL — ABNORMAL LOW (ref 13.0–17.0)
MCH: 28.2 pg (ref 26.0–34.0)
MCHC: 31.9 g/dL (ref 30.0–36.0)
MCV: 88.4 fL (ref 80.0–100.0)
Platelets: 135 10*3/uL — ABNORMAL LOW (ref 150–400)
RBC: 3.54 MIL/uL — ABNORMAL LOW (ref 4.22–5.81)
RDW: 13.6 % (ref 11.5–15.5)
WBC: 7.4 10*3/uL (ref 4.0–10.5)
nRBC: 0 % (ref 0.0–0.2)

## 2022-12-26 LAB — COMPREHENSIVE METABOLIC PANEL
ALT: 12 U/L (ref 0–44)
AST: 37 U/L (ref 15–41)
Albumin: 2.2 g/dL — ABNORMAL LOW (ref 3.5–5.0)
Alkaline Phosphatase: 85 U/L (ref 38–126)
Anion gap: 10 (ref 5–15)
BUN: 8 mg/dL (ref 6–20)
CO2: 24 mmol/L (ref 22–32)
Calcium: 7.1 mg/dL — ABNORMAL LOW (ref 8.9–10.3)
Chloride: 99 mmol/L (ref 98–111)
Creatinine, Ser: 0.88 mg/dL (ref 0.61–1.24)
GFR, Estimated: >60 mL/min (ref >60)
Glucose, Bld: 99 mg/dL (ref 70–99)
Potassium: 3.1 mmol/L — ABNORMAL LOW (ref 3.5–5.1)
Sodium: 133 mmol/L — ABNORMAL LOW (ref 135–145)
Total Bilirubin: 0.9 mg/dL (ref 0.3–1.2)
Total Protein: 6.1 g/dL — ABNORMAL LOW (ref 6.5–8.1)

## 2022-12-26 LAB — MAGNESIUM: Magnesium: 1.8 mg/dL (ref 1.7–2.4)

## 2022-12-26 MED ORDER — POTASSIUM CHLORIDE CRYS ER 20 MEQ PO TBCR
40.0000 meq | EXTENDED_RELEASE_TABLET | Freq: Once | ORAL | Status: AC
  Administered 2022-12-26: 40 meq via ORAL
  Filled 2022-12-26: qty 2

## 2022-12-26 MED ORDER — INSULIN ASPART 100 UNIT/ML IJ SOLN: 0.0000 [IU] | Freq: Three times a day (TID) | INTRAMUSCULAR | Status: DC

## 2022-12-26 MED ORDER — INSULIN ASPART 100 UNIT/ML IJ SOLN: 0.0000 [IU] | Freq: Every day | INTRAMUSCULAR | Status: DC

## 2022-12-26 MED ORDER — ATENOLOL 25 MG PO TABS
25.0000 mg | ORAL_TABLET | Freq: Every day | ORAL | Status: DC
  Administered 2022-12-26 – 2023-01-01 (×7): 25 mg via ORAL
  Filled 2022-12-26 (×9): qty 1

## 2022-12-26 NOTE — Progress Notes (Signed)
PROGRESS NOTE    Jeffrey [name]  ZOX:[id number] DOB: 03/15/1966 DOA: 12/18/2022 PCP: Tresa Garter, MD    Brief Narrative:  57 year old white male BMI 32 prior PTSD chronic BZD, opioid dependence previously on methadone.  Presented to the ED 9/25 fatigue nausea vomiting, SOB, Tmax 100.8 tachycardic slightly low blood pressure lactic acid 2.9-hypotension progressed to became septic started on Neo-Synephrine transferred to ICU.   BC ID MSSA. Tx to [facility] for TEE on 10/1.   Assessment and Plan: Sepsis secondary to infected pacemaker wires? 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   Weight as of this encounter: 107.9 kg.   Diabetes mellitus TY 2 D/c metformin -SSI and long acting   HTN -d/c norvasc   Chronic pain Methadone dependence Continue methadone 150 has been on this for many many years- - continue Celexa 40 daily  Hypokalemia -replete  DVT prophylaxis: enoxaparin (LOVENOX) injection 40 mg Start: 12/26/22 1000 SCDs Start: 12/18/22 0931    Code Status: Full Code   Disposition Plan:  Level of care: Telemetry Cardiac Status is: Inpatient Remains inpatient appropriate  Updated father 10/3  Consultants:  ID EP CVTS   Subjective: Knee feeling better  Objective: Vitals:   12/26/22 0406 12/26/22 0808 12/26/22 0938 12/26/22 1132  BP: 134/76 123/65 (!) 149/83 126/64  Pulse: 72 67 76 60  Resp: 16 18  18   Temp:  98.9 F (37.2 C) 98.3 F (36.8 C)  97.9 F (36.6 C)  TempSrc: Oral Oral  Oral  SpO2: 100% 97%  99%  Weight:      Height:        Intake/Output Summary (Last 24 hours) at 12/26/2022 1202 Last data filed at 12/26/2022 0500 Gross per 24 hour  Intake 1808.39 ml  Output 1450 ml  Net 358.39 ml    Filed Weights   12/18/22 1305 12/24/22 0341 12/25/22 0345  Weight: 111.5 kg 109.4 kg 107.9 kg    Examination:   General: Appearance:    Obese male in no acute distress     Lungs:      respirations unlabored  Heart:    Normal heart rate.    MS:   All extremities are intact.    Neurologic:   Awake, alert       Data Reviewed: I have personally reviewed following labs and imaging studies  CBC: Recent Labs  Lab 12/20/22 0553 12/23/22 0522 12/24/22 0346 12/25/22 0352 12/26/22 0539  WBC 8.8 7.6 8.0 12.5* 7.4  NEUTROABS  --  5.6  --   --   --   HGB 11.2* 10.7* 10.5* 11.9* 10.0*  HCT 33.4* 33.3* 32.8* 37.4* 31.3*  MCV 87.7 88.1 88.4 89.9 88.4  PLT 87* 100* 114* 166 135*   Basic Metabolic Panel: Recent Labs  Lab 12/20/22 0553 12/22/22 0931 12/23/22 0522 12/23/22 1116 12/24/22 0346 12/25/22 0352 12/26/22 0539  NA 131* 139 134* 136 137 135 133*  K 3.6 3.0* 3.7 3.3* 3.3* 3.6  3.1*  CL 102 104 101 102 104 102 99  CO2 22 28 26 24 26 23 24   GLUCOSE 252* 109* 165* 122* 83 98 99  BUN 22* 10 8 9 9 12 8   CREATININE 0.99 0.82 0.58* 0.76 1.06 1.09 0.88  CALCIUM 7.1* 7.2* 7.6* 7.8* 7.5* 7.6* 7.1*  MG 2.2 1.8 2.3  --   --   --   --   PHOS 1.8* 2.4* 3.3  --   --   --   --    GFR: Estimated Creatinine Clearance: 116.5 mL/min (by C-G formula based on SCr of 0.88 mg/dL). Liver Function Tests: Recent Labs  Lab 12/23/22 1116 12/24/22 0346 12/25/22 0352 12/26/22 0539  AST 55* 43* 46* 37  ALT 16 14 13 12   ALKPHOS 115 93 104 85  BILITOT 0.9 1.2 1.0 0.9  PROT 6.4* 5.7* 6.6 6.1*  ALBUMIN 2.6* 2.1* 2.4* 2.2*   No results for input(s): "LIPASE", "AMYLASE" in the last 168  hours.  No results for input(s): "AMMONIA" in the last 168 hours. Coagulation Profile: No results for input(s): "INR", "PROTIME" in the last 168 hours.  Cardiac Enzymes: No results for input(s): "CKTOTAL", "CKMB", "CKMBINDEX", "TROPONINI" in the last 168 hours. BNP (last 3 results) No results for input(s): "PROBNP" in the last 8760 hours. HbA1C: No results for input(s): "HGBA1C" in the last 72 hours. CBG: Recent Labs  Lab 12/25/22 1618 12/25/22 1709 12/25/22 2101 12/26/22 0805 12/26/22 1129  GLUCAP 60* 87 118* 107* 93   Lipid Profile: No results for input(s): "CHOL", "HDL", "LDLCALC", "TRIG", "CHOLHDL", "LDLDIRECT" in the last 72 hours. Thyroid Function Tests: No results for input(s): "TSH", "T4TOTAL", "FREET4", "T3FREE", "THYROIDAB" in the last 72 hours. Anemia Panel: No results for input(s): "VITAMINB12", "FOLATE", "FERRITIN", "TIBC", "IRON", "RETICCTPCT" in the last 72 hours. Sepsis Labs: No results for input(s): "PROCALCITON", "LATICACIDVEN" in the last 168 hours.   Recent Results (from the past 240 hour(s))  Resp panel by RT-PCR (RSV, Flu A&B, Covid) Anterior Nasal Swab     Status: None   Collection Time: 12/18/22  2:39 AM   Specimen: Anterior Nasal Swab  Result Value Ref Range Status   SARS Coronavirus 2 by RT PCR NEGATIVE NEGATIVE Final    Comment: (NOTE) SARS-CoV-2 target nucleic acids are NOT DETECTED.  The SARS-CoV-2 RNA is generally detectable in upper respiratory specimens during the acute phase of infection. The lowest concentration of SARS-CoV-2 viral copies this assay can detect is 138 copies/mL. A negative result does not preclude SARS-Cov-2 infection and should not be used as the sole basis for treatment or other patient management decisions. A negative result may occur with  improper specimen collection/handling, submission of specimen other than nasopharyngeal swab, presence of viral mutation(s) within the areas targeted by this assay, and  inadequate number of viral copies(<138 copies/mL). A negative result must be combined with clinical observations, patient history, and epidemiological information. The expected result is Negative.  Fact Sheet for Patients:  BloggerCourse.com  Fact Sheet for Healthcare Providers:  SeriousBroker.it  This test is no t yet approved or cleared by the [place] FDA and  has been authorized for detection and/or diagnosis of SARS-CoV-2 by FDA under an Emergency Use Authorization (EUA). This EUA will remain  in effect (meaning this test can be used) for the duration of the COVID-19 declaration under Section 564(b)(1) of the Act, 21 U.S.C.section 360bbb-3(b)(1), unless the authorization is terminated  or revoked sooner.       Influenza A by  PCR NEGATIVE NEGATIVE Final   Influenza B by PCR NEGATIVE NEGATIVE Final    Comment: (NOTE) The Xpert Xpress SARS-CoV-2/FLU/RSV plus assay is intended as an aid in the diagnosis of influenza from Nasopharyngeal swab specimens and should not be used as a sole basis for treatment. Nasal washings and aspirates are unacceptable for Xpert Xpress SARS-CoV-2/FLU/RSV testing.  Fact Sheet for Patients: BloggerCourse.com  Fact Sheet for Healthcare Providers: SeriousBroker.it  This test is not yet approved or cleared by the [place] FDA and has been authorized for detection and/or diagnosis of SARS-CoV-2 by FDA under an Emergency Use Authorization (EUA). This EUA will remain in effect (meaning this test can be used) for the duration of the COVID-19 declaration under Section 564(b)(1) of the Act, 21 U.S.C. section 360bbb-3(b)(1), unless the authorization is terminated or revoked.     Resp Syncytial Virus by PCR NEGATIVE NEGATIVE Final    Comment: (NOTE) Fact Sheet for Patients: BloggerCourse.com  Fact Sheet for Healthcare  Providers: SeriousBroker.it  This test is not yet approved or cleared by the [place] FDA and has been authorized for detection and/or diagnosis of SARS-CoV-2 by FDA under an Emergency Use Authorization (EUA). This EUA will remain in effect (meaning this test can be used) for the duration of the COVID-19 declaration under Section 564(b)(1) of the Act, 21 U.S.C. section 360bbb-3(b)(1), unless the authorization is terminated or revoked.  Performed at [facility], [address], [place], [place] [address]   Culture, blood (Routine x 2)     Status: Abnormal   Collection Time: 12/18/22  2:41 AM   Specimen: BLOOD RIGHT FOREARM  Result Value Ref Range Status   Specimen Description   Final    BLOOD RIGHT FOREARM Performed at [facility], [address], [place], [place] [address]    Special Requests   Final    BOTTLES DRAWN AEROBIC AND ANAEROBIC Blood Culture results may not be optimal due to an excessive volume of blood received in culture bottles Performed at [facility], [address], [place], [place] [address]    Culture  Setup Time   Final    GRAM POSITIVE COCCI IN CLUSTERS IN BOTH AEROBIC AND ANAEROBIC BOTTLES CRITICAL VALUE NOTED.  VALUE IS CONSISTENT WITH PREVIOUSLY REPORTED AND CALLED VALUE.    Culture (A)  Final    STAPHYLOCOCCUS AUREUS SUSCEPTIBILITIES PERFORMED ON PREVIOUS CULTURE WITHIN THE LAST 5 DAYS. Performed at [facility], [address] [place], [place] [address]    Report Status 12/20/2022 FINAL  Final  Culture, blood (Routine x 2)     Status: Abnormal   Collection Time: 12/18/22  2:49 AM   Specimen: BLOOD LEFT FOREARM  Result Value Ref Range Status   Specimen Description   Final    BLOOD LEFT FOREARM Performed at [facility], [address] [place], [place] [address]    Special Requests   Final    BOTTLES DRAWN AEROBIC AND ANAEROBIC Blood Culture results may not  be optimal due to an excessive volume of blood received in culture bottles Performed at [facility], [address], [place], [place] [address]    Culture  Setup Time   Final    GRAM POSITIVE COCCI IN CLUSTERS IN BOTH AEROBIC AND ANAEROBIC BOTTLES CRITICAL RESULT CALLED TO, READ BACK BY AND VERIFIED WITH: PHARMD C. Clearance Coots [id number] @1714  FH Performed at [facility], [address], [place], [place] [address]  Culture STAPHYLOCOCCUS AUREUS (A)  Final   Report Status 12/20/2022 FINAL  Final   Organism ID, Bacteria STAPHYLOCOCCUS AUREUS  Final      Susceptibility   Staphylococcus aureus - MIC*    CIPROFLOXACIN >=8 RESISTANT Resistant     ERYTHROMYCIN >=8 RESISTANT Resistant     GENTAMICIN <=0.5 SENSITIVE Sensitive     OXACILLIN 0.5 SENSITIVE Sensitive     TETRACYCLINE <=1 SENSITIVE Sensitive     VANCOMYCIN <=0.5 SENSITIVE Sensitive     TRIMETH/SULFA >=320 RESISTANT Resistant     CLINDAMYCIN <=0.25 SENSITIVE Sensitive     RIFAMPIN <=0.5 SENSITIVE Sensitive     Inducible Clindamycin NEGATIVE Sensitive     LINEZOLID 2 SENSITIVE Sensitive     * STAPHYLOCOCCUS AUREUS  Blood Culture ID Panel (Reflexed)     Status: Abnormal   Collection Time: 12/18/22  2:49 AM  Result Value Ref Range Status   Enterococcus faecalis NOT DETECTED NOT DETECTED Final   Enterococcus Faecium NOT DETECTED NOT DETECTED Final   Listeria monocytogenes NOT DETECTED NOT DETECTED Final   Staphylococcus species DETECTED (A) NOT DETECTED Final    Comment: CRITICAL RESULT CALLED TO, READ BACK BY AND VERIFIED WITH: PHARMD C. SHADE [id number] @1714  FH    Staphylococcus aureus (BCID) DETECTED (A) NOT DETECTED Final    Comment: CRITICAL RESULT CALLED TO, READ BACK BY AND VERIFIED WITH: PHARMD C. SHADE [id number] @1714  FH    Staphylococcus epidermidis NOT DETECTED NOT DETECTED Final   Staphylococcus lugdunensis NOT DETECTED NOT DETECTED Final   Streptococcus species NOT DETECTED NOT DETECTED Final    Streptococcus agalactiae NOT DETECTED NOT DETECTED Final   Streptococcus pneumoniae NOT DETECTED NOT DETECTED Final   Streptococcus pyogenes NOT DETECTED NOT DETECTED Final   A.calcoaceticus-baumannii NOT DETECTED NOT DETECTED Final   Bacteroides fragilis NOT DETECTED NOT DETECTED Final   Enterobacterales NOT DETECTED NOT DETECTED Final   Enterobacter cloacae complex NOT DETECTED NOT DETECTED Final   Escherichia coli NOT DETECTED NOT DETECTED Final   Klebsiella aerogenes NOT DETECTED NOT DETECTED Final   Klebsiella oxytoca NOT DETECTED NOT DETECTED Final   Klebsiella pneumoniae NOT DETECTED NOT DETECTED Final   Proteus species NOT DETECTED NOT DETECTED Final   Salmonella species NOT DETECTED NOT DETECTED Final   Serratia marcescens NOT DETECTED NOT DETECTED Final   Haemophilus influenzae NOT DETECTED NOT DETECTED Final   Neisseria meningitidis NOT DETECTED NOT DETECTED Final   Pseudomonas aeruginosa NOT DETECTED NOT DETECTED Final   Stenotrophomonas maltophilia NOT DETECTED NOT DETECTED Final   Candida albicans NOT DETECTED NOT DETECTED Final   Candida auris NOT DETECTED NOT DETECTED Final   Candida glabrata NOT DETECTED NOT DETECTED Final   Candida krusei NOT DETECTED NOT DETECTED Final   Candida parapsilosis NOT DETECTED NOT DETECTED Final   Candida tropicalis NOT DETECTED NOT DETECTED Final   Cryptococcus neoformans/gattii NOT DETECTED NOT DETECTED Final   Meth resistant mecA/C and MREJ NOT DETECTED NOT DETECTED Final    Comment: Performed at [facility], [address], [place], [place] [address]  MRSA Next Gen by PCR, Nasal     Status: None   Collection Time: 12/18/22  1:01 PM   Specimen: Nasal Mucosa; Nasal Swab  Result Value Ref Range Status   MRSA by PCR Next Gen NOT DETECTED NOT DETECTED Final    Comment: (NOTE) The GeneXpert MRSA Assay (FDA approved for NASAL specimens only), is one component of a comprehensive MRSA colonization surveillance program. It is not  intended  to diagnose MRSA infection nor to guide or monitor treatment for MRSA infections. Test performance is not FDA approved in patients less than 34 years old. Performed at [facility], [address] [place], [place] [address]   Culture, blood (Routine X 2) w Reflex to ID Panel     Status: None   Collection Time: 12/19/22  3:33 AM   Specimen: BLOOD RIGHT HAND  Result Value Ref Range Status   Specimen Description   Final    BLOOD RIGHT HAND Performed at [facility], [address] [place], [place] [address]    Special Requests   Final    BOTTLES DRAWN AEROBIC AND ANAEROBIC Blood Culture adequate volume Performed at [facility], [address], [place], [place] [address]    Culture   Final    NO GROWTH 5 DAYS Performed at [facility], [address] [place], [place] [address]    Report Status 12/24/2022 FINAL  Final  Culture, blood (Routine X 2) w Reflex to ID Panel     Status: None   Collection Time: 12/19/22  3:33 AM   Specimen: BLOOD RIGHT FOREARM  Result Value Ref Range Status   Specimen Description   Final    BLOOD RIGHT FOREARM Performed at [facility], [address] [place], [place] [address]    Special Requests   Final    BOTTLES DRAWN AEROBIC AND ANAEROBIC Blood Culture adequate volume Performed at [facility], [address], [place], [place] [address]    Culture   Final    NO GROWTH 5 DAYS Performed at [facility], [address], [place], [place] [address]    Report Status 12/24/2022 FINAL  Final         Radiology Studies: CARDIAC CATHETERIZATION  Result Date: 12/25/2022 No angiographic evidence of CAD LVEDP 16 mmHg Recommendations: No further ischemic workup        Scheduled Meds:  atenolol  25 mg Oral Daily   Chlorhexidine Gluconate Cloth  6 each Topical Daily   citalopram  40 mg Oral Daily   clonazePAM  1 mg Oral BID   colchicine  0.6 mg Oral Daily    enoxaparin (LOVENOX) injection  40 mg Subcutaneous Daily   insulin aspart  0-15 Units Subcutaneous TID WC   insulin aspart  0-5 Units Subcutaneous QHS   insulin glargine-yfgn  8 Units Subcutaneous Daily   methadone  150 mg Oral Daily   sodium chloride flush  10-40 mL Intracatheter Q12H   sodium chloride flush  3 mL Intravenous Q12H   Continuous Infusions:  sodium chloride Stopped (12/18/22 1346)   sodium chloride     sodium chloride Stopped (12/19/22 1510)   sodium chloride 250 mL (12/25/22 1701)    ceFAZolin (ANCEF) IV 2 g (12/26/22 0953)     LOS: 8 days    Time spent: 45 minutes spent on chart review, discussion with nursing staff, consultants, updating family and interview/physical exam; more than 50% of that time was spent in counseling and/or coordination of care.    Joseph Art, DO [facility] Available via Epic secure chat 7am-7pm After these hours, please refer to coverage provider listed on [email or web address] 12/26/2022, 12:02 PM

## 2022-12-26 NOTE — Progress Notes (Signed)
[facility] night cross cover note:   I was notified by RN of the patient's potassium level this morning of 3.1.  I subsequently ordered potassium chloride 40 meq p.o. x 1 dose now.    Newton Pigg, DO Hospitalist

## 2022-12-26 NOTE — Progress Notes (Signed)
Inpatient Rehab Admissions Coordinator:   Note therapy thinking possible need for CIR and surgery planned 10/7.  Will f/u for post-surgical therapy assessment.    Estill Dooms, PT, DPT Admissions Coordinator [phone] 12/26/22  3:28 PM

## 2022-12-26 NOTE — Progress Notes (Signed)
Patient Name: Jeffrey [name] Date of Encounter: 12/26/2022  Primary Cardiologist: None Electrophysiologist: None  Interval Summary   The patient is doing well today.  At this time, the patient denies chest pain, shortness of breath, or any new concerns.  Vital Signs    Vitals:   12/25/22 1049 12/25/22 1419 12/25/22 1935 12/26/22 0406  BP: 108/69 112/67 120/62 134/76  Pulse: 61  66 72  Resp:  16 20 16   Temp:  98.4 F (36.9 C) 98.7 F (37.1 C) 98.9 F (37.2 C)  TempSrc:  Oral Oral Oral  SpO2: 94%  98% 100%  Weight:      Height:        Intake/Output Summary (Last 24 hours) at 12/26/2022 0840 Last data filed at 12/26/2022 0500 Gross per 24 hour  Intake 1808.39 ml  Output 1450 ml  Net 358.39 ml   Filed Weights   12/18/22 1305 12/24/22 0341 12/25/22 0345  Weight: 111.5 kg 109.4 kg 107.9 kg    Physical Exam    GEN- The patient is well appearing, alert and oriented x 3 today.   Lungs- Clear to ausculation bilaterally, normal work of breathing Cardiac- Regular rate and rhythm, no murmurs, rubs or gallops. Right radial LHC site clean/dry without hematoma GI- soft, NT, ND, + BS Extremities- no clubbing or cyanosis. No edema  Telemetry    SR 70's / AP 60 (personally reviewed)  Radiology/Studies: CT Chest/ABD/Pelvis 9/26 > no acute findings  CT R Knee 9/30 > questionable myositis  TEE 10/1 > LVEF 60-65%, small strand coming from the RA lead > fibrinous material vs vegetation  LHC 10/3 > negative for CAD     DEVICE HISTORY:  Abbott Dual Chamber > initial implant 1993 at [facility], generator change 09/25/2004, generator change 2019. Atrial lead MDT 5524, RV lead MDT 4058   MICRO: BCx2 9/26 > MSSA  BCx2 9/27 > negative  Hospital Course    Jeffrey [name] is a 57 y.o. male with a history of long QT s/p PPM at [facility] > 61yrs ago, opioid dependence, PTSD, substance abuse, poorly controlled DM with right foot ulceration (followed by Podiatry) who was admitted 9/26  with reports of fatigue, nausea, vomiting, SOB. He was found to have septic shock requiring vasopressors, AKI, & thrombocytopenia. CT chest/abd/pelvis was negative for acute findings. R knee CT was with questionable myositis. Blood cultures grew MSSA. The patient underwent TEE 10/1 which showed fibrinous material on RA lead. ID following for sepsis / CIED infection. LHC 10/3 negative for CAD.   Assessment & Plan    CIED  MSSA Bacteremia  TEE with fibrinous material on right atrial lead. 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 Leads >31 years old.  LHC negative for CAD. -ID following, on cefazolin  -planned for hybrid OR/EP system extraction on 10/7 0730 with epicardial pacemaker implant -NPO after MN on 10/7 -reduced LRL to 45 bpm to allow for assessment of pacing burden (also reduced atenolol dose to minimize bradycardia peri-operatively)   Long QT Syndrome  S/p dual PPM in 1993, Known loop in RV lead, unchanged in last 10 years.  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  Signed, Canary Brim, MSN, APRN, NP-C, AGACNP-BC [facility] - Electrophysiology  12/26/2022, 9:20 AM

## 2022-12-26 NOTE — Progress Notes (Signed)
Physical Therapy Treatment Patient Details Name: Jeffrey [name] MRN: [id number] DOB: June 10, 1965 Today's Date: 12/26/2022   History of Present Illness Patient is a 57 year old male who presented to the hospital on 9/25 with fevers and profound weakness. Patient was admitted with hypotension with pressors started, septic shock secondary to MSSA bacteremia, AKI. PMH: opioid dependence, diabetes, anxiety, PTSD.    PT Comments  Pt with much improved mobility today. Motivated to work towards independence. Per pt his removal of pacer is scheduled for Monday. Pt to have sternotomy so expect a decline in mobility and possibly a need for more intense post acute rehab after surgery.     If plan is discharge home, recommend the following: A little help with walking and/or transfers;A little help with bathing/dressing/bathroom;Assist for transportation;Assistance with cooking/housework   Can travel by Training and development officer (2 wheels)    Recommendations for Other Services       Precautions / Restrictions Precautions Precautions: Fall Precaution Comments: chronic rt knee problems/pain Restrictions Weight Bearing Restrictions: No     Mobility  Bed Mobility               General bed mobility comments: Pt up in chair    Transfers Overall transfer level: Needs assistance Equipment used: Rolling walker (2 wheels) Transfers: Sit to/from Stand Sit to Stand: Contact guard assist           General transfer comment: Assist for safety    Ambulation/Gait Ambulation/Gait assistance: Contact guard assist Gait Distance (Feet): 100 Feet Assistive device: Rolling walker (2 wheels) Gait Pattern/deviations: Step-through pattern, Decreased stride length, Antalgic, Trunk flexed Gait velocity: decr Gait velocity interpretation: 1.31 - 2.62 ft/sec, indicative of limited community ambulator   General Gait Details: Assis for safety and lines. Verbal  cues to stay closer to walker and stand more upright   Stairs             Wheelchair Mobility     Tilt Bed    Modified Rankin (Stroke Patients Only)       Balance Overall balance assessment: Needs assistance Sitting-balance support: No upper extremity supported, Feet supported Sitting balance-Leahy Scale: Fair     Standing balance support: Single extremity supported, Bilateral upper extremity supported, During functional activity Standing balance-Leahy Scale: Poor Standing balance comment: UE support                            Cognition Arousal: Alert Behavior During Therapy: WFL for tasks assessed/performed Overall Cognitive Status: Within Functional Limits for tasks assessed                                          Exercises      General Comments General comments (skin integrity, edema, etc.): VSS on RA      Pertinent Vitals/Pain Pain Assessment Pain Assessment: Faces Faces Pain Scale: Hurts a little bit Pain Location: R knee Pain Descriptors / Indicators: Aching Pain Intervention(s): Monitored during session, Repositioned    Home Living                          Prior Function            PT Goals (current goals can now be found in the care  plan section) Progress towards PT goals: Progressing toward goals    Frequency    Min 1X/week      PT Plan      Co-evaluation              AM-PAC PT "6 Clicks" Mobility   Outcome Measure  Help needed turning from your back to your side while in a flat bed without using bedrails?: A Little Help needed moving from lying on your back to sitting on the side of a flat bed without using bedrails?: A Little Help needed moving to and from a bed to a chair (including a wheelchair)?: A Little Help needed standing up from a chair using your arms (e.g., wheelchair or bedside chair)?: A Little Help needed to walk in hospital room?: A Little Help needed climbing 3-5  steps with a railing? : A Lot 6 Click Score: 17    End of Session Equipment Utilized During Treatment: Gait belt Activity Tolerance: Patient tolerated treatment well Patient left: in chair;with call bell/phone within reach;with chair alarm set   PT Visit Diagnosis: Difficulty in walking, not elsewhere classified (R26.2);Pain Pain - Right/Left: Right Pain - part of body: Knee     Time: 1028-1049 PT Time Calculation (min) (ACUTE ONLY): 21 min  Charges:    $Gait Training: 8-22 mins PT General Charges $$ ACUTE PT VISIT: 1 Visit                     [facility] PT Acute Rehabilitation Services Office [phone]    Angelina Ok [facility] 12/26/2022, 11:58 AM

## 2022-12-26 NOTE — Progress Notes (Incomplete)
PHARMACY CONSULT NOTE FOR:  OUTPATIENT  PARENTERAL ANTIBIOTIC THERAPY (OPAT)  Indication: MSSA ICD infection Regimen: Cefazolin 2g IV every 8 hours End date: *** (6 weeks from ICD extraction 10/7)  IV antibiotic discharge orders are pended. To discharging provider:  please sign these orders via discharge navigator,  Select New Orders & click on the button choice - Manage This Unsigned Work.     Thank you for allowing pharmacy to be a part of this patient's care.  Georgina Pillion, PharmD, BCPS, BCIDP Infectious Diseases Clinical Pharmacist 12/26/2022 2:13 PM   **Pharmacist phone directory can now be found on [email or web address] ([email or web address]).  Listed under [facility].

## 2022-12-26 NOTE — Progress Notes (Signed)
Occupational Therapy Treatment Patient Details Name: Jeffrey [name] MRN: [id number] DOB: 1966/02/15 Today's Date: 12/26/2022   History of present illness Patient is a 57 year old male who presented to the hospital on 9/25 with fevers and profound weakness. Patient was admitted with hypotension with pressors started, septic shock secondary to MSSA bacteremia, AKI. PMH: opioid dependence, diabetes, anxiety, PTSD.   OT comments  Patient making good gains with bed mobility, transfers, and self care. Patient able to stand from EOB to use urinal, ambulate to sink, and stand for UB bathing and grooming without seated rest break and no RLE knee buckling. Patient continues to be motivated towards therapy and increasing functional status. Patient will benefit from continued inpatient follow up therapy, <3 hours/day for continued OT to address bathing, dressing, and toilet transfers.       If plan is discharge home, recommend the following:  A little help with walking and/or transfers;Assistance with cooking/housework;Direct supervision/assist for medications management;Direct supervision/assist for financial management;Assist for transportation;Help with stairs or ramp for entrance;A little help with bathing/dressing/bathroom   Equipment Recommendations  Other (comment) (RW)    Recommendations for Other Services      Precautions / Restrictions Precautions Precautions: Fall Precaution Comments: bad RIGHT knee Restrictions Weight Bearing Restrictions: No (Simultaneous filing. User may not have seen previous data.)       Mobility Bed Mobility Overal bed mobility: Modified Independent (HOB elevated) Bed Mobility: Supine to Sit     Supine to sit: Modified independent (Device/Increase time), HOB elevated     General bed mobility comments: no assistance to get to EOB    Transfers Overall transfer level: Needs assistance Equipment used: Rolling walker (2 wheels) Transfers: Sit to/from  Stand Sit to Stand: Contact guard assist     Step pivot transfers: Contact guard assist     General transfer comment: CGA for safety due to painful Right knee     Balance Overall balance assessment: Needs assistance Sitting-balance support: No upper extremity supported, Feet supported Sitting balance-Leahy Scale: Fair     Standing balance support: Single extremity supported, Bilateral upper extremity supported, During functional activity Standing balance-Leahy Scale: Poor Standing balance comment: single extremity support for static standing at sink                           ADL either performed or assessed with clinical judgement   ADL Overall ADL's : Needs assistance/impaired     Grooming: Wash/dry hands;Wash/dry face;Oral care;Brushing hair;Contact guard assist;Standing Grooming Details (indicate cue type and reason): CGA for safety due to right knee Upper Body Bathing: Contact guard assist;Standing Upper Body Bathing Details (indicate cue type and reason): at sink     Upper Body Dressing : Minimal assistance;Sitting Upper Body Dressing Details (indicate cue type and reason): to donn gown                   General ADL Comments: able to stand at sink on this date and perform bathing and grooming tasks    Extremity/Trunk Assessment              Vision       Perception     Praxis      Cognition Arousal: Alert Behavior During Therapy: [facility] for tasks assessed/performed Overall Cognitive Status: Within Functional Limits for tasks assessed  General Comments: pleasant and motivated to participate        Exercises      Shoulder Instructions       General Comments      Pertinent Vitals/ Pain       Pain Assessment Pain Assessment: Faces Faces Pain Scale: Hurts little more Pain Location: R knee Pain Descriptors / Indicators: Aching Pain Intervention(s): Monitored during session,  Repositioned  Home Living                                          Prior Functioning/Environment              Frequency  Min 1X/week        Progress Toward Goals  OT Goals(current goals can now be found in the care plan section)  Progress towards OT goals: Progressing toward goals  Acute Rehab OT Goals Patient Stated Goal: go home OT Goal Formulation: With patient Time For Goal Achievement: 01/04/23 Potential to Achieve Goals: Fair ADL Goals Pt Will Perform Lower Body Dressing: with supervision;sitting/lateral leans;with adaptive equipment Pt Will Transfer to Toilet: with supervision;ambulating;regular height toilet Pt Will Perform Toileting - Clothing Manipulation and hygiene: with supervision;sitting/lateral leans  Plan      Co-evaluation                 AM-PAC OT "6 Clicks" Daily Activity     Outcome Measure   Help from another person eating meals?: None Help from another person taking care of personal grooming?: A Little Help from another person toileting, which includes using toliet, bedpan, or urinal?: A Little Help from another person bathing (including washing, rinsing, drying)?: A Lot Help from another person to put on and taking off regular upper body clothing?: A Little Help from another person to put on and taking off regular lower body clothing?: A Lot 6 Click Score: 17    End of Session Equipment Utilized During Treatment: Gait belt;Rolling walker (2 wheels)  OT Visit Diagnosis: Unsteadiness on feet (R26.81);Other abnormalities of gait and mobility (R26.89);Muscle weakness (generalized) (M62.81);Pain Pain - Right/Left: Right Pain - part of body: Knee   Activity Tolerance Patient tolerated treatment well   Patient Left in chair;with call bell/phone within reach;with chair alarm set   Nurse Communication Mobility status        Time: [id number] OT Time Calculation (min): 25 min  Charges: OT General Charges $OT  Visit: 1 Visit OT Treatments $Self Care/Home Management : 23-37 mins  Alfonse Flavors, OTA Acute Rehabilitation Services  Office [phone]   Dewain Penning 12/26/2022, 10:52 AM

## 2022-12-26 NOTE — Progress Notes (Signed)
[facility]  Date of Admission:  12/18/2022      Total days of antibiotics 8   Cefazolin 9/26 >> c          ASSESSMENT: Jeffrey [name] is a 57 y.o. 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 Thought to be d/t trauma not infection (see below)     PPM Lead Vegetation -  -plan for extraction 10/7 with Dr. Laneta Simmers / [employer], will need more complicated approach given this system originally placed a long time ago.  -intermittently a-pacing on tele but frequently overrides -LHC negative  -suspect he will need replacement system?    Right Knee - -Rt knee assessed by ortho team and no concern for septic arthritis, CT scan also unrevealing aside from some swelling in surrounding musculature.  -May be related to fall shortly PTA? -Continues to improve - follow clinically    Right Foot Wound Plantar Wound -  -wounds on Rt foot he has been working with Dr. Ardelle Anton with outpatient which he describe to wax and wane with regards to infection. Seems that they have all healed over. On exam he has hemorrhagic blister on the plantar aspect of metartarsal head of  #1/2. Non painful.   H/O Long QTc    PLAN: Continue cefazolin  OR plans 10/7 Watch Rt knee    Dr. Renold Don is available over the weekend for ID related questions - otherwise we will see him back next week.   Principal Problem:   Sepsis (HCC) Active Problems:   Shock (HCC)   Hypovolemia   Bacteremia   Pacemaker infection (HCC)   Acute pain of right knee    atenolol  25 mg Oral Daily   Chlorhexidine Gluconate Cloth  6 each Topical Daily   citalopram  40 mg Oral Daily   clonazePAM  1 mg  Oral BID   colchicine  0.6 mg Oral Daily   enoxaparin (LOVENOX) injection  40 mg Subcutaneous Daily   insulin aspart  0-5 Units Subcutaneous QHS   insulin aspart  0-6 Units Subcutaneous TID WC   insulin glargine-yfgn  8 Units Subcutaneous Daily   methadone  150 mg Oral Daily   sodium chloride flush  10-40 mL Intracatheter Q12H   sodium chloride flush  3 mL Intravenous Q12H    SUBJECTIVE: Right knee continues to improve - walked in the hall today   Review of Systems: Review of Systems  Constitutional:  Negative for chills and fever.  Respiratory:  Negative for cough, sputum production and shortness of breath.   Cardiovascular:  Negative for chest pain.  Genitourinary: Negative.   Musculoskeletal:  Positive for joint pain (improving Rt knee).  Neurological: Negative.     Allergies  Allergen Reactions   Codeine Itching   Penicillins Hives    Has patient had a PCN reaction causing immediate rash, facial/tongue/throat swelling, SOB or lightheadedness with hypotension: unknown Has patient had a PCN reaction causing severe rash involving mucus membranes or skin necrosis: unknown Has patient had a PCN reaction that required hospitalization unknown Has patient had a PCN reaction occurring within the last 10 years: No If all of the above  answers are "NO", then may proceed with Cephalosporin use.     OBJECTIVE: Vitals:   12/26/22 0406 12/26/22 0808 12/26/22 0938 12/26/22 1132  BP: 134/76 123/65 (!) 149/83 126/64  Pulse: 72 67 76 60  Resp: 16 18  18   Temp: 98.9 F (37.2 C) 98.3 F (36.8 C)  97.9 F (36.6 C)  TempSrc: Oral Oral  Oral  SpO2: 100% 97%  99%  Weight:      Height:       Body mass index is 31.37 kg/m.  Physical Exam Vitals reviewed.  Constitutional:      Appearance: He is well-developed.     Comments: Seated comfortably in chair during visit.   HENT:     Mouth/Throat:     Dentition: Normal dentition. No dental abscesses.  Neck:     Comments: Rt neck CVC  clean/dry  Cardiovascular:     Rate and Rhythm: Normal rate and regular rhythm.     Heart sounds: Normal heart sounds.  Pulmonary:     Effort: Pulmonary effort is normal.     Breath sounds: Normal breath sounds.  Abdominal:     General: There is no distension.     Palpations: Abdomen is soft.     Tenderness: There is no abdominal tenderness.  Musculoskeletal:        General: No swelling. Tenderness: sore Rt knee. Lymphadenopathy:     Cervical: No cervical adenopathy.  Skin:    General: Skin is warm and dry.     Findings: No rash.  Neurological:     Mental Status: He is alert and oriented to person, place, and time.  Psychiatric:        Judgment: Judgment normal.     Lab Results Lab Results  Component Value Date   WBC 7.4 12/26/2022   HGB 10.0 (L) 12/26/2022   HCT 31.3 (L) 12/26/2022   MCV 88.4 12/26/2022   PLT 135 (L) 12/26/2022    Lab Results  Component Value Date   CREATININE 0.88 12/26/2022   BUN 8 12/26/2022   NA 133 (L) 12/26/2022   K 3.1 (L) 12/26/2022   CL 99 12/26/2022   CO2 24 12/26/2022    Lab Results  Component Value Date   ALT 12 12/26/2022   AST 37 12/26/2022   ALKPHOS 85 12/26/2022   BILITOT 0.9 12/26/2022     Microbiology: Recent Results (from the past 240 hour(s))  Resp panel by RT-PCR (RSV, Flu A&B, Covid) Anterior Nasal Swab     Status: None   Collection Time: 12/18/22  2:39 AM   Specimen: Anterior Nasal Swab  Result Value Ref Range Status   SARS Coronavirus 2 by RT PCR NEGATIVE NEGATIVE Final    Comment: (NOTE) SARS-CoV-2 target nucleic acids are NOT DETECTED.  The SARS-CoV-2 RNA is generally detectable in upper respiratory specimens during the acute phase of infection. The lowest concentration of SARS-CoV-2 viral copies this assay can detect is 138 copies/mL. A negative result does not preclude SARS-Cov-2 infection and should not be used as the sole basis for treatment or other patient management decisions. A negative result may  occur with  improper specimen collection/handling, submission of specimen other than nasopharyngeal swab, presence of viral mutation(s) within the areas targeted by this assay, and inadequate number of viral copies(<138 copies/mL). A negative result must be combined with clinical observations, patient history, and epidemiological information. The expected result is Negative.  Fact Sheet for Patients:  BloggerCourse.com  Fact Sheet for Healthcare Providers:  SeriousBroker.it  This test is no t yet approved or cleared by the [place] and  has been authorized for detection and/or diagnosis of SARS-CoV-2 by FDA under an Emergency Use Authorization (EUA). This EUA will remain  in effect (meaning this test can be used) for the duration of the COVID-19 declaration under Section 564(b)(1) of the Act, 21 U.S.C.section 360bbb-3(b)(1), unless the authorization is terminated  or revoked sooner.       Influenza A by PCR NEGATIVE NEGATIVE Final   Influenza B by PCR NEGATIVE NEGATIVE Final    Comment: (NOTE) The Xpert Xpress SARS-CoV-2/FLU/RSV plus assay is intended as an aid in the diagnosis of influenza from Nasopharyngeal swab specimens and should not be used as a sole basis for treatment. Nasal washings and aspirates are unacceptable for Xpert Xpress SARS-CoV-2/FLU/RSV testing.  Fact Sheet for Patients: BloggerCourse.com  Fact Sheet for Healthcare Providers: SeriousBroker.it  This test is not yet approved or cleared by the [place] FDA and has been authorized for detection and/or diagnosis of SARS-CoV-2 by FDA under an Emergency Use Authorization (EUA). This EUA will remain in effect (meaning this test can be used) for the duration of the COVID-19 declaration under Section 564(b)(1) of the Act, 21 U.S.C. section 360bbb-3(b)(1), unless the authorization is terminated  or revoked.     Resp Syncytial Virus by PCR NEGATIVE NEGATIVE Final    Comment: (NOTE) Fact Sheet for Patients: BloggerCourse.com  Fact Sheet for Healthcare Providers: SeriousBroker.it  This test is not yet approved or cleared by the [place] FDA and has been authorized for detection and/or diagnosis of SARS-CoV-2 by FDA under an Emergency Use Authorization (EUA). This EUA will remain in effect (meaning this test can be used) for the duration of the COVID-19 declaration under Section 564(b)(1) of the Act, 21 U.S.C. section 360bbb-3(b)(1), unless the authorization is terminated or revoked.  Performed at [facility], [address] [place], [place] [address]   Culture, blood (Routine x 2)     Status: Abnormal   Collection Time: 12/18/22  2:41 AM   Specimen: BLOOD RIGHT FOREARM  Result Value Ref Range Status   Specimen Description   Final    BLOOD RIGHT FOREARM Performed at [facility], [address] [place], [place] [address]    Special Requests   Final    BOTTLES DRAWN AEROBIC AND ANAEROBIC Blood Culture results may not be optimal due to an excessive volume of blood received in culture bottles Performed at [facility], [address] [place], [place] [address]    Culture  Setup Time   Final    GRAM POSITIVE COCCI IN CLUSTERS IN BOTH AEROBIC AND ANAEROBIC BOTTLES CRITICAL VALUE NOTED.  VALUE IS CONSISTENT WITH PREVIOUSLY REPORTED AND CALLED VALUE.    Culture (A)  Final    STAPHYLOCOCCUS AUREUS SUSCEPTIBILITIES PERFORMED ON PREVIOUS CULTURE WITHIN THE LAST 5 DAYS. Performed at [facility], [address], [place], [place] [address]    Report Status 12/20/2022 FINAL  Final  Culture, blood (Routine x 2)     Status: Abnormal   Collection Time: 12/18/22  2:49 AM   Specimen: BLOOD LEFT FOREARM  Result Value Ref Range Status   Specimen Description   Final    BLOOD  LEFT FOREARM Performed at [facility], [address] [place], [place] [address]    Special Requests   Final    BOTTLES DRAWN AEROBIC AND ANAEROBIC Blood Culture results  may not be optimal due to an excessive volume of blood received in culture bottles Performed at [facility], [address] [place], [place] [address]    Culture  Setup Time   Final    GRAM POSITIVE COCCI IN CLUSTERS IN BOTH AEROBIC AND ANAEROBIC BOTTLES CRITICAL RESULT CALLED TO, READ BACK BY AND VERIFIED WITH: PHARMD C. Clearance Coots [id number] @1714  FH Performed at [facility], [address], [place], [place] [address]    Culture STAPHYLOCOCCUS AUREUS (A)  Final   Report Status 12/20/2022 FINAL  Final   Organism ID, Bacteria STAPHYLOCOCCUS AUREUS  Final      Susceptibility   Staphylococcus aureus - MIC*    CIPROFLOXACIN >=8 RESISTANT Resistant     ERYTHROMYCIN >=8 RESISTANT Resistant     GENTAMICIN <=0.5 SENSITIVE Sensitive     OXACILLIN 0.5 SENSITIVE Sensitive     TETRACYCLINE <=1 SENSITIVE Sensitive     VANCOMYCIN <=0.5 SENSITIVE Sensitive     TRIMETH/SULFA >=320 RESISTANT Resistant     CLINDAMYCIN <=0.25 SENSITIVE Sensitive     RIFAMPIN <=0.5 SENSITIVE Sensitive     Inducible Clindamycin NEGATIVE Sensitive     LINEZOLID 2 SENSITIVE Sensitive     * STAPHYLOCOCCUS AUREUS  Blood Culture ID Panel (Reflexed)     Status: Abnormal   Collection Time: 12/18/22  2:49 AM  Result Value Ref Range Status   Enterococcus faecalis NOT DETECTED NOT DETECTED Final   Enterococcus Faecium NOT DETECTED NOT DETECTED Final   Listeria monocytogenes NOT DETECTED NOT DETECTED Final   Staphylococcus species DETECTED (A) NOT DETECTED Final    Comment: CRITICAL RESULT CALLED TO, READ BACK BY AND VERIFIED WITH: PHARMD C. SHADE [id number] @1714  FH    Staphylococcus aureus (BCID) DETECTED (A) NOT DETECTED Final    Comment: CRITICAL RESULT CALLED TO, READ BACK BY AND VERIFIED WITH: PHARMD C. SHADE [id number] @1714  FH     Staphylococcus epidermidis NOT DETECTED NOT DETECTED Final   Staphylococcus lugdunensis NOT DETECTED NOT DETECTED Final   Streptococcus species NOT DETECTED NOT DETECTED Final   Streptococcus agalactiae NOT DETECTED NOT DETECTED Final   Streptococcus pneumoniae NOT DETECTED NOT DETECTED Final   Streptococcus pyogenes NOT DETECTED NOT DETECTED Final   A.calcoaceticus-baumannii NOT DETECTED NOT DETECTED Final   Bacteroides fragilis NOT DETECTED NOT DETECTED Final   Enterobacterales NOT DETECTED NOT DETECTED Final   Enterobacter cloacae complex NOT DETECTED NOT DETECTED Final   Escherichia coli NOT DETECTED NOT DETECTED Final   Klebsiella aerogenes NOT DETECTED NOT DETECTED Final   Klebsiella oxytoca NOT DETECTED NOT DETECTED Final   Klebsiella pneumoniae NOT DETECTED NOT DETECTED Final   Proteus species NOT DETECTED NOT DETECTED Final   Salmonella species NOT DETECTED NOT DETECTED Final   Serratia marcescens NOT DETECTED NOT DETECTED Final   Haemophilus influenzae NOT DETECTED NOT DETECTED Final   Neisseria meningitidis NOT DETECTED NOT DETECTED Final   Pseudomonas aeruginosa NOT DETECTED NOT DETECTED Final   Stenotrophomonas maltophilia NOT DETECTED NOT DETECTED Final   Candida albicans NOT DETECTED NOT DETECTED Final   Candida auris NOT DETECTED NOT DETECTED Final   Candida glabrata NOT DETECTED NOT DETECTED Final   Candida krusei NOT DETECTED NOT DETECTED Final   Candida parapsilosis NOT DETECTED NOT DETECTED Final   Candida tropicalis NOT DETECTED NOT DETECTED Final   Cryptococcus neoformans/gattii NOT DETECTED NOT DETECTED Final   Meth resistant mecA/C and MREJ NOT DETECTED NOT DETECTED Final    Comment: Performed at [facility], [address] [place], [place] [address]  MRSA Next Gen by PCR, Nasal     Status: None   Collection Time: 12/18/22  1:01 PM   Specimen: Nasal Mucosa; Nasal Swab  Result Value Ref Range Status   MRSA by PCR Next Gen NOT DETECTED NOT DETECTED  Final    Comment: (NOTE) The GeneXpert MRSA Assay (FDA approved for NASAL specimens only), is one component of a comprehensive MRSA colonization surveillance program. It is not intended to diagnose MRSA infection nor to guide or monitor treatment for MRSA infections. Test performance is not FDA approved in patients less than 63 years old. Performed at [facility], [address], [place], [place] [address]   Culture, blood (Routine X 2) w Reflex to ID Panel     Status: None   Collection Time: 12/19/22  3:33 AM   Specimen: BLOOD RIGHT HAND  Result Value Ref Range Status   Specimen Description   Final    BLOOD RIGHT HAND Performed at [facility], [address], [place], [place] [address]    Special Requests   Final    BOTTLES DRAWN AEROBIC AND ANAEROBIC Blood Culture adequate volume Performed at [facility], [address] [place], [place] [address]    Culture   Final    NO GROWTH 5 DAYS Performed at [facility], [address], [place], [place] [address]    Report Status 12/24/2022 FINAL  Final  Culture, blood (Routine X 2) w Reflex to ID Panel     Status: None   Collection Time: 12/19/22  3:33 AM   Specimen: BLOOD RIGHT FOREARM  Result Value Ref Range Status   Specimen Description   Final    BLOOD RIGHT FOREARM Performed at [facility], [address] [place], [place] [address]    Special Requests   Final    BOTTLES DRAWN AEROBIC AND ANAEROBIC Blood Culture adequate volume Performed at [facility], [address] [place], [place] [address]    Culture   Final    NO GROWTH 5 DAYS Performed at [facility], [address], [place], [place] [address]    Report Status 12/24/2022 FINAL  Final     Rexene Alberts, MSN, NP-C [facility]  [email or web address] Pager: [phone] Office: [phone] [facility] Main Line:  [phone] *Secure Chat Communication Welcome

## 2022-12-26 NOTE — Progress Notes (Signed)
Mobility Specialist Progress Note:   12/26/22 1500  Mobility  Activity Ambulated with assistance in hallway  Level of Assistance +2 (takes two people) (chair follow)  Assistive Device Front wheel walker  Distance Ambulated (ft) 180 ft  Activity Response Tolerated well  Mobility Referral Yes  $Mobility charge 1 Mobility  Mobility Specialist Start Time (ACUTE ONLY) 1533  Mobility Specialist Stop Time (ACUTE ONLY) 1544  Mobility Specialist Time Calculation (min) (ACUTE ONLY) 11 min   Received pt in bed having no complaints and agreeable to mobility. Pt was asymptomatic throughout ambulation w/ no complaints. Student-RN assisted in chair follow. Wheeled back to room in chair w/o fault. Left in bed w/ call bell in reach and all needs met.   D'Vante Earlene Plater Mobility Specialist Please contact via Special educational needs teacher or Rehab office at [phone]

## 2022-12-26 NOTE — Inpatient Diabetes Management (Signed)
Inpatient Diabetes Program Recommendations  AACE/ADA: New Consensus Statement on Inpatient Glycemic Control (2015)  Target Ranges:  Prepandial:   less than 140 mg/dL      Peak postprandial:   less than 180 mg/dL (1-2 hours)      Critically ill patients:  140 - 180 mg/dL   Lab Results  Component Value Date   GLUCAP 107 (H) 12/26/2022   HGBA1C 11.6 (H) 12/18/2022    Latest Reference Range & Units 12/25/22 07:46 12/25/22 12:15 12/25/22 16:18 12/25/22 17:09 12/25/22 21:01 12/26/22 08:05  Glucose-Capillary 70 - 99 mg/dL 82 82 60 (L) 87 161 (H) 107 (H)  (L): Data is abnormally low (H): Data is abnormally high  Inpatient Diabetes Program Recommendations:   Please consider: -Decrease Novolog correction to 0-6 tid, 0-5 hs.  Thank you, Jeffrey [name]. Hafiz Irion, RN, MSN, CDE  Diabetes Coordinator Inpatient Glycemic Control Team Team Pager [phone] (8am-5pm) 12/26/2022 11:30 AM

## 2022-12-27 ENCOUNTER — Inpatient Hospital Stay (HOSPITAL_COMMUNITY): Payer: MEDICAID

## 2022-12-27 DIAGNOSIS — R7881 Bacteremia: Secondary | ICD-10-CM | POA: Diagnosis not present

## 2022-12-27 DIAGNOSIS — T827XXD Infection and inflammatory reaction due to other cardiac and vascular devices, implants and grafts, subsequent encounter: Secondary | ICD-10-CM | POA: Diagnosis not present

## 2022-12-27 LAB — BASIC METABOLIC PANEL
Anion gap: 11 (ref 5–15)
BUN: 7 mg/dL (ref 6–20)
CO2: 24 mmol/L (ref 22–32)
Calcium: 7.3 mg/dL — ABNORMAL LOW (ref 8.9–10.3)
Chloride: 100 mmol/L (ref 98–111)
Creatinine, Ser: 1.01 mg/dL (ref 0.61–1.24)
GFR, Estimated: >60 mL/min (ref >60)
Glucose, Bld: 86 mg/dL (ref 70–99)
Potassium: 3.4 mmol/L — ABNORMAL LOW (ref 3.5–5.1)
Sodium: 135 mmol/L (ref 135–145)

## 2022-12-27 LAB — CBC
HCT: 31.5 % — ABNORMAL LOW (ref 39.0–52.0)
Hemoglobin: 10 g/dL — ABNORMAL LOW (ref 13.0–17.0)
MCH: 28.1 pg (ref 26.0–34.0)
MCHC: 31.7 g/dL (ref 30.0–36.0)
MCV: 88.5 fL (ref 80.0–100.0)
Platelets: 154 10*3/uL (ref 150–400)
RBC: 3.56 MIL/uL — ABNORMAL LOW (ref 4.22–5.81)
RDW: 13.7 % (ref 11.5–15.5)
WBC: 6.5 10*3/uL (ref 4.0–10.5)
nRBC: 0 % (ref 0.0–0.2)

## 2022-12-27 LAB — GLUCOSE, CAPILLARY
Glucose-Capillary: 74 mg/dL (ref 70–99)
Glucose-Capillary: 88 mg/dL (ref 70–99)
Glucose-Capillary: 89 mg/dL (ref 70–99)
Glucose-Capillary: 65 mg/dL — ABNORMAL LOW (ref 70–99)
Glucose-Capillary: 115 mg/dL — ABNORMAL HIGH (ref 70–99)

## 2022-12-27 MED ORDER — INSULIN GLARGINE-YFGN 100 UNIT/ML ~~LOC~~ SOLN
5.0000 [IU] | Freq: Every day | SUBCUTANEOUS | Status: DC
  Administered 2022-12-27: 5 [IU] via SUBCUTANEOUS
  Filled 2022-12-27 (×2): qty 0.05

## 2022-12-27 MED ORDER — MAGNESIUM SULFATE 2 GM/50ML IV SOLN
2.0000 g | Freq: Once | INTRAVENOUS | Status: AC
  Administered 2022-12-27: 2 g via INTRAVENOUS
  Filled 2022-12-27: qty 50

## 2022-12-27 MED ORDER — POTASSIUM CHLORIDE CRYS ER 20 MEQ PO TBCR
40.0000 meq | EXTENDED_RELEASE_TABLET | Freq: Once | ORAL | Status: AC
  Administered 2022-12-27: 40 meq via ORAL
  Filled 2022-12-27: qty 2

## 2022-12-27 NOTE — Progress Notes (Signed)
PROGRESS NOTE    Jeffrey [name]  NWG:[id number] DOB: Aug 02, 1965 DOA: 12/18/2022 PCP: Tresa Garter, MD    Brief Narrative:  57 year old white male BMI 32 prior PTSD chronic BZD, opioid dependence previously on methadone.  Presented to the ED 9/25 fatigue nausea vomiting, SOB, Tmax 100.8 tachycardic slightly low blood pressure lactic acid 2.9-hypotension progressed to became septic started on Neo-Synephrine transferred to ICU.   BC ID MSSA. Tx to [facility] for TEE on 10/1.   Assessment and Plan: Sepsis secondary to infected pacemaker wires? /Myositis based on CT scan of right knee Continuing abx per ID -ID consulted -s/p TEE on 10/1 -EP following -LHC: 10/3 Tentatively planned for OR 10/7 with Lambert/Bartle -ortho consult: no current need for arthrocentesis- improved on IV Abx   Acute thrombocytopenia on admission likely from mild DIC now resolving -trending up   AKI on admission with hyponatremia/hypokalemia and metabolic acidosis -resolved  Long QTc previous PPM PPM to be removed as per cardiology   Obesity Estimated body mass index is 31.37 kg/m as calculated from the following:   Height as of this encounter: 6\' 1"  (1.854 m).   Weight as of this encounter: 107.9 kg.   Diabetes mellitus TY 2 D/c metformin -SSI and long acting-- decreasing long acting daily-- may not need    HTN -d/c norvasc   Chronic pain Methadone dependence Continue methadone 150 has been on this for many many years- - continue Celexa 40 daily  Hypokalemia -replete  DVT prophylaxis: enoxaparin (LOVENOX) injection 40 mg Start: 12/26/22 1000 SCDs Start: 12/18/22 0931    Code Status: Full Code   Disposition Plan:  Level of care: Telemetry Cardiac Status is: Inpatient Remains inpatient appropriate  Updated father 10/3  Consultants:  ID EP CVTS   Subjective: Blood sugar was a little low this AM  Objective: Vitals:   12/26/22 1614 12/26/22 1941 12/26/22 2327 12/27/22 0327   BP: 109/61 113/72 117/64 121/63  Pulse: (!) 58 62 (!) 55 (!) 55  Resp: 18 16 16 19   Temp: 97.8 F (36.6 C) 98.1 F (36.7 C) 98 F (36.7 C) 98 F (36.7 C)  TempSrc: Oral Oral Oral Oral  SpO2: 96% 93% 95% 96%  Weight:      Height:        Intake/Output Summary (Last 24 hours) at 12/27/2022 1100 Last data filed at 12/27/2022 0545 Gross per 24 hour  Intake 2007.83 ml  Output 1750 ml  Net 257.83 ml    Filed Weights   12/18/22 1305 12/24/22 0341 12/25/22 0345  Weight: 111.5 kg 109.4 kg 107.9 kg    Examination:   General: Appearance:    Obese male in no acute distress     Lungs:      respirations unlabored  Heart:    Bradycardic.    MS:   All extremities are intact.    Neurologic:   Awake, alert       Data Reviewed: I have personally reviewed following labs and imaging studies  CBC: Recent Labs  Lab 12/23/22 0522 12/24/22 0346 12/25/22 0352 12/26/22 0539 12/27/22 0500  WBC 7.6 8.0 12.5* 7.4 6.5  NEUTROABS 5.6  --   --   --   --   HGB 10.7* 10.5* 11.9* 10.0* 10.0*  HCT 33.3* 32.8* 37.4* 31.3* 31.5*  MCV 88.1 88.4 89.9 88.4 88.5  PLT 100* 114* 166 135* 154   Basic Metabolic Panel: Recent Labs  Lab 12/22/22 0931 12/23/22 0522 12/23/22 1116 12/24/22 0346 12/25/22 0352 12/26/22  0500 12/26/22 0539 12/27/22 0500  NA 139 134* 136 137 135  --  133* 135  K 3.0* 3.7 3.3* 3.3* 3.6  --  3.1* 3.4*  CL 104 101 102 104 102  --  99 100  CO2 28 26 24 26 23   --  24 24  GLUCOSE 109* 165* 122* 83 98  --  99 86  BUN 10 8 9 9 12   --  8 7  CREATININE 0.82 0.58* 0.76 1.06 1.09  --  0.88 1.01  CALCIUM 7.2* 7.6* 7.8* 7.5* 7.6*  --  7.1* 7.3*  MG 1.8 2.3  --   --   --  1.8  --   --   PHOS 2.4* 3.3  --   --   --   --   --   --    GFR: Estimated Creatinine Clearance: 101.5 mL/min (by C-G formula based on SCr of 1.01 mg/dL). Liver Function Tests: Recent Labs  Lab 12/23/22 1116 12/24/22 0346 12/25/22 0352 12/26/22 0539  AST 55* 43* 46* 37  ALT 16 14 13 12   ALKPHOS  115 93 104 85  BILITOT 0.9 1.2 1.0 0.9  PROT 6.4* 5.7* 6.6 6.1*  ALBUMIN 2.6* 2.1* 2.4* 2.2*   No results for input(s): "LIPASE", "AMYLASE" in the last 168 hours.  No results for input(s): "AMMONIA" in the last 168 hours. Coagulation Profile: No results for input(s): "INR", "PROTIME" in the last 168 hours.  Cardiac Enzymes: No results for input(s): "CKTOTAL", "CKMB", "CKMBINDEX", "TROPONINI" in the last 168 hours. BNP (last 3 results) No results for input(s): "PROBNP" in the last 8760 hours. HbA1C: No results for input(s): "HGBA1C" in the last 72 hours. CBG: Recent Labs  Lab 12/26/22 1129 12/26/22 1611 12/26/22 1704 12/26/22 2049 12/27/22 0747  GLUCAP 93 67* 94 116* 74   Lipid Profile: No results for input(s): "CHOL", "HDL", "LDLCALC", "TRIG", "CHOLHDL", "LDLDIRECT" in the last 72 hours. Thyroid Function Tests: No results for input(s): "TSH", "T4TOTAL", "FREET4", "T3FREE", "THYROIDAB" in the last 72 hours. Anemia Panel: No results for input(s): "VITAMINB12", "FOLATE", "FERRITIN", "TIBC", "IRON", "RETICCTPCT" in the last 72 hours. Sepsis Labs: No results for input(s): "PROCALCITON", "LATICACIDVEN" in the last 168 hours.   Recent Results (from the past 240 hour(s))  Resp panel by RT-PCR (RSV, Flu A&B, Covid) Anterior Nasal Swab     Status: None   Collection Time: 12/18/22  2:39 AM   Specimen: Anterior Nasal Swab  Result Value Ref Range Status   SARS Coronavirus 2 by RT PCR NEGATIVE NEGATIVE Final    Comment: (NOTE) SARS-CoV-2 target nucleic acids are NOT DETECTED.  The SARS-CoV-2 RNA is generally detectable in upper respiratory specimens during the acute phase of infection. The lowest concentration of SARS-CoV-2 viral copies this assay can detect is 138 copies/mL. A negative result does not preclude SARS-Cov-2 infection and should not be used as the sole basis for treatment or other patient management decisions. A negative result may occur with  improper specimen  collection/handling, submission of specimen other than nasopharyngeal swab, presence of viral mutation(s) within the areas targeted by this assay, and inadequate number of viral copies(<138 copies/mL). A negative result must be combined with clinical observations, patient history, and epidemiological information. The expected result is Negative.  Fact Sheet for Patients:  BloggerCourse.com  Fact Sheet for Healthcare Providers:  SeriousBroker.it  This test is no t yet approved or cleared by the [place] FDA and  has been authorized for detection and/or diagnosis of SARS-CoV-2 by  FDA under an Emergency Use Authorization (EUA). This EUA will remain  in effect (meaning this test can be used) for the duration of the COVID-19 declaration under Section 564(b)(1) of the Act, 21 U.S.C.section 360bbb-3(b)(1), unless the authorization is terminated  or revoked sooner.       Influenza A by PCR NEGATIVE NEGATIVE Final   Influenza B by PCR NEGATIVE NEGATIVE Final    Comment: (NOTE) The Xpert Xpress SARS-CoV-2/FLU/RSV plus assay is intended as an aid in the diagnosis of influenza from Nasopharyngeal swab specimens and should not be used as a sole basis for treatment. Nasal washings and aspirates are unacceptable for Xpert Xpress SARS-CoV-2/FLU/RSV testing.  Fact Sheet for Patients: BloggerCourse.com  Fact Sheet for Healthcare Providers: SeriousBroker.it  This test is not yet approved or cleared by the [place] FDA and has been authorized for detection and/or diagnosis of SARS-CoV-2 by FDA under an Emergency Use Authorization (EUA). This EUA will remain in effect (meaning this test can be used) for the duration of the COVID-19 declaration under Section 564(b)(1) of the Act, 21 U.S.C. section 360bbb-3(b)(1), unless the authorization is terminated or revoked.     Resp Syncytial  Virus by PCR NEGATIVE NEGATIVE Final    Comment: (NOTE) Fact Sheet for Patients: BloggerCourse.com  Fact Sheet for Healthcare Providers: SeriousBroker.it  This test is not yet approved or cleared by the [place] FDA and has been authorized for detection and/or diagnosis of SARS-CoV-2 by FDA under an Emergency Use Authorization (EUA). This EUA will remain in effect (meaning this test can be used) for the duration of the COVID-19 declaration under Section 564(b)(1) of the Act, 21 U.S.C. section 360bbb-3(b)(1), unless the authorization is terminated or revoked.  Performed at [facility], [address], [place], [place] [address]   Culture, blood (Routine x 2)     Status: Abnormal   Collection Time: 12/18/22  2:41 AM   Specimen: BLOOD RIGHT FOREARM  Result Value Ref Range Status   Specimen Description   Final    BLOOD RIGHT FOREARM Performed at [facility], [address] [place], [place] [address]    Special Requests   Final    BOTTLES DRAWN AEROBIC AND ANAEROBIC Blood Culture results may not be optimal due to an excessive volume of blood received in culture bottles Performed at [facility], [address], [place], [place] [address]    Culture  Setup Time   Final    GRAM POSITIVE COCCI IN CLUSTERS IN BOTH AEROBIC AND ANAEROBIC BOTTLES CRITICAL VALUE NOTED.  VALUE IS CONSISTENT WITH PREVIOUSLY REPORTED AND CALLED VALUE.    Culture (A)  Final    STAPHYLOCOCCUS AUREUS SUSCEPTIBILITIES PERFORMED ON PREVIOUS CULTURE WITHIN THE LAST 5 DAYS. Performed at [facility], [address] [place], [place] [address]    Report Status 12/20/2022 FINAL  Final  Culture, blood (Routine x 2)     Status: Abnormal   Collection Time: 12/18/22  2:49 AM   Specimen: BLOOD LEFT FOREARM  Result Value Ref Range Status   Specimen Description   Final    BLOOD LEFT FOREARM Performed at [facility], [address], [place], [place] [address]    Special Requests   Final    BOTTLES DRAWN AEROBIC AND ANAEROBIC Blood Culture results may not be optimal due to an excessive volume of blood received in culture bottles Performed at [facility], [address] [place], [place] [address]  Culture  Setup Time   Final    GRAM POSITIVE COCCI IN CLUSTERS IN BOTH AEROBIC AND ANAEROBIC BOTTLES CRITICAL RESULT CALLED TO, READ BACK BY AND VERIFIED WITH: PHARMD C. Clearance Coots [id number] @1714  FH Performed at [facility], [address] [place], [place] [address]    Culture STAPHYLOCOCCUS AUREUS (A)  Final   Report Status 12/20/2022 FINAL  Final   Organism ID, Bacteria STAPHYLOCOCCUS AUREUS  Final      Susceptibility   Staphylococcus aureus - MIC*    CIPROFLOXACIN >=8 RESISTANT Resistant     ERYTHROMYCIN >=8 RESISTANT Resistant     GENTAMICIN <=0.5 SENSITIVE Sensitive     OXACILLIN 0.5 SENSITIVE Sensitive     TETRACYCLINE <=1 SENSITIVE Sensitive     VANCOMYCIN <=0.5 SENSITIVE Sensitive     TRIMETH/SULFA >=320 RESISTANT Resistant     CLINDAMYCIN <=0.25 SENSITIVE Sensitive     RIFAMPIN <=0.5 SENSITIVE Sensitive     Inducible Clindamycin NEGATIVE Sensitive     LINEZOLID 2 SENSITIVE Sensitive     * STAPHYLOCOCCUS AUREUS  Blood Culture ID Panel (Reflexed)     Status: Abnormal   Collection Time: 12/18/22  2:49 AM  Result Value Ref Range Status   Enterococcus faecalis NOT DETECTED NOT DETECTED Final   Enterococcus Faecium NOT DETECTED NOT DETECTED Final   Listeria monocytogenes NOT DETECTED NOT DETECTED Final   Staphylococcus species DETECTED (A) NOT DETECTED Final    Comment: CRITICAL RESULT CALLED TO, READ BACK BY AND VERIFIED WITH: PHARMD C. SHADE [id number] @1714  FH    Staphylococcus aureus (BCID) DETECTED (A) NOT DETECTED Final    Comment: CRITICAL RESULT CALLED TO, READ BACK BY AND VERIFIED WITH: PHARMD C. SHADE [id number] @1714  FH    Staphylococcus epidermidis  NOT DETECTED NOT DETECTED Final   Staphylococcus lugdunensis NOT DETECTED NOT DETECTED Final   Streptococcus species NOT DETECTED NOT DETECTED Final   Streptococcus agalactiae NOT DETECTED NOT DETECTED Final   Streptococcus pneumoniae NOT DETECTED NOT DETECTED Final   Streptococcus pyogenes NOT DETECTED NOT DETECTED Final   A.calcoaceticus-baumannii NOT DETECTED NOT DETECTED Final   Bacteroides fragilis NOT DETECTED NOT DETECTED Final   Enterobacterales NOT DETECTED NOT DETECTED Final   Enterobacter cloacae complex NOT DETECTED NOT DETECTED Final   Escherichia coli NOT DETECTED NOT DETECTED Final   Klebsiella aerogenes NOT DETECTED NOT DETECTED Final   Klebsiella oxytoca NOT DETECTED NOT DETECTED Final   Klebsiella pneumoniae NOT DETECTED NOT DETECTED Final   Proteus species NOT DETECTED NOT DETECTED Final   Salmonella species NOT DETECTED NOT DETECTED Final   Serratia marcescens NOT DETECTED NOT DETECTED Final   Haemophilus influenzae NOT DETECTED NOT DETECTED Final   Neisseria meningitidis NOT DETECTED NOT DETECTED Final   Pseudomonas aeruginosa NOT DETECTED NOT DETECTED Final   Stenotrophomonas maltophilia NOT DETECTED NOT DETECTED Final   Candida albicans NOT DETECTED NOT DETECTED Final   Candida auris NOT DETECTED NOT DETECTED Final   Candida glabrata NOT DETECTED NOT DETECTED Final   Candida krusei NOT DETECTED NOT DETECTED Final   Candida parapsilosis NOT DETECTED NOT DETECTED Final   Candida tropicalis NOT DETECTED NOT DETECTED Final   Cryptococcus neoformans/gattii NOT DETECTED NOT DETECTED Final   Meth resistant mecA/C and MREJ NOT DETECTED NOT DETECTED Final    Comment: Performed at [facility], [address] [place], [place] [address]  MRSA Next Gen by PCR, Nasal     Status: None   Collection Time: 12/18/22  1:01 PM  Specimen: Nasal Mucosa; Nasal Swab  Result Value Ref Range Status   MRSA by PCR Next Gen NOT DETECTED NOT DETECTED Final    Comment: (NOTE) The  GeneXpert MRSA Assay (FDA approved for NASAL specimens only), is one component of a comprehensive MRSA colonization surveillance program. It is not intended to diagnose MRSA infection nor to guide or monitor treatment for MRSA infections. Test performance is not FDA approved in patients less than 58 years old. Performed at [facility], [address] [place], [place] [address]   Culture, blood (Routine X 2) w Reflex to ID Panel     Status: None   Collection Time: 12/19/22  3:33 AM   Specimen: BLOOD RIGHT HAND  Result Value Ref Range Status   Specimen Description   Final    BLOOD RIGHT HAND Performed at [facility], [address] [place], [place] [address]    Special Requests   Final    BOTTLES DRAWN AEROBIC AND ANAEROBIC Blood Culture adequate volume Performed at [facility], [address] [place], [place] [address]    Culture   Final    NO GROWTH 5 DAYS Performed at [facility], [address] [place], [place] [address]    Report Status 12/24/2022 FINAL  Final  Culture, blood (Routine X 2) w Reflex to ID Panel     Status: None   Collection Time: 12/19/22  3:33 AM   Specimen: BLOOD RIGHT FOREARM  Result Value Ref Range Status   Specimen Description   Final    BLOOD RIGHT FOREARM Performed at [facility], [address] [place], [place] [address]    Special Requests   Final    BOTTLES DRAWN AEROBIC AND ANAEROBIC Blood Culture adequate volume Performed at [facility], [address], [place], [place] [address]    Culture   Final    NO GROWTH 5 DAYS Performed at [facility], [address], [place], [place] [address]    Report Status 12/24/2022 FINAL  Final         Radiology Studies: No results found.      Scheduled Meds:  atenolol  25 mg Oral Daily   Chlorhexidine Gluconate Cloth  6 each Topical Daily   citalopram  40 mg Oral Daily   clonazePAM  1 mg Oral BID    colchicine  0.6 mg Oral Daily   enoxaparin (LOVENOX) injection  40 mg Subcutaneous Daily   insulin aspart  0-5 Units Subcutaneous QHS   insulin aspart  0-6 Units Subcutaneous TID WC   insulin glargine-yfgn  5 Units Subcutaneous Daily   methadone  150 mg Oral Daily   sodium chloride flush  10-40 mL Intracatheter Q12H   sodium chloride flush  3 mL Intravenous Q12H   Continuous Infusions:  sodium chloride Stopped (12/18/22 1346)   sodium chloride     sodium chloride Stopped (12/19/22 1510)   sodium chloride 250 mL (12/25/22 1701)    ceFAZolin (ANCEF) IV 2 g (12/27/22 1034)     LOS: 9 days    Time spent: 45 minutes spent on chart review, discussion with nursing staff, consultants, updating family and interview/physical exam; more than 50% of that time was spent in counseling and/or coordination of care.    Joseph Art, DO [facility] Available via Epic secure chat 7am-7pm After these hours, please refer to coverage provider listed on [email or web address] 12/27/2022, 11:00 AM

## 2022-12-27 NOTE — Progress Notes (Signed)
Mobility Specialist Progress Note:   12/27/22 1100  Mobility  Activity Ambulated with assistance in hallway  Level of Assistance Contact guard assist, steadying assist  Assistive Device Front wheel walker  Distance Ambulated (ft) 190 ft  Activity Response Tolerated well  Mobility Referral Yes  $Mobility charge 1 Mobility  Mobility Specialist Start Time (ACUTE ONLY) 1100  Mobility Specialist Stop Time (ACUTE ONLY) 1120  Mobility Specialist Time Calculation (min) (ACUTE ONLY) 20 min   Pt eager for mobility session. Required only minG assist throughout ambulation. No unsteadiness noted. Pt denied any symptoms throughout. Back in chair with all needs met.   Addison Lank Mobility Specialist Please contact via SecureChat or  Rehab office at [phone]

## 2022-12-27 NOTE — Plan of Care (Signed)

## 2022-12-27 NOTE — Progress Notes (Addendum)
Patient Name: Jeffrey [name] Date of Encounter: 12/27/2022  Primary Cardiologist: None Electrophysiologist: None  Interval Summary   No acute overnight events. Patient reports doing relatively well. Thinks he is getting stronger. He denies chest pain, shortness of breath, or any new concerns.  Vital Signs    Vitals:   12/26/22 1614 12/26/22 1941 12/26/22 2327 12/27/22 0327  BP: 109/61 113/72 117/64 121/63  Pulse: (!) 58 62 (!) 55 (!) 55  Resp: 18 16 16 19   Temp: 97.8 F (36.6 C) 98.1 F (36.7 C) 98 F (36.7 C) 98 F (36.7 C)  TempSrc: Oral Oral Oral Oral  SpO2: 96% 93% 95% 96%  Weight:      Height:        Intake/Output Summary (Last 24 hours) at 12/27/2022 1158 Last data filed at 12/27/2022 0545 Gross per 24 hour  Intake 2007.83 ml  Output 1750 ml  Net 257.83 ml   Filed Weights   12/18/22 1305 12/24/22 0341 12/25/22 0345  Weight: 111.5 kg 109.4 kg 107.9 kg    Physical Exam    GEN- The patient is well appearing, alert and oriented x 3 today.   Lungs- Normal work of breathing Cardiac- Normal rate and regular rhythm. GI- soft, ND Extremities- no clubbing or cyanosis. No edema  Telemetry    Telemetry:  Normal sinus rhythm - personally reviewed. No pacing in past 24 hours.  Radiology/Studies: CT Chest/ABD/Pelvis 9/26 > no acute findings  CT R Knee 9/30 > questionable myositis  TEE 10/1 > LVEF 60-65%, small strand coming from the RA lead > fibrinous material vs vegetation  LHC 10/3 > negative for CAD     DEVICE HISTORY:  Abbott Dual Chamber > initial implant 1993 at [facility], generator change 09/25/2004, generator change 2019. Atrial lead MDT 5524, RV lead MDT 4058   MICRO: BCx2 9/26 > MSSA  BCx2 9/27 > negative  Hospital Course    Jeffrey [name] is a 57 y.o. male with a history of long QT s/p PPM at [facility] > 31yrs ago, opioid dependence, PTSD, substance abuse, poorly controlled DM with right foot ulceration (followed by Podiatry) who was admitted  9/26 with reports of fatigue, nausea, vomiting, SOB. He was found to have septic shock requiring vasopressors, AKI, & thrombocytopenia. CT chest/abd/pelvis was negative for acute findings. R knee CT was with questionable myositis. Blood cultures grew MSSA. The patient underwent TEE 10/1 which showed fibrinous material on RA lead. ID following for sepsis / CIED infection. LHC 10/3 negative for CAD.   Assessment & Plan    CIED  MSSA Bacteremia  TEE with fibrinous material on right atrial lead. 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 Leads >68 years old.  LHC negative for CAD. -ID following, on cefazolin. -Planned for hybrid OR/EP system extraction on 10/7 at 0730 with epicardial pacemaker implant. -NPO after MN on 10/7 -LRL was reduced to 45 bpm and atenolol reduced to allow for assessment of pacing burden. Does not appear to have needed pacing in the past 24 hours.    Long QT Syndrome: S/p dual PPM in 1993, Known loop in RV lead, unchanged in last 10 years.  -Avoid QT prolonging agents   HTN: -Reduced atenolol to 25 mg to minimize pacing burden (see above).  -Continue norvasc.   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   Poorly Controlled DM II  Hgb A1c 9.5 09/2022 -Per primary    Thrombocytopenia: Likely in setting of sepsis. Plts are 154 today. -On lovenox  -Check plts daily. Will need to be >100k for surgery on Monday.   For questions or updates, please contact [facility] Please consult [email or web address] for contact info under Cardiology/STEMI.  Signed, Nobie Putnam, MD [facility] - Electrophysiology  12/27/2022, 11:58 AM

## 2022-12-28 DIAGNOSIS — R7881 Bacteremia: Secondary | ICD-10-CM | POA: Diagnosis not present

## 2022-12-28 LAB — GLUCOSE, CAPILLARY
Glucose-Capillary: 104 mg/dL — ABNORMAL HIGH (ref 70–99)
Glucose-Capillary: 91 mg/dL (ref 70–99)
Glucose-Capillary: 121 mg/dL — ABNORMAL HIGH (ref 70–99)
Glucose-Capillary: 100 mg/dL — ABNORMAL HIGH (ref 70–99)

## 2022-12-28 LAB — CBC
HCT: 34.5 % — ABNORMAL LOW (ref 39.0–52.0)
Hemoglobin: 10.9 g/dL — ABNORMAL LOW (ref 13.0–17.0)
MCH: 27.7 pg (ref 26.0–34.0)
MCHC: 31.6 g/dL (ref 30.0–36.0)
MCV: 87.8 fL (ref 80.0–100.0)
Platelets: 177 10*3/uL (ref 150–400)
RBC: 3.93 MIL/uL — ABNORMAL LOW (ref 4.22–5.81)
RDW: 13.7 % (ref 11.5–15.5)
WBC: 7.5 10*3/uL (ref 4.0–10.5)
nRBC: 0 % (ref 0.0–0.2)

## 2022-12-28 LAB — BASIC METABOLIC PANEL
Anion gap: 8 (ref 5–15)
BUN: <5 mg/dL — ABNORMAL LOW (ref 6–20)
CO2: 25 mmol/L (ref 22–32)
Calcium: 7.6 mg/dL — ABNORMAL LOW (ref 8.9–10.3)
Chloride: 102 mmol/L (ref 98–111)
Creatinine, Ser: 0.87 mg/dL (ref 0.61–1.24)
GFR, Estimated: >60 mL/min (ref >60)
Glucose, Bld: 98 mg/dL (ref 70–99)
Potassium: 4 mmol/L (ref 3.5–5.1)
Sodium: 135 mmol/L (ref 135–145)

## 2022-12-28 LAB — HEMOGLOBIN A1C
Hgb A1c MFr Bld: 10.7 % — ABNORMAL HIGH (ref 4.8–5.6)
Mean Plasma Glucose: 260.39 mg/dL

## 2022-12-28 LAB — ABO/RH: ABO/RH(D): O NEG

## 2022-12-28 LAB — PROTIME-INR
INR: 1.2 (ref 0.8–1.2)
Prothrombin Time: 15.7 s — ABNORMAL HIGH (ref 11.4–15.2)

## 2022-12-28 LAB — APTT: aPTT: 41 s — ABNORMAL HIGH (ref 24–36)

## 2022-12-28 LAB — MAGNESIUM: Magnesium: 2.3 mg/dL (ref 1.7–2.4)

## 2022-12-28 MED ORDER — TRANEXAMIC ACID (OHS) PUMP PRIME SOLUTION
2.0000 mg/kg | INTRAVENOUS | Status: DC
  Filled 2022-12-28 (×2): qty 2.16

## 2022-12-28 MED ORDER — BISACODYL 5 MG PO TBEC
5.0000 mg | DELAYED_RELEASE_TABLET | Freq: Once | ORAL | Status: AC
  Administered 2022-12-28: 5 mg via ORAL
  Filled 2022-12-28: qty 1

## 2022-12-28 MED ORDER — VANCOMYCIN HCL 1500 MG/300ML IV SOLN
1500.0000 mg | Freq: Once | INTRAVENOUS | Status: AC
  Administered 2022-12-29: 1500 mg via INTRAVENOUS
  Filled 2022-12-28: qty 300

## 2022-12-28 MED ORDER — EPINEPHRINE HCL 5 MG/250ML IV SOLN IN NS
0.0000 ug/min | INTRAVENOUS | Status: DC
  Filled 2022-12-28 (×2): qty 250

## 2022-12-28 MED ORDER — TRANEXAMIC ACID 1000 MG/10ML IV SOLN
1.5000 mg/kg/h | INTRAVENOUS | Status: DC
  Filled 2022-12-28 (×2): qty 25

## 2022-12-28 MED ORDER — CEFAZOLIN SODIUM-DEXTROSE 2-4 GM/100ML-% IV SOLN
2.0000 g | INTRAVENOUS | Status: DC
  Filled 2022-12-28: qty 100

## 2022-12-28 MED ORDER — NITROGLYCERIN IN D5W 200-5 MCG/ML-% IV SOLN
2.0000 ug/min | INTRAVENOUS | Status: AC
  Administered 2022-12-29: 5 ug/min via INTRAVENOUS
  Filled 2022-12-28: qty 250

## 2022-12-28 MED ORDER — INSULIN REGULAR(HUMAN) IN NACL 100-0.9 UT/100ML-% IV SOLN
INTRAVENOUS | Status: DC
  Filled 2022-12-28: qty 100

## 2022-12-28 MED ORDER — CHLORHEXIDINE GLUCONATE CLOTH 2 % EX PADS
6.0000 | MEDICATED_PAD | Freq: Once | CUTANEOUS | Status: AC
  Administered 2022-12-28: 6 via TOPICAL

## 2022-12-28 MED ORDER — MANNITOL 20 % IV SOLN
INTRAVENOUS | Status: DC
  Filled 2022-12-28 (×2): qty 13

## 2022-12-28 MED ORDER — POTASSIUM CHLORIDE 2 MEQ/ML IV SOLN
80.0000 meq | INTRAVENOUS | Status: DC
  Filled 2022-12-28 (×2): qty 40

## 2022-12-28 MED ORDER — DEXMEDETOMIDINE HCL IN NACL 400 MCG/100ML IV SOLN
0.1000 ug/kg/h | INTRAVENOUS | Status: DC
  Filled 2022-12-28 (×2): qty 100

## 2022-12-28 MED ORDER — NOREPINEPHRINE 4 MG/250ML-% IV SOLN
0.0000 ug/min | INTRAVENOUS | Status: DC
  Filled 2022-12-28 (×2): qty 250

## 2022-12-28 MED ORDER — PLASMA-LYTE A IV SOLN
INTRAVENOUS | Status: DC
  Filled 2022-12-28 (×2): qty 2.5

## 2022-12-28 MED ORDER — TEMAZEPAM 15 MG PO CAPS: 15.0000 mg | ORAL_CAPSULE | Freq: Once | ORAL | Status: DC | PRN

## 2022-12-28 MED ORDER — TRANEXAMIC ACID (OHS) BOLUS VIA INFUSION
15.0000 mg/kg | INTRAVENOUS | Status: DC
  Filled 2022-12-28 (×2): qty 1619

## 2022-12-28 MED ORDER — MILRINONE LACTATE IN DEXTROSE 20-5 MG/100ML-% IV SOLN
0.3000 ug/kg/min | INTRAVENOUS | Status: DC
  Filled 2022-12-28: qty 100

## 2022-12-28 MED ORDER — CHLORHEXIDINE GLUCONATE CLOTH 2 % EX PADS
6.0000 | MEDICATED_PAD | Freq: Once | CUTANEOUS | Status: AC
  Administered 2022-12-29: 6 via TOPICAL

## 2022-12-28 MED ORDER — HEPARIN 30,000 UNITS/1000 ML (OHS) CELLSAVER SOLUTION
Status: DC
  Filled 2022-12-28 (×2): qty 1000

## 2022-12-28 MED ORDER — CHLORHEXIDINE GLUCONATE 0.12 % MT SOLN
15.0000 mL | Freq: Once | OROMUCOSAL | Status: AC
  Administered 2022-12-29: 15 mL via OROMUCOSAL
  Filled 2022-12-28: qty 15

## 2022-12-28 MED ORDER — PHENYLEPHRINE HCL-NACL 20-0.9 MG/250ML-% IV SOLN
30.0000 ug/min | INTRAVENOUS | Status: AC
  Administered 2022-12-29: 20 ug/min via INTRAVENOUS
  Filled 2022-12-28 (×2): qty 250

## 2022-12-28 NOTE — Progress Notes (Signed)
PROGRESS NOTE    Jeffrey [name]  YNW:[id number] DOB: [birth year] DOA: 12/18/2022 PCP: Tresa Garter, MD    Brief Narrative:  57 year old white male BMI 32 prior PTSD chronic BZD, opioid dependence previously on methadone.  Presented to the ED 9/25 fatigue nausea vomiting, SOB, Tmax 100.8 tachycardic slightly low blood pressure lactic acid 2.9-hypotension progressed to became septic started on Neo-Synephrine transferred to ICU.   BC ID MSSA. Tx to [facility] for TEE on 10/1.  10/7 plan for removal of pacemaker.   Assessment and Plan: Sepsis secondary to infected pacemaker wires?  -ID consulted: Continuing abx per ID -s/p TEE on 10/1 -EP following: LHC: 10/3, Tentatively planned for OR 10/7 with Lambert/Bartle -ortho consult: no current need for arthrocentesis- improved on IV Abx   Acute thrombocytopenia on admission likely from mild DIC  -trended up and now resolved   AKI on admission with hyponatremia/hypokalemia and metabolic acidosis -resolved  Long QTc previous PPM PPM to be removed as per cardiology 10/7   Obesity Estimated body mass index is 31.37 kg/m as calculated from the following:   Height as of this encounter: 6\' 1"  (1.854 m).   Weight as of this encounter: 107.9 kg.   Diabetes mellitus TY 2 D/c metformin -SSI only for now   HTN -d/c norvasc   Chronic pain Methadone dependence Continue methadone 150 has been on this for many many years- - continue Celexa 40 daily  Hypokalemia -repleted  DVT prophylaxis: enoxaparin (LOVENOX) injection 40 mg Start: 12/26/22 1000 SCDs Start: 12/18/22 0931    Code Status: Full Code   Disposition Plan:  Level of care: Telemetry Cardiac Status is: Inpatient Remains inpatient appropriate  Updated father 10/3  Consultants:  ID EP CVTS   Subjective: No overnight events  Objective: Vitals:   12/27/22 0327 12/27/22 1500 12/27/22 1934 12/28/22 0418  BP: 121/63 124/69 104/70 127/68  Pulse: (!) 55  (!) 54 85   Resp: 19 17 19 17   Temp: 98 F (36.7 C) 97.8 F (36.6 C) 97.8 F (36.6 C) 98.7 F (37.1 C)  TempSrc: Oral Oral Oral Oral  SpO2: 96%  97% 97%  Weight:      Height:        Intake/Output Summary (Last 24 hours) at 12/28/2022 1037 Last data filed at 12/28/2022 0500 Gross per 24 hour  Intake 995.56 ml  Output 2050 ml  Net -1054.44 ml    Filed Weights   12/18/22 1305 12/24/22 0341 12/25/22 0345  Weight: 111.5 kg 109.4 kg 107.9 kg    Examination:   General: Appearance:    Obese male in no acute distress     Lungs:      respirations unlabored  Heart:    Normal heart rate.    MS:   All extremities are intact.    Neurologic:   Awake, alert       Data Reviewed: I have personally reviewed following labs and imaging studies  CBC: Recent Labs  Lab 12/23/22 0522 12/24/22 0346 12/25/22 0352 12/26/22 0539 12/27/22 0500 12/28/22 0413  WBC 7.6 8.0 12.5* 7.4 6.5 7.5  NEUTROABS 5.6  --   --   --   --   --   HGB 10.7* 10.5* 11.9* 10.0* 10.0* 10.9*  HCT 33.3* 32.8* 37.4* 31.3* 31.5* 34.5*  MCV 88.1 88.4 89.9 88.4 88.5 87.8  PLT 100* 114* 166 135* 154 177   Basic Metabolic Panel: Recent Labs  Lab 12/22/22 0931 12/23/22 0522 12/23/22 1116 12/24/22 0346 12/25/22 0352  12/26/22 0500 12/26/22 0539 12/27/22 0500 12/28/22 0413  NA 139 134*   < > 137 135  --  133* 135 135  K 3.0* 3.7   < > 3.3* 3.6  --  3.1* 3.4* 4.0  CL 104 101   < > 104 102  --  99 100 102  CO2 28 26   < > 26 23  --  24 24 25   GLUCOSE 109* 165*   < > 83 98  --  99 86 98  BUN 10 8   < > 9 12  --  8 7 <5*  CREATININE 0.82 0.58*   < > 1.06 1.09  --  0.88 1.01 0.87  CALCIUM 7.2* 7.6*   < > 7.5* 7.6*  --  7.1* 7.3* 7.6*  MG 1.8 2.3  --   --   --  1.8  --   --  2.3  PHOS 2.4* 3.3  --   --   --   --   --   --   --    < > = values in this interval not displayed.   GFR: Estimated Creatinine Clearance: 117.8 mL/min (by C-G formula based on SCr of 0.87 mg/dL). Liver Function Tests: Recent Labs  Lab  12/23/22 1116 12/24/22 0346 12/25/22 0352 12/26/22 0539  AST 55* 43* 46* 37  ALT 16 14 13 12   ALKPHOS 115 93 104 85  BILITOT 0.9 1.2 1.0 0.9  PROT 6.4* 5.7* 6.6 6.1*  ALBUMIN 2.6* 2.1* 2.4* 2.2*   No results for input(s): "LIPASE", "AMYLASE" in the last 168 hours.  No results for input(s): "AMMONIA" in the last 168 hours. Coagulation Profile: No results for input(s): "INR", "PROTIME" in the last 168 hours.  Cardiac Enzymes: No results for input(s): "CKTOTAL", "CKMB", "CKMBINDEX", "TROPONINI" in the last 168 hours. BNP (last 3 results) No results for input(s): "PROBNP" in the last 8760 hours. HbA1C: No results for input(s): "HGBA1C" in the last 72 hours. CBG: Recent Labs  Lab 12/27/22 1249 12/27/22 1555 12/27/22 2102 12/27/22 2226 12/28/22 0907  GLUCAP 88 89 65* 115* 104*   Lipid Profile: No results for input(s): "CHOL", "HDL", "LDLCALC", "TRIG", "CHOLHDL", "LDLDIRECT" in the last 72 hours. Thyroid Function Tests: No results for input(s): "TSH", "T4TOTAL", "FREET4", "T3FREE", "THYROIDAB" in the last 72 hours. Anemia Panel: No results for input(s): "VITAMINB12", "FOLATE", "FERRITIN", "TIBC", "IRON", "RETICCTPCT" in the last 72 hours. Sepsis Labs: No results for input(s): "PROCALCITON", "LATICACIDVEN" in the last 168 hours.   Recent Results (from the past 240 hour(s))  MRSA Next Gen by PCR, Nasal     Status: None   Collection Time: 12/18/22  1:01 PM   Specimen: Nasal Mucosa; Nasal Swab  Result Value Ref Range Status   MRSA by PCR Next Gen NOT DETECTED NOT DETECTED Final    Comment: (NOTE) The GeneXpert MRSA Assay (FDA approved for NASAL specimens only), is one component of a comprehensive MRSA colonization surveillance program. It is not intended to diagnose MRSA infection nor to guide or monitor treatment for MRSA infections. Test performance is not FDA approved in patients less than 61 years old. Performed at [facility], [address], [place], [place] [address]   Culture, blood (Routine X 2) w Reflex to ID Panel     Status: None   Collection Time: 12/19/22  3:33 AM   Specimen: BLOOD RIGHT HAND  Result Value Ref Range Status   Specimen Description   Final  BLOOD RIGHT HAND Performed at [facility], [address], [place], [place] [address]    Special Requests   Final    BOTTLES DRAWN AEROBIC AND ANAEROBIC Blood Culture adequate volume Performed at [facility], [address], [place], [place] [address]    Culture   Final    NO GROWTH 5 DAYS Performed at [facility], [address] [place], [place] [address]    Report Status 12/24/2022 FINAL  Final  Culture, blood (Routine X 2) w Reflex to ID Panel     Status: None   Collection Time: 12/19/22  3:33 AM   Specimen: BLOOD RIGHT FOREARM  Result Value Ref Range Status   Specimen Description   Final    BLOOD RIGHT FOREARM Performed at [facility], [address], [place], [place] [address]    Special Requests   Final    BOTTLES DRAWN AEROBIC AND ANAEROBIC Blood Culture adequate volume Performed at [facility], [address], [place], [place] [address]    Culture   Final    NO GROWTH 5 DAYS Performed at [facility], [address] [place], [place] [address]    Report Status 12/24/2022 FINAL  Final         Radiology Studies: No results found.      Scheduled Meds:  atenolol  25 mg Oral Daily   Chlorhexidine Gluconate Cloth  6 each Topical Daily   citalopram  40 mg Oral Daily   clonazePAM  1 mg Oral BID   colchicine  0.6 mg Oral Daily   enoxaparin (LOVENOX) injection  40 mg Subcutaneous Daily   insulin aspart  0-5 Units Subcutaneous QHS   insulin aspart  0-6 Units Subcutaneous TID WC   methadone  150 mg Oral Daily   sodium chloride flush  10-40 mL Intracatheter Q12H   sodium chloride flush  3 mL Intravenous Q12H   Continuous Infusions:  sodium chloride Stopped (12/18/22  1346)   sodium chloride     sodium chloride Stopped (12/19/22 1510)   sodium chloride 10 mL/hr at 12/27/22 2359    ceFAZolin (ANCEF) IV 2 g (12/28/22 0231)     LOS: 10 days    Time spent: 45 minutes spent on chart review, discussion with nursing staff, consultants, updating family and interview/physical exam; more than 50% of that time was spent in counseling and/or coordination of care.    Joseph Art, DO [facility] Available via Epic secure chat 7am-7pm After these hours, please refer to coverage provider listed on [email or web address] 12/28/2022, 10:37 AM

## 2022-12-28 NOTE — Plan of Care (Signed)

## 2022-12-28 NOTE — Progress Notes (Signed)
Hypoglycemic Event  CBG: 65  Treatment: 4 oz juice/soda  Symptoms: None  Follow-up CBG: Time: 2226 CBG Result: 115  Possible Reasons for Event: Inadequate meal intake  Comments/MD notified: pt reports decreased appetite    Alonza Bogus

## 2022-12-28 NOTE — Progress Notes (Signed)
Patient Name: Jeffrey [name] Date of Encounter: 12/28/2022  Primary Cardiologist: None Electrophysiologist: None  Interval Summary   No acute overnight events. Continues to report doing relatively well. He denies chest pain, shortness of breath, or any new concerns.  Vital Signs    Vitals:   12/27/22 0327 12/27/22 1500 12/27/22 1934 12/28/22 0418  BP: 121/63 124/69 104/70 127/68  Pulse: (!) 55  (!) 54 85  Resp: 19 17 19 17   Temp: 98 F (36.7 C) 97.8 F (36.6 C) 97.8 F (36.6 C) 98.7 F (37.1 C)  TempSrc: Oral Oral Oral Oral  SpO2: 96%  97% 97%  Weight:      Height:        Intake/Output Summary (Last 24 hours) at 12/28/2022 1218 Last data filed at 12/28/2022 0500 Gross per 24 hour  Intake 995.56 ml  Output 2050 ml  Net -1054.44 ml   Filed Weights   12/18/22 1305 12/24/22 0341 12/25/22 0345  Weight: 111.5 kg 109.4 kg 107.9 kg    Physical Exam    GEN- The patient is well appearing, alert and oriented x 3 today.   Lungs- Normal work of breathing Cardiac- Normal rate and regular rhythm. GI- soft, ND Extremities- no clubbing or cyanosis. No edema  Telemetry    Telemetry:  Normal sinus rhythm - personally reviewed. No pacing in past 24 hours.  Radiology/Studies: CT Chest/ABD/Pelvis 9/26 > no acute findings  CT R Knee 9/30 > questionable myositis  TEE 10/1 > LVEF 60-65%, small strand coming from the RA lead > fibrinous material vs vegetation  LHC 10/3 > negative for CAD     DEVICE HISTORY:  Abbott Dual Chamber > initial implant 1993 at [facility], generator change 09/25/2004, generator change 2019. Atrial lead MDT 5524, RV lead MDT 4058   MICRO: BCx2 9/26 > MSSA  BCx2 9/27 > Negative  Hospital Course    Jeffrey [name] is a 57 y.o. male with a history of long QT s/p PPM at [facility] > 64yrs ago, opioid dependence, PTSD, substance abuse, poorly controlled DM with right foot ulceration (followed by Podiatry) who was admitted 9/26 with reports of fatigue,  nausea, vomiting, SOB. He was found to have septic shock requiring vasopressors, AKI, & thrombocytopenia. CT chest/abd/pelvis was negative for acute findings. R knee CT was with questionable myositis. Blood cultures grew MSSA. The patient underwent TEE 10/1 which showed fibrinous material on RA lead. ID following for sepsis / CIED infection. LHC 10/3 negative for CAD.   Assessment & Plan    CIED  MSSA Bacteremia  TEE with fibrinous material on right atrial lead. 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 Leads >77 years old.  LHC negative for CAD. -ID following, on cefazolin. -Planned for hybrid OR/EP system extraction on 10/7 at 0730 with epicardial pacemaker implant. -NPO after MN. -LRL was reduced to 45 bpm and atenolol reduced to allow for assessment of pacing burden. Does not appear to have needed pacing in the past 48 hours.    Long QT Syndrome: S/p dual PPM in 1993, Known loop in RV lead, unchanged in last 10 years.  -Avoid QT prolonging agents   HTN: -Reduced atenolol to 25 mg to minimize pacing burden (see above).  -Continue norvasc.   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   Poorly Controlled DM II  Hgb A1c 9.5 09/2022 -Per primary  Thrombocytopenia: Likely in setting of sepsis. Plts are 177 today. -On lovenox  -Check plts daily. Will need to be >100k for surgery on Monday.   For questions or updates, please contact [facility] Please consult [email or web address] for contact info under Cardiology/STEMI.  Signed, Nobie Putnam, MD [facility] - Electrophysiology  12/28/2022, 12:18 PM

## 2022-12-28 NOTE — Progress Notes (Addendum)
Mobility Specialist Progress Note:   12/28/22 1500  Mobility  Activity Ambulated with assistance in hallway  Level of Assistance Contact guard assist, steadying assist  Assistive Device Front wheel walker  Distance Ambulated (ft) 200 ft  Activity Response Tolerated well  Mobility Referral Yes  $Mobility charge 1 Mobility  Mobility Specialist Start Time (ACUTE ONLY) 1525  Mobility Specialist Stop Time (ACUTE ONLY) 1550  Mobility Specialist Time Calculation (min) (ACUTE ONLY) 25 min   Pt eager for mobility session. Required only minG assist during ambulation. No c/o throughout. Pt ready for s/p tomorrow. Back in chair with all needs met.   Addison Lank Mobility Specialist Please contact via SecureChat or  Rehab office at [phone]

## 2022-12-29 ENCOUNTER — Inpatient Hospital Stay (HOSPITAL_COMMUNITY): Payer: MEDICAID

## 2022-12-29 ENCOUNTER — Encounter (HOSPITAL_COMMUNITY): Payer: Self-pay | Admitting: Pulmonary Disease

## 2022-12-29 ENCOUNTER — Encounter (HOSPITAL_COMMUNITY)
Admission: EM | Disposition: A | Discharge status: Home / Self Care | Payer: Self-pay | Source: Home / Self Care | Attending: Internal Medicine

## 2022-12-29 ENCOUNTER — Inpatient Hospital Stay (HOSPITAL_COMMUNITY)
Admission: EM | Disposition: A | Discharge status: Home / Self Care | Payer: Self-pay | Source: Home / Self Care | Attending: Internal Medicine

## 2022-12-29 ENCOUNTER — Inpatient Hospital Stay (HOSPITAL_COMMUNITY): Payer: MEDICAID | Admitting: Certified Registered Nurse Anesthetist

## 2022-12-29 DIAGNOSIS — T827XXA Infection and inflammatory reaction due to other cardiac and vascular devices, implants and grafts, initial encounter: Secondary | ICD-10-CM

## 2022-12-29 DIAGNOSIS — T82190A Other mechanical complication of cardiac electrode, initial encounter: Secondary | ICD-10-CM

## 2022-12-29 DIAGNOSIS — R509 Fever, unspecified: Secondary | ICD-10-CM | POA: Diagnosis not present

## 2022-12-29 HISTORY — PX: LEAD EXTRACTION: EP1211

## 2022-12-29 HISTORY — PX: TEE WITHOUT CARDIOVERSION: SHX5443

## 2022-12-29 LAB — URINALYSIS, ROUTINE W REFLEX MICROSCOPIC
Bilirubin Urine: NEGATIVE
Glucose, UA: NEGATIVE mg/dL
Hgb urine dipstick: NEGATIVE
Ketones, ur: 5 mg/dL — AB
Leukocytes,Ua: NEGATIVE
Nitrite: NEGATIVE
Protein, ur: NEGATIVE mg/dL
Specific Gravity, Urine: 1.005 (ref 1.005–1.030)
pH: 6 (ref 5.0–8.0)

## 2022-12-29 LAB — POCT I-STAT 7, (LYTES, BLD GAS, ICA,H+H)
Acid-base deficit: 2 mmol/L (ref 0.0–2.0)
Bicarbonate: 23.2 mmol/L (ref 20.0–28.0)
Calcium, Ion: 1.15 mmol/L (ref 1.15–1.40)
HCT: 34 % — ABNORMAL LOW (ref 39.0–52.0)
Hemoglobin: 11.6 g/dL — ABNORMAL LOW (ref 13.0–17.0)
O2 Saturation: 100 %
Potassium: 3.9 mmol/L (ref 3.5–5.1)
Sodium: 137 mmol/L (ref 135–145)
TCO2: 24 mmol/L (ref 22–32)
pCO2 arterial: 41.7 mm[Hg] (ref 32–48)
pH, Arterial: 7.352 (ref 7.35–7.45)
pO2, Arterial: 292 mm[Hg] — ABNORMAL HIGH (ref 83–108)

## 2022-12-29 LAB — BLOOD GAS, ARTERIAL
Acid-Base Excess: 2.6 mmol/L — ABNORMAL HIGH (ref 0.0–2.0)
Bicarbonate: 27.2 mmol/L (ref 20.0–28.0)
Drawn by: 51147
O2 Saturation: 99.6 %
Patient temperature: 36.4
pCO2 arterial: 40 mm[Hg] (ref 32–48)
pH, Arterial: 7.44 (ref 7.35–7.45)
pO2, Arterial: 100 mm[Hg] (ref 83–108)

## 2022-12-29 LAB — SURGICAL PCR SCREEN
MRSA, PCR: NEGATIVE
Staphylococcus aureus: POSITIVE — AB

## 2022-12-29 LAB — POCT I-STAT, CHEM 8
BUN: 4 mg/dL — ABNORMAL LOW (ref 6–20)
Calcium, Ion: 1.1 mmol/L — ABNORMAL LOW (ref 1.15–1.40)
Chloride: 101 mmol/L (ref 98–111)
Creatinine, Ser: 0.7 mg/dL (ref 0.61–1.24)
Glucose, Bld: 100 mg/dL — ABNORMAL HIGH (ref 70–99)
HCT: 31 % — ABNORMAL LOW (ref 39.0–52.0)
Hemoglobin: 10.5 g/dL — ABNORMAL LOW (ref 13.0–17.0)
Potassium: 4 mmol/L (ref 3.5–5.1)
Sodium: 137 mmol/L (ref 135–145)
TCO2: 27 mmol/L (ref 22–32)

## 2022-12-29 LAB — CBC
HCT: 35.5 % — ABNORMAL LOW (ref 39.0–52.0)
Hemoglobin: 11 g/dL — ABNORMAL LOW (ref 13.0–17.0)
MCH: 27.4 pg (ref 26.0–34.0)
MCHC: 31 g/dL (ref 30.0–36.0)
MCV: 88.5 fL (ref 80.0–100.0)
Platelets: 204 10*3/uL (ref 150–400)
RBC: 4.01 MIL/uL — ABNORMAL LOW (ref 4.22–5.81)
RDW: 13.8 % (ref 11.5–15.5)
WBC: 13.9 10*3/uL — ABNORMAL HIGH (ref 4.0–10.5)
nRBC: 0 % (ref 0.0–0.2)

## 2022-12-29 LAB — BASIC METABOLIC PANEL
Anion gap: 15 (ref 5–15)
BUN: 6 mg/dL (ref 6–20)
CO2: 19 mmol/L — ABNORMAL LOW (ref 22–32)
Calcium: 7.6 mg/dL — ABNORMAL LOW (ref 8.9–10.3)
Chloride: 100 mmol/L (ref 98–111)
Creatinine, Ser: 1.06 mg/dL (ref 0.61–1.24)
GFR, Estimated: >60 mL/min (ref >60)
Glucose, Bld: 137 mg/dL — ABNORMAL HIGH (ref 70–99)
Potassium: 3.8 mmol/L (ref 3.5–5.1)
Sodium: 134 mmol/L — ABNORMAL LOW (ref 135–145)

## 2022-12-29 LAB — ECHO INTRAOPERATIVE TEE
Height: 73 in
Weight: 4000 [oz_av]

## 2022-12-29 LAB — GLUCOSE, CAPILLARY
Glucose-Capillary: 116 mg/dL — ABNORMAL HIGH (ref 70–99)
Glucose-Capillary: 140 mg/dL — ABNORMAL HIGH (ref 70–99)
Glucose-Capillary: 148 mg/dL — ABNORMAL HIGH (ref 70–99)

## 2022-12-29 LAB — PREPARE RBC (CROSSMATCH)

## 2022-12-29 SURGERY — LEAD EXTRACTION
Anesthesia: General

## 2022-12-29 SURGERY — MEDIAN STERNOTOMY
Anesthesia: General | Site: Chest

## 2022-12-29 MED ORDER — LACTATED RINGERS IV SOLN: INTRAVENOUS | Status: DC | PRN

## 2022-12-29 MED ORDER — THROMBIN (RECOMBINANT) 20000 UNITS EX SOLR
CUTANEOUS | Status: AC
  Filled 2022-12-29: qty 20000

## 2022-12-29 MED ORDER — FENTANYL CITRATE (PF) 100 MCG/2ML IJ SOLN
INTRAMUSCULAR | Status: DC | PRN
Start: 2022-12-29 — End: 2022-12-29
  Administered 2022-12-29 (×7): 50 ug via INTRAVENOUS

## 2022-12-29 MED ORDER — LIDOCAINE 2% (20 MG/ML) 5 ML SYRINGE
INTRAMUSCULAR | Status: DC | PRN
Start: 2022-12-29 — End: 2022-12-29
  Administered 2022-12-29: 40 mg via INTRAVENOUS

## 2022-12-29 MED ORDER — HEPARIN 6000 UNIT IRRIGATION SOLUTION
Status: AC
  Filled 2022-12-29: qty 500

## 2022-12-29 MED ORDER — SUGAMMADEX SODIUM 200 MG/2ML IV SOLN
INTRAVENOUS | Status: DC | PRN
Start: 2022-12-29 — End: 2022-12-29
  Administered 2022-12-29: 200 mg via INTRAVENOUS

## 2022-12-29 MED ORDER — SODIUM CHLORIDE 0.9 % IV SOLN
INTRAVENOUS | Status: AC
  Filled 2022-12-29: qty 2

## 2022-12-29 MED ORDER — ALBUMIN HUMAN 5 % IV SOLN: INTRAVENOUS | Status: DC | PRN

## 2022-12-29 MED ORDER — SODIUM CHLORIDE 0.9 % IV SOLN
INTRAVENOUS | Status: DC | PRN
  Administered 2022-12-29: 80 mg

## 2022-12-29 MED ORDER — HEPARIN (PORCINE) IN NACL 1000-0.9 UT/500ML-% IV SOLN
INTRAVENOUS | Status: DC | PRN
  Administered 2022-12-29: 500 mL

## 2022-12-29 MED ORDER — SODIUM CHLORIDE 0.9% IV SOLUTION: Freq: Once | INTRAVENOUS | Status: DC

## 2022-12-29 MED ORDER — MUPIROCIN 2 % EX OINT
1.0000 | TOPICAL_OINTMENT | Freq: Two times a day (BID) | CUTANEOUS | Status: DC
  Administered 2022-12-29 – 2023-01-02 (×9): 1 via NASAL
  Filled 2022-12-29: qty 22

## 2022-12-29 MED ORDER — HEPARIN SODIUM (PORCINE) 1000 UNIT/ML IJ SOLN
INTRAMUSCULAR | Status: AC
  Filled 2022-12-29: qty 1

## 2022-12-29 MED ORDER — ROCURONIUM BROMIDE 10 MG/ML (PF) SYRINGE
PREFILLED_SYRINGE | INTRAVENOUS | Status: DC | PRN
  Administered 2022-12-29: 60 mg via INTRAVENOUS
  Administered 2022-12-29: 30 mg via INTRAVENOUS
  Administered 2022-12-29: 20 mg via INTRAVENOUS

## 2022-12-29 MED ORDER — ONDANSETRON HCL 4 MG/2ML IJ SOLN
INTRAMUSCULAR | Status: DC | PRN
Start: 2022-12-29 — End: 2022-12-29
  Administered 2022-12-29: 4 mg via INTRAVENOUS

## 2022-12-29 MED ORDER — MIDAZOLAM HCL 2 MG/2ML IJ SOLN
INTRAMUSCULAR | Status: DC | PRN
Start: 2022-12-29 — End: 2022-12-29
  Administered 2022-12-29 (×2): 1 mg via INTRAVENOUS

## 2022-12-29 MED ORDER — NITROGLYCERIN 0.2 MG/ML ON CALL CATH LAB
INTRAVENOUS | Status: DC | PRN
Start: 2022-12-29 — End: 2022-12-29
  Administered 2022-12-29 (×2): 20 ug via INTRAVENOUS
  Administered 2022-12-29: 10 ug via INTRAVENOUS

## 2022-12-29 MED ORDER — CHLORHEXIDINE GLUCONATE CLOTH 2 % EX PADS: 6.0000 | MEDICATED_PAD | Freq: Every day | CUTANEOUS | Status: DC

## 2022-12-29 MED ORDER — PROPOFOL 10 MG/ML IV BOLUS
INTRAVENOUS | Status: DC | PRN
  Administered 2022-12-29: 40 mg via INTRAVENOUS
  Administered 2022-12-29: 30 mg via INTRAVENOUS
  Administered 2022-12-29: 80 mg via INTRAVENOUS

## 2022-12-29 SURGICAL SUPPLY — 19 items
BALLN OCL BRIDGE 80X90X.9 60 (BALLOONS) ×1
BALLOON OCL BRIDGE 80X90X.9 60 (BALLOONS) IMPLANT
CABLE SURGICAL S-101-97-12 (CABLE) ×1 IMPLANT
CLOSURE PERCLOSE PROSTYLE (VASCULAR PRODUCTS) IMPLANT
DEVICE LCKNG LEAD CARDIAC (CATHETERS) IMPLANT
DEVICE ONE SNARE 15MM (VASCULAR PRODUCTS) IMPLANT
PAD DEFIB RADIO PHYSIO CONN (PAD) ×1 IMPLANT
REMOVAL LLD CARDIAC LEAD EZ (CATHETERS) ×2
SHEATH CRV INNER FEMORAL 12FR (SHEATH) IMPLANT
SHEATH DILAT COONS TAPER 22F (SHEATH) IMPLANT
SHEATH DRYSEAL FLEX 26FR 33CM (SHEATH) IMPLANT
SHEATH LASER 16 FR GLIDELIGHT (SHEATH) IMPLANT
SHEATH PINNACLE 6F 10CM (SHEATH) IMPLANT
SHEATH PINNACLE 8F 10CM (SHEATH) IMPLANT
SHEATH PROBE COVER 6X72 (BAG) IMPLANT
SHEATH TIGHTRAIL MECH 13F (SHEATH) IMPLANT
SNARE NDL EYE 20MM RETRIEVL (CATHETERS) IMPLANT
SNARE NEEDLE EYE 20MM RETRIEVL (CATHETERS) ×1 IMPLANT
TRAY PACEMAKER INSERTION (PACKS) ×1 IMPLANT

## 2022-12-29 NOTE — Anesthesia Preprocedure Evaluation (Addendum)
Anesthesia Evaluation  Patient identified by MRN, date of birth, ID band Patient awake    Reviewed: Allergy & Precautions, NPO status , Patient's Chart, lab work & pertinent test results  Airway Mallampati: I  TM Distance: >3 FB Neck ROM: Full    Dental  (+) Edentulous Upper, Edentulous Lower   Pulmonary neg pulmonary ROS    + decreased breath sounds      Cardiovascular hypertension, Pt. on home beta blockers + pacemaker  Rhythm:Regular Rate:Normal     Neuro/Psych  PSYCHIATRIC DISORDERS Anxiety     negative neurological ROS     GI/Hepatic negative GI ROS, Neg liver ROS,,,  Endo/Other  diabetes, Type 2, Oral Hypoglycemic Agents    Renal/GU negative Renal ROS     Musculoskeletal negative musculoskeletal ROS (+)    Abdominal   Peds  Hematology negative hematology ROS (+)   Anesthesia Other Findings   Reproductive/Obstetrics                             Anesthesia Physical Anesthesia Plan  ASA: 4  Anesthesia Plan: General   Post-op Pain Management:    Induction: Intravenous  PONV Risk Score and Plan: 3 and Ondansetron and Treatment may vary due to age or medical condition  Airway Management Planned: Oral ETT  Additional Equipment: Arterial line, CVP, TEE and Ultrasound Guidance Line Placement  Intra-op Plan:   Post-operative Plan: Possible Post-op intubation/ventilation  Informed Consent:   Plan Discussed with: CRNA  Anesthesia Plan Comments:        Anesthesia Quick Evaluation

## 2022-12-29 NOTE — Progress Notes (Signed)
PROGRESS NOTE    Jeffrey [name]  ZOX:[id number] DOB: 04/04/1965 DOA: 12/18/2022 PCP: Tresa Garter, MD    Brief Narrative:  57 year old white male BMI 32 prior PTSD chronic BZD, opioid dependence previously on methadone.  Presented to the ED 9/25 fatigue nausea vomiting, SOB, Tmax 100.8 tachycardic slightly low blood pressure lactic acid 2.9-hypotension progressed to became septic started on Neo-Synephrine transferred to ICU.   BC ID MSSA. Tx to [facility] for TEE on 10/1.  10/7 went to cath lab for removal of pacemaker.   Assessment and Plan: Sepsis secondary to infected pacemaker wires?  -ID consulted: Continuing abx per ID -s/p TEE on 10/1 -EP following: LHC: 10/3-- cath lab for removal of infected pacemake on 10/7 with Lambert/Bartle -ortho consult: no current need for arthrocentesis- improved on IV Abx   Acute thrombocytopenia on admission likely from mild DIC  -trended up and now resolved   AKI on admission with hyponatremia/hypokalemia and metabolic acidosis -resolved  Long QTc previous PPM PPM removed as per cardiology 10/7   Obesity Estimated body mass index is 32.98 kg/m as calculated from the following:   Height as of this encounter: 6\' 1"  (1.854 m).   Weight as of this encounter: 113.4 kg.   Diabetes mellitus TY 2 D/c metformin -SSI only for now   HTN -d/c norvasc   Chronic pain Methadone dependence Continue methadone 150 has been on this for many many years- - continue Celexa 40 daily  Hypokalemia -repleted  DVT prophylaxis: SCDs Start: 12/18/22 0931    Code Status: Full Code   Disposition Plan:  Level of care: Progressive Cardiac Status is: Inpatient Remains inpatient appropriate  Updated father 10/3  Consultants:  ID EP CVTS   Subjective: Saw patient after procedure-- having some bleeding  Objective: Vitals:   12/29/22 1426 12/29/22 1445 12/29/22 1500 12/29/22 1600  BP: 113/63 (!) 119/55 120/65 114/64  Pulse: 75 70 73 69  Resp:     18  Temp:    98.1 F (36.7 C)  TempSrc:    Oral  SpO2:  96% 96% 94%  Weight:      Height:        Intake/Output Summary (Last 24 hours) at 12/29/2022 1725 Last data filed at 12/29/2022 1145 Gross per 24 hour  Intake 1500 ml  Output 1375 ml  Net 125 ml    Filed Weights   12/24/22 0341 12/25/22 0345 12/29/22 0637  Weight: 109.4 kg 107.9 kg 113.4 kg    Examination:   General: Appearance:    Obese male in no acute distress   Blood under wound  Lungs:      respirations unlabored  Heart:    Normal heart rate.    MS:   All extremities are intact.    Neurologic:   Awake, alert       Data Reviewed: I have personally reviewed following labs and imaging studies  CBC: Recent Labs  Lab 12/23/22 0522 12/24/22 0346 12/25/22 0352 12/26/22 0539 12/27/22 0500 12/28/22 0413 12/29/22 0811 12/29/22 0815 12/29/22 1429  WBC 7.6   < > 12.5* 7.4 6.5 7.5  --   --  13.9*  NEUTROABS 5.6  --   --   --   --   --   --   --   --   HGB 10.7*   < > 11.9* 10.0* 10.0* 10.9* 10.5* 11.6* 11.0*  HCT 33.3*   < > 37.4* 31.3* 31.5* 34.5* 31.0* 34.0* 35.5*  MCV 88.1   < >  89.9 88.4 88.5 87.8  --   --  88.5  PLT 100*   < > 166 135* 154 177  --   --  204   < > = values in this interval not displayed.   Basic Metabolic Panel: Recent Labs  Lab 12/23/22 0522 12/23/22 1116 12/25/22 0352 12/26/22 0500 12/26/22 0539 12/27/22 0500 12/28/22 0413 12/29/22 0811 12/29/22 0815 12/29/22 1429  NA 134*   < > 135  --  133* 135 135 137 137 134*  K 3.7   < > 3.6  --  3.1* 3.4* 4.0 4.0 3.9 3.8  CL 101   < > 102  --  99 100 102 101  --  100  CO2 26   < > 23  --  24 24 25   --   --  19*  GLUCOSE 165*   < > 98  --  99 86 98 100*  --  137*  BUN 8   < > 12  --  8 7 <5* 4*  --  6  CREATININE 0.58*   < > 1.09  --  0.88 1.01 0.87 0.70  --  1.06  CALCIUM 7.6*   < > 7.6*  --  7.1* 7.3* 7.6*  --   --  7.6*  MG 2.3  --   --  1.8  --   --  2.3  --   --   --   PHOS 3.3  --   --   --   --   --   --   --   --   --     < > = values in this interval not displayed.   GFR: Estimated Creatinine Clearance: 99 mL/min (by C-G formula based on SCr of 1.06 mg/dL). Liver Function Tests: Recent Labs  Lab 12/23/22 1116 12/24/22 0346 12/25/22 0352 12/26/22 0539  AST 55* 43* 46* 37  ALT 16 14 13 12   ALKPHOS 115 93 104 85  BILITOT 0.9 1.2 1.0 0.9  PROT 6.4* 5.7* 6.6 6.1*  ALBUMIN 2.6* 2.1* 2.4* 2.2*   No results for input(s): "LIPASE", "AMYLASE" in the last 168 hours.  No results for input(s): "AMMONIA" in the last 168 hours. Coagulation Profile: Recent Labs  Lab 12/28/22 2327  INR 1.2    Cardiac Enzymes: No results for input(s): "CKTOTAL", "CKMB", "CKMBINDEX", "TROPONINI" in the last 168 hours. BNP (last 3 results) No results for input(s): "PROBNP" in the last 8760 hours. HbA1C: Recent Labs    12/28/22 2327  HGBA1C 10.7*   CBG: Recent Labs  Lab 12/28/22 1219 12/28/22 1643 12/28/22 2153 12/29/22 0604 12/29/22 1627  GLUCAP 91 121* 100* 116* 140*   Lipid Profile: No results for input(s): "CHOL", "HDL", "LDLCALC", "TRIG", "CHOLHDL", "LDLDIRECT" in the last 72 hours. Thyroid Function Tests: No results for input(s): "TSH", "T4TOTAL", "FREET4", "T3FREE", "THYROIDAB" in the last 72 hours. Anemia Panel: No results for input(s): "VITAMINB12", "FOLATE", "FERRITIN", "TIBC", "IRON", "RETICCTPCT" in the last 72 hours. Sepsis Labs: No results for input(s): "PROCALCITON", "LATICACIDVEN" in the last 168 hours.   Recent Results (from the past 240 hour(s))  Surgical pcr screen     Status: Abnormal   Collection Time: 12/28/22 11:36 PM   Specimen: Nasal Mucosa; Nasal Swab  Result Value Ref Range Status   MRSA, PCR NEGATIVE NEGATIVE Final   Staphylococcus aureus POSITIVE (A) NEGATIVE Final    Comment: (NOTE) The Xpert SA Assay (FDA approved for NASAL specimens in patients 60 years of age and  older), is one component of a comprehensive surveillance program. It is not intended to diagnose infection  nor to guide or monitor treatment. Performed at [facility], [address], [place], [place] [address]          Radiology Studies: EP PPM/ICD IMPLANT  Result Date: 12/29/2022  CONCLUSIONS:  1.  Sinus node dysfunction with permanent pacemaker in situ  2.  CIED associated bacteremia and septic shock now post pacemaker system extraction  3.  No early apparent complications.   EP STUDY  Result Date: 12/29/2022  CONCLUSIONS:  1.  Sinus node dysfunction with permanent pacemaker in situ  2.  CIED associated bacteremia and septic shock now post pacemaker system extraction  3.  No early apparent complications.   DG Chest 2 View  Result Date: 12/29/2022 CLINICAL DATA:  [id number], preoperative chest exam. EXAM: CHEST - 2 VIEW COMPARISON:  Portable chest 12/18/2022 FINDINGS: Again noted left chest dual lead pacing system with stable wire placements. Heart size and vasculature and the mediastinal configuration are normal. The lungs are clear. No new osseous findings. IMPRESSION: No active cardiopulmonary disease. Electronically Signed   By: Almira Bar M.D.   On: 12/29/2022 04:33        Scheduled Meds:  sodium chloride   Intravenous Once   atenolol  25 mg Oral Daily   Chlorhexidine Gluconate Cloth  6 each Topical Daily   citalopram  40 mg Oral Daily   clonazePAM  1 mg Oral BID   colchicine  0.6 mg Oral Daily   insulin aspart  0-5 Units Subcutaneous QHS   insulin aspart  0-6 Units Subcutaneous TID WC   methadone  150 mg Oral Daily   mupirocin ointment  1 Application Nasal BID   sodium chloride flush  3 mL Intravenous Q12H   Continuous Infusions:  sodium chloride Stopped (12/18/22 1346)   sodium chloride      ceFAZolin (ANCEF) IV 2 g (12/29/22 0202)     LOS: 11 days    Time spent: 45 minutes spent on chart review, discussion with nursing staff, consultants, updating family and interview/physical exam; more than 50% of that time was spent in counseling and/or coordination of  care.    Joseph Art, DO [facility] Available via Epic secure chat 7am-7pm After these hours, please refer to coverage provider listed on [email or web address] 12/29/2022, 5:25 PM

## 2022-12-29 NOTE — Progress Notes (Signed)
Came by to see patient but he had already left the floor, will check on after the OR. Marlin Canary DO

## 2022-12-29 NOTE — Progress Notes (Signed)
5 minutes after patient returned from cath lab, patient's chest dressing became saturated with blood after patient coughed. This RN held pressure x5 minutes and notified cath lab. Otilio Saber PA and Canary Brim NP came to bedside to assess dressing. Hinton Dyer, Consulting civil engineer, held an additional 5 minutes of pressure per instructions.

## 2022-12-29 NOTE — Progress Notes (Signed)
  Patient Name: Jeffrey [name] Date of Encounter: 12/29/2022  Primary Cardiologist: None Electrophysiologist: None  Interval Summary   NAEO. Planning for removal of PPM today.  Vital Signs    Vitals:   12/29/22 0720 12/29/22 0721 12/29/22 0722 12/29/22 0723  BP: (!) 117/59     Pulse: 60 (!) 59 (!) 57 60  Resp: 10 11 (!) 0 12  Temp:      TempSrc:      SpO2: 99% 100% 100% 99%  Weight:      Height:        Intake/Output Summary (Last 24 hours) at 12/29/2022 0823 Last data filed at 12/29/2022 0815 Gross per 24 hour  Intake 500 ml  Output 875 ml  Net -375 ml   Filed Weights   12/24/22 0341 12/25/22 0345 12/29/22 0637  Weight: 109.4 kg 107.9 kg 113.4 kg    Physical Exam    GEN- The patient is well appearing, alert and oriented x 3 today.   Lungs- Normal work of breathing Cardiac- Normal rate and regular rhythm. GI- soft, ND Extremities- no clubbing or cyanosis. No edema   Hospital Course     #CIED associated bacteremia #Staph aureus bacteremia #Sinus node dysfunction post permanent pacemaker Pacemaker implanted 31 years ago.  Dual-chamber permanent pacemaker.  RV lead has excess slack that prolapsed into the right ventricular outflow tract after implant.  I suspect it is adhered to the RVOT/RV free wall.  With the prolapse of slack, OHS may be required.  Plan for transvenous extraction +/- OHS today. Procedure reviewed in detail.  Risks, benefits, alternatives to PPM extraction were discussed in detail with the patient today. The patient understands that the risks include but are not limited to bleeding, infection, pneumothorax, perforation, tamponade, vascular damage, renal failure, MI, stroke, death, and severe bleeding requiring emergent open heart surgery.     For questions or updates, please contact [facility] Please consult [email or web address] for contact info under Cardiology/STEMI.  Signed, Rossie Muskrat. Lalla Brothers, MD, [facility], [facility] Cardiac  Electrophysiology 12/29/2022, 8:23 AM

## 2022-12-29 NOTE — Interval H&P Note (Signed)
History and Physical Interval Note:  12/29/2022 6:32 AM  Jeffrey [name]  has presented today for surgery, with the diagnosis of PPM LEAD VEGETATION.  The various methods of treatment have been discussed with the patient and family. After consideration of risks, benefits and other options for treatment, the patient has consented to  Procedure(s): MEDIAN STERNOTOMY (N/A) PACEMAKER LEAD REMOVAL AND EPICARDIAL DUAL-CHAMBER PACEMAKER SYSTEM INSERTION (N/A) TRANSESOPHAGEAL ECHOCARDIOGRAM (N/A) as a surgical intervention.  The patient's history has been reviewed, patient examined, no change in status, stable for surgery.  I have reviewed the patient's chart and labs.  Questions were answered to the patient's satisfaction.     [name]

## 2022-12-29 NOTE — Anesthesia Procedure Notes (Signed)
Central Venous Catheter Insertion Performed by: Shelton Silvas, MD, anesthesiologist Start/End10/09/2022 7:00 AM, 12/29/2022 7:10 AM Patient location: Pre-op. Preanesthetic checklist: patient identified, IV checked, site marked, risks and benefits discussed, surgical consent, monitors and equipment checked, pre-op evaluation, timeout performed and anesthesia consent Position: Trendelenburg Lidocaine 1% used for infiltration and patient sedated Hand hygiene performed , maximum sterile barriers used  and Seldinger technique used Catheter size: 8.5 Fr Total catheter length 10. Central line was placed.Sheath introducer Swan type:thermodilution PA Cath depth:50 Procedure performed using ultrasound guided technique. Ultrasound Notes:anatomy identified, needle tip was noted to be adjacent to the nerve/plexus identified, no ultrasound evidence of intravascular and/or intraneural injection and image(s) printed for medical record Attempts: 1 Following insertion, line sutured, dressing applied and Biopatch. Post procedure assessment: blood return through all ports, free fluid flow and no air  Patient tolerated the procedure well with no immediate complications.

## 2022-12-29 NOTE — Progress Notes (Signed)
TCTS   I provided surgical backup in the cath lab for removal of an infected pacemaker system for 2 hr and 36 minutes.  [name], MD

## 2022-12-29 NOTE — Plan of Care (Signed)

## 2022-12-29 NOTE — Anesthesia Procedure Notes (Signed)
Arterial Line Insertion Start/End10/09/2022 6:55 AM, 12/29/2022 7:03 AM Performed by: Nils Pyle, CRNA, CRNA  Patient location: Pre-op. Preanesthetic checklist: patient identified, IV checked, site marked, risks and benefits discussed, surgical consent, monitors and equipment checked, pre-op evaluation, timeout performed and anesthesia consent Lidocaine 1% used for infiltration Right, radial was placed Catheter size: 20 G Hand hygiene performed  and maximum sterile barriers used   Attempts: 1 Procedure performed without using ultrasound guided technique. Following insertion, dressing applied and Biopatch. Post procedure assessment: normal and unchanged  Patient tolerated the procedure well with no immediate complications.

## 2022-12-29 NOTE — Anesthesia Postprocedure Evaluation (Signed)
Anesthesia Post Note  Patient: Jeffrey [name]  Procedure(s) Performed: LEAD EXTRACTION TRANSESOPHAGEAL ECHOCARDIOGRAM     Patient location during evaluation: PACU Anesthesia Type: General Level of consciousness: awake and alert Pain management: pain level controlled Vital Signs Assessment: post-procedure vital signs reviewed and stable Respiratory status: spontaneous breathing, nonlabored ventilation, respiratory function stable and patient connected to nasal cannula oxygen Cardiovascular status: blood pressure returned to baseline and stable Postop Assessment: no apparent nausea or vomiting Anesthetic complications: no  There were no known notable events for this encounter.  Last Vitals:  Vitals:   12/29/22 1500 12/29/22 1600  BP: 120/65 114/64  Pulse: 73 69  Resp:  18  Temp:  36.7 C  SpO2: 96% 94%    Last Pain:  Vitals:   12/29/22 1600  TempSrc: Oral  PainSc:                  Shelton Silvas

## 2022-12-29 NOTE — Transfer of Care (Signed)
Immediate Anesthesia Transfer of Care Note  Patient: Jeffrey [name]  Procedure(s) Performed: LEAD EXTRACTION TRANSESOPHAGEAL ECHOCARDIOGRAM  Patient Location: Cath Lab  Anesthesia Type:General  Level of Consciousness: awake, alert , and oriented  Airway & Oxygen Therapy: Patient Spontanous Breathing and Patient connected to nasal cannula oxygen  Post-op Assessment: Report given to RN, Post -op Vital signs reviewed and stable, and Patient moving all extremities X 4  Post vital signs: Reviewed and stable  Last Vitals:  Vitals Value Taken Time  BP 148/53   Temp    Pulse 79 12/29/22 1152  Resp 17 12/29/22 1152  SpO2 98 % 12/29/22 1152  Vitals shown include unfiled device data.  Last Pain:  Vitals:   12/29/22 0651  TempSrc:   PainSc: 0-No pain      Patients Stated Pain Goal: 0 (12/28/22 2020)  Complications: There were no known notable events for this encounter.

## 2022-12-29 NOTE — Anesthesia Procedure Notes (Addendum)
Procedure Name: Intubation Date/Time: 12/29/2022 8:03 AM  Performed by: Nils Pyle, CRNAPre-anesthesia Checklist: Patient identified, Emergency Drugs available, Suction available, Patient being monitored and Timeout performed Patient Re-evaluated:Patient Re-evaluated prior to induction Oxygen Delivery Method: Circle system utilized Preoxygenation: Pre-oxygenation with 100% oxygen Induction Type: IV induction Ventilation: Mask ventilation without difficulty Laryngoscope Size: Mac and 4 Grade View: Grade I Tube type: Oral Tube size: 8.0 mm Number of attempts: 1 Airway Equipment and Method: Stylet Placement Confirmation: ETT inserted through vocal cords under direct vision, positive ETCO2, CO2 detector and breath sounds checked- equal and bilateral Secured at: 22 cm Tube secured with: Tape Dental Injury: Teeth and Oropharynx as per pre-operative assessment  Comments: Intubation by SRNA under direct supervision of CRNA and MDA.

## 2022-12-29 NOTE — Anesthesia Preprocedure Evaluation (Signed)
Anesthesia Evaluation    Reviewed: Allergy & Precautions, Patient's Chart, lab work & pertinent test results  Airway        Dental   Pulmonary neg pulmonary ROS          Cardiovascular hypertension, + pacemaker   Echo:  1. Left ventricular ejection fraction, by estimation, is 60 to 65%. The  left ventricle has normal function.   2. Right ventricular systolic function is normal. The right ventricular  size is normal.   3. No left atrial/left atrial appendage thrombus was detected.   4. The mitral valve is normal in structure. Trivial mitral valve  regurgitation.   5. The aortic valve is normal in structure. Aortic valve regurgitation is  not visualized.   6. There is mild (Grade II) plaque.      Neuro/Psych  PSYCHIATRIC DISORDERS Anxiety     negative neurological ROS     GI/Hepatic negative GI ROS, Neg liver ROS,,,  Endo/Other  diabetes    Renal/GU negative Renal ROS     Musculoskeletal negative musculoskeletal ROS (+)    Abdominal   Peds  Hematology negative hematology ROS (+)   Anesthesia Other Findings   Reproductive/Obstetrics                             Anesthesia Physical Anesthesia Plan  ASA: 4  Anesthesia Plan: General   Post-op Pain Management:    Induction: Intravenous  PONV Risk Score and Plan: 3 and Ondansetron, Dexamethasone and Midazolam  Airway Management Planned: Oral ETT  Additional Equipment: Arterial line, CVP, PA Cath, Ultrasound Guidance Line Placement and TEE  Intra-op Plan:   Post-operative Plan: Extubation in OR  Informed Consent:   Plan Discussed with: CRNA  Anesthesia Plan Comments:        Anesthesia Quick Evaluation

## 2022-12-30 ENCOUNTER — Inpatient Hospital Stay (HOSPITAL_COMMUNITY): Payer: MEDICAID

## 2022-12-30 DIAGNOSIS — D696 Thrombocytopenia, unspecified: Secondary | ICD-10-CM

## 2022-12-30 DIAGNOSIS — R7881 Bacteremia: Secondary | ICD-10-CM | POA: Diagnosis not present

## 2022-12-30 DIAGNOSIS — R509 Fever, unspecified: Principal | ICD-10-CM

## 2022-12-30 DIAGNOSIS — B9561 Methicillin susceptible Staphylococcus aureus infection as the cause of diseases classified elsewhere: Secondary | ICD-10-CM

## 2022-12-30 DIAGNOSIS — T827XXS Infection and inflammatory reaction due to other cardiac and vascular devices, implants and grafts, sequela: Secondary | ICD-10-CM | POA: Diagnosis not present

## 2022-12-30 LAB — GLUCOSE, CAPILLARY
Glucose-Capillary: 103 mg/dL — ABNORMAL HIGH (ref 70–99)
Glucose-Capillary: 129 mg/dL — ABNORMAL HIGH (ref 70–99)
Glucose-Capillary: 81 mg/dL (ref 70–99)
Glucose-Capillary: 84 mg/dL (ref 70–99)

## 2022-12-30 NOTE — Progress Notes (Signed)
Mobility Specialist Progress Note:    12/30/22 1500  Mobility  Activity Ambulated with assistance in hallway  Level of Assistance Contact guard assist, steadying assist  Assistive Device Front wheel walker  Distance Ambulated (ft) 80 ft  Range of Motion/Exercises Active;All extremities  Activity Response Tolerated well  Mobility Referral Yes  $Mobility charge 1 Mobility  Mobility Specialist Start Time (ACUTE ONLY) 1500  Mobility Specialist Stop Time (ACUTE ONLY) 1515  Mobility Specialist Time Calculation (min) (ACUTE ONLY) 15 min   Pt received in chair, agreeable to mobility session. Ambulated in hallway with RW and gait belt. Tolerated well, asx throughout. Returned pt to chair in room. All needs met, call bell in reach.   Jeffrey [name] Mobility Specialist Please contact via Special educational needs teacher or  Rehab office at [phone]

## 2022-12-30 NOTE — Plan of Care (Signed)

## 2022-12-30 NOTE — Progress Notes (Signed)
PROGRESS NOTE    Jeffrey [name]  ZOX:[id number] DOB: May 03, 1965 DOA: 12/18/2022 PCP: Tresa Garter, MD    Brief Narrative:  57 year old white male BMI 32 prior PTSD chronic BZD, opioid dependence previously on methadone.  Presented to the ED 9/25 fatigue nausea vomiting, SOB, Tmax 100.8 tachycardic slightly low blood pressure lactic acid 2.9-hypotension progressed to became septic started on Neo-Synephrine transferred to ICU.   BC ID MSSA. Tx to [facility] for TEE on 10/1.  10/7 went to cath lab for removal of pacemaker.  Plan per ID for abx.      Assessment and Plan: Sepsis secondary to infected pacemaker wires?  -ID consulted: Continuing abx per ID -s/p TEE on 10/1 -EP following: LHC: 10/3-- cath lab for removal of infected pacemaker on 10/7 with Lambert/Bartle -ortho consult: no current need for arthrocentesis- improved on IV Abx   Acute thrombocytopenia on admission likely from mild DIC  -trended up and now resolved   AKI on admission with hyponatremia/hypokalemia and metabolic acidosis -resolved  Long QTc previous PPM PPM removed as per cardiology 10/7   Obesity Estimated body mass index is 32.98 kg/m as calculated from the following:   Height as of this encounter: 6\' 1"  (1.854 m).   Weight as of this encounter: 113.4 kg.   Diabetes mellitus TY 2 D/c metformin -SSI only for now   HTN -d/c norvasc   Chronic pain Methadone dependence Continue methadone 150 has been on this for many many years- - continue Celexa 40 daily  Hypokalemia -repleted  DVT prophylaxis: SCDs Start: 12/18/22 0931    Code Status: Full Code   Disposition Plan:  Level of care: Progressive Cardiac Status is: Inpatient Remains inpatient appropriate --home in next 48-72 hours-- will need PICC line and abx at home  Consultants:  ID EP CVTS   Subjective: Feeling better today, no SOB, no CP  Objective: Vitals:   12/29/22 1500 12/29/22 1600 12/29/22 2032 12/30/22 0527  BP:  120/65 114/64 116/63 135/66  Pulse: 73 69 (!) 58 63  Resp:  18 18 18   Temp:  98.1 F (36.7 C) 98.1 F (36.7 C) 98.3 F (36.8 C)  TempSrc:  Oral Oral Oral  SpO2: 96% 94% 96% 96%  Weight:      Height:        Intake/Output Summary (Last 24 hours) at 12/30/2022 1254 Last data filed at 12/30/2022 0100 Gross per 24 hour  Intake 371.66 ml  Output --  Net 371.66 ml    Filed Weights   12/24/22 0341 12/25/22 0345 12/29/22 0637  Weight: 109.4 kg 107.9 kg 113.4 kg    Examination:   General: Appearance:    Obese male in no acute distress   Blood under wound  Lungs:      respirations unlabored  Heart:    Normal heart rate.    MS:   All extremities are intact.    Neurologic:   Awake, alert       Data Reviewed: I have personally reviewed following labs and imaging studies  CBC: Recent Labs  Lab 12/25/22 0352 12/26/22 0539 12/27/22 0500 12/28/22 0413 12/29/22 0811 12/29/22 0815 12/29/22 1429  WBC 12.5* 7.4 6.5 7.5  --   --  13.9*  HGB 11.9* 10.0* 10.0* 10.9* 10.5* 11.6* 11.0*  HCT 37.4* 31.3* 31.5* 34.5* 31.0* 34.0* 35.5*  MCV 89.9 88.4 88.5 87.8  --   --  88.5  PLT 166 135* 154 177  --   --  204  Basic Metabolic Panel: Recent Labs  Lab 12/25/22 0352 12/26/22 0500 12/26/22 0539 12/27/22 0500 12/28/22 0413 12/29/22 0811 12/29/22 0815 12/29/22 1429  NA 135  --  133* 135 135 137 137 134*  K 3.6  --  3.1* 3.4* 4.0 4.0 3.9 3.8  CL 102  --  99 100 102 101  --  100  CO2 23  --  24 24 25   --   --  19*  GLUCOSE 98  --  99 86 98 100*  --  137*  BUN 12  --  8 7 <5* 4*  --  6  CREATININE 1.09  --  0.88 1.01 0.87 0.70  --  1.06  CALCIUM 7.6*  --  7.1* 7.3* 7.6*  --   --  7.6*  MG  --  1.8  --   --  2.3  --   --   --    GFR: Estimated Creatinine Clearance: 99 mL/min (by C-G formula based on SCr of 1.06 mg/dL). Liver Function Tests: Recent Labs  Lab 12/24/22 0346 12/25/22 0352 12/26/22 0539  AST 43* 46* 37  ALT 14 13 12   ALKPHOS 93 104 85  BILITOT 1.2 1.0 0.9   PROT 5.7* 6.6 6.1*  ALBUMIN 2.1* 2.4* 2.2*   No results for input(s): "LIPASE", "AMYLASE" in the last 168 hours.  No results for input(s): "AMMONIA" in the last 168 hours. Coagulation Profile: Recent Labs  Lab 12/28/22 2327  INR 1.2    Cardiac Enzymes: No results for input(s): "CKTOTAL", "CKMB", "CKMBINDEX", "TROPONINI" in the last 168 hours. BNP (last 3 results) No results for input(s): "PROBNP" in the last 8760 hours. HbA1C: Recent Labs    12/28/22 2327  HGBA1C 10.7*   CBG: Recent Labs  Lab 12/29/22 0604 12/29/22 1627 12/29/22 2034 12/30/22 0746 12/30/22 1150  GLUCAP 116* 140* 148* 103* 129*   Lipid Profile: No results for input(s): "CHOL", "HDL", "LDLCALC", "TRIG", "CHOLHDL", "LDLDIRECT" in the last 72 hours. Thyroid Function Tests: No results for input(s): "TSH", "T4TOTAL", "FREET4", "T3FREE", "THYROIDAB" in the last 72 hours. Anemia Panel: No results for input(s): "VITAMINB12", "FOLATE", "FERRITIN", "TIBC", "IRON", "RETICCTPCT" in the last 72 hours. Sepsis Labs: No results for input(s): "PROCALCITON", "LATICACIDVEN" in the last 168 hours.   Recent Results (from the past 240 hour(s))  Surgical pcr screen     Status: Abnormal   Collection Time: 12/28/22 11:36 PM   Specimen: Nasal Mucosa; Nasal Swab  Result Value Ref Range Status   MRSA, PCR NEGATIVE NEGATIVE Final   Staphylococcus aureus POSITIVE (A) NEGATIVE Final    Comment: (NOTE) The Xpert SA Assay (FDA approved for NASAL specimens in patients 28 years of age and older), is one component of a comprehensive surveillance program. It is not intended to diagnose infection nor to guide or monitor treatment. Performed at [facility], [address] [place], [place] [address]          Radiology Studies: DG Chest 2 View  Result Date: 12/30/2022 CLINICAL DATA:  Pacemaker infection EXAM: CHEST - 2 VIEW COMPARISON:  12/29/2022 FINDINGS: Frontal and lateral views of the chest demonstrates  interval removal of the dual lead pacemaker. No evidence of retained foreign body. Cardiac silhouette is unremarkable. No airspace disease, effusion, or pneumothorax. No acute bony abnormalities. IMPRESSION: 1. No complication after interval removal of dual lead pacemaker. 2. No acute intrathoracic process. Electronically Signed   By: Sharlet Salina M.D.   On: 12/30/2022 09:42   EP PPM/ICD IMPLANT  Result Date: 12/29/2022  CONCLUSIONS:  1.  Sinus node dysfunction with permanent pacemaker in situ  2.  CIED associated bacteremia and septic shock now post pacemaker system extraction  3.  No early apparent complications.   EP STUDY  Result Date: 12/29/2022  CONCLUSIONS:  1.  Sinus node dysfunction with permanent pacemaker in situ  2.  CIED associated bacteremia and septic shock now post pacemaker system extraction  3.  No early apparent complications.   DG Chest 2 View  Result Date: 12/29/2022 CLINICAL DATA:  [id number], preoperative chest exam. EXAM: CHEST - 2 VIEW COMPARISON:  Portable chest 12/18/2022 FINDINGS: Again noted left chest dual lead pacing system with stable wire placements. Heart size and vasculature and the mediastinal configuration are normal. The lungs are clear. No new osseous findings. IMPRESSION: No active cardiopulmonary disease. Electronically Signed   By: Almira Bar M.D.   On: 12/29/2022 04:33        Scheduled Meds:  sodium chloride   Intravenous Once   atenolol  25 mg Oral Daily   Chlorhexidine Gluconate Cloth  6 each Topical Daily   citalopram  40 mg Oral Daily   clonazePAM  1 mg Oral BID   colchicine  0.6 mg Oral Daily   insulin aspart  0-5 Units Subcutaneous QHS   insulin aspart  0-6 Units Subcutaneous TID WC   methadone  150 mg Oral Daily   mupirocin ointment  1 Application Nasal BID   sodium chloride flush  3 mL Intravenous Q12H   Continuous Infusions:  sodium chloride      ceFAZolin (ANCEF) IV 2 g (12/30/22 0924)     LOS: 12 days    Time spent: 45  minutes spent on chart review, discussion with nursing staff, consultants, updating family and interview/physical exam; more than 50% of that time was spent in counseling and/or coordination of care.    Joseph Art, DO [facility] Available via Epic secure chat 7am-7pm After these hours, please refer to coverage provider listed on [email or web address] 12/30/2022, 12:54 PM

## 2022-12-30 NOTE — Progress Notes (Signed)
  Patient Name: Jeffrey [name] Date of Encounter: 12/30/2022  Primary Cardiologist: None Electrophysiologist: None  Interval Summary   The patient is doing well today.  At this time, the patient denies chest pain, shortness of breath, or any new concerns.  Vital Signs    Vitals:   12/29/22 1500 12/29/22 1600 12/29/22 2032 12/30/22 0527  BP: 120/65 114/64 116/63 135/66  Pulse: 73 69 (!) 58 63  Resp:  18 18 18   Temp:  98.1 F (36.7 C) 98.1 F (36.7 C) 98.3 F (36.8 C)  TempSrc:  Oral Oral Oral  SpO2: 96% 94% 96% 96%  Weight:      Height:        Intake/Output Summary (Last 24 hours) at 12/30/2022 1302 Last data filed at 12/30/2022 0100 Gross per 24 hour  Intake 371.66 ml  Output --  Net 371.66 ml   Filed Weights   12/24/22 0341 12/25/22 0345 12/29/22 0637  Weight: 109.4 kg 107.9 kg 113.4 kg    Physical Exam    GEN- The patient is well appearing, alert and oriented x 3 today.   Neck: left neck suture removed without difficulty Lungs- Clear to ausculation bilaterally, normal work of breathing Cardiac- Regular rate and rhythm, no murmurs, rubs or gallops GI- soft, NT, ND, + BS Extremities- no clubbing or cyanosis. No edema  Telemetry    SB 50's-60's (personally reviewed)   Radiology/Studies: CT Chest/ABD/Pelvis 9/26 > no acute findings  CT R Knee 9/30 > questionable myositis  TEE 10/1 > LVEF 60-65%, small strand coming from the RA lead > fibrinous material vs vegetation  LHC 10/3 > negative for CAD     DEVICE HISTORY:  Abbott Dual Chamber > initial implant 1993 at [facility], generator change 09/25/2004, generator change 2019. Atrial lead MDT 5524, RV lead MDT 4058   MICRO: BCx2 9/26 > MSSA  BCx2 9/27 > Negative BCx2 10/8 >   Hospital Course    MUHANNAD BIGNELL is a 57 y.o. male with a history of long QT s/p PPM at [facility] > 63yrs ago, opioid dependence, PTSD, substance abuse, poorly controlled DM with right foot ulceration (followed by Podiatry) who was  admitted 9/26 with reports of fatigue, nausea, vomiting, SOB. He was found to have septic shock requiring vasopressors, AKI, & thrombocytopenia. CT chest/abd/pelvis was negative for acute findings. R knee CT was with questionable myositis. Blood cultures grew MSSA. The patient underwent TEE 10/1 which showed fibrinous material on RA lead. ID following for sepsis / CIED infection. LHC 10/3 negative for CAD. S/p extraction of leads and device 12/29/22.   Assessment & Plan    CIED  MSSA Bacteremia  TEE with fibrinous material on right atrial lead. 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 Leads >39 years old.  LHC negative for CAD. S/p extraction of leads and device 12/29/22.  -appreciate ID  -continue cefazolin per ID -follow repeat blood cultures  -avoid device reimplant unless declares otherwise   Long QT Syndrome: S/p dual PPM in 1993, Known loop in RV lead, unchanged in last 10 years.  -avoid QT prolonging agents   HTN: -BP/HR stable on reduced dose atenolol (25mg  - initially reduced to assess pacing burden pre-procedure)  -continue norvasc      For questions or updates, please contact [facility] Please consult [email or web address] for contact info under Cardiology/STEMI.  Signed, Canary Brim, MSN, APRN, NP-C, AGACNP-BC [facility] - Electrophysiology  12/30/2022, 1:10 PM

## 2022-12-30 NOTE — Evaluation (Addendum)
Physical Therapy Evaluation Patient Details Name: Jeffrey [name] MRN: [id number] DOB: Nov 29, 1965 Today's Date: 12/30/2022  History of Present Illness  Patient is a 57 year old male who presented to the hospital on 9/25 with fevers and profound weakness. Patient was admitted with hypotension with pressors started, septic shock secondary to MSSA bacteremia, AKI. Now s/p removal of hardware and TEE. 10/3 R radial catheter. PMH: opioid dependence, diabetes, anxiety, PTSD.   Clinical Impression  Pt in bed upon arrival and agreeable to PT re-eval after removal of hardware and TEE. Pt presents to therapy session below functional mobility baseline with decreased LE strength and decreased functional mobility. Pt was able to stand and ambulate ~80 ft with CGA and RW. Pt would benefit from acute skilled PT to address functional impairments. Recommending post-acute HHPT to help pt regain independence. Acute PT to follow.          If plan is discharge home, recommend the following: A little help with walking and/or transfers;A little help with bathing/dressing/bathroom;Assist for transportation;Assistance with cooking/housework   Can travel by private vehicle   Yes    Equipment Recommendations Rolling walker (2 wheels)     Functional Status Assessment Patient has had a recent decline in their functional status and demonstrates the ability to make significant improvements in function in a reasonable and predictable amount of time.     Precautions / Restrictions Precautions Precautions: Fall Precaution Comments: chronic rt knee problems/pain Restrictions Weight Bearing Restrictions: Yes RUE Weight Bearing:  (no push,pull,lift >5#) Other Position/Activity Restrictions: R radial catheter 10/3     Mobility  Bed Mobility Overal bed mobility: Needs Assistance Bed Mobility: Rolling, Sidelying to Sit Rolling: Min assist Sidelying to sit: Contact guard assist       General bed mobility  comments: Cues for pushing with L UE to sit upright due to R radial cath 10/7.  MinA for to assist elevation of trunk.    Transfers Overall transfer level: Needs assistance Equipment used: Rolling walker (2 wheels) Transfers: Sit to/from Stand Sit to Stand: Contact guard assist           General transfer comment: able to stand with arms resting across chest to avoid pushing with R UE    Ambulation/Gait Ambulation/Gait assistance: Contact guard assist Gait Distance (Feet): 80 Feet Assistive device: Rolling walker (2 wheels) Gait Pattern/deviations: Step-through pattern, Decreased stride length, Trunk flexed Gait velocity: dec     General Gait Details: cues to stay close to RW, CGA for safety. Cues to not WB through R UE        Balance Overall balance assessment: Needs assistance Sitting-balance support: No upper extremity supported, Feet supported Sitting balance-Leahy Scale: Good     Standing balance support: Bilateral upper extremity supported, During functional activity, Reliant on assistive device for balance Standing balance-Leahy Scale: Poor Standing balance comment: reliant on RW for stability          Pertinent Vitals/Pain Pain Assessment Pain Assessment: Faces Faces Pain Scale: Hurts a little bit Pain Location: R knee Pain Descriptors / Indicators: Aching Pain Intervention(s): Limited activity within patient's tolerance, Monitored during session, Repositioned    Home Living Family/patient expects to be discharged to:: Private residence Living Arrangements: Parent   Type of Home: House Home Access: Stairs to enter   Secretary/administrator of Steps: 1   Home Layout: One level Home Equipment: None Additional Comments: patient reported living at home with parents who he helps take care of. patient reported having a knee  brace for R knee that he wore if he was doing extranious work. 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    Prior Function Prior Level of  Function : Independent/Modified Independent            Extremity/Trunk Assessment   Upper Extremity Assessment Upper Extremity Assessment: Defer to OT evaluation    Lower Extremity Assessment Lower Extremity Assessment: Generalized weakness       Communication   Communication Communication: No apparent difficulties  Cognition Arousal: Alert Behavior During Therapy: WFL for tasks assessed/performed Overall Cognitive Status: Within Functional Limits for tasks assessed            General Comments: Educated pt on precautions with R UE and sternal precautions for comfort with mobility        General Comments General comments (skin integrity, edema, etc.): VSS on RA           PT Assessment Patient needs continued PT services  PT Problem List Decreased strength;Decreased range of motion;Decreased balance;Decreased mobility;Decreased knowledge of use of DME;Pain;Decreased activity tolerance       PT Treatment Interventions DME instruction;Gait training;Balance training;Functional mobility training;Therapeutic activities;Therapeutic exercise;Patient/family education    PT Goals (Current goals can be found in the Care Plan section)  Acute Rehab PT Goals Patient Stated Goal: to get better PT Goal Formulation: With patient Time For Goal Achievement: 01/13/23 Potential to Achieve Goals: Good    Frequency Min 1X/week     Co-evaluation PT/OT/SLP Co-Evaluation/Treatment: Yes Reason for Co-Treatment: To address functional/ADL transfers;Complexity of the patient's impairments (multi-system involvement) PT goals addressed during session: Mobility/safety with mobility         AM-PAC PT "6 Clicks" Mobility  Outcome Measure Help needed turning from your back to your side while in a flat bed without using bedrails?: A Little Help needed moving from lying on your back to sitting on the side of a flat bed without using bedrails?: A Little Help needed moving to and from a  bed to a chair (including a wheelchair)?: A Little Help needed standing up from a chair using your arms (e.g., wheelchair or bedside chair)?: A Little Help needed to walk in hospital room?: A Little Help needed climbing 3-5 steps with a railing? : A Lot 6 Click Score: 17    End of Session Equipment Utilized During Treatment: Gait belt Activity Tolerance: Patient tolerated treatment well Patient left: in chair;with call bell/phone within reach;with chair alarm set Nurse Communication: Mobility status PT Visit Diagnosis: Difficulty in walking, not elsewhere classified (R26.2);Other abnormalities of gait and mobility (R26.89)    Time: 4098-1191 PT Time Calculation (min) (ACUTE ONLY): 19 min   Charges:   PT Evaluation $PT Re-evaluation: 1 Re-eval   PT General Charges $$ ACUTE PT VISIT: 1 Visit         Hilton Cork, PT, DPT Secure Chat Preferred  Rehab Office [phone]   Arturo Morton Brion Aliment 12/30/2022, 10:37 AM

## 2022-12-30 NOTE — Progress Notes (Signed)
Subjective: No new complaints   Antibiotics:  Anti-infectives (From admission, onward)    Start     Dose/Rate Route Frequency Ordered Stop   12/29/22 1112  gentamicin (GARAMYCIN) 80 mg in sodium chloride 0.9 % 500 mL irrigation  Status:  Discontinued          As needed 12/29/22 1112 12/29/22 1319   12/29/22 0747  sodium chloride 0.9 % with gentamicin (GARAMYCIN) ADS Med       Note to Pharmacy: Jeffrey [name] J: cabinet override      12/29/22 0747 12/29/22 1446   12/29/22 0400  ceFAZolin (ANCEF) IVPB 2g/100 mL premix  Status:  Discontinued        2 g 200 mL/hr over 30 Minutes Intravenous To Surgery 12/28/22 2024 12/29/22 1333   12/29/22 0400  ceFAZolin (ANCEF) IVPB 2g/100 mL premix  Status:  Discontinued        2 g 200 mL/hr over 30 Minutes Intravenous To Surgery 12/28/22 1833 12/29/22 1333   12/29/22 0400  vancomycin (VANCOREADY) IVPB 1500 mg/300 mL        1,500 mg 150 mL/hr over 120 Minutes Intravenous  Once 12/28/22 2011 12/29/22 0915   12/19/22 0000  vancomycin (VANCOCIN) IVPB 1000 mg/200 mL premix  Status:  Discontinued        1,000 mg 200 mL/hr over 60 Minutes Intravenous Every 12 hours 12/18/22 0952 12/18/22 1804   12/18/22 2200  ceFAZolin (ANCEF) IVPB 2g/100 mL premix        2 g 200 mL/hr over 30 Minutes Intravenous Every 8 hours 12/18/22 1804     12/18/22 1000  metroNIDAZOLE (FLAGYL) IVPB 500 mg  Status:  Discontinued        500 mg 100 mL/hr over 60 Minutes Intravenous Every 12 hours 12/18/22 0930 12/18/22 0952   12/18/22 1000  vancomycin (VANCOREADY) IVPB 2000 mg/400 mL        2,000 mg 200 mL/hr over 120 Minutes Intravenous  Once 12/18/22 0952 12/18/22 1229   12/18/22 1000  meropenem (MERREM) 1 g in sodium chloride 0.9 % 100 mL IVPB  Status:  Discontinued        1 g 200 mL/hr over 30 Minutes Intravenous Every 8 hours 12/18/22 0952 12/18/22 1804   12/18/22 0445  aztreonam (AZACTAM) 1 g in sodium chloride 0.9 % 100 mL IVPB        1 g 200 mL/hr over 30  Minutes Intravenous  Once 12/18/22 0431 12/18/22 0553       Medications: Scheduled Meds:  sodium chloride   Intravenous Once   atenolol  25 mg Oral Daily   Chlorhexidine Gluconate Cloth  6 each Topical Daily   citalopram  40 mg Oral Daily   clonazePAM  1 mg Oral BID   colchicine  0.6 mg Oral Daily   insulin aspart  0-5 Units Subcutaneous QHS   insulin aspart  0-6 Units Subcutaneous TID WC   methadone  150 mg Oral Daily   mupirocin ointment  1 Application Nasal BID   sodium chloride flush  3 mL Intravenous Q12H   Continuous Infusions:  sodium chloride Stopped (12/18/22 1346)   sodium chloride      ceFAZolin (ANCEF) IV 2 g (12/30/22 0216)   PRN Meds:.Place/Maintain arterial line **AND** sodium chloride, acetaminophen **OR** acetaminophen, albuterol, mouth rinse, sodium chloride flush, traZODone    Objective: Weight change:   Intake/Output Summary (Last 24 hours) at 12/30/2022 4098 Last data filed at 12/30/2022 0100  Gross per 24 hour  Intake 1371.66 ml  Output 500 ml  Net 871.66 ml   Blood pressure 135/66, pulse 63, temperature 98.3 F (36.8 C), temperature source Oral, resp. rate 18, height 6\' 1"  (1.854 m), weight 113.4 kg, SpO2 96%. Temp:  [98 F (36.7 C)-98.3 F (36.8 C)] 98.3 F (36.8 C) (10/08 0527) Pulse Rate:  [58-82] 63 (10/08 0527) Resp:  [15-28] 18 (10/08 0527) BP: (109-135)/(55-75) 135/66 (10/08 0527) SpO2:  [92 %-99 %] 96 % (10/08 0527)  Physical Exam: Physical Exam Constitutional:      Appearance: He is well-developed.  HENT:     Head: Normocephalic and atraumatic.  Eyes:     Conjunctiva/sclera: Conjunctivae normal.  Cardiovascular:     Rate and Rhythm: Normal rate and regular rhythm.  Pulmonary:     Effort: Pulmonary effort is normal. No respiratory distress.     Breath sounds: No wheezing.  Abdominal:     General: There is no distension.     Palpations: Abdomen is soft.  Musculoskeletal:        General: Normal range of motion.     Cervical  back: Normal range of motion and neck supple.  Skin:    General: Skin is warm and dry.     Findings: No erythema or rash.  Neurological:     General: No focal deficit present.     Mental Status: He is alert and oriented to person, place, and time.  Psychiatric:        Mood and Affect: Mood normal.        Behavior: Behavior normal.        Thought Content: Thought content normal.        Judgment: Judgment normal.      CBC:    BMET Recent Labs    12/28/22 0413 12/29/22 0811 12/29/22 0815 12/29/22 1429  NA 135 137 137 134*  K 4.0 4.0 3.9 3.8  CL 102 101  --  100  CO2 25  --   --  19*  GLUCOSE 98 100*  --  137*  BUN <5* 4*  --  6  CREATININE 0.87 0.70  --  1.06  CALCIUM 7.6*  --   --  7.6*     Liver Panel  No results for input(s): "PROT", "ALBUMIN", "AST", "ALT", "ALKPHOS", "BILITOT", "BILIDIR", "IBILI" in the last 72 hours.     Sedimentation Rate No results for input(s): "ESRSEDRATE" in the last 72 hours. C-Reactive Protein No results for input(s): "CRP" in the last 72 hours.  Micro Results: Recent Results (from the past 720 hour(s))  Resp panel by RT-PCR (RSV, Flu A&B, Covid) Anterior Nasal Swab     Status: None   Collection Time: 12/18/22  2:39 AM   Specimen: Anterior Nasal Swab  Result Value Ref Range Status   SARS Coronavirus 2 by RT PCR NEGATIVE NEGATIVE Final    Comment: (NOTE) SARS-CoV-2 target nucleic acids are NOT DETECTED.  The SARS-CoV-2 RNA is generally detectable in upper respiratory specimens during the acute phase of infection. The lowest concentration of SARS-CoV-2 viral copies this assay can detect is 138 copies/mL. A negative result does not preclude SARS-Cov-2 infection and should not be used as the sole basis for treatment or other patient management decisions. A negative result may occur with  improper specimen collection/handling, submission of specimen other than nasopharyngeal swab, presence of viral mutation(s) within the areas  targeted by this assay, and inadequate number of viral copies(<138 copies/mL). A negative result  must be combined with clinical observations, patient history, and epidemiological information. The expected result is Negative.  Fact Sheet for Patients:  BloggerCourse.com  Fact Sheet for Healthcare Providers:  SeriousBroker.it  This test is no t yet approved or cleared by the [place] FDA and  has been authorized for detection and/or diagnosis of SARS-CoV-2 by FDA under an Emergency Use Authorization (EUA). This EUA will remain  in effect (meaning this test can be used) for the duration of the COVID-19 declaration under Section 564(b)(1) of the Act, 21 U.S.C.section 360bbb-3(b)(1), unless the authorization is terminated  or revoked sooner.       Influenza A by PCR NEGATIVE NEGATIVE Final   Influenza B by PCR NEGATIVE NEGATIVE Final    Comment: (NOTE) The Xpert Xpress SARS-CoV-2/FLU/RSV plus assay is intended as an aid in the diagnosis of influenza from Nasopharyngeal swab specimens and should not be used as a sole basis for treatment. Nasal washings and aspirates are unacceptable for Xpert Xpress SARS-CoV-2/FLU/RSV testing.  Fact Sheet for Patients: BloggerCourse.com  Fact Sheet for Healthcare Providers: SeriousBroker.it  This test is not yet approved or cleared by the [place] FDA and has been authorized for detection and/or diagnosis of SARS-CoV-2 by FDA under an Emergency Use Authorization (EUA). This EUA will remain in effect (meaning this test can be used) for the duration of the COVID-19 declaration under Section 564(b)(1) of the Act, 21 U.S.C. section 360bbb-3(b)(1), unless the authorization is terminated or revoked.     Resp Syncytial Virus by PCR NEGATIVE NEGATIVE Final    Comment: (NOTE) Fact Sheet for  Patients: BloggerCourse.com  Fact Sheet for Healthcare Providers: SeriousBroker.it  This test is not yet approved or cleared by the [place] FDA and has been authorized for detection and/or diagnosis of SARS-CoV-2 by FDA under an Emergency Use Authorization (EUA). This EUA will remain in effect (meaning this test can be used) for the duration of the COVID-19 declaration under Section 564(b)(1) of the Act, 21 U.S.C. section 360bbb-3(b)(1), unless the authorization is terminated or revoked.  Performed at [facility], [address], [place], [place] [address]   Culture, blood (Routine x 2)     Status: Abnormal   Collection Time: 12/18/22  2:41 AM   Specimen: BLOOD RIGHT FOREARM  Result Value Ref Range Status   Specimen Description   Final    BLOOD RIGHT FOREARM Performed at [facility], [address] [place], [place] [address]    Special Requests   Final    BOTTLES DRAWN AEROBIC AND ANAEROBIC Blood Culture results may not be optimal due to an excessive volume of blood received in culture bottles Performed at [facility], [address] [place], [place] [address]    Culture  Setup Time   Final    GRAM POSITIVE COCCI IN CLUSTERS IN BOTH AEROBIC AND ANAEROBIC BOTTLES CRITICAL VALUE NOTED.  VALUE IS CONSISTENT WITH PREVIOUSLY REPORTED AND CALLED VALUE.    Culture (A)  Final    STAPHYLOCOCCUS AUREUS SUSCEPTIBILITIES PERFORMED ON PREVIOUS CULTURE WITHIN THE LAST 5 DAYS. Performed at [facility], [address] [place], [place] [address]    Report Status 12/20/2022 FINAL  Final  Culture, blood (Routine x 2)     Status: Abnormal   Collection Time: 12/18/22  2:49 AM   Specimen: BLOOD LEFT FOREARM  Result Value Ref Range Status   Specimen Description   Final    BLOOD LEFT FOREARM Performed at [facility], [address], [place], [place] [address]    Special  Requests   Final    BOTTLES DRAWN AEROBIC AND ANAEROBIC Blood Culture results may not be optimal due to an excessive volume of blood received in culture bottles Performed at [facility], [address] [place], [place] [address]    Culture  Setup Time   Final    GRAM POSITIVE COCCI IN CLUSTERS IN BOTH AEROBIC AND ANAEROBIC BOTTLES CRITICAL RESULT CALLED TO, READ BACK BY AND VERIFIED WITH: PHARMD C. Clearance Coots [id number] @1714  FH Performed at [facility], [address], [place], [place] [address]    Culture STAPHYLOCOCCUS AUREUS (A)  Final   Report Status 12/20/2022 FINAL  Final   Organism ID, Bacteria STAPHYLOCOCCUS AUREUS  Final      Susceptibility   Staphylococcus aureus - MIC*    CIPROFLOXACIN >=8 RESISTANT Resistant     ERYTHROMYCIN >=8 RESISTANT Resistant     GENTAMICIN <=0.5 SENSITIVE Sensitive     OXACILLIN 0.5 SENSITIVE Sensitive     TETRACYCLINE <=1 SENSITIVE Sensitive     VANCOMYCIN <=0.5 SENSITIVE Sensitive     TRIMETH/SULFA >=320 RESISTANT Resistant     CLINDAMYCIN <=0.25 SENSITIVE Sensitive     RIFAMPIN <=0.5 SENSITIVE Sensitive     Inducible Clindamycin NEGATIVE Sensitive     LINEZOLID 2 SENSITIVE Sensitive     * STAPHYLOCOCCUS AUREUS  Blood Culture ID Panel (Reflexed)     Status: Abnormal   Collection Time: 12/18/22  2:49 AM  Result Value Ref Range Status   Enterococcus faecalis NOT DETECTED NOT DETECTED Final   Enterococcus Faecium NOT DETECTED NOT DETECTED Final   Listeria monocytogenes NOT DETECTED NOT DETECTED Final   Staphylococcus species DETECTED (A) NOT DETECTED Final    Comment: CRITICAL RESULT CALLED TO, READ BACK BY AND VERIFIED WITH: PHARMD C. SHADE [id number] @1714  FH    Staphylococcus aureus (BCID) DETECTED (A) NOT DETECTED Final    Comment: CRITICAL RESULT CALLED TO, READ BACK BY AND VERIFIED WITH: PHARMD C. SHADE [id number] @1714  FH    Staphylococcus epidermidis NOT DETECTED NOT DETECTED Final   Staphylococcus lugdunensis NOT  DETECTED NOT DETECTED Final   Streptococcus species NOT DETECTED NOT DETECTED Final   Streptococcus agalactiae NOT DETECTED NOT DETECTED Final   Streptococcus pneumoniae NOT DETECTED NOT DETECTED Final   Streptococcus pyogenes NOT DETECTED NOT DETECTED Final   A.calcoaceticus-baumannii NOT DETECTED NOT DETECTED Final   Bacteroides fragilis NOT DETECTED NOT DETECTED Final   Enterobacterales NOT DETECTED NOT DETECTED Final   Enterobacter cloacae complex NOT DETECTED NOT DETECTED Final   Escherichia coli NOT DETECTED NOT DETECTED Final   Klebsiella aerogenes NOT DETECTED NOT DETECTED Final   Klebsiella oxytoca NOT DETECTED NOT DETECTED Final   Klebsiella pneumoniae NOT DETECTED NOT DETECTED Final   Proteus species NOT DETECTED NOT DETECTED Final   Salmonella species NOT DETECTED NOT DETECTED Final   Serratia marcescens NOT DETECTED NOT DETECTED Final   Haemophilus influenzae NOT DETECTED NOT DETECTED Final   Neisseria meningitidis NOT DETECTED NOT DETECTED Final   Pseudomonas aeruginosa NOT DETECTED NOT DETECTED Final   Stenotrophomonas maltophilia NOT DETECTED NOT DETECTED Final   Candida albicans NOT DETECTED NOT DETECTED Final   Candida auris NOT DETECTED NOT DETECTED Final   Candida glabrata NOT DETECTED NOT DETECTED Final   Candida krusei NOT DETECTED NOT DETECTED Final   Candida parapsilosis NOT DETECTED NOT DETECTED Final   Candida tropicalis NOT DETECTED NOT DETECTED Final  Cryptococcus neoformans/gattii NOT DETECTED NOT DETECTED Final   Meth resistant mecA/C and MREJ NOT DETECTED NOT DETECTED Final    Comment: Performed at [facility], [address] [place], [place] [address]  MRSA Next Gen by PCR, Nasal     Status: None   Collection Time: 12/18/22  1:01 PM   Specimen: Nasal Mucosa; Nasal Swab  Result Value Ref Range Status   MRSA by PCR Next Gen NOT DETECTED NOT DETECTED Final    Comment: (NOTE) The GeneXpert MRSA Assay (FDA approved for NASAL specimens only), is  one component of a comprehensive MRSA colonization surveillance program. It is not intended to diagnose MRSA infection nor to guide or monitor treatment for MRSA infections. Test performance is not FDA approved in patients less than 92 years old. Performed at [facility], [address] [place], [place] [address]   Culture, blood (Routine X 2) w Reflex to ID Panel     Status: None   Collection Time: 12/19/22  3:33 AM   Specimen: BLOOD RIGHT HAND  Result Value Ref Range Status   Specimen Description   Final    BLOOD RIGHT HAND Performed at [facility], [address], [place], [place] [address]    Special Requests   Final    BOTTLES DRAWN AEROBIC AND ANAEROBIC Blood Culture adequate volume Performed at [facility], [address], [place], [place] [address]    Culture   Final    NO GROWTH 5 DAYS Performed at [facility], [address] [place], [place] [address]    Report Status 12/24/2022 FINAL  Final  Culture, blood (Routine X 2) w Reflex to ID Panel     Status: None   Collection Time: 12/19/22  3:33 AM   Specimen: BLOOD RIGHT FOREARM  Result Value Ref Range Status   Specimen Description   Final    BLOOD RIGHT FOREARM Performed at [facility], [address], [place], [place] [address]    Special Requests   Final    BOTTLES DRAWN AEROBIC AND ANAEROBIC Blood Culture adequate volume Performed at [facility], [address], [place], [place] [address]    Culture   Final    NO GROWTH 5 DAYS Performed at [facility], [address], [place], [place] [address]    Report Status 12/24/2022 FINAL  Final  Surgical pcr screen     Status: Abnormal   Collection Time: 12/28/22 11:36 PM   Specimen: Nasal Mucosa; Nasal Swab  Result Value Ref Range Status   MRSA, PCR NEGATIVE NEGATIVE Final   Staphylococcus aureus POSITIVE (A) NEGATIVE Final    Comment: (NOTE) The Xpert SA Assay (FDA approved for  NASAL specimens in patients 75 years of age and older), is one component of a comprehensive surveillance program. It is not intended to diagnose infection nor to guide or monitor treatment. Performed at [facility], [address], [place], [place] [address]     Studies/Results: EP PPM/ICD IMPLANT  Result Date: 12/29/2022  CONCLUSIONS:  1.  Sinus node dysfunction with permanent pacemaker in situ  2.  CIED associated bacteremia and septic shock now post pacemaker system extraction  3.  No early apparent complications.   EP STUDY  Result Date: 12/29/2022  CONCLUSIONS:  1.  Sinus node dysfunction with permanent pacemaker in situ  2.  CIED associated bacteremia and septic shock now post pacemaker system extraction  3.  No early apparent complications.   DG Chest 2 View  Result Date: 12/29/2022 CLINICAL DATA:  [id number], preoperative chest exam. EXAM: CHEST - 2 VIEW COMPARISON:  Portable chest 12/18/2022 FINDINGS: Again noted left chest dual lead pacing system with stable wire placements. Heart size and vasculature and the mediastinal configuration are normal. The lungs are clear. No new osseous findings. IMPRESSION: No active cardiopulmonary disease. Electronically Signed   By: Almira Bar M.D.   On: 12/29/2022 04:33      Assessment/Plan:  INTERVAL HISTORY: Pacemaker successfully extracted   Principal Problem:   Sepsis (HCC) Active Problems:   Shock (HCC)   Hypovolemia   Bacteremia   Pacemaker infection (HCC)   Acute pain of right knee    Jeffrey [name] is a 57 y.o. male with  MSSA bacteremia and pacemaker infection sp extraction. He also did have several lines in before extraction   #1 MSSA bacteremia with cardiac device infection sp extraction  ---I ordered a set of blood cultures today post device extraction to reassure ourselves that we have cleared this given device just extracted and lines now out  I told pt maybe PICC tomorrow reviewing it further I think  we should wait a few days to make sure we have negative cultures times several days  Then treat for 6 weeks with cefazolin  I have personally spent 54 minutes involved in face-to-face and non-face-to-face activities for this patient on the day of the visit. Professional time spent includes the following activities: Preparing to see the patient (review of tests), Obtaining and/or reviewing separately obtained history (admission/discharge record), Performing a medically appropriate examination and/or evaluation , Ordering medications/tests/procedures, referring and communicating with other health care professionals, Documenting clinical information in the EMR, Independently interpreting results (not separately reported), Communicating results to the patient/family/caregiver, Counseling and educating the patient/family/caregiver and Care coordination (not separately reported).     LOS: 12 days   Acey Lav 12/30/2022, 9:18 AM

## 2022-12-30 NOTE — Progress Notes (Signed)
Occupational Therapy Treatment Patient Details Name: Jeffrey [name] MRN: [id number] DOB: 08-16-1965 Today's Date: 12/30/2022   History of present illness Patient is a 57 year old male who presented to the hospital on 9/25 with fevers and profound weakness. Patient was admitted with hypotension with pressors started, septic shock secondary to MSSA bacteremia, AKI. Now s/p removal of hardware and TEE  PMH: opioid dependence, diabetes, anxiety, PTSD.   OT comments  Pt not s/p lead extraction and TEE yesterday. Pt educated regarding precautions and safety s/p above procedures. Pt performing basic mobility with CGA this session with RW for steadying. Pt able to doff socks sitting in recliner with use of figure 4 position. Limited by poor activity tolerance and decreased strength. Due to continued progression with mobility after procedure, updated discharge disposition to home with HHOT to optimize safety and independence in the home environment.       If plan is discharge home, recommend the following:  A little help with walking and/or transfers;Assistance with cooking/housework;Direct supervision/assist for medications management;Direct supervision/assist for financial management;Assist for transportation;Help with stairs or ramp for entrance;A little help with bathing/dressing/bathroom   Equipment Recommendations  BSC/3in1;Other (comment) (RW)    Recommendations for Other Services      Precautions / Restrictions Precautions Precautions: Fall Precaution Comments: chronic rt knee problems/pain Restrictions Weight Bearing Restrictions: No       Mobility Bed Mobility Overal bed mobility: Needs Assistance Bed Mobility: Rolling, Sidelying to Sit Rolling: Min assist Sidelying to sit: Contact guard assist       General bed mobility comments: Pt s/p proedure yesterday to educated not to weightbear on RUE for now. Cues due to this. Min A to maintain sidelying after rolling.     Transfers Overall transfer level: Needs assistance Equipment used: Rolling walker (2 wheels) Transfers: Sit to/from Stand Sit to Stand: Contact guard assist           General transfer comment: for safety     Balance Overall balance assessment: Needs assistance Sitting-balance support: No upper extremity supported, Feet supported Sitting balance-Leahy Scale: Good Sitting balance - Comments: statically.   Standing balance support: Single extremity supported, Bilateral upper extremity supported, During functional activity Standing balance-Leahy Scale: Poor Standing balance comment: UE support                           ADL either performed or assessed with clinical judgement   ADL Overall ADL's : Needs assistance/impaired Eating/Feeding: Set up;Sitting   Grooming: Contact guard assist;Standing   Upper Body Bathing: Set up;Sitting           Lower Body Dressing: Set up;Sitting/lateral leans Lower Body Dressing Details (indicate cue type and reason): do doff bil socks with figure 4 position Toilet Transfer: Contact guard assist;Rolling walker (2 wheels);BSC/3in1 Toilet Transfer Details (indicate cue type and reason): for safety         Functional mobility during ADLs: Contact guard assist;Rolling walker (2 wheels)      Extremity/Trunk Assessment Upper Extremity Assessment Upper Extremity Assessment: Generalized weakness (generalized weakness s/p pacemaker removal. Not formally assessed)   Lower Extremity Assessment Lower Extremity Assessment: Defer to PT evaluation        Vision   Vision Assessment?: No apparent visual deficits   Perception     Praxis      Cognition Arousal: Alert Behavior During Therapy: WFL for tasks assessed/performed Overall Cognitive Status: Within Functional Limits for tasks assessed  General Comments: pleasant and motivated to participate. Needing new education for  maintenance of comfort with mobility s/p pacemaker removal        Exercises      Shoulder Instructions       General Comments VSS on RA    Pertinent Vitals/ Pain       Pain Assessment Pain Assessment: Faces Faces Pain Scale: Hurts a little bit Pain Location: R knee Pain Descriptors / Indicators: Aching Pain Intervention(s): Limited activity within patient's tolerance, Monitored during session  Home Living                                          Prior Functioning/Environment              Frequency  Min 1X/week        Progress Toward Goals  OT Goals(current goals can now be found in the care plan section)  Progress towards OT goals: Progressing toward goals  Acute Rehab OT Goals Patient Stated Goal: Be able to get around on my own at home OT Goal Formulation: With patient Time For Goal Achievement: 01/04/23 Potential to Achieve Goals: Fair ADL Goals Pt Will Perform Lower Body Dressing: with supervision;sitting/lateral leans;with adaptive equipment Pt Will Transfer to Toilet: with supervision;ambulating;regular height toilet Pt Will Perform Toileting - Clothing Manipulation and hygiene: with supervision;sitting/lateral leans  Plan      Co-evaluation                 AM-PAC OT "6 Clicks" Daily Activity     Outcome Measure   Help from another person eating meals?: None Help from another person taking care of personal grooming?: A Little Help from another person toileting, which includes using toliet, bedpan, or urinal?: A Little Help from another person bathing (including washing, rinsing, drying)?: A Little Help from another person to put on and taking off regular upper body clothing?: A Little Help from another person to put on and taking off regular lower body clothing?: A Little 6 Click Score: 19    End of Session Equipment Utilized During Treatment: Gait belt;Rolling walker (2 wheels)  OT Visit Diagnosis: Unsteadiness on  feet (R26.81);Other abnormalities of gait and mobility (R26.89);Muscle weakness (generalized) (M62.81);Pain Pain - Right/Left: Right Pain - part of body: Knee   Activity Tolerance Patient tolerated treatment well   Patient Left in chair;with call bell/phone within reach;with chair alarm set   Nurse Communication Mobility status        Time: 1610-9604 OT Time Calculation (min): 23 min  Charges: OT General Charges $OT Visit: 1 Visit OT Treatments $Self Care/Home Management : 8-22 mins  Myrla Halsted, OTD, OTR/L [facility] Acute Rehabilitation Office: [phone]   Myrla Halsted 12/30/2022, 10:09 AM

## 2022-12-31 DIAGNOSIS — T827XXS Infection and inflammatory reaction due to other cardiac and vascular devices, implants and grafts, sequela: Secondary | ICD-10-CM | POA: Diagnosis not present

## 2022-12-31 DIAGNOSIS — B9561 Methicillin susceptible Staphylococcus aureus infection as the cause of diseases classified elsewhere: Secondary | ICD-10-CM | POA: Diagnosis not present

## 2022-12-31 DIAGNOSIS — R7881 Bacteremia: Secondary | ICD-10-CM | POA: Diagnosis not present

## 2022-12-31 DIAGNOSIS — R509 Fever, unspecified: Secondary | ICD-10-CM | POA: Diagnosis not present

## 2022-12-31 LAB — BASIC METABOLIC PANEL
Anion gap: 11 (ref 5–15)
BUN: 6 mg/dL (ref 6–20)
CO2: 25 mmol/L (ref 22–32)
Calcium: 7.7 mg/dL — ABNORMAL LOW (ref 8.9–10.3)
Chloride: 100 mmol/L (ref 98–111)
Creatinine, Ser: 0.94 mg/dL (ref 0.61–1.24)
GFR, Estimated: >60 mL/min (ref >60)
Glucose, Bld: 93 mg/dL (ref 70–99)
Potassium: 4.1 mmol/L (ref 3.5–5.1)
Sodium: 136 mmol/L (ref 135–145)

## 2022-12-31 LAB — GLUCOSE, CAPILLARY
Glucose-Capillary: 77 mg/dL (ref 70–99)
Glucose-Capillary: 92 mg/dL (ref 70–99)
Glucose-Capillary: 97 mg/dL (ref 70–99)
Glucose-Capillary: 105 mg/dL — ABNORMAL HIGH (ref 70–99)

## 2022-12-31 LAB — CBC
HCT: 34.6 % — ABNORMAL LOW (ref 39.0–52.0)
Hemoglobin: 10.9 g/dL — ABNORMAL LOW (ref 13.0–17.0)
MCH: 28.6 pg (ref 26.0–34.0)
MCHC: 31.5 g/dL (ref 30.0–36.0)
MCV: 90.8 fL (ref 80.0–100.0)
Platelets: 199 10*3/uL (ref 150–400)
RBC: 3.81 MIL/uL — ABNORMAL LOW (ref 4.22–5.81)
RDW: 14 % (ref 11.5–15.5)
WBC: 6.6 10*3/uL (ref 4.0–10.5)
nRBC: 0 % (ref 0.0–0.2)

## 2022-12-31 LAB — MAGNESIUM: Magnesium: 2 mg/dL (ref 1.7–2.4)

## 2022-12-31 MED FILL — Heparin Sodium (Porcine) Inj 1000 Unit/ML: INTRAMUSCULAR | Qty: 30 | Status: AC

## 2022-12-31 NOTE — Progress Notes (Addendum)
Subjective: No new complaints   Antibiotics:  Anti-infectives (From admission, onward)    Start     Dose/Rate Route Frequency Ordered Stop   12/29/22 1112  gentamicin (GARAMYCIN) 80 mg in sodium chloride 0.9 % 500 mL irrigation  Status:  Discontinued          As needed 12/29/22 1112 12/29/22 1319   12/29/22 0747  sodium chloride 0.9 % with gentamicin (GARAMYCIN) ADS Med       Note to Pharmacy: Yolanda Manges J: cabinet override      12/29/22 0747 12/29/22 1446   12/29/22 0400  ceFAZolin (ANCEF) IVPB 2g/100 mL premix  Status:  Discontinued        2 g 200 mL/hr over 30 Minutes Intravenous To Surgery 12/28/22 2024 12/29/22 1333   12/29/22 0400  ceFAZolin (ANCEF) IVPB 2g/100 mL premix  Status:  Discontinued        2 g 200 mL/hr over 30 Minutes Intravenous To Surgery 12/28/22 1833 12/29/22 1333   12/29/22 0400  vancomycin (VANCOREADY) IVPB 1500 mg/300 mL        1,500 mg 150 mL/hr over 120 Minutes Intravenous  Once 12/28/22 2011 12/29/22 0915   12/19/22 0000  vancomycin (VANCOCIN) IVPB 1000 mg/200 mL premix  Status:  Discontinued        1,000 mg 200 mL/hr over 60 Minutes Intravenous Every 12 hours 12/18/22 0952 12/18/22 1804   12/18/22 2200  ceFAZolin (ANCEF) IVPB 2g/100 mL premix        2 g 200 mL/hr over 30 Minutes Intravenous Every 8 hours 12/18/22 1804     12/18/22 1000  metroNIDAZOLE (FLAGYL) IVPB 500 mg  Status:  Discontinued        500 mg 100 mL/hr over 60 Minutes Intravenous Every 12 hours 12/18/22 0930 12/18/22 0952   12/18/22 1000  vancomycin (VANCOREADY) IVPB 2000 mg/400 mL        2,000 mg 200 mL/hr over 120 Minutes Intravenous  Once 12/18/22 0952 12/18/22 1229   12/18/22 1000  meropenem (MERREM) 1 g in sodium chloride 0.9 % 100 mL IVPB  Status:  Discontinued        1 g 200 mL/hr over 30 Minutes Intravenous Every 8 hours 12/18/22 0952 12/18/22 1804   12/18/22 0445  aztreonam (AZACTAM) 1 g in sodium chloride 0.9 % 100 mL IVPB        1 g 200 mL/hr over 30  Minutes Intravenous  Once 12/18/22 0431 12/18/22 0553       Medications: Scheduled Meds:  sodium chloride   Intravenous Once   atenolol  25 mg Oral Daily   Chlorhexidine Gluconate Cloth  6 each Topical Daily   citalopram  40 mg Oral Daily   clonazePAM  1 mg Oral BID   colchicine  0.6 mg Oral Daily   insulin aspart  0-5 Units Subcutaneous QHS   insulin aspart  0-6 Units Subcutaneous TID WC   methadone  150 mg Oral Daily   mupirocin ointment  1 Application Nasal BID   sodium chloride flush  3 mL Intravenous Q12H   Continuous Infusions:  sodium chloride      ceFAZolin (ANCEF) IV 2 g (12/31/22 0949)   PRN Meds:.Place/Maintain arterial line **AND** sodium chloride, acetaminophen **OR** acetaminophen, albuterol, mouth rinse, sodium chloride flush, traZODone    Objective: Weight change:   Intake/Output Summary (Last 24 hours) at 12/31/2022 1437 Last data filed at 12/31/2022 2952 Gross per 24 hour  Intake --  Output 1400 ml  Net -1400 ml   Blood pressure 107/70, pulse (!) 55, temperature 97.6 F (36.4 C), temperature source Oral, resp. rate 16, height 6\' 1"  (1.854 m), weight 113.4 kg, SpO2 98%. Temp:  [97.6 F (36.4 C)-98.4 F (36.9 C)] 97.6 F (36.4 C) (10/09 1210) Pulse Rate:  [55-74] 55 (10/09 1210) Resp:  [16] 16 (10/09 1210) BP: (95-118)/(61-72) 107/70 (10/09 1210) SpO2:  [96 %-98 %] 98 % (10/09 1210)  Physical Exam: Physical Exam Constitutional:      Appearance: He is well-developed.  HENT:     Head: Normocephalic and atraumatic.  Eyes:     Conjunctiva/sclera: Conjunctivae normal.  Cardiovascular:     Rate and Rhythm: Normal rate and regular rhythm.  Pulmonary:     Effort: Pulmonary effort is normal. No respiratory distress.     Breath sounds: No wheezing.  Abdominal:     General: There is no distension.     Palpations: Abdomen is soft.  Musculoskeletal:        General: Normal range of motion.     Cervical back: Normal range of motion and neck supple.   Skin:    General: Skin is warm and dry.     Findings: No erythema or rash.  Neurological:     General: No focal deficit present.     Mental Status: He is alert and oriented to person, place, and time.  Psychiatric:        Mood and Affect: Mood normal.        Behavior: Behavior normal.        Thought Content: Thought content normal.        Judgment: Judgment normal.      CBC:    BMET Recent Labs    12/29/22 1429 12/31/22 0415  NA 134* 136  K 3.8 4.1  CL 100 100  CO2 19* 25  GLUCOSE 137* 93  BUN 6 6  CREATININE 1.06 0.94  CALCIUM 7.6* 7.7*     Liver Panel  No results for input(s): "PROT", "ALBUMIN", "AST", "ALT", "ALKPHOS", "BILITOT", "BILIDIR", "IBILI" in the last 72 hours.     Sedimentation Rate No results for input(s): "ESRSEDRATE" in the last 72 hours. C-Reactive Protein No results for input(s): "CRP" in the last 72 hours.  Micro Results: Recent Results (from the past 720 hour(s))  Resp panel by RT-PCR (RSV, Flu A&B, Covid) Anterior Nasal Swab     Status: None   Collection Time: 12/18/22  2:39 AM   Specimen: Anterior Nasal Swab  Result Value Ref Range Status   SARS Coronavirus 2 by RT PCR NEGATIVE NEGATIVE Final    Comment: (NOTE) SARS-CoV-2 target nucleic acids are NOT DETECTED.  The SARS-CoV-2 RNA is generally detectable in upper respiratory specimens during the acute phase of infection. The lowest concentration of SARS-CoV-2 viral copies this assay can detect is 138 copies/mL. A negative result does not preclude SARS-Cov-2 infection and should not be used as the sole basis for treatment or other patient management decisions. A negative result may occur with  improper specimen collection/handling, submission of specimen other than nasopharyngeal swab, presence of viral mutation(s) within the areas targeted by this assay, and inadequate number of viral copies(<138 copies/mL). A negative result must be combined with clinical observations, patient  history, and epidemiological information. The expected result is Negative.  Fact Sheet for Patients:  BloggerCourse.com  Fact Sheet for Healthcare Providers:  SeriousBroker.it  This test is no t yet approved or cleared by the [place]  [place] FDA and  has been authorized for detection and/or diagnosis of SARS-CoV-2 by FDA under an Emergency Use Authorization (EUA). This EUA will remain  in effect (meaning this test can be used) for the duration of the COVID-19 declaration under Section 564(b)(1) of the Act, 21 U.S.C.section 360bbb-3(b)(1), unless the authorization is terminated  or revoked sooner.       Influenza A by PCR NEGATIVE NEGATIVE Final   Influenza B by PCR NEGATIVE NEGATIVE Final    Comment: (NOTE) The Xpert Xpress SARS-CoV-2/FLU/RSV plus assay is intended as an aid in the diagnosis of influenza from Nasopharyngeal swab specimens and should not be used as a sole basis for treatment. Nasal washings and aspirates are unacceptable for Xpert Xpress SARS-CoV-2/FLU/RSV testing.  Fact Sheet for Patients: BloggerCourse.com  Fact Sheet for Healthcare Providers: SeriousBroker.it  This test is not yet approved or cleared by the [place] FDA and has been authorized for detection and/or diagnosis of SARS-CoV-2 by FDA under an Emergency Use Authorization (EUA). This EUA will remain in effect (meaning this test can be used) for the duration of the COVID-19 declaration under Section 564(b)(1) of the Act, 21 U.S.C. section 360bbb-3(b)(1), unless the authorization is terminated or revoked.     Resp Syncytial Virus by PCR NEGATIVE NEGATIVE Final    Comment: (NOTE) Fact Sheet for Patients: BloggerCourse.com  Fact Sheet for Healthcare Providers: SeriousBroker.it  This test is not yet approved or cleared by the [place] FDA  and has been authorized for detection and/or diagnosis of SARS-CoV-2 by FDA under an Emergency Use Authorization (EUA). This EUA will remain in effect (meaning this test can be used) for the duration of the COVID-19 declaration under Section 564(b)(1) of the Act, 21 U.S.C. section 360bbb-3(b)(1), unless the authorization is terminated or revoked.  Performed at [facility] [address], [place], [place] [address]   Culture, blood (Routine x 2)     Status: Abnormal   Collection Time: 12/18/22  2:41 AM   Specimen: BLOOD RIGHT FOREARM  Result Value Ref Range Status   Specimen Description   Final    BLOOD RIGHT FOREARM Performed at [facility], [address] [place], [place] [address]    Special Requests   Final    BOTTLES DRAWN AEROBIC AND ANAEROBIC Blood Culture results may not be optimal due to an excessive volume of blood received in culture bottles Performed at [facility], [address], [place], [place] [address]    Culture  Setup Time   Final    GRAM POSITIVE COCCI IN CLUSTERS IN BOTH AEROBIC AND ANAEROBIC BOTTLES CRITICAL VALUE NOTED.  VALUE IS CONSISTENT WITH PREVIOUSLY REPORTED AND CALLED VALUE.    Culture (A)  Final    STAPHYLOCOCCUS AUREUS SUSCEPTIBILITIES PERFORMED ON PREVIOUS CULTURE WITHIN THE LAST 5 DAYS. Performed at [facility], [address], [place], [place] [address]    Report Status 12/20/2022 FINAL  Final  Culture, blood (Routine x 2)     Status: Abnormal   Collection Time: 12/18/22  2:49 AM   Specimen: BLOOD LEFT FOREARM  Result Value Ref Range Status   Specimen Description   Final    BLOOD LEFT FOREARM Performed at [facility], [address], [place], [place] [address]    Special Requests   Final    BOTTLES DRAWN AEROBIC AND ANAEROBIC Blood Culture results may not be optimal due to an excessive volume of blood received in culture bottles  Performed at [facility],  [address] [place], [place] [address]    Culture  Setup Time   Final    GRAM POSITIVE COCCI IN CLUSTERS IN BOTH AEROBIC AND ANAEROBIC BOTTLES CRITICAL RESULT CALLED TO, READ BACK BY AND VERIFIED WITH: PHARMD C. Clearance Coots [id number] @1714  FH Performed at [facility], [address] [place], [place] [address]    Culture STAPHYLOCOCCUS AUREUS (A)  Final   Report Status 12/20/2022 FINAL  Final   Organism ID, Bacteria STAPHYLOCOCCUS AUREUS  Final      Susceptibility   Staphylococcus aureus - MIC*    CIPROFLOXACIN >=8 RESISTANT Resistant     ERYTHROMYCIN >=8 RESISTANT Resistant     GENTAMICIN <=0.5 SENSITIVE Sensitive     OXACILLIN 0.5 SENSITIVE Sensitive     TETRACYCLINE <=1 SENSITIVE Sensitive     VANCOMYCIN <=0.5 SENSITIVE Sensitive     TRIMETH/SULFA >=320 RESISTANT Resistant     CLINDAMYCIN <=0.25 SENSITIVE Sensitive     RIFAMPIN <=0.5 SENSITIVE Sensitive     Inducible Clindamycin NEGATIVE Sensitive     LINEZOLID 2 SENSITIVE Sensitive     * STAPHYLOCOCCUS AUREUS  Blood Culture ID Panel (Reflexed)     Status: Abnormal   Collection Time: 12/18/22  2:49 AM  Result Value Ref Range Status   Enterococcus faecalis NOT DETECTED NOT DETECTED Final   Enterococcus Faecium NOT DETECTED NOT DETECTED Final   Listeria monocytogenes NOT DETECTED NOT DETECTED Final   Staphylococcus species DETECTED (A) NOT DETECTED Final    Comment: CRITICAL RESULT CALLED TO, READ BACK BY AND VERIFIED WITH: PHARMD C. SHADE [id number] @1714  FH    Staphylococcus aureus (BCID) DETECTED (A) NOT DETECTED Final    Comment: CRITICAL RESULT CALLED TO, READ BACK BY AND VERIFIED WITH: PHARMD C. SHADE [id number] @1714  FH    Staphylococcus epidermidis NOT DETECTED NOT DETECTED Final   Staphylococcus lugdunensis NOT DETECTED NOT DETECTED Final   Streptococcus species NOT DETECTED NOT DETECTED Final   Streptococcus agalactiae NOT DETECTED NOT DETECTED Final   Streptococcus pneumoniae NOT DETECTED NOT DETECTED Final    Streptococcus pyogenes NOT DETECTED NOT DETECTED Final   A.calcoaceticus-baumannii NOT DETECTED NOT DETECTED Final   Bacteroides fragilis NOT DETECTED NOT DETECTED Final   Enterobacterales NOT DETECTED NOT DETECTED Final   Enterobacter cloacae complex NOT DETECTED NOT DETECTED Final   Escherichia coli NOT DETECTED NOT DETECTED Final   Klebsiella aerogenes NOT DETECTED NOT DETECTED Final   Klebsiella oxytoca NOT DETECTED NOT DETECTED Final   Klebsiella pneumoniae NOT DETECTED NOT DETECTED Final   Proteus species NOT DETECTED NOT DETECTED Final   Salmonella species NOT DETECTED NOT DETECTED Final   Serratia marcescens NOT DETECTED NOT DETECTED Final   Haemophilus influenzae NOT DETECTED NOT DETECTED Final   Neisseria meningitidis NOT DETECTED NOT DETECTED Final   Pseudomonas aeruginosa NOT DETECTED NOT DETECTED Final   Stenotrophomonas maltophilia NOT DETECTED NOT DETECTED Final   Candida albicans NOT DETECTED NOT DETECTED Final   Candida auris NOT DETECTED NOT DETECTED Final   Candida glabrata NOT DETECTED NOT DETECTED Final   Candida krusei NOT DETECTED NOT DETECTED Final   Candida parapsilosis NOT DETECTED NOT DETECTED Final   Candida tropicalis NOT DETECTED NOT DETECTED Final   Cryptococcus neoformans/gattii NOT DETECTED NOT DETECTED Final   Meth resistant mecA/C and MREJ NOT DETECTED NOT DETECTED Final    Comment: Performed at [facility], [address], [place], [place] [address]  MRSA Next Gen by PCR,  Nasal     Status: None   Collection Time: 12/18/22  1:01 PM   Specimen: Nasal Mucosa; Nasal Swab  Result Value Ref Range Status   MRSA by PCR Next Gen NOT DETECTED NOT DETECTED Final    Comment: (NOTE) The GeneXpert MRSA Assay (FDA approved for NASAL specimens only), is one component of a comprehensive MRSA colonization surveillance program. It is not intended to diagnose MRSA infection nor to guide or monitor treatment for MRSA infections. Test performance is not FDA  approved in patients less than 31 years old. Performed at [facility], [address], [place], [place] [address]   Culture, blood (Routine X 2) w Reflex to ID Panel     Status: None   Collection Time: 12/19/22  3:33 AM   Specimen: BLOOD RIGHT HAND  Result Value Ref Range Status   Specimen Description   Final    BLOOD RIGHT HAND Performed at [facility], [address], [place], [place] [address]    Special Requests   Final    BOTTLES DRAWN AEROBIC AND ANAEROBIC Blood Culture adequate volume Performed at [facility], [address], [place], [place] [address]    Culture   Final    NO GROWTH 5 DAYS Performed at [facility], [address], [place], [place] [address]    Report Status 12/24/2022 FINAL  Final  Culture, blood (Routine X 2) w Reflex to ID Panel     Status: None   Collection Time: 12/19/22  3:33 AM   Specimen: BLOOD RIGHT FOREARM  Result Value Ref Range Status   Specimen Description   Final    BLOOD RIGHT FOREARM Performed at [facility], [address], [place], [place] [address]    Special Requests   Final    BOTTLES DRAWN AEROBIC AND ANAEROBIC Blood Culture adequate volume Performed at [facility], [address] [place], [place] [address]    Culture   Final    NO GROWTH 5 DAYS Performed at [facility], [address] [place], [place] [address]    Report Status 12/24/2022 FINAL  Final  Surgical pcr screen     Status: Abnormal   Collection Time: 12/28/22 11:36 PM   Specimen: Nasal Mucosa; Nasal Swab  Result Value Ref Range Status   MRSA, PCR NEGATIVE NEGATIVE Final   Staphylococcus aureus POSITIVE (A) NEGATIVE Final    Comment: (NOTE) The Xpert SA Assay (FDA approved for NASAL specimens in patients 30 years of age and older), is one component of a comprehensive surveillance program. It is not intended to diagnose infection nor to guide or monitor treatment. Performed  at [facility], [address] [place], [place] [address]   Culture, blood (Routine X 2) w Reflex to ID Panel     Status: None (Preliminary result)   Collection Time: 12/30/22  9:21 AM   Specimen: BLOOD LEFT ARM  Result Value Ref Range Status   Specimen Description BLOOD LEFT ARM  Final   Special Requests   Final    BOTTLES DRAWN AEROBIC AND ANAEROBIC Blood Culture adequate volume   Culture   Final    NO GROWTH < 24 HOURS Performed at [facility], [address], [place], [place] [address]    Report Status PENDING  Incomplete  Culture, blood (Routine X 2) w Reflex to ID Panel     Status: None (Preliminary result)  Collection Time: 12/30/22  9:23 AM   Specimen: BLOOD RIGHT HAND  Result Value Ref Range Status   Specimen Description BLOOD RIGHT HAND  Final   Special Requests   Final    BOTTLES DRAWN AEROBIC AND ANAEROBIC Blood Culture adequate volume   Culture   Final    NO GROWTH < 24 HOURS Performed at [facility], 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 No evidence of retained foreign body. Cardiac silhouette is unremarkable. No airspace disease, effusion, or pneumothorax. No acute bony abnormalities. IMPRESSION: 1. No complication after interval removal of dual lead pacemaker. 2. No acute intrathoracic process. Electronically Signed   By: Sharlet Salina M.D.   On: 12/30/2022 09:42      Assessment/Plan:  INTERVAL HISTORY: blood cultures post device extraction are NGTD so far   Principal Problem:   MSSA bacteremia Active Problems:   Sepsis (HCC)   Shock (HCC)   Hypovolemia   Pacemaker infection (HCC)   Acute pain of right knee    Jeffrey [name] is a 57 y.o.  male with  MSSA bacteremia and pacemaker infection sp extraction. 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 Professional time spent includes the following activities: Preparing to see the patient (review of tests), Obtaining and/or reviewing separately obtained history (admission/discharge record), Performing a medically appropriate examination and/or evaluation , Ordering medications/tests/procedures, referring and communicating with other health care professionals, Documenting clinical information in the EMR, Independently interpreting results (not separately reported), Communicating results to the patient/family/caregiver, Counseling and educating the patient/family/caregiver and Care coordination (not separately reported).    Diagnosis: MSSA bacteremia with cardiac device infection  Culture Result: MSSA  Allergies  Allergen Reactions   Codeine Itching   Penicillins Hives    Has patient had a PCN reaction causing immediate rash, facial/tongue/throat swelling, SOB or lightheadedness with hypotension: unknown Has patient had a PCN reaction causing severe rash involving mucus membranes or skin necrosis: unknown Has patient had a PCN reaction that required hospitalization unknown Has patient had a PCN reaction occurring within the last 10 years: No If all of the above answers are "NO", then may proceed with Cephalosporin use.     OPAT Orders Discharge antibiotics to be given via PICC line Discharge antibiotics: cefazolin 2 g IV given every 8 hours Aim for Vancomycin trough 15-20 or  AUC 400-550 (unless otherwise indicated) Duration: 6 weeks End Date:  02/10/2023  [facility] Care Per Protocol:  Home health RN for IV administration and teaching; PICC line care and labs.    Labs weekly while on IV antibiotics: _xz_ CBC with differential _x_ BMP  x__ Please pull PIC at completion of IV antibiotics __ Please leave PIC in place until doctor has seen patient or been notified  Fax weekly labs to [phone]  Clinic Follow Up Appt:   Jeffrey [name] has an appointment on 01/27/2023 at [facility] with Dr. Daiva Eves at  [facility], which  is located in the [facility] at  [address] [place] in [place].  [address], which is located to the left of the elevators.  Phone: [phone]  Fax: [phone]  [email or web address]  The patient should arrive 30 minutes prior to their appoitment.    LOS: 13 days   Acey Lav 12/31/2022, 2:37 PM

## 2022-12-31 NOTE — TOC Progression Note (Signed)
Transition of Care [facility]) - Progression Note    Patient Details  Name: Jeffrey [name] MRN: [id number] Date of Birth: Sep 29, 1965  Transition of Care [facility] CM/SW Contact  Graves-Bigelow, Lamar Laundry, RN Phone Number: 12/31/2022, 11:21 AM  Clinical Narrative:  Case Manager received notification that the patient will need IV antibiotics for home x 6 weeks. Patient has a [employer] Medicaid Plan- [facility] can accept for IV antibiotic therapy and RN for PICC line maintenance. However, Case Manager is unable to arrange actual [facility] Services- the insurance plan does not have a benefit for services. [facility] will continue to follow the patient and provide education prior to transition home. Case Manager will continue to follow for additional transition of care needs.   Expected Discharge Plan: Home/Self Care Barriers to Discharge: Continued Medical Work up  Expected Discharge Plan and Services In-house Referral: Clinical Social Work Discharge Planning Services: CM Consult   Living arrangements for the past 2 months: Single Family Home   HH Arranged: RN (IV infusions via [facility]) IV antibiotics HH Agency: [facility] Date HH Agency Contacted: 12/31/22 Time HH Agency Contacted: 0930 Representative spoke with at [facility] Agency: Pam   Social Determinants of Health (SDOH) Interventions SDOH Screenings   Food Insecurity: No Food Insecurity (12/21/2022)  Housing: Low Risk  (12/21/2022)  Transportation Needs: No Transportation Needs (12/21/2022)  Utilities: Not At Risk (12/21/2022)  Depression (PHQ2-9): Low Risk  (09/23/2022)  Tobacco Use: High Risk (12/29/2022)    Readmission Risk Interventions    12/21/2022   12:45 PM  Readmission Risk Prevention Plan  Transportation Screening Complete  PCP or Specialist Appt within 5-7 Days Complete  Home Care Screening Complete  Medication Review (RN CM) Complete

## 2022-12-31 NOTE — Progress Notes (Signed)
PHARMACY CONSULT NOTE FOR:  OUTPATIENT  PARENTERAL ANTIBIOTIC THERAPY (OPAT)  Indication: Cardiac device infection Regimen: Ancef 2gm IV q8h End date: 02/10/23  IV antibiotic discharge orders are pended. To discharging provider:  please sign these orders via discharge navigator,  Select New Orders & click on the button choice - Manage This Unsigned Work.     Thank you for allowing pharmacy to be a part of this patient's care.  Harland German, PharmD Clinical Pharmacist **Pharmacist phone directory can now be found on [email or web address] ([email or web address]).  Listed under [facility].

## 2022-12-31 NOTE — Plan of Care (Signed)
  Problem: Education: Goal: Knowledge of General Education information will improve Description: Including pain rating scale, medication(s)/side effects and non-pharmacologic comfort measures Outcome: Progressing   Problem: Health Behavior/Discharge Planning: Goal: Ability to manage health-related needs will improve Outcome: Progressing   Problem: Clinical Measurements: Goal: Ability to maintain clinical measurements within normal limits will improve Outcome: Progressing Goal: Will remain free from infection Outcome: Progressing Goal: Diagnostic test results will improve Outcome: Progressing Goal: Respiratory complications will improve Outcome: Progressing Goal: Cardiovascular complication will be avoided Outcome: Progressing   Problem: Activity: Goal: Risk for activity intolerance will decrease Outcome: Progressing   Problem: Nutrition: Goal: Adequate nutrition will be maintained Outcome: Progressing   Problem: Coping: Goal: Level of anxiety will decrease Outcome: Progressing   Problem: Elimination: Goal: Will not experience complications related to bowel motility Outcome: Progressing Goal: Will not experience complications related to urinary retention Outcome: Progressing   Problem: Pain Managment: Goal: General experience of comfort will improve Outcome: Progressing   Problem: Safety: Goal: Ability to remain free from injury will improve Outcome: Progressing   Problem: Skin Integrity: Goal: Risk for impaired skin integrity will decrease Outcome: Progressing   Problem: Fluid Volume: Goal: Hemodynamic stability will improve Outcome: Progressing   Problem: Clinical Measurements: Goal: Diagnostic test results will improve Outcome: Progressing Goal: Signs and symptoms of infection will decrease Outcome: Progressing   Problem: Respiratory: Goal: Ability to maintain adequate ventilation will improve Outcome: Progressing   Problem: Education: Goal:  Understanding of CV disease, CV risk reduction, and recovery process will improve Outcome: Progressing Goal: Individualized Educational Video(s) Outcome: Progressing   Problem: Activity: Goal: Ability to return to baseline activity level will improve Outcome: Progressing   Problem: Cardiovascular: Goal: Ability to achieve and maintain adequate cardiovascular perfusion will improve Outcome: Progressing Goal: Vascular access site(s) Level 0-1 will be maintained Outcome: Progressing   Problem: Health Behavior/Discharge Planning: Goal: Ability to safely manage health-related needs after discharge will improve Outcome: Progressing   Problem: Education: Goal: Ability to describe self-care measures that may prevent or decrease complications (Diabetes Survival Skills Education) will improve Outcome: Progressing Goal: Individualized Educational Video(s) Outcome: Progressing   Problem: Coping: Goal: Ability to adjust to condition or change in health will improve Outcome: Progressing   Problem: Fluid Volume: Goal: Ability to maintain a balanced intake and output will improve Outcome: Progressing   Problem: Health Behavior/Discharge Planning: Goal: Ability to identify and utilize available resources and services will improve Outcome: Progressing Goal: Ability to manage health-related needs will improve Outcome: Progressing   Problem: Metabolic: Goal: Ability to maintain appropriate glucose levels will improve Outcome: Progressing   Problem: Nutritional: Goal: Maintenance of adequate nutrition will improve Outcome: Progressing Goal: Progress toward achieving an optimal weight will improve Outcome: Progressing   Problem: Skin Integrity: Goal: Risk for impaired skin integrity will decrease Outcome: Progressing   Problem: Tissue Perfusion: Goal: Adequacy of tissue perfusion will improve Outcome: Progressing   Problem: Education: Goal: Will demonstrate proper wound care and  an understanding of methods to prevent future damage Outcome: Progressing Goal: Knowledge of disease or condition will improve Outcome: Progressing Goal: Knowledge of the prescribed therapeutic regimen will improve Outcome: Progressing Goal: Individualized Educational Video(s) Outcome: Progressing

## 2022-12-31 NOTE — Progress Notes (Signed)
[facility] PROGRESS NOTE    Progress Note  Jeffrey [name]  WJX:[id number] DOB: September 22, 1965 DOA: 12/18/2022 PCP: Tresa Garter, MD     Brief Narrative:   Jeffrey [name] is an 57 y.o. 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  Assessment/Plan:   Sepsis secondary to Pacemaker infection leads/MSSA bacteremia: Status post TEE on 01/02/2023. Left heart cath on 12/25/2022, cath removal on 12/29/2022 ID was consulted who recommended repeat blood cultures on 12/29/2022 post device extraction. PICC line when surveillance blood cultures remain negative. Currently on IV cefazolin, which she will need for 6 weeks post negative surveillance blood cultures.  Acute thrombocytopenia on admission likely mild DIC: Now resolved.  Acute kidney injury/hyponatremia/hypokalemia/metabolic acidosis: Resolved with IV fluid resuscitation. Potassium greater than 4 magnesium greater than 2.  Long QTc: Status post pacemaker removal by EP on 12/29/2022.  Obesity: Noted.  Diabetes mellitus type 2: Metformin held on admission, A1c of 10.6 blood glucose actually well-controlled has not required insulin in several days.  Essential hypertension: Continue to hold all antihypertensive medication.  Except atenolol.  Chronic pain/methadone dependence: Continue methadone, patient has been on this for many years and 150. Celexa daily.   DVT prophylaxis: lovenox Family Communication:none Status is: Inpatient Remains inpatient appropriate because: MSSA bacteremia    Code Status:     Code Status Orders  (From admission,  onward)           Start     Ordered   12/18/22 0932  Full code  Continuous       Question:  By:  Answer:  Consent: discussion documented in EHR   12/18/22 0932           Code Status History     Date Active Date Inactive Code Status Order ID Comments User Context   08/14/2017 1046 08/14/2017 1503 Full Code [id number]  Duke Salvia, MD Inpatient         IV Access:   Peripheral IV   Procedures and diagnostic studies:   DG Chest 2 View  Result Date: 12/30/2022 CLINICAL DATA:  Pacemaker infection EXAM: CHEST - 2 VIEW COMPARISON:  12/29/2022 FINDINGS: Frontal and lateral views of the chest demonstrates interval removal of the dual lead pacemaker. No evidence of retained foreign body. Cardiac silhouette is unremarkable. No airspace disease, effusion, or pneumothorax. No acute bony abnormalities. IMPRESSION: 1. No complication after interval removal of dual lead pacemaker. 2. No acute intrathoracic process. Electronically Signed   By: Sharlet Salina M.D.   On: 12/30/2022 09:42   EP PPM/ICD IMPLANT  Result Date: 12/29/2022  CONCLUSIONS:  1.  Sinus node dysfunction with permanent pacemaker in situ  2.  CIED associated bacteremia and septic shock now post pacemaker system extraction  3.  No early apparent complications.   EP STUDY  Result Date: 12/29/2022  CONCLUSIONS:  1.  Sinus node dysfunction with permanent pacemaker in situ  2.  CIED associated bacteremia and septic shock now post pacemaker system extraction  3.  No early apparent complications.     Medical Consultants:   None.   Subjective:    Bascom Levels relates he feels better tolerating  his diet.  Objective:    Vitals:   12/30/22 0527 12/30/22 1602 12/30/22 2025 12/31/22 0810  BP: 135/66 95/72 111/64 118/61  Pulse: 63 (!) 55 (!) 58 74  Resp: 18 16 16 16   Temp: 98.3 F (36.8 C) 98.1 F (36.7 C) 98.4 F (36.9 C) 97.8 F (36.6 C)  TempSrc: Oral Oral Oral Oral  SpO2: 96%   96%  Weight:       Height:       SpO2: 96 % O2 Flow Rate (L/min): 2 L/min   Intake/Output Summary (Last 24 hours) at 12/31/2022 0840 Last data filed at 12/30/2022 2248 Gross per 24 hour  Intake --  Output 800 ml  Net -800 ml   Filed Weights   12/24/22 0341 12/25/22 0345 12/29/22 0637  Weight: 109.4 kg 107.9 kg 113.4 kg    Exam: General exam: In no acute distress. Respiratory system: Good air movement and clear to auscultation. Cardiovascular system: S1 & S2 heard, RRR. No JVD. Gastrointestinal system: Abdomen is nondistended, soft and nontender.  Extremities: No pedal edema. Skin: No rashes, lesions or ulcers Psychiatry: Judgement and insight appear normal. Mood & affect appropriate.    Data Reviewed:    Labs: Basic Metabolic Panel: Recent Labs  Lab 12/26/22 0500 12/26/22 0539 12/27/22 0500 12/28/22 0413 12/29/22 0811 12/29/22 0815 12/29/22 1429 12/31/22 0415  NA  --  133* 135 135 137 137 134* 136  K  --  3.1* 3.4* 4.0 4.0 3.9 3.8 4.1  CL  --  99 100 102 101  --  100 100  CO2  --  24 24 25   --   --  19* 25  GLUCOSE  --  99 86 98 100*  --  137* 93  BUN  --  8 7 <5* 4*  --  6 6  CREATININE  --  0.88 1.01 0.87 0.70  --  1.06 0.94  CALCIUM  --  7.1* 7.3* 7.6*  --   --  7.6* 7.7*  MG 1.8  --   --  2.3  --   --   --   --    GFR Estimated Creatinine Clearance: 111.7 mL/min (by C-G formula based on SCr of 0.94 mg/dL). Liver Function Tests: Recent Labs  Lab 12/25/22 0352 12/26/22 0539  AST 46* 37  ALT 13 12  ALKPHOS 104 85  BILITOT 1.0 0.9  PROT 6.6 6.1*  ALBUMIN 2.4* 2.2*   No results for input(s): "LIPASE", "AMYLASE" in the last 168 hours. No results for input(s): "AMMONIA" in the last 168 hours. Coagulation profile Recent Labs  Lab 12/28/22 2327  INR 1.2   COVID-19 Labs  No results for input(s): "DDIMER", "FERRITIN", "LDH", "CRP" in the last 72 hours.  Lab Results  Component Value Date   SARSCOV2NAA NEGATIVE 12/18/2022    CBC: Recent Labs  Lab  12/26/22 0539 12/27/22 0500 12/28/22 0413 12/29/22 0811 12/29/22 0815 12/29/22 1429 12/31/22 0415  WBC 7.4 6.5 7.5  --   --  13.9* 6.6  HGB 10.0* 10.0* 10.9* 10.5* 11.6* 11.0* 10.9*  HCT 31.3* 31.5* 34.5* 31.0* 34.0* 35.5* 34.6*  MCV 88.4 88.5 87.8  --   --  88.5 90.8  PLT 135* 154 177  --   --  204 199   Cardiac Enzymes: No results for input(s): "CKTOTAL", "CKMB", "CKMBINDEX", "TROPONINI" in the last 168 hours. BNP (last 3 results) No results for input(s): "PROBNP" in the last 8760 hours. CBG: Recent Labs  Lab 12/30/22 [id number]  12/30/22 1150 12/30/22 1605 12/30/22 2240 12/31/22 0807  GLUCAP 103* 129* 81 84 77   D-Dimer: No results for input(s): "DDIMER" in the last 72 hours. Hgb A1c: Recent Labs    12/28/22 2327  HGBA1C 10.7*   Lipid Profile: No results for input(s): "CHOL", "HDL", "LDLCALC", "TRIG", "CHOLHDL", "LDLDIRECT" in the last 72 hours. Thyroid function studies: No results for input(s): "TSH", "T4TOTAL", "T3FREE", "THYROIDAB" in the last 72 hours.  Invalid input(s): "FREET3" Anemia work up: No results for input(s): "VITAMINB12", "FOLATE", "FERRITIN", "TIBC", "IRON", "RETICCTPCT" in the last 72 hours. Sepsis Labs: Recent Labs  Lab 12/27/22 0500 12/28/22 0413 12/29/22 1429 12/31/22 0415  WBC 6.5 7.5 13.9* 6.6   Microbiology Recent Results (from the past 240 hour(s))  Surgical pcr screen     Status: Abnormal   Collection Time: 12/28/22 11:36 PM   Specimen: Nasal Mucosa; Nasal Swab  Result Value Ref Range Status   MRSA, PCR NEGATIVE NEGATIVE Final   Staphylococcus aureus POSITIVE (A) NEGATIVE Final    Comment: (NOTE) The Xpert SA Assay (FDA approved for NASAL specimens in patients 1 years of age and older), is one component of a comprehensive surveillance program. It is not intended to diagnose infection nor to guide or monitor treatment. Performed at [facility], [address], [place], [place] [address]   Culture, blood (Routine X 2) w  Reflex to ID Panel     Status: None (Preliminary result)   Collection Time: 12/30/22  9:21 AM   Specimen: BLOOD LEFT ARM  Result Value Ref Range Status   Specimen Description BLOOD LEFT ARM  Final   Special Requests   Final    BOTTLES DRAWN AEROBIC AND ANAEROBIC Blood Culture adequate volume   Culture   Final    NO GROWTH < 24 HOURS Performed at [facility], [address], [place], [place] [address]    Report Status PENDING  Incomplete  Culture, blood (Routine X 2) w Reflex to ID Panel     Status: None (Preliminary result)   Collection Time: 12/30/22  9:23 AM   Specimen: BLOOD RIGHT HAND  Result Value Ref Range Status   Specimen Description BLOOD RIGHT HAND  Final   Special Requests   Final    BOTTLES DRAWN AEROBIC AND ANAEROBIC Blood Culture adequate volume   Culture   Final    NO GROWTH < 24 HOURS Performed at [facility], [address], [place], [place] [address]    Report Status PENDING  Incomplete     Medications:    sodium chloride   Intravenous Once   atenolol  25 mg Oral Daily   Chlorhexidine Gluconate Cloth  6 each Topical Daily   citalopram  40 mg Oral Daily   clonazePAM  1 mg Oral BID   colchicine  0.6 mg Oral Daily   insulin aspart  0-5 Units Subcutaneous QHS   insulin aspart  0-6 Units Subcutaneous TID WC   methadone  150 mg Oral Daily   mupirocin ointment  1 Application Nasal BID   sodium chloride flush  3 mL Intravenous Q12H   Continuous Infusions:  sodium chloride      ceFAZolin (ANCEF) IV 2 g (12/31/22 0328)      LOS: 13 days   Marinda Elk  [facility]  12/31/2022, 8:40 AM

## 2022-12-31 NOTE — Progress Notes (Signed)
Mobility Specialist Progress Note:   12/31/22 1535  Mobility  Activity Ambulated with assistance in hallway  Level of Assistance Contact guard assist, steadying assist  Assistive Device Front wheel walker  Distance Ambulated (ft) 100 ft  Range of Motion/Exercises Active;All extremities  RUE Weight Bearing WBAT  Activity Response Tolerated well  Mobility Referral Yes  $Mobility charge 1 Mobility  Mobility Specialist Start Time (ACUTE ONLY) 1500  Mobility Specialist Stop Time (ACUTE ONLY) 1515  Mobility Specialist Time Calculation (min) (ACUTE ONLY) 15 min   Pt received in chair, agreeable to mobility session. Ambulated in hallway with RW, CGA with gait belt required for safety. Tolerated well, asx throughout. Returned pt to chair in room, all needs met.   Feliciana Rossetti Mobility Specialist Please contact via Special educational needs teacher or  Rehab office at [phone]

## 2023-01-01 ENCOUNTER — Other Ambulatory Visit: Payer: Self-pay

## 2023-01-01 DIAGNOSIS — T827XXD Infection and inflammatory reaction due to other cardiac and vascular devices, implants and grafts, subsequent encounter: Secondary | ICD-10-CM | POA: Diagnosis not present

## 2023-01-01 DIAGNOSIS — M25561 Pain in right knee: Secondary | ICD-10-CM | POA: Diagnosis not present

## 2023-01-01 DIAGNOSIS — B9561 Methicillin susceptible Staphylococcus aureus infection as the cause of diseases classified elsewhere: Secondary | ICD-10-CM | POA: Diagnosis not present

## 2023-01-01 DIAGNOSIS — R7881 Bacteremia: Secondary | ICD-10-CM | POA: Diagnosis not present

## 2023-01-01 LAB — GLUCOSE, CAPILLARY
Glucose-Capillary: 72 mg/dL (ref 70–99)
Glucose-Capillary: 90 mg/dL (ref 70–99)
Glucose-Capillary: 94 mg/dL (ref 70–99)
Glucose-Capillary: 113 mg/dL — ABNORMAL HIGH (ref 70–99)

## 2023-01-01 LAB — TYPE AND SCREEN
ABO/RH(D): O NEG
Antibody Screen: NEGATIVE
Unit division: 0
Unit division: 0
Unit division: 0
Unit division: 0

## 2023-01-01 LAB — BPAM RBC
Blood Product Expiration Date: 202410222359
Blood Product Expiration Date: 202410232359
Blood Product Expiration Date: 202411042359
Blood Product Expiration Date: 202411042359
ISSUE DATE / TIME: 202410070822
ISSUE DATE / TIME: 202410070822
Unit Type and Rh: 9500
Unit Type and Rh: 9500
Unit Type and Rh: 9500
Unit Type and Rh: 9500

## 2023-01-01 NOTE — Progress Notes (Signed)
Mobility Specialist Progress Note:   01/01/23 1137  Mobility  Activity Ambulated with assistance in hallway  Level of Assistance Contact guard assist, steadying assist  Assistive Device Front wheel walker  Distance Ambulated (ft) 110 ft  Range of Motion/Exercises Active;All extremities  RUE Weight Bearing WBAT  Activity Response Tolerated well  Mobility Referral Yes  $Mobility charge 1 Mobility  Mobility Specialist Start Time (ACUTE ONLY) 1137  Mobility Specialist Stop Time (ACUTE ONLY) 1148  Mobility Specialist Time Calculation (min) (ACUTE ONLY) 11 min   Pt agreeable to mobility session, received in bed. Ambulated in hallway with RW, CGA with gait belt. Tolerated well, asx throughout. Returned pt to room, sitting up in chair. All needs met, call bell in reach.   Feliciana Rossetti Mobility Specialist Please contact via Special educational needs teacher or  Rehab office at [phone]

## 2023-01-01 NOTE — Progress Notes (Signed)
PT Cancellation Note  Patient Details Name: Jeffrey [name] MRN: [id number] DOB: [birth year]   Cancelled Treatment:    Reason Eval/Treat Not Completed: Other (comment) (Pt just walked with mobility. Will return as able.)   Bevelyn Buckles 01/01/2023, 12:38 PM Derrick Tiegs M,PT Acute Rehab Services [phone]

## 2023-01-01 NOTE — Plan of Care (Signed)
  Problem: Education: Goal: Knowledge of General Education information will improve Description: Including pain rating scale, medication(s)/side effects and non-pharmacologic comfort measures Outcome: Progressing   Problem: Health Behavior/Discharge Planning: Goal: Ability to manage health-related needs will improve Outcome: Progressing   Problem: Clinical Measurements: Goal: Ability to maintain clinical measurements within normal limits will improve Outcome: Progressing Goal: Will remain free from infection Outcome: Progressing Goal: Diagnostic test results will improve Outcome: Progressing Goal: Respiratory complications will improve Outcome: Progressing Goal: Cardiovascular complication will be avoided Outcome: Progressing   Problem: Activity: Goal: Risk for activity intolerance will decrease Outcome: Progressing   Problem: Nutrition: Goal: Adequate nutrition will be maintained Outcome: Progressing   Problem: Coping: Goal: Level of anxiety will decrease Outcome: Progressing   Problem: Elimination: Goal: Will not experience complications related to bowel motility Outcome: Progressing Goal: Will not experience complications related to urinary retention Outcome: Progressing   Problem: Pain Managment: Goal: General experience of comfort will improve Outcome: Progressing   Problem: Safety: Goal: Ability to remain free from injury will improve Outcome: Progressing   Problem: Skin Integrity: Goal: Risk for impaired skin integrity will decrease Outcome: Progressing   Problem: Fluid Volume: Goal: Hemodynamic stability will improve Outcome: Progressing   Problem: Clinical Measurements: Goal: Diagnostic test results will improve Outcome: Progressing Goal: Signs and symptoms of infection will decrease Outcome: Progressing   Problem: Respiratory: Goal: Ability to maintain adequate ventilation will improve Outcome: Progressing   Problem: Education: Goal:  Understanding of CV disease, CV risk reduction, and recovery process will improve Outcome: Progressing Goal: Individualized Educational Video(s) Outcome: Progressing   Problem: Activity: Goal: Ability to return to baseline activity level will improve Outcome: Progressing   Problem: Cardiovascular: Goal: Ability to achieve and maintain adequate cardiovascular perfusion will improve Outcome: Progressing Goal: Vascular access site(s) Level 0-1 will be maintained Outcome: Progressing   Problem: Health Behavior/Discharge Planning: Goal: Ability to safely manage health-related needs after discharge will improve Outcome: Progressing   Problem: Education: Goal: Ability to describe self-care measures that may prevent or decrease complications (Diabetes Survival Skills Education) will improve Outcome: Progressing Goal: Individualized Educational Video(s) Outcome: Progressing   Problem: Coping: Goal: Ability to adjust to condition or change in health will improve Outcome: Progressing   Problem: Fluid Volume: Goal: Ability to maintain a balanced intake and output will improve Outcome: Progressing   Problem: Health Behavior/Discharge Planning: Goal: Ability to identify and utilize available resources and services will improve Outcome: Progressing Goal: Ability to manage health-related needs will improve Outcome: Progressing   Problem: Metabolic: Goal: Ability to maintain appropriate glucose levels will improve Outcome: Progressing   Problem: Nutritional: Goal: Maintenance of adequate nutrition will improve Outcome: Progressing Goal: Progress toward achieving an optimal weight will improve Outcome: Progressing   Problem: Skin Integrity: Goal: Risk for impaired skin integrity will decrease Outcome: Progressing   Problem: Tissue Perfusion: Goal: Adequacy of tissue perfusion will improve Outcome: Progressing   Problem: Education: Goal: Will demonstrate proper wound care and  an understanding of methods to prevent future damage Outcome: Progressing Goal: Knowledge of disease or condition will improve Outcome: Progressing Goal: Knowledge of the prescribed therapeutic regimen will improve Outcome: Progressing Goal: Individualized Educational Video(s) Outcome: Progressing

## 2023-01-01 NOTE — Progress Notes (Signed)
[facility] PROGRESS NOTE    Progress Note  Jeffrey [name]  ZOX:[id number] DOB: Aug 28, 1965 DOA: 12/18/2022 PCP: Tresa Garter, MD     Brief Narrative:   Jeffrey [name] is an 57 y.o. 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  Assessment/Plan:   Sepsis secondary to Pacemaker infection leads/MSSA bacteremia: Left heart cath on 12/25/2022, removal of infected pacer leads on 12/29/2022 ID was consulted who recommended repeat blood cultures on 12/29/2022 post device extraction. PICC line when surveillance blood cultures remain negative. Currently on IV cefazolin, which she will need for 6 weeks end of treatment 02/10/2023. Physical therapy evaluated the patient, will need home health PT and home health with IV antibiotics. PICC line to be placed on 01/02/2023  Acute thrombocytopenia on admission likely mild DIC: Now resolved.  Acute kidney injury/hyponatremia/hypokalemia/metabolic acidosis: Resolved with IV fluid resuscitation. Potassium greater than 4 magnesium greater than 2.  Long QTc: Status post pacemaker removal by EP on 12/29/2022.  Obesity: Noted.  Diabetes mellitus type 2: Metformin held on admission, A1c of 10.6 blood glucose actually well-controlled has not required insulin in several days.  Essential hypertension: Continue to hold all antihypertensive medication.  Except atenolol.  Chronic pain/methadone dependence: Continue methadone, patient has been on this for many years and 150. Celexa daily.   DVT prophylaxis: lovenox Family  Communication:none Status is: Inpatient Remains inpatient appropriate because: MSSA bacteremia    Code Status:     Code Status Orders  (From admission, onward)           Start     Ordered   12/18/22 0932  Full code  Continuous       Question:  By:  Answer:  Consent: discussion documented in EHR   12/18/22 0932           Code Status History     Date Active Date Inactive Code Status Order ID Comments User Context   08/14/2017 1046 08/14/2017 1503 Full Code [id number]  Duke Salvia, MD Inpatient         IV Access:   Peripheral IV   Procedures and diagnostic studies:   No results found.   Medical Consultants:   None.   Subjective:    Jeffrey [name] complaints tolerating his diet.  Objective:    Vitals:   12/31/22 2022 01/01/23 0316 01/01/23 0737 01/01/23 0842  BP: (!) 109/59 (!) 113/58 132/63 (!) 140/76  Pulse: 62 (!) 52 (!) 53 63  Resp: 17 18 19    Temp: 98 F (36.7 C) 97.9 F (36.6 C) 97.7 F (36.5 C)   TempSrc: Oral Oral Oral   SpO2: 100% 98% 98%   Weight:      Height:       SpO2: 98 % O2 Flow Rate (L/min): 2 L/min   Intake/Output Summary (Last 24 hours) at 01/01/2023 0849 Last data filed at 01/01/2023 0738 Gross per 24 hour  Intake 100 ml  Output 2075 ml  Net -1975 ml   Filed Weights   12/24/22 0341 12/25/22 0345 12/29/22 0637  Weight: 109.4 kg 107.9 kg 113.4 kg    Exam: General exam:  In no acute distress. Respiratory system: Good air movement and clear to auscultation. Cardiovascular system: S1 & S2 heard, RRR. No JVD. Gastrointestinal system: Abdomen is nondistended, soft and nontender.  Extremities: No pedal edema. Skin: No rashes, lesions or ulcers Psychiatry: Judgement and insight appear normal. Mood & affect appropriate. Data Reviewed:    Labs: Basic Metabolic Panel: Recent Labs  Lab 12/26/22 0500 12/26/22 0539 12/26/22 0539 12/27/22 0500 12/28/22 0413 12/29/22 5409 12/29/22 0815 12/29/22 1429  12/31/22 0415 12/31/22 0858  NA  --  133*   < > 135 135 137 137 134* 136  --   K  --  3.1*   < > 3.4* 4.0 4.0 3.9 3.8 4.1  --   CL  --  99   < > 100 102 101  --  100 100  --   CO2  --  24  --  24 25  --   --  19* 25  --   GLUCOSE  --  99   < > 86 98 100*  --  137* 93  --   BUN  --  8   < > 7 <5* 4*  --  6 6  --   CREATININE  --  0.88   < > 1.01 0.87 0.70  --  1.06 0.94  --   CALCIUM  --  7.1*  --  7.3* 7.6*  --   --  7.6* 7.7*  --   MG 1.8  --   --   --  2.3  --   --   --   --  2.0   < > = values in this interval not displayed.   GFR Estimated Creatinine Clearance: 111.7 mL/min (by C-G formula based on SCr of 0.94 mg/dL). Liver Function Tests: Recent Labs  Lab 12/26/22 0539  AST 37  ALT 12  ALKPHOS 85  BILITOT 0.9  PROT 6.1*  ALBUMIN 2.2*   No results for input(s): "LIPASE", "AMYLASE" in the last 168 hours. No results for input(s): "AMMONIA" in the last 168 hours. Coagulation profile Recent Labs  Lab 12/28/22 2327  INR 1.2   COVID-19 Labs  No results for input(s): "DDIMER", "FERRITIN", "LDH", "CRP" in the last 72 hours.  Lab Results  Component Value Date   SARSCOV2NAA NEGATIVE 12/18/2022    CBC: Recent Labs  Lab 12/26/22 0539 12/27/22 0500 12/28/22 0413 12/29/22 0811 12/29/22 0815 12/29/22 1429 12/31/22 0415  WBC 7.4 6.5 7.5  --   --  13.9* 6.6  HGB 10.0* 10.0* 10.9* 10.5* 11.6* 11.0* 10.9*  HCT 31.3* 31.5* 34.5* 31.0* 34.0* 35.5* 34.6*  MCV 88.4 88.5 87.8  --   --  88.5 90.8  PLT 135* 154 177  --   --  204 199   Cardiac Enzymes: No results for input(s): "CKTOTAL", "CKMB", "CKMBINDEX", "TROPONINI" in the last 168 hours. BNP (last 3 results) No results for input(s): "PROBNP" in the last 8760 hours. CBG: Recent Labs  Lab 12/31/22 0807 12/31/22 1208 12/31/22 1541 12/31/22 2047 01/01/23 0716  GLUCAP 77 92 97 105* 72   D-Dimer: No results for input(s): "DDIMER" in the last 72 hours. Hgb A1c: No results for input(s): "HGBA1C" in the last 72  hours.  Lipid Profile: No results for input(s): "CHOL", "HDL", "LDLCALC", "TRIG", "CHOLHDL", "LDLDIRECT" in the last 72 hours. Thyroid function studies: No results for input(s): "TSH", "T4TOTAL", "T3FREE", "THYROIDAB" in the last 72 hours.  Invalid input(s): "FREET3" Anemia work up: No results for input(s): "VITAMINB12", "  FOLATE", "FERRITIN", "TIBC", "IRON", "RETICCTPCT" in the last 72 hours. Sepsis Labs: Recent Labs  Lab 12/27/22 0500 12/28/22 0413 12/29/22 1429 12/31/22 0415  WBC 6.5 7.5 13.9* 6.6   Microbiology Recent Results (from the past 240 hour(s))  Surgical pcr screen     Status: Abnormal   Collection Time: 12/28/22 11:36 PM   Specimen: Nasal Mucosa; Nasal Swab  Result Value Ref Range Status   MRSA, PCR NEGATIVE NEGATIVE Final   Staphylococcus aureus POSITIVE (A) NEGATIVE Final    Comment: (NOTE) The Xpert SA Assay (FDA approved for NASAL specimens in patients 82 years of age and older), is one component of a comprehensive surveillance program. It is not intended to diagnose infection nor to guide or monitor treatment. Performed at [facility], [address] [place], [place] [address]   Culture, blood (Routine X 2) w Reflex to ID Panel     Status: None (Preliminary result)   Collection Time: 12/30/22  9:21 AM   Specimen: BLOOD LEFT ARM  Result Value Ref Range Status   Specimen Description BLOOD LEFT ARM  Final   Special Requests   Final    BOTTLES DRAWN AEROBIC AND ANAEROBIC Blood Culture adequate volume   Culture   Final    NO GROWTH < 24 HOURS Performed at [facility], [address] [place], [place] [address]    Report Status PENDING  Incomplete  Culture, blood (Routine X 2) w Reflex to ID Panel     Status: None (Preliminary result)   Collection Time: 12/30/22  9:23 AM   Specimen: BLOOD RIGHT HAND  Result Value Ref Range Status   Specimen Description BLOOD RIGHT HAND  Final   Special Requests   Final    BOTTLES DRAWN AEROBIC AND  ANAEROBIC Blood Culture adequate volume   Culture   Final    NO GROWTH < 24 HOURS Performed at [facility], [address], [place], [place] [address]    Report Status PENDING  Incomplete     Medications:    sodium chloride   Intravenous Once   atenolol  25 mg Oral Daily   Chlorhexidine Gluconate Cloth  6 each Topical Daily   citalopram  40 mg Oral Daily   clonazePAM  1 mg Oral BID   colchicine  0.6 mg Oral Daily   insulin aspart  0-5 Units Subcutaneous QHS   insulin aspart  0-6 Units Subcutaneous TID WC   methadone  150 mg Oral Daily   mupirocin ointment  1 Application Nasal BID   sodium chloride flush  3 mL Intravenous Q12H   Continuous Infusions:  sodium chloride      ceFAZolin (ANCEF) IV 2 g (01/01/23 0849)      LOS: 14 days   Marinda Elk  [facility]  01/01/2023, 8:49 AM

## 2023-01-02 DIAGNOSIS — R7881 Bacteremia: Secondary | ICD-10-CM | POA: Diagnosis not present

## 2023-01-02 DIAGNOSIS — E861 Hypovolemia: Secondary | ICD-10-CM | POA: Diagnosis not present

## 2023-01-02 DIAGNOSIS — B9561 Methicillin susceptible Staphylococcus aureus infection as the cause of diseases classified elsewhere: Secondary | ICD-10-CM | POA: Diagnosis not present

## 2023-01-02 LAB — GLUCOSE, CAPILLARY
Glucose-Capillary: 73 mg/dL (ref 70–99)
Glucose-Capillary: 113 mg/dL — ABNORMAL HIGH (ref 70–99)

## 2023-01-02 MED ORDER — SODIUM CHLORIDE 0.9% FLUSH: 10.0000 mL | Freq: Two times a day (BID) | INTRAVENOUS | Status: DC

## 2023-01-02 MED ORDER — CLONAZEPAM 1 MG PO TABS: 1.0000 mg | ORAL_TABLET | Freq: Two times a day (BID) | ORAL | Status: DC | PRN

## 2023-01-02 MED ORDER — SODIUM CHLORIDE 0.9% FLUSH
10.0000 mL | INTRAVENOUS | Status: DC | PRN
  Administered 2023-01-02: 10 mL

## 2023-01-02 MED ORDER — CEFAZOLIN IV (FOR PTA / DISCHARGE USE ONLY): 2.0000 g | Freq: Three times a day (TID) | INTRAVENOUS | 0 refills | Status: DC

## 2023-01-02 MED ORDER — HEPARIN SOD (PORK) LOCK FLUSH 100 UNIT/ML IV SOLN
250.0000 [IU] | INTRAVENOUS | Status: AC | PRN
  Administered 2023-01-02: 250 [IU]

## 2023-01-02 NOTE — Plan of Care (Signed)
  Problem: Education: Goal: Knowledge of General Education information will improve Description: Including pain rating scale, medication(s)/side effects and non-pharmacologic comfort measures Outcome: Progressing   Problem: Health Behavior/Discharge Planning: Goal: Ability to manage health-related needs will improve Outcome: Progressing   Problem: Clinical Measurements: Goal: Ability to maintain clinical measurements within normal limits will improve Outcome: Progressing Goal: Will remain free from infection Outcome: Progressing Goal: Diagnostic test results will improve Outcome: Progressing Goal: Respiratory complications will improve Outcome: Progressing Goal: Cardiovascular complication will be avoided Outcome: Progressing   Problem: Activity: Goal: Risk for activity intolerance will decrease Outcome: Progressing   Problem: Nutrition: Goal: Adequate nutrition will be maintained Outcome: Progressing   Problem: Coping: Goal: Level of anxiety will decrease Outcome: Progressing   Problem: Elimination: Goal: Will not experience complications related to bowel motility Outcome: Progressing Goal: Will not experience complications related to urinary retention Outcome: Progressing   Problem: Pain Managment: Goal: General experience of comfort will improve Outcome: Progressing   Problem: Safety: Goal: Ability to remain free from injury will improve Outcome: Progressing   Problem: Skin Integrity: Goal: Risk for impaired skin integrity will decrease Outcome: Progressing   Problem: Fluid Volume: Goal: Hemodynamic stability will improve Outcome: Progressing   Problem: Clinical Measurements: Goal: Diagnostic test results will improve Outcome: Progressing Goal: Signs and symptoms of infection will decrease Outcome: Progressing   Problem: Respiratory: Goal: Ability to maintain adequate ventilation will improve Outcome: Progressing   Problem: Education: Goal:  Understanding of CV disease, CV risk reduction, and recovery process will improve Outcome: Progressing Goal: Individualized Educational Video(s) Outcome: Progressing   Problem: Activity: Goal: Ability to return to baseline activity level will improve Outcome: Progressing   Problem: Cardiovascular: Goal: Ability to achieve and maintain adequate cardiovascular perfusion will improve Outcome: Progressing Goal: Vascular access site(s) Level 0-1 will be maintained Outcome: Progressing   Problem: Health Behavior/Discharge Planning: Goal: Ability to safely manage health-related needs after discharge will improve Outcome: Progressing   Problem: Education: Goal: Ability to describe self-care measures that may prevent or decrease complications (Diabetes Survival Skills Education) will improve Outcome: Progressing Goal: Individualized Educational Video(s) Outcome: Progressing   Problem: Coping: Goal: Ability to adjust to condition or change in health will improve Outcome: Progressing   Problem: Fluid Volume: Goal: Ability to maintain a balanced intake and output will improve Outcome: Progressing   Problem: Health Behavior/Discharge Planning: Goal: Ability to identify and utilize available resources and services will improve Outcome: Progressing Goal: Ability to manage health-related needs will improve Outcome: Progressing   Problem: Metabolic: Goal: Ability to maintain appropriate glucose levels will improve Outcome: Progressing   Problem: Nutritional: Goal: Maintenance of adequate nutrition will improve Outcome: Progressing Goal: Progress toward achieving an optimal weight will improve Outcome: Progressing   Problem: Skin Integrity: Goal: Risk for impaired skin integrity will decrease Outcome: Progressing   Problem: Tissue Perfusion: Goal: Adequacy of tissue perfusion will improve Outcome: Progressing   Problem: Education: Goal: Will demonstrate proper wound care and  an understanding of methods to prevent future damage Outcome: Progressing Goal: Knowledge of disease or condition will improve Outcome: Progressing Goal: Knowledge of the prescribed therapeutic regimen will improve Outcome: Progressing Goal: Individualized Educational Video(s) Outcome: Progressing

## 2023-01-02 NOTE — Progress Notes (Signed)
Physical Therapy Treatment Patient Details Name: Jeffrey [name] MRN: [id number] DOB: 05-Oct-1965 Today's Date: 01/02/2023   History of Present Illness Patient is a 57 year old male who presented to the hospital on 9/25 with fevers and profound weakness. Patient was admitted with hypotension with pressors started, septic shock secondary to MSSA bacteremia, AKI. Now s/p removal of hardware and TEE. 10/3 R radial angiography. PMH: opioid dependence, diabetes, anxiety, PTSD.    PT Comments  Pt received in bed, declining amb due to just returning from walk with MT. He demo mod I bed mobility and CGA transfers. Pt reports his dad is able to provide needed level of assist at home. PT recommending RW but pt declining need. States he does not intend to use AD for long and will make do with his SW and borrowing his mother's rollator. All education complete. All questions answered. Pt is scheduled for d/c home today.    If plan is discharge home, recommend the following: A little help with walking and/or transfers;A little help with bathing/dressing/bathroom;Assist for transportation;Assistance with cooking/housework   Can travel by private vehicle     Yes  Equipment Recommendations  Rolling walker (2 wheels)    Recommendations for Other Services       Precautions / Restrictions Precautions Precautions: Fall;Other (comment) Precaution Comments: chronic rt knee problems/pain Restrictions Weight Bearing Restrictions: No RUE Weight Bearing: Weight bearing as tolerated     Mobility  Bed Mobility Overal bed mobility: Modified Independent             General bed mobility comments: increased time    Transfers Overall transfer level: Needs assistance   Transfers: Sit to/from Stand Sit to Stand: Contact guard assist           General transfer comment: increased time, pt using momentum    Ambulation/Gait                   Stairs             Wheelchair  Mobility     Tilt Bed    Modified Rankin (Stroke Patients Only)       Balance Overall balance assessment: Needs assistance Sitting-balance support: No upper extremity supported, Feet supported Sitting balance-Leahy Scale: Good     Standing balance support: Bilateral upper extremity supported, During functional activity, Reliant on assistive device for balance Standing balance-Leahy Scale: Poor                              Cognition Arousal: Alert Behavior During Therapy: WFL for tasks assessed/performed Overall Cognitive Status: Within Functional Limits for tasks assessed                                          Exercises      General Comments General comments (skin integrity, edema, etc.): HR in 50s      Pertinent Vitals/Pain Pain Assessment Pain Assessment: Faces Faces Pain Scale: Hurts a little bit Pain Location: R knee Pain Descriptors / Indicators: Grimacing Pain Intervention(s): Limited activity within patient's tolerance    Home Living                          Prior Function            PT Goals (current goals  can now be found in the care plan section) Acute Rehab PT Goals Patient Stated Goal: home today Progress towards PT goals: Progressing toward goals    Frequency    Min 1X/week      PT Plan      Co-evaluation              AM-PAC PT "6 Clicks" Mobility   Outcome Measure  Help needed turning from your back to your side while in a flat bed without using bedrails?: None Help needed moving from lying on your back to sitting on the side of a flat bed without using bedrails?: None Help needed moving to and from a bed to a chair (including a wheelchair)?: A Little Help needed standing up from a chair using your arms (e.g., wheelchair or bedside chair)?: A Little Help needed to walk in hospital room?: A Little Help needed climbing 3-5 steps with a railing? : A Lot 6 Click Score: 19    End of  Session   Activity Tolerance: Patient tolerated treatment well Patient left: in bed;with call bell/phone within reach Nurse Communication: Mobility status PT Visit Diagnosis: Difficulty in walking, not elsewhere classified (R26.2);Other abnormalities of gait and mobility (R26.89) Pain - Right/Left: Right Pain - part of body: Knee     Time: 1217-1230 PT Time Calculation (min) (ACUTE ONLY): 13 min  Charges:    $Therapeutic Activity: 8-22 mins PT General Charges $$ ACUTE PT VISIT: 1 Visit                     Ferd Glassing., PT  Office # [phone]    Ilda Foil 01/02/2023, 12:35 PM

## 2023-01-02 NOTE — Progress Notes (Signed)
Mobility Specialist Progress Note:    01/02/23 1148  Mobility  Activity Ambulated with assistance in hallway  Level of Assistance Contact guard assist, steadying assist  Assistive Device Front wheel walker  Distance Ambulated (ft) 125 ft  Range of Motion/Exercises Active;All extremities  RUE Weight Bearing WBAT  Activity Response Tolerated well  Mobility Referral Yes  $Mobility charge 1 Mobility  Mobility Specialist Start Time (ACUTE ONLY) 1125  Mobility Specialist Stop Time (ACUTE ONLY) 1135  Mobility Specialist Time Calculation (min) (ACUTE ONLY) 10 min   Pt received in bed, agreeable to mobility. Tolerated well, asx throughout. Returned pt to room, all needs met, call bell in reach.   Feliciana Rossetti Mobility Specialist Please contact via Special educational needs teacher or  Rehab office at [phone]

## 2023-01-02 NOTE — Progress Notes (Signed)
Peripherally Inserted Central Catheter Placement  The IV Nurse has discussed with the patient and/or persons authorized to consent for the patient, the purpose of this procedure and the potential benefits and risks involved with this procedure.  The benefits include less needle sticks, lab draws from the catheter, and the patient may be discharged home with the catheter. Risks include, but not limited to, infection, bleeding, blood clot (thrombus formation), and puncture of an artery; nerve damage and irregular heartbeat and possibility to perform a PICC exchange if needed/ordered by physician.  Alternatives to this procedure were also discussed.  Bard Power PICC patient education guide, fact sheet on infection prevention and patient information card has been provided to patient /or left at bedside.    PICC Placement Documentation  PICC Single Lumen 01/02/23 Right Basilic 41 cm 0 cm (Active)  Indication for Insertion or Continuance of Line Home intravenous therapies (PICC only) 01/02/23 1107  Exposed Catheter (cm) 0 cm 01/02/23 1107  Site Assessment Clean, Dry, Intact 01/02/23 1107  Line Status Flushed;Saline locked;Blood return noted 01/02/23 1107  Dressing Type Transparent;Securing device 01/02/23 1107  Dressing Status Antimicrobial disc in place 01/02/23 1107  Line Care Connections checked and tightened 01/02/23 1107  Line Adjustment (NICU/IV Team Only) No 01/02/23 1107  Dressing Change Due 01/09/23 01/02/23 1107       Romie Jumper 01/02/2023, 11:12 AM

## 2023-01-02 NOTE — TOC Transition Note (Signed)
Transition of Care [facility]) - CM/SW Discharge Note   Patient Details  Name: Jeffrey [name] MRN: [id number] Date of Birth: April 14, 1965  [facility]) CM/SW Contact:  Gala Lewandowsky, RN Phone Number: 01/02/2023, 9:54 AM   Clinical Narrative: Plan for patient to transition home today. Case Manager reached out to [facility] with [employer] and she will be able to educate the patient today and get the patient safely discharged home. [facility] RN will  assist with PICC Line Care. No further needs identified at this time.    Final next level of care: Home/Self Care-[employer] will assist with RN for PICC Line Care Barriers to Discharge: Continued Medical Work up  Patient Goals and CMS Choice   Choice offered to / list presented to : Patient   Discharge Plan and Services Additional resources added to the After Visit Summary for   In-house Referral: Clinical Social Work Discharge Planning Services: CM Consult  HH Arranged: RN, IV Antibiotics (IV infusions via [employer]) HH Agency: [facility] Date HH Agency Contacted: 12/31/22 Time HH Agency Contacted: 0930 Representative spoke with at [facility] Agency: Pam  Social Determinants of Health (SDOH) Interventions SDOH Screenings   Food Insecurity: No Food Insecurity (12/21/2022)  Housing: Low Risk  (12/21/2022)  Transportation Needs: No Transportation Needs (12/21/2022)  Utilities: Not At Risk (12/21/2022)  Depression (PHQ2-9): Low Risk  (09/23/2022)  Tobacco Use: High Risk (12/29/2022)   Readmission Risk Interventions    12/21/2022   12:45 PM  Readmission Risk Prevention Plan  Transportation Screening Complete  PCP or Specialist Appt within 5-7 Days Complete  Home Care Screening Complete  Medication Review (RN CM) Complete

## 2023-01-02 NOTE — Discharge Summary (Signed)
Physician Discharge Summary  [name]:[id number] DOB: 04-02-1965 DOA: 12/18/2022  PCP: Tresa Garter, MD  Admit date: 12/18/2022 Discharge date: 01/02/2023  Admitted From: Home Disposition:  Home  Recommendations for Outpatient Follow-up:  Follow up with PCP in 1-2 weeks Please obtain BMP/CBC in one week Home health to follow-up and continue IV antibiotics. He will follow-up with ID as an outpatient.  In the day treatment 02/10/2023  Home Health:Yes Equipment/Devices:None  Discharge Condition:Stable CODE STATUS:Full Diet recommendation: Heart Healthy  Brief/Interim Summary:  57 y.o. 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   Discharge Diagnoses:  Principal Problem:   MSSA bacteremia Active Problems:   Sepsis (HCC)   Shock (HCC)   Hypovolemia   Pacemaker infection (HCC)   Acute pain of right knee  Sepsis secondary to pacemaker infection leads/MSSA bacteremia: Left heart cath on 12/25/2022 was done and removal of pacer leads was done on 12/29/2022. ID was consulted and recommend repeat blood cultures 12/29/2022 post device extraction and cultures are remain negative till date. He also recommended to continue IV cefazolin for 6 weeks end of the treatment 02/10/2023. Physical therapy evaluated the patient recommended home health PT. PICC line was inserted on 01/02/2023.  Acute thrombocytopenia on admission likely that mild DIC: Now resolved.  Acute kidney injury/hyponatremia/hyperkalemia/metabolic acidosis: Resolved with IV fluids.  Prolonged  QTc: Status post pacemaker removal by EP on 12/29/2022. Follow-up with EP as an outpatient.  Obesity: Noted.  Diabetes mellitus type 2: With a last A1c of 10.6 he required minimal insulin in house he will continue his current treatment as an outpatient as no changes made.  Essential hypertension: He can resume his antihypertensive medication as an outpatient as no changes made.  Chronic pain/methadone dependence: No changes made continue methadone as an outpatient.    Discharge Instructions  Discharge Instructions     [facility] pharmacist to adjust dose for Vancomycin, Aminoglycosides and other anti-infective therapies as requested by physician.   Complete by: As directed    [facility] to provide Cath Flo 2mg    Complete by: As directed    Administer for PICC line occlusion and as ordered by physician for other access device issues.   Anaphylaxis Kit: Provided to treat any anaphylactic reaction to the medication being provided to the patient if First Dose or when requested by physician   Complete by: As directed    Epinephrine 1mg /ml vial / amp: Administer 0.3mg  (0.40ml) subcutaneously once for moderate to severe anaphylaxis, nurse to call physician and pharmacy when reaction occurs and call 911 if needed for immediate care   Diphenhydramine 50mg /ml IV vial: Administer 25-50mg  IV/IM PRN for first dose reaction, rash, itching, mild reaction, nurse to call physician and pharmacy when reaction occurs   Sodium Chloride 0.9% NS IV: Administer if needed for hypovolemic blood pressure drop or as ordered by physician after call to physician with anaphylactic reaction   Change dressing on IV access line weekly and PRN   Complete by: As directed    Diet - low sodium heart healthy   Complete by: As directed    Flush IV  access with Sodium Chloride 0.9% and Heparin 10 units/ml or 100 units/ml   Complete by: As directed    Home infusion instructions - [facility]   Complete by: As directed    Instructions: Flush IV access with Sodium Chloride 0.9% and Heparin 10units/ml or 100units/ml   Change dressing on IV access line: Weekly and PRN   Instructions Cath Flo 2mg : Administer for PICC Line occlusion and as ordered by physician for other access device   [facility] pharmacist to adjust dose for: Vancomycin, Aminoglycosides and other anti-infective therapies as requested by physician   Increase activity slowly   Complete by: As directed    Method of administration may be changed at the discretion of home infusion pharmacist based upon assessment of the patient and/or caregiver's ability to self-administer the medication ordered   Complete by: As directed    No wound care   Complete by: As directed       Allergies as of 01/02/2023       Reactions   Codeine Itching   Penicillins Hives   Has patient had a PCN reaction causing immediate rash, facial/tongue/throat swelling, SOB or lightheadedness with hypotension: unknown Has patient had a PCN reaction causing severe rash involving mucus membranes or skin necrosis: unknown Has patient had a PCN reaction that required hospitalization unknown Has patient had a PCN reaction occurring within the last 10 years: No If all of the above answers are "NO", then may proceed with Cephalosporin use.        Medication List     STOP taking these medications    silver sulfADIAZINE 1 % cream Commonly known as: Silvadene       TAKE these medications    ammonium lactate 12 % cream Commonly known as: AMLACTIN Apply 1 Application topically as needed for dry skin. What changed: reasons to take this   atenolol 50 MG tablet Commonly known as: TENORMIN Take 1 tablet (50 mg total) by mouth daily.   ceFAZolin IVPB Commonly known as: ANCEF Inject 2 g into the vein every 8 (eight) hours. Indication:  Cardiac device infection First Dose: No Last Day of Therapy:  02/10/23 Labs - Once  weekly:  CBC/D and BMP, Labs - Once weekly: ESR and CRP Method of administration: IV Push Method of administration may be changed at the discretion of home infusion pharmacist based upon assessment of the patient and/or caregiver's ability to self-administer the medication ordered.   citalopram 40 MG tablet Commonly known as: CELEXA Take 1 tablet (40 mg total) by mouth daily. Must keep 05/21/22 appt for future refills What changed: additional instructions   clonazePAM 1 MG tablet Commonly known as: KLONOPIN Take 1 tablet (1 mg total) by mouth 2 (two) times daily as needed for anxiety.   FreeStyle Libre 3 Sensor Misc 1 Units by Does not apply route every 14 (fourteen) days. What changed:  how much to take how to take this   ibuprofen 200 MG tablet Commonly known as: ADVIL Take 400 mg by mouth every 6 (six) hours as needed for mild pain, moderate pain or headache.   metFORMIN 500 MG tablet Commonly known as: GLUCOPHAGE Take 1 tablet (500 mg total) by mouth 2 (two) times daily with a meal.   methadone 10 MG/ML solution Commonly known as: DOLOPHINE Take 150 mg by mouth in the morning.   mupirocin ointment 2 % Commonly known as: BACTROBAN On leg wound w/dressing change qd or bid What changed:  how  much to take how to take this when to take this additional instructions   triamcinolone cream 0.1 % Commonly known as: KENALOG Apply 1 application topically 3 (three) times daily.   valACYclovir 1000 MG tablet Commonly known as: VALTREX Take 1 tablet (1,000 mg total) by mouth 3 (three) times daily.   Vitamin D (Ergocalciferol) 1.25 MG (50000 UNIT) Caps capsule Commonly known as: DRISDOL Take 1 capsule (50,000 Units total) by mouth every 7 (seven) days. What changed: when to take this   Vitamin D3 50 MCG (2000 UT) capsule Take 1 capsule (2,000 Units total) by mouth daily.               Discharge Care Instructions  (From admission, onward)           Start      Ordered   01/02/23 0000  Change dressing on IV access line weekly and PRN  (Home infusion instructions - [facility] )        01/02/23 0902            Allergies  Allergen Reactions   Codeine Itching   Penicillins Hives    Has patient had a PCN reaction causing immediate rash, facial/tongue/throat swelling, SOB or lightheadedness with hypotension: unknown Has patient had a PCN reaction causing severe rash involving mucus membranes or skin necrosis: unknown Has patient had a PCN reaction that required hospitalization unknown Has patient had a PCN reaction occurring within the last 10 years: No If all of the above answers are "NO", then may proceed with Cephalosporin use.     Consultations: Infectious disease   Procedures/Studies: [place] EKG SITE RITE  Result Date: 01/01/2023 If Site Rite image not attached, placement could not be confirmed due to current cardiac rhythm.  DG Chest 2 View  Result Date: 12/30/2022 CLINICAL DATA:  Pacemaker infection EXAM: CHEST - 2 VIEW COMPARISON:  12/29/2022 FINDINGS: Frontal and lateral views of the chest demonstrates interval removal of the dual lead pacemaker. No evidence of retained foreign body. Cardiac silhouette is unremarkable. No airspace disease, effusion, or pneumothorax. No acute bony abnormalities. IMPRESSION: 1. No complication after interval removal of dual lead pacemaker. 2. No acute intrathoracic process. Electronically Signed   By: Sharlet Salina M.D.   On: 12/30/2022 09:42   EP PPM/ICD IMPLANT  Result Date: 12/29/2022  CONCLUSIONS:  1.  Sinus node dysfunction with permanent pacemaker in situ  2.  CIED associated bacteremia and septic shock now post pacemaker system extraction  3.  No early apparent complications.   EP STUDY  Result Date: 12/29/2022  CONCLUSIONS:  1.  Sinus node dysfunction with permanent pacemaker in situ  2.  CIED associated bacteremia and septic shock now post pacemaker system extraction  3.  No  early apparent complications.   DG Chest 2 View  Result Date: 12/29/2022 CLINICAL DATA:  [id number], preoperative chest exam. EXAM: CHEST - 2 VIEW COMPARISON:  Portable chest 12/18/2022 FINDINGS: Again noted left chest dual lead pacing system with stable wire placements. Heart size and vasculature and the mediastinal configuration are normal. The lungs are clear. No new osseous findings. IMPRESSION: No active cardiopulmonary disease. Electronically Signed   By: Almira Bar M.D.   On: 12/29/2022 04:33   CARDIAC CATHETERIZATION  Result Date: 12/25/2022 No angiographic evidence of CAD LVEDP 16 mmHg Recommendations: No further ischemic workup   ECHO TEE  Result Date: 12/23/2022    TRANSESOPHOGEAL ECHO REPORT   Patient Name:   Jeffrey [name] Date  of Exam: 12/23/2022 Medical Rec #:  [id number]         Height:       73.0 in Accession #:    [id number]        Weight:       245.8 lb Date of Birth:  1965/12/03         BSA:          2.349 m Patient Age:    57 years          BP:           108/71 mmHg Patient Gender: M                 HR:           73 bpm. Exam Location:  Inpatient Procedure: Transesophageal Echo and Limited Color Doppler Indications:     Bacteremia  History:         Patient has prior history of Echocardiogram examinations, most                  recent 12/19/2022. Pacemaker; Risk Factors:h/o substance abuse.  Sonographer:     Thurman Coyer RDCS Referring Phys:  [id number] [facility] Diagnosing Phys: [place] PROCEDURE: The transesophogeal probe was passed without difficulty through the esophogus of the patient. Sedation performed by different physician. The patient was monitored while under deep sedation. Anesthestetic sedation was provided intravenously by Anesthesiology: 300mg  of Propofol, 100mg  of Lidocaine. The patient developed no complications during the procedure.  IMPRESSIONS  1. Left ventricular ejection fraction, by estimation, is 60 to 65%. The left ventricle has normal function.  2.  Right ventricular systolic function is normal. The right ventricular size is normal.  3. No left atrial/left atrial appendage thrombus was detected.  4. The mitral valve is normal in structure. Trivial mitral valve regurgitation.  5. The aortic valve is normal in structure. Aortic valve regurgitation is not visualized.  6. There is mild (Grade II) plaque. Conclusion(s)/Recommendation(s): Small strand coming from the right atrial lead, can be fibrinous materal (slide 33) (if bacteremia is persistent, can consider repeat study). FINDINGS  Left Ventricle: Left ventricular ejection fraction, by estimation, is 60 to 65%. The left ventricle has normal function. The left ventricular internal cavity size was normal in size. Right Ventricle: The right ventricular size is normal. Right ventricular systolic function is normal. Left Atrium: No left atrial/left atrial appendage thrombus was detected. Pericardium: There is no evidence of pericardial effusion. Mitral Valve: The mitral valve is normal in structure. Trivial mitral valve regurgitation. Tricuspid Valve: The tricuspid valve is normal in structure. Tricuspid valve regurgitation is mild. Aortic Valve: The aortic valve is normal in structure. Aortic valve regurgitation is not visualized. Pulmonic Valve: The pulmonic valve was normal in structure. Pulmonic valve regurgitation is not visualized. Aorta: The aortic root is normal in size and structure. There is mild (Grade II) plaque. IAS/Shunts: No atrial level shunt detected by color flow Doppler. Additional Comments: A device lead is visualized. Carolan Clines Electronically signed by Carolan Clines Signature Date/Time: 12/23/2022/3:42:06 PM    Final    EP STUDY  Result Date: 12/23/2022 See surgical note for result.  CT KNEE RIGHT W CONTRAST  Addendum Date: 12/22/2022   ADDENDUM REPORT: 12/22/2022 15:50 ADDENDUM: The original report was by Dr. Gaylyn Rong. The following addendum is by Dr. Gaylyn Rong: This is  to document that we obtained multiplanar reformatted images to better align with the joint (series 11 through series 605). These  better show mild medial compartmental articular space narrowing favoring mild degenerative chondral thinning in the medial compartment. Otherwise, no change to the original report findings or conclusions. Electronically Signed   By: Gaylyn Rong M.D.   On: 12/22/2022 15:50   Result Date: 12/22/2022 CLINICAL DATA:  Infection EXAM: CT OF THE RIGHT KNEE WITH CONTRAST TECHNIQUE: Multidetector CT imaging was performed following the standard protocol during bolus administration of intravenous contrast. RADIATION DOSE REDUCTION: This exam was performed according to the departmental dose-optimization program which includes automated exposure control, adjustment of the mA and/or kV according to patient size and/or use of iterative reconstruction technique. CONTRAST:  OMNIPAQUE IOHEXOL 300 MG/ML  SOLN COMPARISON:  None Available. FINDINGS: Bones/Joint/Cartilage Mild marginal spurring in the patella. No fracture or acute bony findings. No bony destructive lesion or focal bony demineralization to indicate osteomyelitis. No effusion. Ligaments Suboptimally assessed by CT. Muscles and Tendons Equivocal effacement of fat stranding in the tibialis anterior muscle, strictly speaking I cannot exclude myositis. MRI would be more definitive in this assessment. Soft tissues No abscess. Subcutaneous edema anterolaterally along the knee and calf. No subcutaneous gas observed. IMPRESSION: 1. Subcutaneous edema anterolaterally along the knee and calf. No abscess or subcutaneous gas. 2. Equivocal effacement of fat stranding in the tibialis anterior muscle, strictly speaking I cannot exclude myositis. MRI would be more definitive in this assessment. 3. Mild marginal spurring in the patella. Electronically Signed: By: Gaylyn Rong M.D. On: 12/22/2022 14:29   ECHOCARDIOGRAM COMPLETE  Result  Date: 12/19/2022    ECHOCARDIOGRAM REPORT   Patient Name:   Jeffrey [name] Date of Exam: 12/19/2022 Medical Rec #:  [id number]         Height:       73.0 in Accession #:    [id number]        Weight:       245.8 lb Date of Birth:  1965/10/01         BSA:          2.349 m Patient Age:    57 years          BP:           117/55 mmHg Patient Gender: M                 HR:           80 bpm. Exam Location:  Inpatient Procedure: 2D Echo, Cardiac Doppler and Color Doppler Indications:    Shock  History:        Patient has no prior history of Echocardiogram examinations.                 Pacemaker.  Sonographer:    Darlys Gales Referring Phys: [phone] Clarene Critchley [facility]  Sonographer Comments: No apical window and no subcostal window. Technically difficult study due to patient position and body habitus. IMPRESSIONS  1. Left ventricular ejection fraction, by estimation, is 65 to 70%. The left ventricle has normal function. The left ventricle has no regional wall motion abnormalities. Left ventricular diastolic function could not be evaluated.  2. Right ventricular systolic function is normal. The right ventricular size is normal.  3. The mitral valve is normal in structure. No evidence of mitral valve regurgitation. No evidence of mitral stenosis.  4. The aortic valve is tricuspid. Aortic valve regurgitation is not visualized. No aortic stenosis is present.  5. The inferior vena cava is normal in size with greater than 50% respiratory variability, suggesting right  atrial pressure of 3 mmHg. FINDINGS  Left Ventricle: Left ventricular ejection fraction, by estimation, is 65 to 70%. The left ventricle has normal function. The left ventricle has no regional wall motion abnormalities. The left ventricular internal cavity size was normal in size. There is  no left ventricular hypertrophy. Left ventricular diastolic function could not be evaluated. Right Ventricle: The right ventricular size is normal. No increase in right ventricular wall  thickness. Right ventricular systolic function is normal. Left Atrium: Left atrial size was normal in size. Right Atrium: Right atrial size was normal in size. Pericardium: There is no evidence of pericardial effusion. Mitral Valve: The mitral valve is normal in structure. No evidence of mitral valve regurgitation. No evidence of mitral valve stenosis. Tricuspid Valve: The tricuspid valve is normal in structure. Tricuspid valve regurgitation is trivial. No evidence of tricuspid stenosis. Aortic Valve: The aortic valve is tricuspid. Aortic valve regurgitation is not visualized. No aortic stenosis is present. Pulmonic Valve: The pulmonic valve was normal in structure. Pulmonic valve regurgitation is not visualized. No evidence of pulmonic stenosis. Aorta: The aortic root is normal in size and structure. Venous: The inferior vena cava is normal in size with greater than 50% respiratory variability, suggesting right atrial pressure of 3 mmHg. IAS/Shunts: No atrial level shunt detected by color flow Doppler.  LEFT VENTRICLE PLAX 2D LVIDd:         3.70 cm LVIDs:         2.10 cm LV PW:         1.20 cm LV IVS:        1.00 cm LVOT diam:     2.00 cm LVOT Area:     3.14 cm   AORTA Ao Root diam: 3.30 cm TRICUSPID VALVE TR Peak grad:   11.3 mmHg TR Vmax:        168.00 cm/s  SHUNTS Systemic Diam: 2.00 cm Chilton Si MD Electronically signed by Chilton Si MD Signature Date/Time: 12/19/2022/11:20:30 AM    Final    DG CHEST PORT 1 VIEW  Result Date: 12/18/2022 CLINICAL DATA:  Central line placement. EXAM: PORTABLE CHEST 1 VIEW COMPARISON:  Radiograph and CT earlier today FINDINGS: Significant patient rotation. New right internal jugular central venous catheter tip overlies the mid SVC. No pneumothorax. Stable heart size and mediastinal contours allowing for rotation. Development of vascular congestion. No focal airspace disease. No pleural fluid or pneumothorax. No acute osseous findings. Left-sided pacemaker remains  in place. IMPRESSION: 1. New right internal jugular central venous catheter tip overlies the mid SVC. No pneumothorax. 2. Development of vascular congestion. Electronically Signed   By: Narda Rutherford M.D.   On: 12/18/2022 17:35   CT CHEST ABDOMEN PELVIS W CONTRAST  Result Date: 12/18/2022 CLINICAL DATA:  Sepsis. EXAM: CT CHEST, ABDOMEN, AND PELVIS WITH CONTRAST TECHNIQUE: Multidetector CT imaging of the chest, abdomen and pelvis was performed following the standard protocol during bolus administration of intravenous contrast. RADIATION DOSE REDUCTION: This exam was performed according to the departmental dose-optimization program which includes automated exposure control, adjustment of the mA and/or kV according to patient size and/or use of iterative reconstruction technique. CONTRAST:  OMNIPAQUE IOHEXOL 300 MG/ML  SOLN COMPARISON:  None Available. FINDINGS: CT CHEST FINDINGS Cardiovascular: Normal heart size. No pericardial effusion. There is a left chest wall dual lead pacer. Mediastinum/Nodes: No enlarged mediastinal, hilar, or axillary lymph nodes. Thyroid gland, trachea, and esophagus demonstrate no significant findings. Lungs/Pleura: No pleural fluid or pneumothorax. No airspace consolidation identified.  Mosaic attenuation pattern is identified within both lower lung zones. Mild bilateral lower lobe bronchial wall thickening. Musculoskeletal: No chest wall mass or suspicious bone lesions identified. Bilateral gynecomastia. CT ABDOMEN PELVIS FINDINGS Hepatobiliary: No suspicious liver abnormality. Subjective hepatic steatosis. Gallbladder appears normal. There is no bile duct dilatation. Pancreas: Unremarkable. No pancreatic ductal dilatation or surrounding inflammatory changes. Spleen: Spleen measures 15.3 cm in cranial caudal dimension. No focal splenic lesion. Adrenals/Urinary Tract: Normal adrenal glands. Simple cyst arises off the upper pole of the left kidney measuring 1.2 cm. Cyst off the  inferior pole of the left kidney measures 1 cm. No follow-up imaging recommended. No signs of nephrolithiasis or hydronephrosis. Urinary bladder is unremarkable. Stomach/Bowel: Stomach is normal. The appendix is visualized and is within normal limits. No pathologic dilatation of the large or small bowel loops. Proximal transverse colon appears decompressed with mucosal enhancement and wall thickening without surrounding inflammatory fat stranding, image 134/8. Intramural fatty deposition is identified involving the ileum and ascending colon. Vascular/Lymphatic: Mild aortic atherosclerosis. Porta hepatic lymph node measures 1.2 cm, image 58/2. Previously 1 cm. Portocaval lymph node measures 1.1 cm, image 56/2. Previously 0.8 cm. No pelvic or inguinal adenopathy. Reproductive: Prostate is unremarkable. Other: No ascites or focal fluid collections. Laxity of the midline ventral abdominal wall identified. No signs of pneumoperitoneum. Musculoskeletal: No acute or suspicious osseous findings. L5-S1 degenerative disc disease. IMPRESSION: 1. No acute findings within the chest, abdomen or pelvis. 2. Mosaic attenuation pattern within both lower lung zones with mild bilateral lower lobe bronchial wall thickening. Findings are nonspecific but may be seen in the setting of small airways disease. 3. Proximal transverse colon appears decompressed with mucosal enhancement and wall thickening without surrounding inflammatory fat stranding. Findings are nonspecific and may be related to underdistention. Correlate for any clinical signs or symptoms of colitis. 4. Intramural fatty deposition is identified involving the ileum and ascending colon. This is a nonspecific finding but can be seen in the setting of chronic inflammatory bowel disease. 5. Hepatic steatosis. 6. Splenomegaly. 7. Mildly enlarged porta hepatic and portocaval lymph nodes. These are nonspecific and may be reactive. 8.  Aortic Atherosclerosis (ICD10-I70.0).  Electronically Signed   By: Signa Kell M.D.   On: 12/18/2022 07:46   DG Chest Port 1 View  Result Date: 12/18/2022 CLINICAL DATA:  57 year old male with suspected meningococcal sepsis. Weakness. EXAM: PORTABLE CHEST 1 VIEW COMPARISON:  Chest radiographs 07/11/2017 and earlier. FINDINGS: Portable AP semi upright view at 0304 hours. Low lung volumes and mildly rotated to the left. Stable chronic left chest dual lead pacemaker. Stable cardiac size and mediastinal contours. Allowing for low lung volumes, Allowing for portable technique the lungs are clear. No pneumothorax, pulmonary edema, pleural effusion, or consolidation identified. No acute osseous abnormality identified. Paucity of bowel gas in the visible abdomen. IMPRESSION: Low lung volumes, otherwise no acute cardiopulmonary abnormality. Electronically Signed   By: Odessa Fleming M.D.   On: 12/18/2022 04:23   (Echo, Carotid, EGD, Colonoscopy, ERCP)    Subjective: No complaints feels great.  Discharge Exam: Vitals:   01/02/23 0557 01/02/23 0841  BP: 119/62 (!) 111/59  Pulse: (!) 46 (!) 46  Resp: 18 16  Temp: 97.7 F (36.5 C) 97.9 F (36.6 C)  SpO2: 98% 97%   Vitals:   01/01/23 1607 01/01/23 1900 01/02/23 0557 01/02/23 0841  BP: 112/62 102/60 119/62 (!) 111/59  Pulse: (!) 48 62 (!) 46 (!) 46  Resp: 17 18 18 16   Temp: 97.9 F (36.6  C) 97.6 F (36.4 C) 97.7 F (36.5 C) 97.9 F (36.6 C)  TempSrc: Oral Oral Oral Oral  SpO2: 97% 99% 98% 97%  Weight:      Height:        General: Pt is alert, awake, not in acute distress Cardiovascular: RRR, S1/S2 +, no rubs, no gallops Respiratory: CTA bilaterally, no wheezing, no rhonchi Abdominal: Soft, NT, ND, bowel sounds + Extremities: no edema, no cyanosis    The results of significant diagnostics from this hospitalization (including imaging, microbiology, ancillary and laboratory) are listed below for reference.     Microbiology: Recent Results (from the past 240 hour(s))  Surgical  pcr screen     Status: Abnormal   Collection Time: 12/28/22 11:36 PM   Specimen: Nasal Mucosa; Nasal Swab  Result Value Ref Range Status   MRSA, PCR NEGATIVE NEGATIVE Final   Staphylococcus aureus POSITIVE (A) NEGATIVE Final    Comment: (NOTE) The Xpert SA Assay (FDA approved for NASAL specimens in patients 58 years of age and older), is one component of a comprehensive surveillance program. It is not intended to diagnose infection nor to guide or monitor treatment. Performed at [facility], [address], [place], [place] [address]   Culture, blood (Routine X 2) w Reflex to ID Panel     Status: None (Preliminary result)   Collection Time: 12/30/22  9:21 AM   Specimen: BLOOD LEFT ARM  Result Value Ref Range Status   Specimen Description BLOOD LEFT ARM  Final   Special Requests   Final    BOTTLES DRAWN AEROBIC AND ANAEROBIC Blood Culture adequate volume   Culture   Final    NO GROWTH 2 DAYS Performed at [facility], [address] [place], [place] [address]    Report Status PENDING  Incomplete  Culture, blood (Routine X 2) w Reflex to ID Panel     Status: None (Preliminary result)   Collection Time: 12/30/22  9:23 AM   Specimen: BLOOD RIGHT HAND  Result Value Ref Range Status   Specimen Description BLOOD RIGHT HAND  Final   Special Requests   Final    BOTTLES DRAWN AEROBIC AND ANAEROBIC Blood Culture adequate volume   Culture   Final    NO GROWTH 2 DAYS Performed at [facility], [address] [place], [place] [address]    Report Status PENDING  Incomplete     Labs: BNP (last 3 results) No results for input(s): "BNP" in the last 8760 hours. Basic Metabolic Panel: Recent Labs  Lab 12/27/22 0500 12/28/22 0413 12/29/22 0811 12/29/22 0815 12/29/22 1429 12/31/22 0415 12/31/22 0858  NA 135 135 137 137 134* 136  --   K 3.4* 4.0 4.0 3.9 3.8 4.1  --   CL 100 102 101  --  100 100  --   CO2 24 25  --   --  19* 25  --   GLUCOSE 86 98 100*  --  137*  93  --   BUN 7 <5* 4*  --  6 6  --   CREATININE 1.01 0.87 0.70  --  1.06 0.94  --   CALCIUM 7.3* 7.6*  --   --  7.6* 7.7*  --   MG  --  2.3  --   --   --   --  2.0   Liver Function Tests: No results for input(s): "AST", "ALT", "ALKPHOS", "BILITOT", "PROT", "ALBUMIN" in the last 168 hours. No  results for input(s): "LIPASE", "AMYLASE" in the last 168 hours. No results for input(s): "AMMONIA" in the last 168 hours. CBC: Recent Labs  Lab 12/27/22 0500 12/28/22 0413 12/29/22 0811 12/29/22 0815 12/29/22 1429 12/31/22 0415  WBC 6.5 7.5  --   --  13.9* 6.6  HGB 10.0* 10.9* 10.5* 11.6* 11.0* 10.9*  HCT 31.5* 34.5* 31.0* 34.0* 35.5* 34.6*  MCV 88.5 87.8  --   --  88.5 90.8  PLT 154 177  --   --  204 199   Cardiac Enzymes: No results for input(s): "CKTOTAL", "CKMB", "CKMBINDEX", "TROPONINI" in the last 168 hours. BNP: Invalid input(s): "POCBNP" CBG: Recent Labs  Lab 01/01/23 0716 01/01/23 1203 01/01/23 1605 01/01/23 2131 01/02/23 0731  GLUCAP 72 90 94 113* 73   D-Dimer No results for input(s): "DDIMER" in the last 72 hours. Hgb A1c No results for input(s): "HGBA1C" in the last 72 hours. Lipid Profile No results for input(s): "CHOL", "HDL", "LDLCALC", "TRIG", "CHOLHDL", "LDLDIRECT" in the last 72 hours. Thyroid function studies No results for input(s): "TSH", "T4TOTAL", "T3FREE", "THYROIDAB" in the last 72 hours.  Invalid input(s): "FREET3" Anemia work up No results for input(s): "VITAMINB12", "FOLATE", "FERRITIN", "TIBC", "IRON", "RETICCTPCT" in the last 72 hours. Urinalysis    Component Value Date/Time   COLORURINE YELLOW 12/28/2022 2352   APPEARANCEUR CLEAR 12/28/2022 2352   LABSPEC 1.005 12/28/2022 2352   PHURINE 6.0 12/28/2022 2352   GLUCOSEU NEGATIVE 12/28/2022 2352   GLUCOSEU NEGATIVE 09/23/2022 0925   HGBUR NEGATIVE 12/28/2022 2352   BILIRUBINUR NEGATIVE 12/28/2022 2352   KETONESUR 5 (A) 12/28/2022 2352   PROTEINUR NEGATIVE 12/28/2022 2352   UROBILINOGEN  1.0 09/23/2022 0925   NITRITE NEGATIVE 12/28/2022 2352   LEUKOCYTESUR NEGATIVE 12/28/2022 2352   Sepsis Labs Recent Labs  Lab 12/27/22 0500 12/28/22 0413 12/29/22 1429 12/31/22 0415  WBC 6.5 7.5 13.9* 6.6   Microbiology Recent Results (from the past 240 hour(s))  Surgical pcr screen     Status: Abnormal   Collection Time: 12/28/22 11:36 PM   Specimen: Nasal Mucosa; Nasal Swab  Result Value Ref Range Status   MRSA, PCR NEGATIVE NEGATIVE Final   Staphylococcus aureus POSITIVE (A) NEGATIVE Final    Comment: (NOTE) The Xpert SA Assay (FDA approved for NASAL specimens in patients 19 years of age and older), is one component of a comprehensive surveillance program. It is not intended to diagnose infection nor to guide or monitor treatment. Performed at [facility], [address], [place], [place] [address]   Culture, blood (Routine X 2) w Reflex to ID Panel     Status: None (Preliminary result)   Collection Time: 12/30/22  9:21 AM   Specimen: BLOOD LEFT ARM  Result Value Ref Range Status   Specimen Description BLOOD LEFT ARM  Final   Special Requests   Final    BOTTLES DRAWN AEROBIC AND ANAEROBIC Blood Culture adequate volume   Culture   Final    NO GROWTH 2 DAYS Performed at [facility], [address], [place], [place] [address]    Report Status PENDING  Incomplete  Culture, blood (Routine X 2) w Reflex to ID Panel     Status: None (Preliminary result)   Collection Time: 12/30/22  9:23 AM   Specimen: BLOOD RIGHT HAND  Result Value Ref Range Status   Specimen Description BLOOD RIGHT HAND  Final   Special Requests   Final    BOTTLES DRAWN AEROBIC AND ANAEROBIC Blood Culture adequate  volume   Culture   Final    NO GROWTH 2 DAYS Performed at [facility], [address], [place], [place] [address]    Report Status PENDING  Incomplete     Time coordinating discharge: Over 40 minutes  SIGNED:   Marinda Elk, MD  [facility] 01/02/2023, 9:02 AM Pager   If 7PM-7AM, please contact night-coverage [email or web address] Password [email or web address]

## 2023-01-03 NOTE — Care Management (Addendum)
01/03/2023  10:35: Secure message received from floor nurse to inform that patient called to report that he did not receive his IV antibiotics last night or today. Message sent to [facility] to inform of same.  1057: [facility] responded to message, reports patient IV antibiotics will be delivered today. Spoke with patient by phone to inform that his medications should be delivered sometime today.

## 2023-01-04 LAB — CULTURE, BLOOD (ROUTINE X 2)
Culture: NO GROWTH
Culture: NO GROWTH
Special Requests: ADEQUATE
Special Requests: ADEQUATE

## 2023-01-05 ENCOUNTER — Telehealth: Payer: Self-pay | Admitting: *Deleted

## 2023-01-05 NOTE — Transitions of Care (Post Inpatient/ED Visit) (Signed)
01/05/2023  Name: Jeffrey [name] MRN: [id number] DOB: January 27, 1966  Today's TOC FU Call Status: Today's TOC FU Call Status:: Successful TOC FU Call Completed TOC FU Call Complete Date: 01/05/23 Patient's Name and Date of Birth confirmed.  Transition Care Management Follow-up Telephone Call Date of Discharge: 01/02/23 Discharge Facility: [facility]) Type of Discharge: Inpatient Admission Primary Inpatient Discharge Diagnosis:: MSSA bacteremia- infected pacemaker leads/ sepsis How have you been since you were released from the hospital?: Better ("Things are going okay, no problems with the IV antibiotics; the nurse form the IV home care agency is here now.") Any questions or concerns?: No  Items Reviewed: Did you receive and understand the discharge instructions provided?: Yes (thoroughly reviewed with patient who verbalizes good understanding of same) Medications obtained,verified, and reconciled?: Partial Review Completed (Partial medication review completed; confirmed patient obtained/ is taking all newly Rx'd medications as instructed; self-manages medications and denies questions/ concerns around medications today) Reason for Partial Mediation Review: Patient declined- reports IV nurse is currently at his home; partial review completed Any new allergies since your discharge?: No Dietary orders reviewed?: Yes Type of Diet Ordered:: "As healthy as possible" Do you have support at home?: Yes People in Home: parent(s) Name of Support/Comfort Primary Source: Reports independent in self-care activities; resides with supportive parents who assists as/ if needed/ indicated  Medications Reviewed Today: Medications Reviewed Today     Reviewed by Michaela Corner, RN (Registered Nurse) on 01/05/23 at 1134  Med List Status: <None>   Medication Order Taking? Sig Documenting Provider Last Dose Status Informant  ammonium lactate (AMLACTIN) 12 % cream [id number]  Apply 1 Application  topically as needed for dry skin.  Patient taking differently: Apply 1 Application topically as needed for dry skin (right foot- affected areas).   Vivi Barrack, DPM  Active Self  atenolol (TENORMIN) 50 MG tablet [id number]  Take 1 tablet (50 mg total) by mouth daily. Plotnikov, Georgina Quint, MD  Active Self  ceFAZolin (ANCEF) IVPB [id number] Yes Inject 2 g into the vein every 8 (eight) hours. Indication:  Cardiac device infection First Dose: No Last Day of Therapy:  02/10/23 Labs - Once weekly:  CBC/D and BMP, Labs - Once weekly: ESR and CRP Method of administration: IV Push Method of administration may be changed at the discretion of home infusion pharmacist based upon assessment of the patient and/or caregiver's ability to self-administer the medication ordered. Marinda Elk, MD Taking Active   Cholecalciferol (VITAMIN D3) 2000 UNITS capsule [id number]  Take 1 capsule (2,000 Units total) by mouth daily.  Patient not taking: Reported on 12/18/2022   Plotnikov, Georgina Quint, MD  Active Self  citalopram (CELEXA) 40 MG tablet [id number]  Take 1 tablet (40 mg total) by mouth daily. Must keep 05/21/22 appt for future refills  Patient taking differently: Take 40 mg by mouth daily.   Plotnikov, Georgina Quint, MD  Active Self           Med Note Antony Madura, Harlin Rain Dec 18, 2022  2:43 PM)    clonazePAM (KLONOPIN) 1 MG tablet [id number]  Take 1 tablet (1 mg total) by mouth 2 (two) times daily as needed for anxiety. Marinda Elk, MD  Active   Continuous Blood Gluc Sensor (FREESTYLE LIBRE 3 SENSOR) [place] [id number]  1 Units by Does not apply route every 14 (fourteen) days.  Patient taking differently: Inject 1 Device into the skin every 14 (fourteen) days.   Plotnikov, Georgina Quint,  MD  Active Self           Med Note Antony Madura, Harlin Rain Dec 18, 2022  2:43 PM) There is no patch on at this time, however  ibuprofen (ADVIL,MOTRIN) 200 MG tablet [id number]  Take 400 mg by mouth every 6 (six) hours as needed  for mild pain, moderate pain or headache. [provider]  Active Self  metFORMIN (GLUCOPHAGE) 500 MG tablet [id number]  Take 1 tablet (500 mg total) by mouth 2 (two) times daily with a meal.  Patient not taking: Reported on 12/18/2022   Plotnikov, Georgina Quint, MD  Active Self  methadone (DOLOPHINE) 10 MG/ML solution [id number]  Take 150 mg by mouth in the morning. [provider]  Active Multiple Informants           Med Note Antony Madura, Harlin Rain Dec 18, 2022  2:42 PM) I called "[facility]" and confirmed the dose to be 150 mg. I spoke with "Lana". She said he last dose received at the center was yesterday, on 12/17/2022 @ 0535.  mupirocin ointment (BACTROBAN) 2 % [id number]  On leg wound w/dressing change qd or bid  Patient taking differently: Apply 1 Application topically See admin instructions. Apply to affected areas of the right foot once a week   Plotnikov, Georgina Quint, MD  Active Self  triamcinolone cream (KENALOG) 0.1 % [id number]  Apply 1 application topically 3 (three) times daily.  Patient not taking: Reported on 12/18/2022   Plotnikov, Georgina Quint, MD  Active Self  valACYclovir (VALTREX) 1000 MG tablet [id number]  Take 1 tablet (1,000 mg total) by mouth 3 (three) times daily.  Patient not taking: Reported on 12/18/2022   Plotnikov, Georgina Quint, MD  Active Self  Vitamin D, Ergocalciferol, (DRISDOL) 1.25 MG (50000 UNIT) CAPS capsule [id number]  Take 1 capsule (50,000 Units total) by mouth every 7 (seven) days.  Patient taking differently: Take 50,000 Units by mouth every Monday.   Plotnikov, Georgina Quint, MD  Active Self           Home Care and Equipment/Supplies: Were Home Health Services Ordered?: Yes Name of Home Health Agency:: [facility]- IV infusion Has Agency set up a time to come to your home?: Yes First Home Health Visit Date: 01/02/23 Any new equipment or medical supplies ordered?: No  Functional Questionnaire: Do you need  assistance with bathing/showering or dressing?: No Do you need assistance with meal preparation?: No Do you need assistance with eating?: No Do you have difficulty maintaining continence: No Do you need assistance with getting out of bed/getting out of a chair/moving?: No Do you have difficulty managing or taking your medications?: No  Follow up appointments reviewed: PCP Follow-up appointment confirmed?: Yes Date of PCP follow-up appointment?: 01/20/23 (patient declined offer to facilitate sooner scheduling for HFU, with PCP and/ or APP) Follow-up Provider: PCP Specialist Hospital Follow-up appointment confirmed?: Yes Date of Specialist follow-up appointment?: 01/27/23 Follow-Up Specialty Provider:: ID specialists Do you need transportation to your follow-up appointment?: No Do you understand care options if your condition(s) worsen?: Yes-patient verbalized understanding  SDOH Interventions Today    Flowsheet Row Most Recent Value  SDOH Interventions   Food Insecurity Interventions Intervention Not Indicated  Transportation Interventions Intervention Not Indicated  [drives self at baseline,  father and/ or sister providing transportation after recent hospitalization]      TOC Interventions Today    Flowsheet Row Most Recent Value  TOC Interventions  TOC Interventions Discussed/Reviewed TOC Interventions Discussed, Post op wound/incision care      Interventions Today    Flowsheet Row Most Recent Value  Chronic Disease   Chronic disease during today's visit Other  [Sepsis due to MSSA bacteremia- infected pacemaker leads]  General Interventions   General Interventions Discussed/Reviewed General Interventions Discussed, Doctor Visits, Communication with  Doctor Visits Discussed/Reviewed Doctor Visits Discussed, PCP, Specialist  PCP/Specialist Visits Compliance with follow-up visit  Communication with --  [[employer] plan RN CM- TOC handoff]  Education Interventions    Education Provided Provided Education  Provided Verbal Education On Insurance Plans  [[employer]-- RN CM services]  Nutrition Interventions   Nutrition Discussed/Reviewed Nutrition Discussed  Pharmacy Interventions   Pharmacy Dicussed/Reviewed Pharmacy Topics Discussed  Safety Interventions   Safety Discussed/Reviewed Safety Discussed, Fall Risk      Caryl Pina, RN, BSN, Media planner  Transitions of Care  [facility] Population [facility]  [place] [phone]: direct office

## 2023-01-06 LAB — LAB REPORT - SCANNED: EGFR: 71

## 2023-01-19 ENCOUNTER — Ambulatory Visit (HOSPITAL_COMMUNITY): Payer: MEDICAID

## 2023-01-20 ENCOUNTER — Ambulatory Visit: Payer: MEDICAID | Admitting: Internal Medicine

## 2023-01-20 LAB — LAB REPORT - SCANNED: EGFR: 77

## 2023-01-21 ENCOUNTER — Ambulatory Visit (HOSPITAL_COMMUNITY): Payer: MEDICAID

## 2023-01-26 ENCOUNTER — Ambulatory Visit: Payer: MEDICAID | Admitting: Internal Medicine

## 2023-01-27 ENCOUNTER — Other Ambulatory Visit: Payer: Self-pay

## 2023-01-27 ENCOUNTER — Ambulatory Visit (HOSPITAL_COMMUNITY): Payer: MEDICAID

## 2023-01-27 ENCOUNTER — Encounter: Payer: Self-pay | Admitting: Infectious Disease

## 2023-01-27 ENCOUNTER — Ambulatory Visit: Payer: MEDICAID | Admitting: Infectious Disease

## 2023-01-27 VITALS — BP 159/84 | HR 87 | Temp 98.6°F | Ht 73.0 in | Wt 221.0 lb

## 2023-01-27 DIAGNOSIS — B9561 Methicillin susceptible Staphylococcus aureus infection as the cause of diseases classified elsewhere: Secondary | ICD-10-CM

## 2023-01-27 DIAGNOSIS — R7881 Bacteremia: Secondary | ICD-10-CM

## 2023-01-27 DIAGNOSIS — Z7185 Encounter for immunization safety counseling: Secondary | ICD-10-CM | POA: Insufficient documentation

## 2023-01-27 DIAGNOSIS — T827XXD Infection and inflammatory reaction due to other cardiac and vascular devices, implants and grafts, subsequent encounter: Secondary | ICD-10-CM

## 2023-01-27 HISTORY — DX: Encounter for immunization safety counseling: Z71.85

## 2023-01-27 NOTE — Progress Notes (Signed)
Subjective:  Chief complaint follow-up for MSSA bacteremia and pacemaker infection status post extraction on IV antibiotics  Patient ID: Jeffrey [name], male    DOB: May 08, 1965, 57 y.o.   MRN: [id number]  HPI  57 y.o. male with  MSSA bacteremia and pacemaker infection sp extraction   Doing well and tolerating antibiotics fairly well he did have a little bit of upset stomach that improved with yogurt and probiotics.     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, Identity XL [address] 0347, serial # [id number]. [employer] Jude rep must physically interrogate pacemaker.    PTSD (post-traumatic stress disorder) 12/19/2014   On Klonopin, Celexa  Potential benefits of a long term benzodiazepines  use as well as potential risks  and complications were explained to the patient and were aknowledged.    Skin abscess 03/09/2015   11/16 - small    Substance abuse (HCC)    opioids   Tremor 07/17/2016   ?etiology     Past Surgical History:  Procedure Laterality Date   HERNIA REPAIR Right    LEAD EXTRACTION N/A 12/29/2022   Procedure: LEAD EXTRACTION;  Surgeon: Lanier Prude, MD;  Location: [facility] INVASIVE CV LAB;  Service: Cardiovascular;  Laterality: N/A;   LEFT HEART CATH AND CORONARY ANGIOGRAPHY N/A 12/25/2022   Procedure: LEFT HEART CATH AND CORONARY ANGIOGRAPHY;  Surgeon: Kathleene Hazel, MD;  Location: [facility] INVASIVE CV LAB;  Service: Cardiovascular;  Laterality: N/A;   LIVER SURGERY     PPM GENERATOR CHANGEOUT N/A 08/14/2017   Procedure: PPM GENERATOR CHANGEOUT;  Surgeon: Duke Salvia, MD;  Location: [facility] INVASIVE CV LAB;  Service:  Cardiovascular;  Laterality: N/A;   TEE WITHOUT CARDIOVERSION N/A 12/23/2022   Procedure: TRANSESOPHAGEAL ECHOCARDIOGRAM;  Surgeon: [name], MD;  Location: [facility] INVASIVE CV LAB;  Service: Cardiovascular;  Laterality: N/A;   TEE WITHOUT CARDIOVERSION N/A 12/29/2022   Procedure: TRANSESOPHAGEAL ECHOCARDIOGRAM;  Surgeon: Lanier Prude, MD;  Location: [facility] INVASIVE CV LAB;  Service: Cardiovascular;  Laterality: N/A;    Family History  Problem Relation Age of Onset   Depression Mother    Diabetes Mother    Arthritis Mother    Arthritis Father       Social History   Socioeconomic History   Marital status: Divorced    Spouse name: Not on file   Number of children: Not on file   Years of education: Not on file   Highest education level: Not on file  Occupational History   Not on file  Tobacco Use   Smoking status: Never   Smokeless tobacco: Current    Types: Chew  Vaping Use   Vaping status: Never Used  Substance and Sexual Activity   Alcohol use: No    Alcohol/week: 0.0 standard drinks of alcohol   Drug use: No    Comment: methadone clinic   Sexual activity: Not Currently  Other Topics Concern   Not on file  Social History Narrative   Not on file   Social Determinants of Health  Financial Resource Strain: Not on file  Food Insecurity: No Food Insecurity (01/05/2023)   Hunger Vital Sign    Worried About Running Out of Food in the Last Year: Never true    Ran Out of Food in the Last Year: Never true  Transportation Needs: No Transportation Needs (01/05/2023)   PRAPARE - Administrator, Civil Service (Medical): No    Lack of Transportation (Non-Medical): No  Physical Activity: Not on file  Stress: Not on file  Social Connections: Not on file    Allergies  Allergen Reactions   Codeine Itching   Penicillins Hives    Has patient had a PCN reaction causing immediate rash, facial/tongue/throat swelling, SOB or lightheadedness with hypotension:  unknown Has patient had a PCN reaction causing severe rash involving mucus membranes or skin necrosis: unknown Has patient had a PCN reaction that required hospitalization unknown Has patient had a PCN reaction occurring within the last 10 years: No If all of the above answers are "NO", then may proceed with Cephalosporin use.      Current Outpatient Medications:    ammonium lactate (AMLACTIN) 12 % cream, Apply 1 Application topically as needed for dry skin. (Patient taking differently: Apply 1 Application topically as needed for dry skin (right foot- affected areas).), Disp: 385 g, Rfl: 0   atenolol (TENORMIN) 50 MG tablet, Take 1 tablet (50 mg total) by mouth daily., Disp: 30 tablet, Rfl: 11   ceFAZolin (ANCEF) IVPB, Inject 2 g into the vein every 8 (eight) hours. Indication:  Cardiac device infection First Dose: No Last Day of Therapy:  02/10/23 Labs - Once weekly:  CBC/D and BMP, Labs - Once weekly: ESR and CRP Method of administration: IV Push Method of administration may be changed at the discretion of home infusion pharmacist based upon assessment of the patient and/or caregiver's ability to self-administer the medication ordered., Disp: 60 Units, Rfl: 0   Cholecalciferol (VITAMIN D3) 2000 UNITS capsule, Take 1 capsule (2,000 Units total) by mouth daily. (Patient not taking: Reported on 12/18/2022), Disp: 100 capsule, Rfl: 3   citalopram (CELEXA) 40 MG tablet, Take 1 tablet (40 mg total) by mouth daily. Must keep 05/21/22 appt for future refills (Patient taking differently: Take 40 mg by mouth daily.), Disp: 30 tablet, Rfl: 5   clonazePAM (KLONOPIN) 1 MG tablet, Take 1 tablet (1 mg total) by mouth 2 (two) times daily as needed for anxiety., Disp: , Rfl:    Continuous Blood Gluc Sensor (FREESTYLE LIBRE 3 SENSOR) MISC, 1 Units by Does not apply route every 14 (fourteen) days. (Patient taking differently: Inject 1 Device into the skin every 14 (fourteen) days.), Disp: 6 each, Rfl: 3   ibuprofen  (ADVIL,MOTRIN) 200 MG tablet, Take 400 mg by mouth every 6 (six) hours as needed for mild pain, moderate pain or headache., Disp: , Rfl:    metFORMIN (GLUCOPHAGE) 500 MG tablet, Take 1 tablet (500 mg total) by mouth 2 (two) times daily with a meal. (Patient not taking: Reported on 12/18/2022), Disp: 180 tablet, Rfl: 3   methadone (DOLOPHINE) 10 MG/ML solution, Take 150 mg by mouth in the morning., Disp: , Rfl:    mupirocin ointment (BACTROBAN) 2 %, On leg wound w/dressing change qd or bid (Patient taking differently: Apply 1 Application topically See admin instructions. Apply to affected areas of the right foot once a week), Disp: 30 g, Rfl: 0   triamcinolone cream (KENALOG) 0.1 %, Apply 1 application topically 3 (three) times daily. (Patient  not taking: Reported on 12/18/2022), Disp: 45 g, Rfl: 1   valACYclovir (VALTREX) 1000 MG tablet, Take 1 tablet (1,000 mg total) by mouth 3 (three) times daily. (Patient not taking: Reported on 12/18/2022), Disp: 21 tablet, Rfl: 0   Vitamin D, Ergocalciferol, (DRISDOL) 1.25 MG (50000 UNIT) CAPS capsule, Take 1 capsule (50,000 Units total) by mouth every 7 (seven) days. (Patient taking differently: Take 50,000 Units by mouth every Monday.), Disp: 8 capsule, Rfl: 0 No current facility-administered medications for this visit.  Facility-Administered Medications Ordered in Other Visits:    lactated ringers infusion, , Intravenous, Continuous PRN, Nils Pyle, CRNA, New Bag at 12/29/22 [phone]   Review of Systems  Constitutional:  Negative for activity change, appetite change, chills, diaphoresis, fatigue, fever and unexpected weight change.  HENT:  Negative for congestion, rhinorrhea, sinus pressure, sneezing, sore throat and trouble swallowing.   Eyes:  Negative for photophobia and visual disturbance.  Respiratory:  Negative for cough, chest tightness, shortness of breath, wheezing and stridor.   Cardiovascular:  Negative for chest pain, palpitations and leg swelling.   Gastrointestinal:  Negative for abdominal distention, abdominal pain, anal bleeding, blood in stool, constipation, diarrhea, nausea and vomiting.  Genitourinary:  Negative for difficulty urinating, dysuria, flank pain and hematuria.  Musculoskeletal:  Negative for arthralgias, back pain, gait problem, joint swelling and myalgias.  Skin:  Negative for color change, pallor, rash and wound.  Neurological:  Negative for dizziness, tremors, weakness and light-headedness.  Hematological:  Negative for adenopathy. Does not bruise/bleed easily.  Psychiatric/Behavioral:  Negative for agitation, behavioral problems, confusion, decreased concentration, dysphoric mood and sleep disturbance.    Does have a bandage overlying where his pacemaker was extracted.  He says the site is doing well and not showing any evidence of infection    Objective:   Physical Exam Constitutional:      Appearance: He is well-developed.  HENT:     Head: Normocephalic and atraumatic.  Eyes:     Conjunctiva/sclera: Conjunctivae normal.  Cardiovascular:     Rate and Rhythm: Normal rate and regular rhythm.  Pulmonary:     Effort: Pulmonary effort is normal. No respiratory distress.     Breath sounds: No wheezing.  Abdominal:     General: There is no distension.     Palpations: Abdomen is soft.  Musculoskeletal:        General: No tenderness. Normal range of motion.     Cervical back: Normal range of motion and neck supple.  Skin:    General: Skin is warm and dry.     Coloration: Skin is not pale.     Findings: No erythema or rash.  Neurological:     General: No focal deficit present.     Mental Status: He is alert and oriented to person, place, and time.  Psychiatric:        Mood and Affect: Mood normal.        Behavior: Behavior normal.        Thought Content: Thought content normal.        Judgment: Judgment normal.           Assessment & Plan:  This is a bacteremia with pacemaker infection status post  device extraction:  Will plan on completing a 6 weeks of cefazolin on November 19.  Then bring him back for surveillance blood cultures on 4 December which is more than 2 weeks after stopping antibiotics.  Vaccine counseling recommended COVID-19 updated vaccine as well as updated  flu vaccine but he like to pursue this with his PCP Dr. Posey Rea  I have personally spent 40 minutes involved in face-to-face and non-face-to-face activities for this patient on the day of the visit. Professional time spent includes the following activities: Preparing to see the patient (review of tests), Obtaining and/or reviewing separately obtained history (admission/discharge record), Performing a medically appropriate examination and/or evaluation , Ordering medications/tests/procedures, referring and communicating with other health care professionals, Documenting clinical information in the EMR, Independently interpreting results (not separately reported), Communicating results to the patient/family/caregiver, Counseling and educating the patient/family/caregiver and Care coordination (not separately reported).

## 2023-01-29 ENCOUNTER — Ambulatory Visit (HOSPITAL_COMMUNITY): Payer: MEDICAID

## 2023-02-05 ENCOUNTER — Telehealth: Payer: Self-pay

## 2023-02-05 ENCOUNTER — Ambulatory Visit: Payer: MEDICAID | Admitting: Internal Medicine

## 2023-02-05 NOTE — Telephone Encounter (Signed)
Lovenia Shuck, RN with [facility] called to report patient missed nurse visit to change PICC dressing and labs this week. Patient is scheduled for nurse visit on 11/18. RN wanted to notify provider.    Yolander Goodie Lesli Albee, CMA

## 2023-02-05 NOTE — Telephone Encounter (Signed)
Left VM asking patient to return to be sure he is still taking IV abx.

## 2023-02-05 NOTE — Telephone Encounter (Signed)
Patient returned my call. Patient is still taking IV abx.    Kannen Moxey Lesli Albee, CMA

## 2023-02-11 NOTE — Telephone Encounter (Signed)
Patient aware [facility] nurse will contact him regarding PICC line removal. Patient voiced his understanding.    Message sent to Jeri Modena, RN/[facility] and [facility] pharmacists.   Kynzlie Hilleary Lesli Albee, CMA

## 2023-02-11 NOTE — Telephone Encounter (Signed)
Patient called wanting to know when his PICC line is to be removed. Routing to provider to confirm end date of 02/10/2023?

## 2023-02-12 ENCOUNTER — Telehealth: Payer: Self-pay | Admitting: Internal Medicine

## 2023-02-12 ENCOUNTER — Ambulatory Visit: Payer: MEDICAID | Admitting: Internal Medicine

## 2023-02-12 NOTE — Telephone Encounter (Signed)
Next OV is 02/17/2023.   Prescription Request  02/12/2023  LOV: 09/23/2022  What is the name of the medication or equipment? clonazePAM (KLONOPIN) 1 MG tablet   Have you contacted your pharmacy to request a refill? No   Which pharmacy would you like this sent to?  [id number] - [address] [place] - [address] [place] [address] Phone: [phone] Fax: [phone]    Patient notified that their request is being sent to the clinical staff for review and that they should receive a response within 2 business days.   Please advise at [facility] [phone]

## 2023-02-13 NOTE — Telephone Encounter (Signed)
Patient called to check on the status of his request. Best callback is [phone].

## 2023-02-15 MED ORDER — CLONAZEPAM 1 MG PO TABS: 1.0000 mg | ORAL_TABLET | Freq: Two times a day (BID) | ORAL | 1 refills | Status: DC | PRN

## 2023-02-15 NOTE — Telephone Encounter (Signed)
Okay.  Thanks.

## 2023-02-17 ENCOUNTER — Ambulatory Visit: Payer: MEDICAID | Admitting: Internal Medicine

## 2023-02-23 ENCOUNTER — Ambulatory Visit: Payer: MEDICAID | Admitting: Internal Medicine

## 2023-02-23 ENCOUNTER — Encounter: Payer: Self-pay | Admitting: Internal Medicine

## 2023-02-23 VITALS — BP 130/80 | HR 102 | Temp 98.0°F | Ht 73.0 in | Wt 220.0 lb

## 2023-02-23 DIAGNOSIS — B9561 Methicillin susceptible Staphylococcus aureus infection as the cause of diseases classified elsewhere: Secondary | ICD-10-CM

## 2023-02-23 DIAGNOSIS — R7881 Bacteremia: Secondary | ICD-10-CM | POA: Diagnosis not present

## 2023-02-23 DIAGNOSIS — F4310 Post-traumatic stress disorder, unspecified: Secondary | ICD-10-CM

## 2023-02-23 DIAGNOSIS — R634 Abnormal weight loss: Secondary | ICD-10-CM | POA: Diagnosis not present

## 2023-02-23 DIAGNOSIS — R251 Tremor, unspecified: Secondary | ICD-10-CM

## 2023-02-23 DIAGNOSIS — E1165 Type 2 diabetes mellitus with hyperglycemia: Secondary | ICD-10-CM | POA: Diagnosis not present

## 2023-02-23 DIAGNOSIS — E669 Obesity, unspecified: Secondary | ICD-10-CM

## 2023-02-23 DIAGNOSIS — F1121 Opioid dependence, in remission: Secondary | ICD-10-CM | POA: Diagnosis not present

## 2023-02-23 DIAGNOSIS — I4581 Long QT syndrome: Secondary | ICD-10-CM

## 2023-02-23 LAB — CBC WITH DIFFERENTIAL/PLATELET
Basophils Absolute: 0 10*3/uL (ref 0.0–0.1)
Basophils Relative: 0.2 % (ref 0.0–3.0)
Eosinophils Absolute: 0 10*3/uL (ref 0.0–0.7)
Eosinophils Relative: 0.4 % (ref 0.0–5.0)
HCT: 37.3 % — ABNORMAL LOW (ref 39.0–52.0)
Hemoglobin: 12.3 g/dL — ABNORMAL LOW (ref 13.0–17.0)
Lymphocytes Relative: 19.4 % (ref 12.0–46.0)
Lymphs Abs: 2.3 10*3/uL (ref 0.7–4.0)
MCHC: 33 g/dL (ref 30.0–36.0)
MCV: 86.9 fL (ref 78.0–100.0)
Monocytes Absolute: 0.7 10*3/uL (ref 0.1–1.0)
Monocytes Relative: 5.9 % (ref 3.0–12.0)
Neutro Abs: 8.7 10*3/uL — ABNORMAL HIGH (ref 1.4–7.7)
Neutrophils Relative %: 74.1 % (ref 43.0–77.0)
Platelets: 264 10*3/uL (ref 150.0–400.0)
RBC: 4.3 Mil/uL (ref 4.22–5.81)
RDW: 14.3 % (ref 11.5–15.5)
WBC: 11.7 10*3/uL — ABNORMAL HIGH (ref 4.0–10.5)

## 2023-02-23 LAB — COMPREHENSIVE METABOLIC PANEL
ALT: 6 U/L (ref 0–53)
AST: 13 U/L (ref 0–37)
Albumin: 3.6 g/dL (ref 3.5–5.2)
Alkaline Phosphatase: 68 U/L (ref 39–117)
BUN: 12 mg/dL (ref 6–23)
CO2: 28 meq/L (ref 19–32)
Calcium: 8.7 mg/dL (ref 8.4–10.5)
Chloride: 98 meq/L (ref 96–112)
Creatinine, Ser: 1.04 mg/dL (ref 0.40–1.50)
GFR: 78.44 mL/min (ref >60.00)
Glucose, Bld: 116 mg/dL — ABNORMAL HIGH (ref 70–99)
Potassium: 3.3 meq/L — ABNORMAL LOW (ref 3.5–5.1)
Sodium: 138 meq/L (ref 135–145)
Total Bilirubin: 0.8 mg/dL (ref 0.2–1.2)
Total Protein: 7.2 g/dL (ref 6.0–8.3)

## 2023-02-23 LAB — HEMOGLOBIN A1C: Hgb A1c MFr Bld: 5.7 % (ref 4.6–6.5)

## 2023-02-23 LAB — TSH: TSH: 3.35 u[IU]/mL (ref 0.35–5.50)

## 2023-02-23 MED ORDER — CLONAZEPAM 1 MG PO TABS: 1.5000 mg | ORAL_TABLET | Freq: Two times a day (BID) | ORAL | 1 refills | Status: DC | PRN

## 2023-02-23 NOTE — Assessment & Plan Note (Addendum)
Pacemaker and wires were removed due  to Staph infection/sepsis 11/2022 Pt finished IV abx, PICC is out 02/13/23

## 2023-02-23 NOTE — Assessment & Plan Note (Signed)
Lost 30 lbs w/sepsis. Supplement protein

## 2023-02-23 NOTE — Assessment & Plan Note (Signed)
On Methadone since 1993 - Methadone clinic - 4 days a week Drug screens at [facility] monthly 

## 2023-02-23 NOTE — Assessment & Plan Note (Signed)
Pacemaker and wires were removed due  to Staph infection/sepsis 11/2022 Pt finished IV abx

## 2023-02-23 NOTE — Assessment & Plan Note (Signed)
No change - L side: L tongue and lip trembling

## 2023-02-23 NOTE — Assessment & Plan Note (Signed)
Worse. Increase clonazepam to 1.5 mg twice daily as needed.  Potential benefits of a long term benzodiazepines  use as well as potential risks  and complications were explained to the patient and were aknowledged.

## 2023-02-23 NOTE — Assessment & Plan Note (Addendum)
Not taking Metformin Risks associated with treatment noncompliance were discussed. Compliance was encouraged.  Obtain labs including hemoglobin A1c

## 2023-02-23 NOTE — Assessment & Plan Note (Signed)
Pacemaker and wires were removed due  to Staph infection/sepsis 11/2022

## 2023-02-23 NOTE — Progress Notes (Signed)
Subjective:  Patient ID: Jeffrey [name], male    DOB: 1965/05/08  Age: 57 y.o. MRN: [id number]  CC: Hospitalization Follow-up   HPI Jeffrey [name] presents for a post-hosp f/u C/o wt loss. C/o more anxiety PICC is out   Admit date: 12/18/2022 Discharge date: 01/02/2023   Admitted From: Home Disposition:  Home   Recommendations for Outpatient Follow-up:  Follow up with PCP in 1-2 weeks Please obtain BMP/CBC in one week Home health to follow-up and continue IV antibiotics. He will follow-up with ID as an outpatient.  In the day treatment 02/10/2023   Home Health:Yes Equipment/Devices:None   Discharge Condition:Stable CODE STATUS:Full Diet recommendation: Heart Healthy   Brief/Interim Summary:  57 y.o. 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    Discharge Diagnoses:  Principal Problem:   MSSA bacteremia Active Problems:   Sepsis (HCC)   Shock (HCC)   Hypovolemia   Pacemaker infection (HCC)   Acute pain of right knee   Sepsis secondary to pacemaker infection leads/MSSA bacteremia: Left heart cath on 12/25/2022 was done and removal of pacer leads was done on 12/29/2022. ID was consulted and recommend repeat blood cultures 12/29/2022 post device extraction and cultures are remain negative till date. He also recommended to continue IV cefazolin for 6 weeks end of the treatment 02/10/2023. Physical therapy evaluated the patient recommended home health PT. PICC line was inserted on 01/02/2023.   Acute thrombocytopenia on admission likely that mild DIC: Now  resolved.   Acute kidney injury/hyponatremia/hyperkalemia/metabolic acidosis: Resolved with IV fluids.   Prolonged QTc: Status post pacemaker removal by EP on 12/29/2022. Follow-up with EP as an outpatient.   Obesity: Noted.   Diabetes mellitus type 2: With a last A1c of 10.6 he required minimal insulin in house he will continue his current treatment as an outpatient as no changes made.   Essential hypertension: He can resume his antihypertensive medication as an outpatient as no changes made.   Chronic pain/methadone dependence: No changes made continue methadone as an outpatient.       Discharge Instructions   Discharge Instructions       [facility] pharmacist to adjust dose for Vancomycin, Aminoglycosides and other anti-infective therapies as requested by physician.   Complete by: As directed      [facility] to provide Cath Flo 2mg    Complete by: As directed      Administer for PICC line occlusion and as ordered by physician for other access device issues.    Anaphylaxis Kit: Provided to treat any anaphylactic reaction to the medication being provided to the patient if First Dose or when requested by physician   Complete by: As directed      Epinephrine 1mg /ml vial / amp: Administer 0.3mg  (0.84ml) subcutaneously once for moderate to severe anaphylaxis, nurse to call physician and pharmacy when reaction occurs and call 911 if needed for immediate care    Diphenhydramine 50mg /ml IV vial: Administer 25-50mg  IV/IM PRN for first dose reaction, rash, itching, mild reaction, nurse to call physician and pharmacy when reaction occurs    Sodium Chloride 0.9% NS IV: Administer  if needed for hypovolemic blood pressure drop or as ordered by physician after call to physician with anaphylactic reaction    Change dressing on IV access line weekly and PRN   Complete by: As directed      Diet - low sodium heart healthy   Complete by: As directed      Flush IV  access with Sodium Chloride 0.9% and Heparin 10 units/ml or 100 units/ml   Complete by: As directed      Home infusion instructions - [facility]   Complete by: As directed      Instructions: Flush IV access with Sodium Chloride 0.9% and Heparin 10units/ml or 100units/ml    Change dressing on IV access line: Weekly and PRN    Instructions Cath Flo 2mg : Administer for PICC Line occlusion and as ordered by physician for other access device    [facility] pharmacist to adjust dose for: Vancomycin, Aminoglycosides and other anti-infective therapies as requested by physician    Increase activity slowly   Complete by: As directed      Method of administration may be changed at the discretion of home infusion pharmacist based upon assessment of the patient and/or caregiver's ability to self-administer the medication ordered   Complete by: As directed      No wound care   Complete by: As directed           Allergies as of 01/02/2023         Reactions    Codeine Itching    Penicillins Hives    Has patient had a PCN reaction causing immediate rash, facial/tongue/throat swelling, SOB or lightheadedness with hypotension: unknown Has patient had a PCN reaction causing severe rash involving mucus membranes or skin necrosis: unknown Has patient had a PCN reaction that required hospitalization unknown Has patient had a PCN reaction occurring within the last 10 years: No If all of the above answers are "NO", then may proceed with Cephalosporin use.              Outpatient Medications Prior to Visit  Medication Sig Dispense Refill   ammonium lactate (AMLACTIN) 12 % cream Apply 1 Application topically as needed for dry skin. (Patient taking differently: Apply 1 Application topically as needed for dry skin (right foot- affected areas).) 385 g 0   atenolol (TENORMIN) 50 MG tablet Take 1 tablet (50 mg total) by mouth daily. 30 tablet 11   ceFAZolin (ANCEF) IVPB Inject 2 g into  the vein every 8 (eight) hours. Indication:  Cardiac device infection First Dose: No Last Day of Therapy:  02/10/23 Labs - Once weekly:  CBC/D and BMP, Labs - Once weekly: ESR and CRP Method of administration: IV Push Method of administration may be changed at the discretion of home infusion pharmacist based upon assessment of the patient and/or caregiver's ability to self-administer the medication ordered. 60 Units 0   Continuous Blood Gluc Sensor (FREESTYLE LIBRE 3 SENSOR) MISC 1 Units by Does not apply route every 14 (fourteen) days. (Patient taking differently: Inject 1 Device into the skin every 14 (fourteen) days.) 6 each 3   ibuprofen (ADVIL,MOTRIN) 200 MG tablet Take 400 mg by mouth every 6 (six) hours as needed for mild pain, moderate pain or headache.     mupirocin ointment (BACTROBAN) 2 % On leg wound w/dressing change qd or bid (Patient taking differently: Apply 1 Application topically See admin instructions. Apply to affected areas of the right foot once a week) 30  g 0   Vitamin D, Ergocalciferol, (DRISDOL) 1.25 MG (50000 UNIT) CAPS capsule Take 1 capsule (50,000 Units total) by mouth every 7 (seven) days. (Patient taking differently: Take 50,000 Units by mouth every Monday.) 8 capsule 0   citalopram (CELEXA) 40 MG tablet Take 1 tablet (40 mg total) by mouth daily. Must keep 05/21/22 appt for future refills (Patient taking differently: Take 40 mg by mouth daily.) 30 tablet 5   clonazePAM (KLONOPIN) 1 MG tablet Take 1 tablet (1 mg total) by mouth 2 (two) times daily as needed for anxiety. 60 tablet 1   methadone (DOLOPHINE) 10 MG/ML solution Take 150 mg by mouth in the morning.     Cholecalciferol (VITAMIN D3) 2000 UNITS capsule Take 1 capsule (2,000 Units total) by mouth daily. (Patient not taking: Reported on 12/18/2022) 100 capsule 3   metFORMIN (GLUCOPHAGE) 500 MG tablet Take 1 tablet (500 mg total) by mouth 2 (two) times daily with a meal. (Patient not taking: Reported on 12/18/2022) 180  tablet 3   triamcinolone cream (KENALOG) 0.1 % Apply 1 application topically 3 (three) times daily. (Patient not taking: Reported on 12/18/2022) 45 g 1   valACYclovir (VALTREX) 1000 MG tablet Take 1 tablet (1,000 mg total) by mouth 3 (three) times daily. (Patient not taking: Reported on 12/18/2022) 21 tablet 0   Facility-Administered Medications Prior to Visit  Medication Dose Route Frequency Provider Last Rate Last Admin   lactated ringers infusion   Intravenous Continuous PRN Nils Pyle, CRNA   New Bag at 12/29/22 0650    ROS: Review of Systems  Constitutional:  Positive for fatigue. Negative for appetite change and unexpected weight change.  HENT:  Negative for congestion, nosebleeds, sneezing, sore throat and trouble swallowing.   Eyes:  Negative for itching and visual disturbance.  Respiratory:  Negative for cough.   Cardiovascular:  Negative for chest pain, palpitations and leg swelling.  Gastrointestinal:  Negative for abdominal distention, blood in stool, diarrhea and nausea.  Genitourinary:  Negative for frequency and hematuria.  Musculoskeletal:  Positive for arthralgias and gait problem. Negative for back pain, joint swelling and neck pain.  Skin:  Negative for rash.  Neurological:  Negative for dizziness, tremors, speech difficulty and weakness.  Psychiatric/Behavioral:  Negative for agitation, dysphoric mood, sleep disturbance and suicidal ideas. The patient is not nervous/anxious.     Objective:  BP 130/80 (BP Location: Left Arm, Patient Position: Sitting, Cuff Size: Normal)   Pulse (!) 102   Temp 98 F (36.7 C) (Oral)   Ht 6\' 1"  (1.854 m)   Wt 220 lb (99.8 kg)   SpO2 96%   BMI 29.03 kg/m   BP Readings from Last 3 Encounters:  02/23/23 130/80  01/27/23 (!) 159/84  01/02/23 114/60    Wt Readings from Last 3 Encounters:  02/23/23 220 lb (99.8 kg)  01/27/23 221 lb (100.2 kg)  12/29/22 250 lb (113.4 kg)    Physical Exam Constitutional:      General: He is  not in acute distress.    Appearance: He is well-developed. He is obese.     Comments: NAD  Eyes:     Conjunctiva/sclera: Conjunctivae normal.     Pupils: Pupils are equal, round, and reactive to light.  Neck:     Thyroid: No thyromegaly.     Vascular: No JVD.  Cardiovascular:     Rate and Rhythm: Normal rate and regular rhythm.     Heart sounds: Normal heart sounds. No murmur heard.  No friction rub. No gallop.  Pulmonary:     Effort: Pulmonary effort is normal. No respiratory distress.     Breath sounds: Normal breath sounds. No wheezing or rales.  Chest:     Chest wall: No tenderness.  Abdominal:     General: Bowel sounds are normal. There is no distension.     Palpations: Abdomen is soft. There is no mass.     Tenderness: There is no abdominal tenderness. There is no guarding or rebound.  Musculoskeletal:        General: Tenderness present. Normal range of motion.     Cervical back: Normal range of motion.     Right lower leg: No edema.     Left lower leg: No edema.  Lymphadenopathy:     Cervical: No cervical adenopathy.  Skin:    General: Skin is warm and dry.     Findings: No rash.  Neurological:     Mental Status: He is alert and oriented to person, place, and time.     Cranial Nerves: No cranial nerve deficit.     Motor: No abnormal muscle tone.     Coordination: Coordination normal.     Gait: Gait abnormal.     Deep Tendon Reflexes: Reflexes are normal and symmetric.  Psychiatric:        Behavior: Behavior normal.        Thought Content: Thought content normal.        Judgment: Judgment normal.   R knee hurts Using a cane  Lab Results  Component Value Date   WBC 6.6 12/31/2022   HGB 10.9 (L) 12/31/2022   HCT 34.6 (L) 12/31/2022   PLT 199 12/31/2022   GLUCOSE 93 12/31/2022   CHOL 162 09/23/2022   TRIG 143.0 09/23/2022   HDL 25.90 (L) 09/23/2022   LDLCALC 107 (H) 09/23/2022   ALT 12 12/26/2022   AST 37 12/26/2022   NA 136 12/31/2022   K 4.1  12/31/2022   CL 100 12/31/2022   CREATININE 0.94 12/31/2022   BUN 6 12/31/2022   CO2 25 12/31/2022   TSH 3.17 09/23/2022   PSA 0.19 06/16/2008   INR 1.2 12/28/2022   HGBA1C 10.7 (H) 12/28/2022   MICROALBUR 0.9 09/23/2022    ECHOCARDIOGRAM COMPLETE  Result Date: 12/19/2022    ECHOCARDIOGRAM REPORT   Patient Name:   Jeffrey [name] Date of Exam: 12/19/2022 Medical Rec #:  [id number]         Height:       73.0 in Accession #:    [id number]        Weight:       245.8 lb Date of Birth:  March 13, 1966         BSA:          2.349 m Patient Age:    57 years          BP:           117/55 mmHg Patient Gender: M                 HR:           80 bpm. Exam Location:  Inpatient Procedure: 2D Echo, Cardiac Doppler and Color Doppler Indications:    Shock  History:        Patient has no prior history of Echocardiogram examinations.                 Pacemaker.  Sonographer:    Darlys Gales  Referring Phys: [id number] Clarene Critchley [facility]  Sonographer Comments: No apical window and no subcostal window. Technically difficult study due to patient position and body habitus. IMPRESSIONS  1. Left ventricular ejection fraction, by estimation, is 65 to 70%. The left ventricle has normal function. The left ventricle has no regional wall motion abnormalities. Left ventricular diastolic function could not be evaluated.  2. Right ventricular systolic function is normal. The right ventricular size is normal.  3. The mitral valve is normal in structure. No evidence of mitral valve regurgitation. No evidence of mitral stenosis.  4. The aortic valve is tricuspid. Aortic valve regurgitation is not visualized. No aortic stenosis is present.  5. The inferior vena cava is normal in size with greater than 50% respiratory variability, suggesting right atrial pressure of 3 mmHg. FINDINGS  Left Ventricle: Left ventricular ejection fraction, by estimation, is 65 to 70%. The left ventricle has normal function. The left ventricle has no regional wall motion  abnormalities. The left ventricular internal cavity size was normal in size. There is  no left ventricular hypertrophy. Left ventricular diastolic function could not be evaluated. Right Ventricle: The right ventricular size is normal. No increase in right ventricular wall thickness. Right ventricular systolic function is normal. Left Atrium: Left atrial size was normal in size. Right Atrium: Right atrial size was normal in size. Pericardium: There is no evidence of pericardial effusion. Mitral Valve: The mitral valve is normal in structure. No evidence of mitral valve regurgitation. No evidence of mitral valve stenosis. Tricuspid Valve: The tricuspid valve is normal in structure. Tricuspid valve regurgitation is trivial. No evidence of tricuspid stenosis. Aortic Valve: The aortic valve is tricuspid. Aortic valve regurgitation is not visualized. No aortic stenosis is present. Pulmonic Valve: The pulmonic valve was normal in structure. Pulmonic valve regurgitation is not visualized. No evidence of pulmonic stenosis. Aorta: The aortic root is normal in size and structure. Venous: The inferior vena cava is normal in size with greater than 50% respiratory variability, suggesting right atrial pressure of 3 mmHg. IAS/Shunts: No atrial level shunt detected by color flow Doppler.  LEFT VENTRICLE PLAX 2D LVIDd:         3.70 cm LVIDs:         2.10 cm LV PW:         1.20 cm LV IVS:        1.00 cm LVOT diam:     2.00 cm LVOT Area:     3.14 cm   AORTA Ao Root diam: 3.30 cm TRICUSPID VALVE TR Peak grad:   11.3 mmHg TR Vmax:        168.00 cm/s  SHUNTS Systemic Diam: 2.00 cm Chilton Si MD Electronically signed by Chilton Si MD Signature Date/Time: 12/19/2022/11:20:30 AM    Final    DG CHEST PORT 1 VIEW  Result Date: 12/18/2022 CLINICAL DATA:  Central line placement. EXAM: PORTABLE CHEST 1 VIEW COMPARISON:  Radiograph and CT earlier today FINDINGS: Significant patient rotation. New right internal jugular central  venous catheter tip overlies the mid SVC. No pneumothorax. Stable heart size and mediastinal contours allowing for rotation. Development of vascular congestion. No focal airspace disease. No pleural fluid or pneumothorax. No acute osseous findings. Left-sided pacemaker remains in place. IMPRESSION: 1. New right internal jugular central venous catheter tip overlies the mid SVC. No pneumothorax. 2. Development of vascular congestion. Electronically Signed   By: Narda Rutherford M.D.   On: 12/18/2022 17:35   CT CHEST ABDOMEN PELVIS W CONTRAST  Result Date: 12/18/2022 CLINICAL DATA:  Sepsis. EXAM: CT CHEST, ABDOMEN, AND PELVIS WITH CONTRAST TECHNIQUE: Multidetector CT imaging of the chest, abdomen and pelvis was performed following the standard protocol during bolus administration of intravenous contrast. RADIATION DOSE REDUCTION: This exam was performed according to the departmental dose-optimization program which includes automated exposure control, adjustment of the mA and/or kV according to patient size and/or use of iterative reconstruction technique. CONTRAST:  OMNIPAQUE IOHEXOL 300 MG/ML  SOLN COMPARISON:  None Available. FINDINGS: CT CHEST FINDINGS Cardiovascular: Normal heart size. No pericardial effusion. There is a left chest wall dual lead pacer. Mediastinum/Nodes: No enlarged mediastinal, hilar, or axillary lymph nodes. Thyroid gland, trachea, and esophagus demonstrate no significant findings. Lungs/Pleura: No pleural fluid or pneumothorax. No airspace consolidation identified. Mosaic attenuation pattern is identified within both lower lung zones. Mild bilateral lower lobe bronchial wall thickening. Musculoskeletal: No chest wall mass or suspicious bone lesions identified. Bilateral gynecomastia. CT ABDOMEN PELVIS FINDINGS Hepatobiliary: No suspicious liver abnormality. Subjective hepatic steatosis. Gallbladder appears normal. There is no bile duct dilatation. Pancreas: Unremarkable. No pancreatic  ductal dilatation or surrounding inflammatory changes. Spleen: Spleen measures 15.3 cm in cranial caudal dimension. No focal splenic lesion. Adrenals/Urinary Tract: Normal adrenal glands. Simple cyst arises off the upper pole of the left kidney measuring 1.2 cm. Cyst off the inferior pole of the left kidney measures 1 cm. No follow-up imaging recommended. No signs of nephrolithiasis or hydronephrosis. Urinary bladder is unremarkable. Stomach/Bowel: Stomach is normal. The appendix is visualized and is within normal limits. No pathologic dilatation of the large or small bowel loops. Proximal transverse colon appears decompressed with mucosal enhancement and wall thickening without surrounding inflammatory fat stranding, image 134/8. Intramural fatty deposition is identified involving the ileum and ascending colon. Vascular/Lymphatic: Mild aortic atherosclerosis. Porta hepatic lymph node measures 1.2 cm, image 58/2. Previously 1 cm. Portocaval lymph node measures 1.1 cm, image 56/2. Previously 0.8 cm. No pelvic or inguinal adenopathy. Reproductive: Prostate is unremarkable. Other: No ascites or focal fluid collections. Laxity of the midline ventral abdominal wall identified. No signs of pneumoperitoneum. Musculoskeletal: No acute or suspicious osseous findings. L5-S1 degenerative disc disease. IMPRESSION: 1. No acute findings within the chest, abdomen or pelvis. 2. Mosaic attenuation pattern within both lower lung zones with mild bilateral lower lobe bronchial wall thickening. Findings are nonspecific but may be seen in the setting of small airways disease. 3. Proximal transverse colon appears decompressed with mucosal enhancement and wall thickening without surrounding inflammatory fat stranding. Findings are nonspecific and may be related to underdistention. Correlate for any clinical signs or symptoms of colitis. 4. Intramural fatty deposition is identified involving the ileum and ascending colon. This is a  nonspecific finding but can be seen in the setting of chronic inflammatory bowel disease. 5. Hepatic steatosis. 6. Splenomegaly. 7. Mildly enlarged porta hepatic and portocaval lymph nodes. These are nonspecific and may be reactive. 8.  Aortic Atherosclerosis (ICD10-I70.0). Electronically Signed   By: Signa Kell M.D.   On: 12/18/2022 07:46   DG Chest Port 1 View  Result Date: 12/18/2022 CLINICAL DATA:  57 year old male with suspected meningococcal sepsis. Weakness. EXAM: PORTABLE CHEST 1 VIEW COMPARISON:  Chest radiographs 07/11/2017 and earlier. FINDINGS: Portable AP semi upright view at 0304 hours. Low lung volumes and mildly rotated to the left. Stable chronic left chest dual lead pacemaker. Stable cardiac size and mediastinal contours. Allowing for low lung volumes, Allowing for portable technique the lungs are clear. No pneumothorax, pulmonary edema, pleural effusion, or  consolidation identified. No acute osseous abnormality identified. Paucity of bowel gas in the visible abdomen. IMPRESSION: Low lung volumes, otherwise no acute cardiopulmonary abnormality. Electronically Signed   By: Odessa Fleming M.D.   On: 12/18/2022 04:23    Assessment & Plan:   Problem List Items Addressed This Visit     Long Q-T syndrome    Pacemaker and wires were removed due  to Staph infection/sepsis 11/2022 Pt finished IV abx      PTSD (post-traumatic stress disorder)    Worse. Increase clonazepam to 1.5 mg twice daily as needed.  Potential benefits of a long term benzodiazepines  use as well as potential risks  and complications were explained to the patient and were aknowledged.      Opioid dependence (HCC) - Primary    On Methadone since 1993 - Methadone clinic - 4 days a week Drug screens at [facility] monthly      Tremor    No change - L side: L tongue and lip trembling      Relevant Orders   TSH   Obesity (BMI 35.0-39.9 without comorbidity)    Lost 30 lbs w/sepsis. Supplement protein        Type 2 diabetes mellitus with hyperglycemia (HCC)    Not taking Metformin Risks associated with treatment noncompliance were discussed. Compliance was encouraged.  Obtain labs including hemoglobin A1c       Relevant Orders   Comprehensive metabolic panel   Hemoglobin A1c   MSSA bacteremia    Pacemaker and wires were removed due  to Staph infection/sepsis 11/2022 Pt finished IV abx, PICC is out 02/13/23      Relevant Orders   CBC with Differential/Platelet   Weight loss    Lost 30 lbs w/sepsis. Supplement protein      Relevant Orders   CBC with Differential/Platelet   TSH   Hemoglobin A1c      Meds ordered this encounter  Medications   clonazePAM (KLONOPIN) 1 MG tablet    Sig: Take 1.5 tablets (1.5 mg total) by mouth 2 (two) times daily as needed for anxiety.    Dispense:  90 tablet    Refill:  1    Prescription Expired      Follow-up: Return in about 3 months (around 05/24/2023) for a follow-up visit.  Sonda Primes, MD

## 2023-02-25 ENCOUNTER — Ambulatory Visit: Payer: MEDICAID | Admitting: Infectious Disease

## 2023-02-25 ENCOUNTER — Telehealth: Payer: Self-pay

## 2023-02-25 NOTE — Telephone Encounter (Signed)
Called patient regarding missed appointment today with office. Patient states that he forgot about appointment and would like to call back tomorrow to reschedule. Juanita Laster, RMA

## 2023-02-25 NOTE — Progress Notes (Unsigned)
Subjective:    Patient ID: Jeffrey [name], male    DOB: [birth year], 57 y.o.   MRN: [id number]  HPI   Past Medical History:  Diagnosis Date   Acute sinusitis 10/29/2016   8/18   Anxiety    Cerumen impaction 10/29/2016   2018   Long Q-T syndrome 12/19/2014   Pacemaker 1993 [facility] Celexa low dose - no issues w/QT    Muscular fasciculation 03/09/2015   Face, lips, hands - chronic   Opioid dependence (HCC) 12/19/2014   x20 years On Methadone since 1993 - Methadone clinic    Pacemaker    2006 [employer], Identity [address], serial # [id number]. [employer] rep must physically interrogate pacemaker.    PTSD (post-traumatic stress disorder) 12/19/2014   On Klonopin, Celexa  Potential benefits of a long term benzodiazepines  use as well as potential risks  and complications were explained to the patient and were aknowledged.    Skin abscess 03/09/2015   11/16 - small    Substance abuse (HCC)    opioids   Tremor 07/17/2016   ?etiology    Vaccine counseling 01/27/2023    Past Surgical History:  Procedure Laterality Date   HERNIA REPAIR Right    LEAD EXTRACTION N/A 12/29/2022   Procedure: LEAD EXTRACTION;  Surgeon: Lanier Prude, MD;  Location: [facility] INVASIVE CV LAB;  Service: Cardiovascular;  Laterality: N/A;   LEFT HEART CATH AND CORONARY ANGIOGRAPHY N/A 12/25/2022   Procedure: LEFT HEART CATH AND CORONARY ANGIOGRAPHY;  Surgeon: Kathleene Hazel, MD;  Location: [facility] INVASIVE CV LAB;  Service: Cardiovascular;  Laterality: N/A;   LIVER SURGERY     PPM GENERATOR CHANGEOUT N/A 08/14/2017   Procedure: PPM GENERATOR CHANGEOUT;  Surgeon: Duke Salvia, MD;  Location: [facility] INVASIVE CV LAB;  Service: Cardiovascular;  Laterality: N/A;   TEE WITHOUT CARDIOVERSION N/A 12/23/2022   Procedure: TRANSESOPHAGEAL ECHOCARDIOGRAM;  Surgeon: [name], MD;  Location: [facility] INVASIVE CV LAB;  Service: Cardiovascular;  Laterality: N/A;   TEE WITHOUT CARDIOVERSION N/A 12/29/2022   Procedure: TRANSESOPHAGEAL  ECHOCARDIOGRAM;  Surgeon: Lanier Prude, MD;  Location: [facility] INVASIVE CV LAB;  Service: Cardiovascular;  Laterality: N/A;    Family History  Problem Relation Age of Onset   Depression Mother    Diabetes Mother    Arthritis Mother    Arthritis Father       Social History   Socioeconomic History   Marital status: Divorced    Spouse name: Not on file   Number of children: Not on file   Years of education: Not on file   Highest education level: Not on file  Occupational History   Not on file  Tobacco Use   Smoking status: Never   Smokeless tobacco: Current    Types: Chew  Vaping Use   Vaping status: Never Used  Substance and Sexual Activity   Alcohol use: No    Alcohol/week: 0.0 standard drinks of alcohol   Drug use: No    Comment: methadone clinic   Sexual activity: Not Currently  Other Topics Concern   Not on file  Social History Narrative   Not on file   Social Determinants of Health   Financial Resource Strain: Not on file  Food Insecurity: No Food Insecurity (01/05/2023)   Hunger Vital Sign    Worried About Running Out of Food in the Last Year: Never true    Ran Out of Food in the Last Year: Never true  Transportation Needs: No Transportation Needs (01/05/2023)   PRAPARE - Administrator, Civil Service (Medical): No    Lack of Transportation (Non-Medical): No  Physical Activity: Not on file  Stress: Not on file  Social Connections: Not on file    Allergies  Allergen Reactions   Codeine Itching   Penicillins Hives    Has patient had a PCN reaction causing immediate rash, facial/tongue/throat swelling, SOB or lightheadedness with hypotension: unknown Has patient had a PCN reaction causing severe rash involving mucus membranes or skin necrosis: unknown Has patient had a PCN reaction that required hospitalization unknown Has patient had a PCN reaction occurring within the last 10 years: No If all of the above answers are "NO", then may proceed  with Cephalosporin use.      Current Outpatient Medications:    ammonium lactate (AMLACTIN) 12 % cream, Apply 1 Application topically as needed for dry skin. (Patient taking differently: Apply 1 Application topically as needed for dry skin (right foot- affected areas).), Disp: 385 g, Rfl: 0   atenolol (TENORMIN) 50 MG tablet, Take 1 tablet (50 mg total) by mouth daily., Disp: 30 tablet, Rfl: 11   ceFAZolin (ANCEF) IVPB, Inject 2 g into the vein every 8 (eight) hours. Indication:  Cardiac device infection First Dose: No Last Day of Therapy:  02/10/23 Labs - Once weekly:  CBC/D and BMP, Labs - Once weekly: ESR and CRP Method of administration: IV Push Method of administration may be changed at the discretion of home infusion pharmacist based upon assessment of the patient and/or caregiver's ability to self-administer the medication ordered., Disp: 60 Units, Rfl: 0   Cholecalciferol (VITAMIN D3) 2000 UNITS capsule, Take 1 capsule (2,000 Units total) by mouth daily. (Patient not taking: Reported on 12/18/2022), Disp: 100 capsule, Rfl: 3   clonazePAM (KLONOPIN) 1 MG tablet, Take 1.5 tablets (1.5 mg total) by mouth 2 (two) times daily as needed for anxiety., Disp: 90 tablet, Rfl: 1   Continuous Blood Gluc Sensor (FREESTYLE LIBRE 3 SENSOR) MISC, 1 Units by Does not apply route every 14 (fourteen) days. (Patient taking differently: Inject 1 Device into the skin every 14 (fourteen) days.), Disp: 6 each, Rfl: 3   ibuprofen (ADVIL,MOTRIN) 200 MG tablet, Take 400 mg by mouth every 6 (six) hours as needed for mild pain, moderate pain or headache., Disp: , Rfl:    metFORMIN (GLUCOPHAGE) 500 MG tablet, Take 1 tablet (500 mg total) by mouth 2 (two) times daily with a meal. (Patient not taking: Reported on 12/18/2022), Disp: 180 tablet, Rfl: 3   mupirocin ointment (BACTROBAN) 2 %, On leg wound w/dressing change qd or bid (Patient taking differently: Apply 1 Application topically See admin instructions. Apply to affected  areas of the right foot once a week), Disp: 30 g, Rfl: 0   triamcinolone cream (KENALOG) 0.1 %, Apply 1 application topically 3 (three) times daily. (Patient not taking: Reported on 12/18/2022), Disp: 45 g, Rfl: 1   valACYclovir (VALTREX) 1000 MG tablet, Take 1 tablet (1,000 mg total) by mouth 3 (three) times daily. (Patient not taking: Reported on 12/18/2022), Disp: 21 tablet, Rfl: 0   Vitamin D, Ergocalciferol, (DRISDOL) 1.25 MG (50000 UNIT) CAPS capsule, Take 1 capsule (50,000 Units total) by mouth every 7 (seven) days. (Patient taking differently: Take 50,000 Units by mouth every Monday.), Disp: 8 capsule, Rfl: 0 No current facility-administered medications for this visit.  Facility-Administered Medications Ordered in Other Visits:    lactated ringers infusion, , Intravenous, Continuous  PRN, Nils Pyle, CRNA, New Bag at 12/29/22 [phone]   Review of Systems     Objective:   Physical Exam        Assessment & Plan:

## 2023-02-27 ENCOUNTER — Other Ambulatory Visit: Payer: Self-pay | Admitting: Internal Medicine

## 2023-02-27 DIAGNOSIS — E876 Hypokalemia: Secondary | ICD-10-CM | POA: Insufficient documentation

## 2023-02-27 MED ORDER — POTASSIUM CHLORIDE CRYS ER 20 MEQ PO TBCR
20.0000 meq | EXTENDED_RELEASE_TABLET | Freq: Every day | ORAL | 3 refills | Status: DC
Start: 2023-02-27 — End: 2023-07-22

## 2023-02-27 NOTE — Assessment & Plan Note (Signed)
New.  Start potassium prescription

## 2023-05-06 ENCOUNTER — Telehealth: Payer: Self-pay | Admitting: Internal Medicine

## 2023-05-06 NOTE — Telephone Encounter (Signed)
Copied from CRM [id number]. Topic: Medical Record Request - Other >> May 06, 2023 10:33 AM Thomes Dinning wrote: Reason for CRM: Lattie Haw from [facility] is requesting the patient Medication list be included with POC and sent back [facility]  Telephone [phone] ext [phone] fax [phone]

## 2023-05-08 ENCOUNTER — Other Ambulatory Visit: Payer: Self-pay | Admitting: Internal Medicine

## 2023-05-08 NOTE — Telephone Encounter (Signed)
Copied from CRM [id number]. Topic: Clinical - Medication Refill >> May 08, 2023  1:28 PM Clayton Bibles wrote: Most Recent Primary Care Visit:  Provider: Tresa Garter  Department: [facility]  Visit Type: OFFICE VISIT  Date: 02/23/2023  Medication: clonazePAM (KLONOPIN) 1 MG tablet  Has the patient contacted their pharmacy? Yes (Agent: If no, request that the patient contact the pharmacy for the refill. If patient does not wish to contact the pharmacy document the reason why and proceed with request.) (Agent: If yes, when and what did the pharmacy advise?) Pharmacy needs doctor to reorder refills   Is this the correct pharmacy for this prescription? Yes [place] If no, delete pharmacy and type the correct one.  This is the patient's preferred pharmacy:  [facility], [place] - [address] [place] [address] Phone: [phone] Fax: [phone]   Has the prescription been filled recently? No  Is the patient out of the medication? No - He will be out by Monday 05/11/23  Has the patient been seen for an appointment in the last year OR does the patient have an upcoming appointment? Yes  Can we respond through MyChart? No  Agent: Please be advised that Rx refills may take up to 3 business days. We ask that you follow-up with your pharmacy.

## 2023-05-11 MED ORDER — CLONAZEPAM 1 MG PO TABS: 1.5000 mg | ORAL_TABLET | Freq: Two times a day (BID) | ORAL | 0 refills | Status: DC | PRN

## 2023-05-13 NOTE — Telephone Encounter (Signed)
Information has been printed and will be faxed upon return to clinic.

## 2023-05-14 ENCOUNTER — Ambulatory Visit: Payer: MEDICAID | Admitting: Podiatry

## 2023-05-18 ENCOUNTER — Ambulatory Visit: Payer: MEDICAID | Admitting: Podiatry

## 2023-05-19 ENCOUNTER — Ambulatory Visit: Payer: MEDICAID | Admitting: Family Medicine

## 2023-05-26 ENCOUNTER — Ambulatory Visit (INDEPENDENT_AMBULATORY_CARE_PROVIDER_SITE_OTHER): Payer: MEDICAID | Admitting: Internal Medicine

## 2023-05-26 ENCOUNTER — Encounter: Payer: Self-pay | Admitting: Internal Medicine

## 2023-05-26 VITALS — BP 130/70 | HR 87 | Temp 98.2°F | Ht 73.0 in | Wt 232.0 lb

## 2023-05-26 DIAGNOSIS — I4581 Long QT syndrome: Secondary | ICD-10-CM

## 2023-05-26 DIAGNOSIS — E1165 Type 2 diabetes mellitus with hyperglycemia: Secondary | ICD-10-CM | POA: Diagnosis not present

## 2023-05-26 DIAGNOSIS — I10 Essential (primary) hypertension: Secondary | ICD-10-CM | POA: Diagnosis not present

## 2023-05-26 DIAGNOSIS — Z950 Presence of cardiac pacemaker: Secondary | ICD-10-CM | POA: Diagnosis not present

## 2023-05-26 DIAGNOSIS — F4310 Post-traumatic stress disorder, unspecified: Secondary | ICD-10-CM

## 2023-05-26 DIAGNOSIS — F1121 Opioid dependence, in remission: Secondary | ICD-10-CM

## 2023-05-26 MED ORDER — CITALOPRAM HYDROBROMIDE 20 MG PO TABS: 20.0000 mg | ORAL_TABLET | Freq: Every day | ORAL | 1 refills | Status: DC

## 2023-05-26 MED ORDER — CLONAZEPAM 1 MG PO TABS: 1.5000 mg | ORAL_TABLET | Freq: Two times a day (BID) | ORAL | 2 refills | Status: DC | PRN

## 2023-05-26 NOTE — Assessment & Plan Note (Signed)
 Wt Readings from Last 3 Encounters:  05/26/23 232 lb (105.2 kg)  02/23/23 220 lb (99.8 kg)  01/27/23 221 lb (100.2 kg)  Continue with weight loss effort BP Readings from Last 3 Encounters:  05/26/23 130/70  02/23/23 130/80  01/27/23 (!) 159/84

## 2023-05-26 NOTE — Assessment & Plan Note (Addendum)
 Pacemaker was removed due to infection 12/2022

## 2023-05-26 NOTE — Assessment & Plan Note (Signed)
On Methadone since 1993 - Methadone clinic - 4 days a week Drug screens at [facility] monthly 

## 2023-05-26 NOTE — Progress Notes (Signed)
 Subjective:  Patient ID: Jeffrey [name], male    DOB: 24-Sep-1965  Age: 58 y.o. MRN: [id number]  CC: Medical Management of Chronic Issues (3 mnth f/u)   HPI Owais Pruett Ratterman presents for anxiety, opioid dependence, PTSD  Outpatient Medications Prior to Visit  Medication Sig Dispense Refill   ammonium lactate (AMLACTIN) 12 % cream Apply 1 Application topically as needed for dry skin. (Patient taking differently: Apply 1 Application topically as needed for dry skin (right foot- affected areas).) 385 g 0   atenolol (TENORMIN) 50 MG tablet Take 1 tablet (50 mg total) by mouth daily. 30 tablet 11   Continuous Blood Gluc Sensor (FREESTYLE LIBRE 3 SENSOR) MISC 1 Units by Does not apply route every 14 (fourteen) days. (Patient taking differently: Inject 1 Device into the skin every 14 (fourteen) days.) 6 each 3   ibuprofen (ADVIL,MOTRIN) 200 MG tablet Take 400 mg by mouth every 6 (six) hours as needed for mild pain, moderate pain or headache.     mupirocin ointment (BACTROBAN) 2 % On leg wound w/dressing change qd or bid (Patient taking differently: Apply 1 Application topically See admin instructions. Apply to affected areas of the right foot once a week) 30 g 0   potassium chloride SA (KLOR-CON M) 20 MEQ tablet Take 1 tablet (20 mEq total) by mouth daily. 30 tablet 3   Vitamin D, Ergocalciferol, (DRISDOL) 1.25 MG (50000 UNIT) CAPS capsule Take 1 capsule (50,000 Units total) by mouth every 7 (seven) days. (Patient taking differently: Take 50,000 Units by mouth every Monday.) 8 capsule 0   ceFAZolin (ANCEF) IVPB Inject 2 g into the vein every 8 (eight) hours. Indication:  Cardiac device infection First Dose: No Last Day of Therapy:  02/10/23 Labs - Once weekly:  CBC/D and BMP, Labs - Once weekly: ESR and CRP Method of administration: IV Push Method of administration may be changed at the discretion of home infusion pharmacist based upon assessment of the patient and/or caregiver's ability to  self-administer the medication ordered. 60 Units 0   clonazePAM (KLONOPIN) 1 MG tablet Take 1.5 tablets (1.5 mg total) by mouth 2 (two) times daily as needed for anxiety. 90 tablet 0   Cholecalciferol (VITAMIN D3) 2000 UNITS capsule Take 1 capsule (2,000 Units total) by mouth daily. (Patient not taking: Reported on 12/18/2022) 100 capsule 3   metFORMIN (GLUCOPHAGE) 500 MG tablet Take 1 tablet (500 mg total) by mouth 2 (two) times daily with a meal. (Patient not taking: Reported on 12/18/2022) 180 tablet 3   triamcinolone cream (KENALOG) 0.1 % Apply 1 application topically 3 (three) times daily. (Patient not taking: Reported on 12/18/2022) 45 g 1   valACYclovir (VALTREX) 1000 MG tablet Take 1 tablet (1,000 mg total) by mouth 3 (three) times daily. (Patient not taking: Reported on 12/18/2022) 21 tablet 0   lactated ringers infusion      No facility-administered medications prior to visit.    ROS: Review of Systems  Constitutional:  Positive for unexpected weight change. Negative for appetite change, fatigue and fever.  HENT:  Negative for congestion, nosebleeds, sneezing, sore throat and trouble swallowing.   Eyes:  Negative for itching and visual disturbance.  Respiratory:  Negative for cough.   Cardiovascular:  Negative for chest pain, palpitations and leg swelling.  Gastrointestinal:  Negative for abdominal distention, blood in stool, diarrhea and nausea.  Genitourinary:  Negative for frequency and hematuria.  Musculoskeletal:  Negative for back pain, gait problem, joint swelling and neck pain.  Skin:  Negative for rash.  Neurological:  Negative for dizziness, tremors, speech difficulty and weakness.  Psychiatric/Behavioral:  Negative for agitation, dysphoric mood, sleep disturbance and suicidal ideas. The patient is nervous/anxious.     Objective:  BP 130/70   Pulse 87   Temp 98.2 F (36.8 C) (Oral)   Ht 6\' 1"  (1.854 m)   Wt 232 lb (105.2 kg)   SpO2 97%   BMI 30.61 kg/m   BP  Readings from Last 3 Encounters:  05/26/23 130/70  02/23/23 130/80  01/27/23 (!) 159/84    Wt Readings from Last 3 Encounters:  05/26/23 232 lb (105.2 kg)  02/23/23 220 lb (99.8 kg)  01/27/23 221 lb (100.2 kg)    Physical Exam Constitutional:      General: He is not in acute distress.    Appearance: He is well-developed. He is obese.     Comments: NAD  Eyes:     Conjunctiva/sclera: Conjunctivae normal.     Pupils: Pupils are equal, round, and reactive to light.  Neck:     Thyroid: No thyromegaly.     Vascular: No JVD.  Cardiovascular:     Rate and Rhythm: Normal rate and regular rhythm.     Heart sounds: Normal heart sounds. No murmur heard.    No friction rub. No gallop.  Pulmonary:     Effort: Pulmonary effort is normal. No respiratory distress.     Breath sounds: Normal breath sounds. No wheezing or rales.  Chest:     Chest wall: No tenderness.  Abdominal:     General: Bowel sounds are normal. There is no distension.     Palpations: Abdomen is soft. There is no mass.     Tenderness: There is no abdominal tenderness. There is no guarding or rebound.  Musculoskeletal:        General: No tenderness. Normal range of motion.     Cervical back: Normal range of motion.  Lymphadenopathy:     Cervical: No cervical adenopathy.  Skin:    General: Skin is warm and dry.     Findings: No rash.  Neurological:     Mental Status: He is alert and oriented to person, place, and time.     Cranial Nerves: No cranial nerve deficit.     Motor: No abnormal muscle tone.     Coordination: Coordination normal.     Gait: Gait normal.     Deep Tendon Reflexes: Reflexes are normal and symmetric.  Psychiatric:        Behavior: Behavior normal.        Thought Content: Thought content normal.        Judgment: Judgment normal.     Lab Results  Component Value Date   WBC 11.7 (H) 02/23/2023   HGB 12.3 (L) 02/23/2023   HCT 37.3 (L) 02/23/2023   PLT 264.0 02/23/2023   GLUCOSE 116 (H)  02/23/2023   CHOL 162 09/23/2022   TRIG 143.0 09/23/2022   HDL 25.90 (L) 09/23/2022   LDLCALC 107 (H) 09/23/2022   ALT 6 02/23/2023   AST 13 02/23/2023   NA 138 02/23/2023   K 3.3 (L) 02/23/2023   CL 98 02/23/2023   CREATININE 1.04 02/23/2023   BUN 12 02/23/2023   CO2 28 02/23/2023   TSH 3.35 02/23/2023   PSA 0.19 06/16/2008   INR 1.2 12/28/2022   HGBA1C 5.7 02/23/2023   MICROALBUR 0.9 09/23/2022    ECHOCARDIOGRAM COMPLETE Result Date: 12/19/2022    ECHOCARDIOGRAM REPORT  Patient Name:   SAHEED CARRINGTON Date of Exam: 12/19/2022 Medical Rec #:  [id number]         Height:       73.0 in Accession #:    [id number]        Weight:       245.8 lb Date of Birth:  19-Oct-1965         BSA:          2.349 m Patient Age:    57 years          BP:           117/55 mmHg Patient Gender: M                 HR:           80 bpm. Exam Location:  Inpatient Procedure: 2D Echo, Cardiac Doppler and Color Doppler Indications:    Shock  History:        Patient has no prior history of Echocardiogram examinations.                 Pacemaker.  Sonographer:    Darlys Gales Referring Phys: [phone] Clarene Critchley [facility]  Sonographer Comments: No apical window and no subcostal window. Technically difficult study due to patient position and body habitus. IMPRESSIONS  1. Left ventricular ejection fraction, by estimation, is 65 to 70%. The left ventricle has normal function. The left ventricle has no regional wall motion abnormalities. Left ventricular diastolic function could not be evaluated.  2. Right ventricular systolic function is normal. The right ventricular size is normal.  3. The mitral valve is normal in structure. No evidence of mitral valve regurgitation. No evidence of mitral stenosis.  4. The aortic valve is tricuspid. Aortic valve regurgitation is not visualized. No aortic stenosis is present.  5. The inferior vena cava is normal in size with greater than 50% respiratory variability, suggesting right atrial pressure of 3  mmHg. FINDINGS  Left Ventricle: Left ventricular ejection fraction, by estimation, is 65 to 70%. The left ventricle has normal function. The left ventricle has no regional wall motion abnormalities. The left ventricular internal cavity size was normal in size. There is  no left ventricular hypertrophy. Left ventricular diastolic function could not be evaluated. Right Ventricle: The right ventricular size is normal. No increase in right ventricular wall thickness. Right ventricular systolic function is normal. Left Atrium: Left atrial size was normal in size. Right Atrium: Right atrial size was normal in size. Pericardium: There is no evidence of pericardial effusion. Mitral Valve: The mitral valve is normal in structure. No evidence of mitral valve regurgitation. No evidence of mitral valve stenosis. Tricuspid Valve: The tricuspid valve is normal in structure. Tricuspid valve regurgitation is trivial. No evidence of tricuspid stenosis. Aortic Valve: The aortic valve is tricuspid. Aortic valve regurgitation is not visualized. No aortic stenosis is present. Pulmonic Valve: The pulmonic valve was normal in structure. Pulmonic valve regurgitation is not visualized. No evidence of pulmonic stenosis. Aorta: The aortic root is normal in size and structure. Venous: The inferior vena cava is normal in size with greater than 50% respiratory variability, suggesting right atrial pressure of 3 mmHg. IAS/Shunts: No atrial level shunt detected by color flow Doppler.  LEFT VENTRICLE PLAX 2D LVIDd:         3.70 cm LVIDs:         2.10 cm LV PW:         1.20 cm LV IVS:  1.00 cm LVOT diam:     2.00 cm LVOT Area:     3.14 cm   AORTA Ao Root diam: 3.30 cm TRICUSPID VALVE TR Peak grad:   11.3 mmHg TR Vmax:        168.00 cm/s  SHUNTS Systemic Diam: 2.00 cm Chilton Si MD Electronically signed by Chilton Si MD Signature Date/Time: 12/19/2022/11:20:30 AM    Final    DG CHEST PORT 1 VIEW Result Date: 12/18/2022 CLINICAL  DATA:  Central line placement. EXAM: PORTABLE CHEST 1 VIEW COMPARISON:  Radiograph and CT earlier today FINDINGS: Significant patient rotation. New right internal jugular central venous catheter tip overlies the mid SVC. No pneumothorax. Stable heart size and mediastinal contours allowing for rotation. Development of vascular congestion. No focal airspace disease. No pleural fluid or pneumothorax. No acute osseous findings. Left-sided pacemaker remains in place. IMPRESSION: 1. New right internal jugular central venous catheter tip overlies the mid SVC. No pneumothorax. 2. Development of vascular congestion. Electronically Signed   By: Narda Rutherford M.D.   On: 12/18/2022 17:35   CT CHEST ABDOMEN PELVIS W CONTRAST Result Date: 12/18/2022 CLINICAL DATA:  Sepsis. EXAM: CT CHEST, ABDOMEN, AND PELVIS WITH CONTRAST TECHNIQUE: Multidetector CT imaging of the chest, abdomen and pelvis was performed following the standard protocol during bolus administration of intravenous contrast. RADIATION DOSE REDUCTION: This exam was performed according to the departmental dose-optimization program which includes automated exposure control, adjustment of the mA and/or kV according to patient size and/or use of iterative reconstruction technique. CONTRAST:  OMNIPAQUE IOHEXOL 300 MG/ML  SOLN COMPARISON:  None Available. FINDINGS: CT CHEST FINDINGS Cardiovascular: Normal heart size. No pericardial effusion. There is a left chest wall dual lead pacer. Mediastinum/Nodes: No enlarged mediastinal, hilar, or axillary lymph nodes. Thyroid gland, trachea, and esophagus demonstrate no significant findings. Lungs/Pleura: No pleural fluid or pneumothorax. No airspace consolidation identified. Mosaic attenuation pattern is identified within both lower lung zones. Mild bilateral lower lobe bronchial wall thickening. Musculoskeletal: No chest wall mass or suspicious bone lesions identified. Bilateral gynecomastia. CT ABDOMEN PELVIS FINDINGS  Hepatobiliary: No suspicious liver abnormality. Subjective hepatic steatosis. Gallbladder appears normal. There is no bile duct dilatation. Pancreas: Unremarkable. No pancreatic ductal dilatation or surrounding inflammatory changes. Spleen: Spleen measures 15.3 cm in cranial caudal dimension. No focal splenic lesion. Adrenals/Urinary Tract: Normal adrenal glands. Simple cyst arises off the upper pole of the left kidney measuring 1.2 cm. Cyst off the inferior pole of the left kidney measures 1 cm. No follow-up imaging recommended. No signs of nephrolithiasis or hydronephrosis. Urinary bladder is unremarkable. Stomach/Bowel: Stomach is normal. The appendix is visualized and is within normal limits. No pathologic dilatation of the large or small bowel loops. Proximal transverse colon appears decompressed with mucosal enhancement and wall thickening without surrounding inflammatory fat stranding, image 134/8. Intramural fatty deposition is identified involving the ileum and ascending colon. Vascular/Lymphatic: Mild aortic atherosclerosis. Porta hepatic lymph node measures 1.2 cm, image 58/2. Previously 1 cm. Portocaval lymph node measures 1.1 cm, image 56/2. Previously 0.8 cm. No pelvic or inguinal adenopathy. Reproductive: Prostate is unremarkable. Other: No ascites or focal fluid collections. Laxity of the midline ventral abdominal wall identified. No signs of pneumoperitoneum. Musculoskeletal: No acute or suspicious osseous findings. L5-S1 degenerative disc disease. IMPRESSION: 1. No acute findings within the chest, abdomen or pelvis. 2. Mosaic attenuation pattern within both lower lung zones with mild bilateral lower lobe bronchial wall thickening. Findings are nonspecific but may be seen in the setting of small  airways disease. 3. Proximal transverse colon appears decompressed with mucosal enhancement and wall thickening without surrounding inflammatory fat stranding. Findings are nonspecific and may be related to  underdistention. Correlate for any clinical signs or symptoms of colitis. 4. Intramural fatty deposition is identified involving the ileum and ascending colon. This is a nonspecific finding but can be seen in the setting of chronic inflammatory bowel disease. 5. Hepatic steatosis. 6. Splenomegaly. 7. Mildly enlarged porta hepatic and portocaval lymph nodes. These are nonspecific and may be reactive. 8.  Aortic Atherosclerosis (ICD10-I70.0). Electronically Signed   By: Signa Kell M.D.   On: 12/18/2022 07:46   DG Chest Port 1 View Result Date: 12/18/2022 CLINICAL DATA:  58 year old male with suspected meningococcal sepsis. Weakness. EXAM: PORTABLE CHEST 1 VIEW COMPARISON:  Chest radiographs 07/11/2017 and earlier. FINDINGS: Portable AP semi upright view at 0304 hours. Low lung volumes and mildly rotated to the left. Stable chronic left chest dual lead pacemaker. Stable cardiac size and mediastinal contours. Allowing for low lung volumes, Allowing for portable technique the lungs are clear. No pneumothorax, pulmonary edema, pleural effusion, or consolidation identified. No acute osseous abnormality identified. Paucity of bowel gas in the visible abdomen. IMPRESSION: Low lung volumes, otherwise no acute cardiopulmonary abnormality. Electronically Signed   By: Odessa Fleming M.D.   On: 12/18/2022 04:23    Assessment & Plan:   Problem List Items Addressed This Visit     Long Q-T syndrome   Celexa is well tolerated Pacemaker was removed due to infection 12/2022      PTSD (post-traumatic stress disorder)   Worse. Increase clonazepam to 1.5 mg twice daily as needed.  Potential benefits of a long term benzodiazepines  use as well as potential risks  and complications were explained to the patient and were aknowledged.      Relevant Medications   citalopram (CELEXA) 20 MG tablet   Opioid dependence (HCC)   On Methadone since 1993 - Methadone clinic - 4 days a week Drug [facility]  monthly      Cardiac pacemaker in situ - Primary   Pacemaker was removed due to infection 12/2022      HTN (hypertension)   Wt Readings from Last 3 Encounters:  05/26/23 232 lb (105.2 kg)  02/23/23 220 lb (99.8 kg)  01/27/23 221 lb (100.2 kg)  Continue with weight loss effort BP Readings from Last 3 Encounters:  05/26/23 130/70  02/23/23 130/80  01/27/23 (!) 159/84         Type 2 diabetes mellitus with hyperglycemia (HCC)   Not taking Metformin Obtain labs including hemoglobin A1c          Meds ordered this encounter  Medications   citalopram (CELEXA) 20 MG tablet    Sig: Take 1 tablet (20 mg total) by mouth daily.    Dispense:  90 tablet    Refill:  1   clonazePAM (KLONOPIN) 1 MG tablet    Sig: Take 1.5 tablets (1.5 mg total) by mouth 2 (two) times daily as needed for anxiety.    Dispense:  90 tablet    Refill:  2    Prescription Expired      Follow-up: Return in about 3 months (around 08/26/2023) for f/u with PCP.  Sonda Primes, MD

## 2023-05-26 NOTE — Assessment & Plan Note (Signed)
 Not taking Metformin Obtain labs including hemoglobin A1c

## 2023-05-26 NOTE — Assessment & Plan Note (Signed)
 Celexa is well tolerated Pacemaker was removed due to infection 12/2022

## 2023-05-26 NOTE — Assessment & Plan Note (Signed)
 Worse. Increase clonazepam to 1.5 mg twice daily as needed.  Potential benefits of a long term benzodiazepines  use as well as potential risks  and complications were explained to the patient and were aknowledged.

## 2023-06-04 ENCOUNTER — Other Ambulatory Visit: Payer: Self-pay | Admitting: Internal Medicine

## 2023-07-08 ENCOUNTER — Ambulatory Visit: Payer: MEDICAID | Admitting: Family Medicine

## 2023-07-22 ENCOUNTER — Other Ambulatory Visit: Payer: Self-pay | Admitting: Internal Medicine

## 2023-07-22 ENCOUNTER — Telehealth: Payer: Self-pay | Admitting: Internal Medicine

## 2023-07-22 NOTE — Telephone Encounter (Signed)
 Okay.  Thanks.

## 2023-07-22 NOTE — Telephone Encounter (Signed)
 Copied from CRM [id number]. Topic: Clinical - Medication Refill >> Jul 22, 2023  8:51 AM Deaijah H wrote: Most Recent Primary Care Visit:  Provider: Genia Kettering  Department: [facility]  Visit Type: OFFICE VISIT  Date: 05/26/2023  Medication: potassium chloride  SA (KLOR-CON  M) 20 MEQ tablet  Has the patient contacted their pharmacy? Yes (Agent: If no, request that the patient contact the pharmacy for the refill. If patient does not wish to contact the pharmacy document the reason why and proceed with request.) (Agent: If yes, when and what did the pharmacy advise?) - They sent the request twice but no answer/not recieved  Is this the correct pharmacy for this prescription? Yes If no, delete pharmacy and type the correct one.  This is the patient's preferred pharmacy:  [facility], [place] - [address] [place] [address] Phone: [phone] Fax: [phone]   Has the prescription been filled recently? Yes  Is the patient out of the medication? Yes  Has the patient been seen for an appointment in the last year OR does the patient have an upcoming appointment? Yes  Can we respond through MyChart? Yes  Agent: Please be advised that Rx refills may take up to 3 business days. We ask that you follow-up with your pharmacy.

## 2023-07-22 NOTE — Telephone Encounter (Signed)
 Copied from CRM [id number]. Topic: Clinical - Medication Question >> Jul 22, 2023  8:56 AM Deaijah H wrote: Reason for CRM: [facility] called in stating they sent a request in for potassium chloride  SA (KLOR-CON  M) 20 MEQ tablet on 07/08/23 and again today. Did send refill request, but would like to know if it can be approved & sent to pharmacy today due to picking up other prescriptions today.

## 2023-07-24 MED ORDER — POTASSIUM CHLORIDE CRYS ER 20 MEQ PO TBCR: 20.0000 meq | EXTENDED_RELEASE_TABLET | Freq: Every day | ORAL | 3 refills | Status: DC

## 2023-08-26 ENCOUNTER — Ambulatory Visit (INDEPENDENT_AMBULATORY_CARE_PROVIDER_SITE_OTHER): Payer: MEDICAID | Admitting: Internal Medicine

## 2023-08-26 ENCOUNTER — Encounter: Payer: Self-pay | Admitting: Internal Medicine

## 2023-08-26 VITALS — BP 142/78 | HR 70 | Temp 97.8°F | Ht 73.0 in | Wt 232.0 lb

## 2023-08-26 DIAGNOSIS — F1121 Opioid dependence, in remission: Secondary | ICD-10-CM

## 2023-08-26 DIAGNOSIS — I4581 Long QT syndrome: Secondary | ICD-10-CM

## 2023-08-26 DIAGNOSIS — T827XXD Infection and inflammatory reaction due to other cardiac and vascular devices, implants and grafts, subsequent encounter: Secondary | ICD-10-CM

## 2023-08-26 DIAGNOSIS — Z7984 Long term (current) use of oral hypoglycemic drugs: Secondary | ICD-10-CM

## 2023-08-26 DIAGNOSIS — E1165 Type 2 diabetes mellitus with hyperglycemia: Secondary | ICD-10-CM

## 2023-08-26 DIAGNOSIS — F4310 Post-traumatic stress disorder, unspecified: Secondary | ICD-10-CM

## 2023-08-26 LAB — COMPREHENSIVE METABOLIC PANEL WITH GFR
ALT: 13 U/L (ref 0–53)
AST: 20 U/L (ref 0–37)
Albumin: 3.9 g/dL (ref 3.5–5.2)
Alkaline Phosphatase: 84 U/L (ref 39–117)
BUN: 12 mg/dL (ref 6–23)
CO2: 29 meq/L (ref 19–32)
Calcium: 9.1 mg/dL (ref 8.4–10.5)
Chloride: 99 meq/L (ref 96–112)
Creatinine, Ser: 1.06 mg/dL (ref 0.40–1.50)
GFR: 76.4 mL/min (ref >60.00)
Glucose, Bld: 244 mg/dL — ABNORMAL HIGH (ref 70–99)
Potassium: 4 meq/L (ref 3.5–5.1)
Sodium: 135 meq/L (ref 135–145)
Total Bilirubin: 0.5 mg/dL (ref 0.2–1.2)
Total Protein: 8 g/dL (ref 6.0–8.3)

## 2023-08-26 LAB — HEMOGLOBIN A1C: Hgb A1c MFr Bld: 8.4 % — ABNORMAL HIGH (ref 4.6–6.5)

## 2023-08-26 MED ORDER — CLONAZEPAM 1 MG PO TABS: 1.5000 mg | ORAL_TABLET | Freq: Two times a day (BID) | ORAL | 2 refills | Status: DC | PRN

## 2023-08-26 NOTE — Progress Notes (Signed)
 Subjective:  Patient ID: Jeffrey [name], male    DOB: 1965/08/01  Age: 58 y.o. MRN: [id number]  CC: Medical Management of Chronic Issues (3 month f/u)   HPI Jeffrey [name] presents for anxiety, depression, h/o pacemaker infection, DM  Outpatient Medications Prior to Visit  Medication Sig Dispense Refill   ammonium lactate  (AMLACTIN) 12 % cream Apply 1 Application topically as needed for dry skin. (Patient taking differently: Apply 1 Application topically as needed for dry skin (right foot- affected areas).) 385 g 0   atenolol  (TENORMIN ) 50 MG tablet TAKE 1 TABLET (50 MG TOTAL) BY MOUTH DAILY. 30 tablet 11   citalopram  (CELEXA ) 20 MG tablet Take 1 tablet (20 mg total) by mouth daily. 90 tablet 1   Continuous Blood Gluc Sensor (FREESTYLE LIBRE 3 SENSOR) MISC 1 Units by Does not apply route every 14 (fourteen) days. (Patient taking differently: Inject 1 Device into the skin every 14 (fourteen) days.) 6 each 3   ibuprofen (ADVIL,MOTRIN) 200 MG tablet Take 400 mg by mouth every 6 (six) hours as needed for mild pain, moderate pain or headache.     metFORMIN  (GLUCOPHAGE ) 500 MG tablet Take 1 tablet (500 mg total) by mouth 2 (two) times daily with a meal. 180 tablet 3   mupirocin  ointment (BACTROBAN ) 2 % On leg wound w/dressing change qd or bid (Patient taking differently: Apply 1 Application topically See admin instructions. Apply to affected areas of the right foot once a week) 30 g 0   potassium chloride  SA (KLOR-CON  M) 20 MEQ tablet Take 1 tablet (20 mEq total) by mouth daily. 30 tablet 3   triamcinolone  cream (KENALOG ) 0.1 % Apply 1 application topically 3 (three) times daily. 45 g 1   valACYclovir  (VALTREX ) 1000 MG tablet Take 1 tablet (1,000 mg total) by mouth 3 (three) times daily. 21 tablet 0   Vitamin D , Ergocalciferol , (DRISDOL ) 1.25 MG (50000 UNIT) CAPS capsule Take 1 capsule (50,000 Units total) by mouth every 7 (seven) days. (Patient taking differently: Take 50,000 Units by mouth  every Monday.) 8 capsule 0   clonazePAM  (KLONOPIN ) 1 MG tablet Take 1.5 tablets (1.5 mg total) by mouth 2 (two) times daily as needed for anxiety. 90 tablet 2   Cholecalciferol (VITAMIN D3) 2000 UNITS capsule Take 1 capsule (2,000 Units total) by mouth daily. (Patient not taking: Reported on 08/26/2023) 100 capsule 3   No facility-administered medications prior to visit.    ROS: Review of Systems  Constitutional:  Negative for appetite change, fatigue and unexpected weight change.  HENT:  Negative for congestion, nosebleeds, sneezing, sore throat and trouble swallowing.   Eyes:  Negative for itching and visual disturbance.  Respiratory:  Negative for cough.   Cardiovascular:  Negative for chest pain, palpitations and leg swelling.  Gastrointestinal:  Negative for abdominal distention, blood in stool, diarrhea and nausea.  Genitourinary:  Negative for frequency and hematuria.  Musculoskeletal:  Negative for back pain, gait problem, joint swelling and neck pain.  Skin:  Negative for rash.  Neurological:  Negative for dizziness, tremors, speech difficulty and weakness.  Psychiatric/Behavioral:  Negative for agitation, dysphoric mood, sleep disturbance and suicidal ideas. The patient is nervous/anxious.     Objective:  BP (!) 142/78 (BP Location: Left Arm, Patient Position: Sitting)   Pulse 70   Temp 97.8 F (36.6 C) (Temporal)   Ht 6\' 1"  (1.854 m)   Wt 232 lb (105.2 kg)   SpO2 97%   BMI 30.61 kg/m  BP Readings from Last 3 Encounters:  08/26/23 (!) 142/78  05/26/23 130/70  02/23/23 130/80    Wt Readings from Last 3 Encounters:  08/26/23 232 lb (105.2 kg)  05/26/23 232 lb (105.2 kg)  02/23/23 220 lb (99.8 kg)    Physical Exam Constitutional:      General: He is not in acute distress.    Appearance: He is well-developed. He is obese.     Comments: NAD  Eyes:     Conjunctiva/sclera: Conjunctivae normal.     Pupils: Pupils are equal, round, and reactive to light.  Neck:      Thyroid: No thyromegaly.     Vascular: No JVD.  Cardiovascular:     Rate and Rhythm: Normal rate and regular rhythm.     Heart sounds: Normal heart sounds. No murmur heard.    No friction rub. No gallop.  Pulmonary:     Effort: Pulmonary effort is normal. No respiratory distress.     Breath sounds: Normal breath sounds. No wheezing or rales.  Chest:     Chest wall: No tenderness.  Abdominal:     General: Bowel sounds are normal. There is no distension.     Palpations: Abdomen is soft. There is no mass.     Tenderness: There is no abdominal tenderness. There is no guarding or rebound.  Musculoskeletal:        General: No tenderness. Normal range of motion.     Cervical back: Normal range of motion.     Right lower leg: No edema.     Left lower leg: No edema.  Lymphadenopathy:     Cervical: No cervical adenopathy.  Skin:    General: Skin is warm and dry.     Findings: No rash.  Neurological:     Mental Status: He is alert and oriented to person, place, and time.     Cranial Nerves: No cranial nerve deficit.     Motor: No abnormal muscle tone.     Coordination: Coordination normal.     Gait: Gait normal.     Deep Tendon Reflexes: Reflexes are normal and symmetric.  Psychiatric:        Behavior: Behavior normal.        Thought Content: Thought content normal.        Judgment: Judgment normal.     Lab Results  Component Value Date   WBC 11.7 (H) 02/23/2023   HGB 12.3 (L) 02/23/2023   HCT 37.3 (L) 02/23/2023   PLT 264.0 02/23/2023   GLUCOSE 116 (H) 02/23/2023   CHOL 162 09/23/2022   TRIG 143.0 09/23/2022   HDL 25.90 (L) 09/23/2022   LDLCALC 107 (H) 09/23/2022   ALT 6 02/23/2023   AST 13 02/23/2023   NA 138 02/23/2023   K 3.3 (L) 02/23/2023   CL 98 02/23/2023   CREATININE 1.04 02/23/2023   BUN 12 02/23/2023   CO2 28 02/23/2023   TSH 3.35 02/23/2023   PSA 0.19 06/16/2008   INR 1.2 12/28/2022   HGBA1C 5.7 02/23/2023   MICROALBUR 0.9 09/23/2022     ECHOCARDIOGRAM COMPLETE Result Date: 12/19/2022    ECHOCARDIOGRAM REPORT   Patient Name:   Jeffrey [name] Date of Exam: 12/19/2022 Medical Rec #:  [id number]         Height:       73.0 in Accession #:    [id number]        Weight:       245.8 lb Date of Birth:  09-10-1965         BSA:          2.349 m Patient Age:    57 years          BP:           117/55 mmHg Patient Gender: M                 HR:           80 bpm. Exam Location:  Inpatient Procedure: 2D Echo, Cardiac Doppler and Color Doppler Indications:    Shock  History:        Patient has no prior history of Echocardiogram examinations.                 Pacemaker.  Sonographer:    Astrid Blamer Referring Phys: [phone] [place] Phillips [facility]  Sonographer Comments: No apical window and no subcostal window. Technically difficult study due to patient position and body habitus. IMPRESSIONS  1. Left ventricular ejection fraction, by estimation, is 65 to 70%. The left ventricle has normal function. The left ventricle has no regional wall motion abnormalities. Left ventricular diastolic function could not be evaluated.  2. Right ventricular systolic function is normal. The right ventricular size is normal.  3. The mitral valve is normal in structure. No evidence of mitral valve regurgitation. No evidence of mitral stenosis.  4. The aortic valve is tricuspid. Aortic valve regurgitation is not visualized. No aortic stenosis is present.  5. The inferior vena cava is normal in size with greater than 50% respiratory variability, suggesting right atrial pressure of 3 mmHg. FINDINGS  Left Ventricle: Left ventricular ejection fraction, by estimation, is 65 to 70%. The left ventricle has normal function. The left ventricle has no regional wall motion abnormalities. The left ventricular internal cavity size was normal in size. There is  no left ventricular hypertrophy. Left ventricular diastolic function could not be evaluated. Right Ventricle: The right ventricular size is normal. No  increase in right ventricular wall thickness. Right ventricular systolic function is normal. Left Atrium: Left atrial size was normal in size. Right Atrium: Right atrial size was normal in size. Pericardium: There is no evidence of pericardial effusion. Mitral Valve: The mitral valve is normal in structure. No evidence of mitral valve regurgitation. No evidence of mitral valve stenosis. Tricuspid Valve: The tricuspid valve is normal in structure. Tricuspid valve regurgitation is trivial. No evidence of tricuspid stenosis. Aortic Valve: The aortic valve is tricuspid. Aortic valve regurgitation is not visualized. No aortic stenosis is present. Pulmonic Valve: The pulmonic valve was normal in structure. Pulmonic valve regurgitation is not visualized. No evidence of pulmonic stenosis. Aorta: The aortic root is normal in size and structure. Venous: The inferior vena cava is normal in size with greater than 50% respiratory variability, suggesting right atrial pressure of 3 mmHg. IAS/Shunts: No atrial level shunt detected by color flow Doppler.  LEFT VENTRICLE PLAX 2D LVIDd:         3.70 cm LVIDs:         2.10 cm LV PW:         1.20 cm LV IVS:        1.00 cm LVOT diam:     2.00 cm LVOT Area:     3.14 cm   AORTA Ao Root diam: 3.30 cm TRICUSPID VALVE TR Peak grad:   11.3 mmHg TR Vmax:        168.00 cm/s  SHUNTS Systemic Diam: 2.00 cm Tiffany  Theodis Fiscal MD Electronically signed by Maudine Sos MD Signature Date/Time: 12/19/2022/11:20:30 AM    Final    DG CHEST PORT 1 VIEW Result Date: 12/18/2022 CLINICAL DATA:  Central line placement. EXAM: PORTABLE CHEST 1 VIEW COMPARISON:  Radiograph and CT earlier today FINDINGS: Significant patient rotation. New right internal jugular central venous catheter tip overlies the mid SVC. No pneumothorax. Stable heart size and mediastinal contours allowing for rotation. Development of vascular congestion. No focal airspace disease. No pleural fluid or pneumothorax. No acute osseous  findings. Left-sided pacemaker remains in place. IMPRESSION: 1. New right internal jugular central venous catheter tip overlies the mid SVC. No pneumothorax. 2. Development of vascular congestion. Electronically Signed   By: Chadwick Colonel M.D.   On: 12/18/2022 17:35   CT CHEST ABDOMEN PELVIS W CONTRAST Result Date: 12/18/2022 CLINICAL DATA:  Sepsis. EXAM: CT CHEST, ABDOMEN, AND PELVIS WITH CONTRAST TECHNIQUE: Multidetector CT imaging of the chest, abdomen and pelvis was performed following the standard protocol during bolus administration of intravenous contrast. RADIATION DOSE REDUCTION: This exam was performed according to the departmental dose-optimization program which includes automated exposure control, adjustment of the mA and/or kV according to patient size and/or use of iterative reconstruction technique. CONTRAST:  OMNIPAQUE  IOHEXOL  300 MG/ML  SOLN COMPARISON:  None Available. FINDINGS: CT CHEST FINDINGS Cardiovascular: Normal heart size. No pericardial effusion. There is a left chest wall dual lead pacer. Mediastinum/Nodes: No enlarged mediastinal, hilar, or axillary lymph nodes. Thyroid gland, trachea, and esophagus demonstrate no significant findings. Lungs/Pleura: No pleural fluid or pneumothorax. No airspace consolidation identified. Mosaic attenuation pattern is identified within both lower lung zones. Mild bilateral lower lobe bronchial wall thickening. Musculoskeletal: No chest wall mass or suspicious bone lesions identified. Bilateral gynecomastia. CT ABDOMEN PELVIS FINDINGS Hepatobiliary: No suspicious liver abnormality. Subjective hepatic steatosis. Gallbladder appears normal. There is no bile duct dilatation. Pancreas: Unremarkable. No pancreatic ductal dilatation or surrounding inflammatory changes. Spleen: Spleen measures 15.3 cm in cranial caudal dimension. No focal splenic lesion. Adrenals/Urinary Tract: Normal adrenal glands. Simple cyst arises off the upper pole of the left  kidney measuring 1.2 cm. Cyst off the inferior pole of the left kidney measures 1 cm. No follow-up imaging recommended. No signs of nephrolithiasis or hydronephrosis. Urinary bladder is unremarkable. Stomach/Bowel: Stomach is normal. The appendix is visualized and is within normal limits. No pathologic dilatation of the large or small bowel loops. Proximal transverse colon appears decompressed with mucosal enhancement and wall thickening without surrounding inflammatory fat stranding, image 134/8. Intramural fatty deposition is identified involving the ileum and ascending colon. Vascular/Lymphatic: Mild aortic atherosclerosis. Porta hepatic lymph node measures 1.2 cm, image 58/2. Previously 1 cm. Portocaval lymph node measures 1.1 cm, image 56/2. Previously 0.8 cm. No pelvic or inguinal adenopathy. Reproductive: Prostate is unremarkable. Other: No ascites or focal fluid collections. Laxity of the midline ventral abdominal wall identified. No signs of pneumoperitoneum. Musculoskeletal: No acute or suspicious osseous findings. L5-S1 degenerative disc disease. IMPRESSION: 1. No acute findings within the chest, abdomen or pelvis. 2. Mosaic attenuation pattern within both lower lung zones with mild bilateral lower lobe bronchial wall thickening. Findings are nonspecific but may be seen in the setting of small airways disease. 3. Proximal transverse colon appears decompressed with mucosal enhancement and wall thickening without surrounding inflammatory fat stranding. Findings are nonspecific and may be related to underdistention. Correlate for any clinical signs or symptoms of colitis. 4. Intramural fatty deposition is identified involving the ileum and ascending colon. This is a nonspecific  finding but can be seen in the setting of chronic inflammatory bowel disease. 5. Hepatic steatosis. 6. Splenomegaly. 7. Mildly enlarged porta hepatic and portocaval lymph nodes. These are nonspecific and may be reactive. 8.  Aortic  Atherosclerosis (ICD10-I70.0). Electronically Signed   By: Kimberley Penman M.D.   On: 12/18/2022 07:46   DG Chest Port 1 View Result Date: 12/18/2022 CLINICAL DATA:  58 year old male with suspected meningococcal sepsis. Weakness. EXAM: PORTABLE CHEST 1 VIEW COMPARISON:  Chest radiographs 07/11/2017 and earlier. FINDINGS: Portable AP semi upright view at 0304 hours. Low lung volumes and mildly rotated to the left. Stable chronic left chest dual lead pacemaker. Stable cardiac size and mediastinal contours. Allowing for low lung volumes, Allowing for portable technique the lungs are clear. No pneumothorax, pulmonary edema, pleural effusion, or consolidation identified. No acute osseous abnormality identified. Paucity of bowel gas in the visible abdomen. IMPRESSION: Low lung volumes, otherwise no acute cardiopulmonary abnormality. Electronically Signed   By: Marlise Simpers M.D.   On: 12/18/2022 04:23    Assessment & Plan:   Problem List Items Addressed This Visit     Long Q-T syndrome   Celexa  is well tolerated Pacemaker was removed due to infection 12/2022      PTSD (post-traumatic stress disorder) - Primary   Worse. Increase clonazepam  to 1.5 mg twice daily as needed.  Potential benefits of a long term benzodiazepines  use as well as potential risks  and complications were explained to the patient and were aknowledged.      Opioid dependence (HCC)   On Methadone  since 1993 - [facility] - 4 days a week, meds for 7 days Drug screens at [facility] monthly      Type 2 diabetes mellitus with hyperglycemia (HCC)   Not taking Metformin  Obtain labs including hemoglobin A1c       Relevant Orders   Comprehensive metabolic panel with GFR   Hemoglobin A1c   Pacemaker infection (HCC)   Pacemaker and wires were removed due  to Staph infection/sepsis 11/2022. Doing well.         Meds ordered this encounter  Medications   clonazePAM  (KLONOPIN ) 1 MG tablet    Sig: Take 1.5 tablets (1.5  mg total) by mouth 2 (two) times daily as needed for anxiety.    Dispense:  90 tablet    Refill:  2    Prescription Expired      Follow-up: No follow-ups on file.  Anitra Barn, MD

## 2023-08-26 NOTE — Assessment & Plan Note (Signed)
 Celexa is well tolerated Pacemaker was removed due to infection 12/2022

## 2023-08-26 NOTE — Assessment & Plan Note (Signed)
 Pacemaker and wires were removed due  to Staph infection/sepsis 11/2022. Doing well.

## 2023-08-26 NOTE — Assessment & Plan Note (Signed)
 Not taking Metformin Obtain labs including hemoglobin A1c

## 2023-08-26 NOTE — Assessment & Plan Note (Signed)
 On Methadone  since 1993 - [facility] - 4 days a week, meds for 7 days Drug screens at [facility] monthly

## 2023-08-26 NOTE — Assessment & Plan Note (Signed)
 Worse. Increase clonazepam to 1.5 mg twice daily as needed.  Potential benefits of a long term benzodiazepines  use as well as potential risks  and complications were explained to the patient and were aknowledged.

## 2023-08-30 ENCOUNTER — Ambulatory Visit: Payer: Self-pay | Admitting: Internal Medicine

## 2023-09-03 ENCOUNTER — Other Ambulatory Visit: Payer: Self-pay | Admitting: Internal Medicine

## 2023-09-03 NOTE — Telephone Encounter (Signed)
 Copied from CRM [id number]. Topic: Clinical - Medication Refill >> Sep 03, 2023  8:12 AM Jenice Mitts wrote: Medication: citalopram  (CELEXA ) 20 MG tablet   Has the patient contacted their pharmacy? Yes (Agent: If no, request that the patient contact the pharmacy for the refill. If patient does not wish to contact the pharmacy document the reason why and proceed with request.) (Agent: If yes, when and what did the pharmacy advise?)  This is the patient's preferred pharmacy:  [facility], [place] - [address] [place] [address] Phone: [phone] Fax: [phone]  Is this the correct pharmacy for this prescription? Yes If no, delete pharmacy and type the correct one.   Has the prescription been filled recently? No  Is the patient out of the medication? No  Has the patient been seen for an appointment in the last year OR does the patient have an upcoming appointment? Yes  Can we respond through MyChart? Yes  Agent: Please be advised that Rx refills may take up to 3 business days. We ask that you follow-up with your pharmacy.

## 2023-09-04 MED ORDER — CITALOPRAM HYDROBROMIDE 20 MG PO TABS: 20.0000 mg | ORAL_TABLET | Freq: Every day | ORAL | 1 refills | Status: AC

## 2023-10-08 ENCOUNTER — Ambulatory Visit: Payer: MEDICAID | Admitting: Podiatry

## 2023-10-19 ENCOUNTER — Ambulatory Visit: Payer: MEDICAID | Admitting: Podiatry

## 2023-11-30 ENCOUNTER — Encounter: Payer: Self-pay | Admitting: Internal Medicine

## 2023-11-30 ENCOUNTER — Ambulatory Visit (INDEPENDENT_AMBULATORY_CARE_PROVIDER_SITE_OTHER): Payer: MEDICAID | Admitting: Internal Medicine

## 2023-11-30 VITALS — BP 132/84 | HR 83 | Temp 97.8°F | Ht 73.0 in | Wt 224.8 lb

## 2023-11-30 DIAGNOSIS — E559 Vitamin D deficiency, unspecified: Secondary | ICD-10-CM

## 2023-11-30 DIAGNOSIS — F4310 Post-traumatic stress disorder, unspecified: Secondary | ICD-10-CM

## 2023-11-30 DIAGNOSIS — I10 Essential (primary) hypertension: Secondary | ICD-10-CM

## 2023-11-30 DIAGNOSIS — E1165 Type 2 diabetes mellitus with hyperglycemia: Secondary | ICD-10-CM

## 2023-11-30 DIAGNOSIS — Z1212 Encounter for screening for malignant neoplasm of rectum: Secondary | ICD-10-CM

## 2023-11-30 DIAGNOSIS — E876 Hypokalemia: Secondary | ICD-10-CM | POA: Diagnosis not present

## 2023-11-30 LAB — HEMOGLOBIN A1C: Hgb A1c MFr Bld: 13.3 % — ABNORMAL HIGH (ref 4.6–6.5)

## 2023-11-30 LAB — COMPREHENSIVE METABOLIC PANEL WITH GFR
ALT: 13 U/L (ref 0–53)
AST: 19 U/L (ref 0–37)
Albumin: 3.7 g/dL (ref 3.5–5.2)
Alkaline Phosphatase: 82 U/L (ref 39–117)
BUN: 8 mg/dL (ref 6–23)
CO2: 30 meq/L (ref 19–32)
Calcium: 9.2 mg/dL (ref 8.4–10.5)
Chloride: 93 meq/L — ABNORMAL LOW (ref 96–112)
Creatinine, Ser: 1.11 mg/dL (ref 0.40–1.50)
GFR: 72.15 mL/min (ref >60.00)
Glucose, Bld: 340 mg/dL — ABNORMAL HIGH (ref 70–99)
Potassium: 3.9 meq/L (ref 3.5–5.1)
Sodium: 132 meq/L — ABNORMAL LOW (ref 135–145)
Total Bilirubin: 0.8 mg/dL (ref 0.2–1.2)
Total Protein: 7.8 g/dL (ref 6.0–8.3)

## 2023-11-30 MED ORDER — CLONAZEPAM 1 MG PO TABS: 1.5000 mg | ORAL_TABLET | Freq: Two times a day (BID) | ORAL | 2 refills | Status: DC | PRN

## 2023-11-30 NOTE — Assessment & Plan Note (Signed)
Start vitamin D prescription followed by daily 2000 units daily

## 2023-11-30 NOTE — Assessment & Plan Note (Signed)
 On clonazepam  to 1.5 mg twice daily as needed.  Potential benefits of a long term benzodiazepines  use as well as potential risks  and complications were explained to the patient and were aknowledged.

## 2023-11-30 NOTE — Assessment & Plan Note (Signed)
 On Tenormin 

## 2023-11-30 NOTE — Progress Notes (Signed)
 Subjective:  Patient ID: Jeffrey [name], male    DOB: 1966/03/21  Age: 58 y.o. MRN: [id number]  CC: Follow-up (Clonzapem refill. Patient states a little trouble with appetite/eating. )   HPI Jeffrey KATHEE Revuelta presents for anxiety, DM2, HTN  Outpatient Medications Prior to Visit  Medication Sig Dispense Refill   atenolol  (TENORMIN ) 50 MG tablet TAKE 1 TABLET (50 MG TOTAL) BY MOUTH DAILY. 30 tablet 11   Cholecalciferol (VITAMIN D3) 2000 UNITS capsule Take 1 capsule (2,000 Units total) by mouth daily. 100 capsule 3   citalopram  (CELEXA ) 20 MG tablet Take 1 tablet (20 mg total) by mouth daily. 90 tablet 1   ibuprofen (ADVIL,MOTRIN) 200 MG tablet Take 400 mg by mouth every 6 (six) hours as needed for mild pain, moderate pain or headache.     potassium chloride  SA (KLOR-CON  M) 20 MEQ tablet Take 1 tablet (20 mEq total) by mouth daily. 30 tablet 3   Vitamin D , Ergocalciferol , (DRISDOL ) 1.25 MG (50000 UNIT) CAPS capsule Take 1 capsule (50,000 Units total) by mouth every 7 (seven) days. 8 capsule 0   clonazePAM  (KLONOPIN ) 1 MG tablet Take 1.5 tablets (1.5 mg total) by mouth 2 (two) times daily as needed for anxiety. 90 tablet 2   ammonium lactate  (AMLACTIN) 12 % cream Apply 1 Application topically as needed for dry skin. (Patient taking differently: Apply 1 Application topically as needed for dry skin (right foot- affected areas).) 385 g 0   Continuous Blood Gluc Sensor (FREESTYLE LIBRE 3 SENSOR) MISC 1 Units by Does not apply route every 14 (fourteen) days. (Patient not taking: Reported on 11/30/2023) 6 each 3   metFORMIN  (GLUCOPHAGE ) 500 MG tablet Take 1 tablet (500 mg total) by mouth 2 (two) times daily with a meal. (Patient not taking: Reported on 11/30/2023) 180 tablet 3   mupirocin  ointment (BACTROBAN ) 2 % On leg wound w/dressing change qd or bid (Patient not taking: Reported on 11/30/2023) 30 g 0   triamcinolone  cream (KENALOG ) 0.1 % Apply 1 application topically 3 (three) times daily. (Patient  not taking: Reported on 11/30/2023) 45 g 1   valACYclovir  (VALTREX ) 1000 MG tablet Take 1 tablet (1,000 mg total) by mouth 3 (three) times daily. (Patient not taking: Reported on 11/30/2023) 21 tablet 0   No facility-administered medications prior to visit.    ROS: Review of Systems  Constitutional:  Negative for appetite change, fatigue and unexpected weight change.  HENT:  Negative for congestion, nosebleeds, sneezing, sore throat and trouble swallowing.   Eyes:  Negative for itching and visual disturbance.  Respiratory:  Negative for cough.   Cardiovascular:  Negative for chest pain, palpitations and leg swelling.  Gastrointestinal:  Negative for abdominal distention, blood in stool, diarrhea and nausea.  Genitourinary:  Negative for frequency and hematuria.  Musculoskeletal:  Negative for back pain, gait problem, joint swelling and neck pain.  Skin:  Negative for rash.  Neurological:  Negative for dizziness, tremors, speech difficulty and weakness.  Psychiatric/Behavioral:  Positive for dysphoric mood. Negative for agitation, sleep disturbance and suicidal ideas. The patient is nervous/anxious.     Objective:  BP 132/84   Pulse 83   Temp 97.8 F (36.6 C) (Oral)   Ht 6' 1 (1.854 m)   Wt 224 lb 12.8 oz (102 kg)   SpO2 96%   BMI 29.66 kg/m   BP Readings from Last 3 Encounters:  11/30/23 132/84  08/26/23 (!) 142/78  05/26/23 130/70    Wt Readings from Last 3  Encounters:  11/30/23 224 lb 12.8 oz (102 kg)  08/26/23 232 lb (105.2 kg)  05/26/23 232 lb (105.2 kg)    Physical Exam Constitutional:      General: He is not in acute distress.    Appearance: He is well-developed. He is obese.     Comments: NAD  Eyes:     Conjunctiva/sclera: Conjunctivae normal.     Pupils: Pupils are equal, round, and reactive to light.  Neck:     Thyroid: No thyromegaly.     Vascular: No JVD.  Cardiovascular:     Rate and Rhythm: Normal rate and regular rhythm.     Heart sounds: Normal  heart sounds. No murmur heard.    No friction rub. No gallop.  Pulmonary:     Effort: Pulmonary effort is normal. No respiratory distress.     Breath sounds: Normal breath sounds. No wheezing or rales.  Chest:     Chest wall: No tenderness.  Abdominal:     General: Bowel sounds are normal. There is no distension.     Palpations: Abdomen is soft. There is no mass.     Tenderness: There is no abdominal tenderness. There is no guarding or rebound.  Musculoskeletal:        General: No tenderness. Normal range of motion.     Cervical back: Normal range of motion.     Right lower leg: No edema.     Left lower leg: No edema.  Lymphadenopathy:     Cervical: No cervical adenopathy.  Skin:    General: Skin is warm and dry.     Findings: No rash.  Neurological:     Mental Status: He is alert and oriented to person, place, and time.     Cranial Nerves: No cranial nerve deficit.     Motor: No abnormal muscle tone.     Coordination: Coordination normal.     Gait: Gait normal.     Deep Tendon Reflexes: Reflexes are normal and symmetric.  Psychiatric:        Behavior: Behavior normal.        Thought Content: Thought content normal.        Judgment: Judgment normal.     Lab Results  Component Value Date   WBC 11.7 (H) 02/23/2023   HGB 12.3 (L) 02/23/2023   HCT 37.3 (L) 02/23/2023   PLT 264.0 02/23/2023   GLUCOSE 244 (H) 08/26/2023   CHOL 162 09/23/2022   TRIG 143.0 09/23/2022   HDL 25.90 (L) 09/23/2022   LDLCALC 107 (H) 09/23/2022   ALT 13 08/26/2023   AST 20 08/26/2023   NA 135 08/26/2023   K 4.0 08/26/2023   CL 99 08/26/2023   CREATININE 1.06 08/26/2023   BUN 12 08/26/2023   CO2 29 08/26/2023   TSH 3.35 02/23/2023   PSA 0.19 06/16/2008   INR 1.2 12/28/2022   HGBA1C 8.4 (H) 08/26/2023   MICROALBUR 1.03 06/06/2009    ECHOCARDIOGRAM COMPLETE Result Date: 12/19/2022    ECHOCARDIOGRAM REPORT   Patient Name:   Jeffrey [name] Date of Exam: 12/19/2022 Medical Rec #:   [id number]         Height:       73.0 in Accession #:    [id number]        Weight:       245.8 lb Date of Birth:  1965-05-18         BSA:          2.349 m  Patient Age:    57 years          BP:           117/55 mmHg Patient Gender: M                 HR:           80 bpm. Exam Location:  Inpatient Procedure: 2D Echo, Cardiac Doppler and Color Doppler Indications:    Shock  History:        Patient has no prior history of Echocardiogram examinations.                 Pacemaker.  Sonographer:    Jayson Gaskins Referring Phys: [phone] DEWARD ORN [facility]  Sonographer Comments: No apical window and no subcostal window. Technically difficult study due to patient position and body habitus. IMPRESSIONS  1. Left ventricular ejection fraction, by estimation, is 65 to 70%. The left ventricle has normal function. The left ventricle has no regional wall motion abnormalities. Left ventricular diastolic function could not be evaluated.  2. Right ventricular systolic function is normal. The right ventricular size is normal.  3. The mitral valve is normal in structure. No evidence of mitral valve regurgitation. No evidence of mitral stenosis.  4. The aortic valve is tricuspid. Aortic valve regurgitation is not visualized. No aortic stenosis is present.  5. The inferior vena cava is normal in size with greater than 50% respiratory variability, suggesting right atrial pressure of 3 mmHg. FINDINGS  Left Ventricle: Left ventricular ejection fraction, by estimation, is 65 to 70%. The left ventricle has normal function. The left ventricle has no regional wall motion abnormalities. The left ventricular internal cavity size was normal in size. There is  no left ventricular hypertrophy. Left ventricular diastolic function could not be evaluated. Right Ventricle: The right ventricular size is normal. No increase in right ventricular wall thickness. Right ventricular systolic function is normal. Left Atrium: Left atrial size was normal in size. Right  Atrium: Right atrial size was normal in size. Pericardium: There is no evidence of pericardial effusion. Mitral Valve: The mitral valve is normal in structure. No evidence of mitral valve regurgitation. No evidence of mitral valve stenosis. Tricuspid Valve: The tricuspid valve is normal in structure. Tricuspid valve regurgitation is trivial. No evidence of tricuspid stenosis. Aortic Valve: The aortic valve is tricuspid. Aortic valve regurgitation is not visualized. No aortic stenosis is present. Pulmonic Valve: The pulmonic valve was normal in structure. Pulmonic valve regurgitation is not visualized. No evidence of pulmonic stenosis. Aorta: The aortic root is normal in size and structure. Venous: The inferior vena cava is normal in size with greater than 50% respiratory variability, suggesting right atrial pressure of 3 mmHg. IAS/Shunts: No atrial level shunt detected by color flow Doppler.  LEFT VENTRICLE PLAX 2D LVIDd:         3.70 cm LVIDs:         2.10 cm LV PW:         1.20 cm LV IVS:        1.00 cm LVOT diam:     2.00 cm LVOT Area:     3.14 cm   AORTA Ao Root diam: 3.30 cm TRICUSPID VALVE TR Peak grad:   11.3 mmHg TR Vmax:        168.00 cm/s  SHUNTS Systemic Diam: 2.00 cm Annabella Scarce MD Electronically signed by Annabella Scarce MD Signature Date/Time: 12/19/2022/11:20:30 AM    Final    DG CHEST  PORT 1 VIEW Result Date: 12/18/2022 CLINICAL DATA:  Central line placement. EXAM: PORTABLE CHEST 1 VIEW COMPARISON:  Radiograph and CT earlier today FINDINGS: Significant patient rotation. New right internal jugular central venous catheter tip overlies the mid SVC. No pneumothorax. Stable heart size and mediastinal contours allowing for rotation. Development of vascular congestion. No focal airspace disease. No pleural fluid or pneumothorax. No acute osseous findings. Left-sided pacemaker remains in place. IMPRESSION: 1. New right internal jugular central venous catheter tip overlies the mid SVC. No  pneumothorax. 2. Development of vascular congestion. Electronically Signed   By: Andrea Gasman M.D.   On: 12/18/2022 17:35   CT CHEST ABDOMEN PELVIS W CONTRAST Result Date: 12/18/2022 CLINICAL DATA:  Sepsis. EXAM: CT CHEST, ABDOMEN, AND PELVIS WITH CONTRAST TECHNIQUE: Multidetector CT imaging of the chest, abdomen and pelvis was performed following the standard protocol during bolus administration of intravenous contrast. RADIATION DOSE REDUCTION: This exam was performed according to the departmental dose-optimization program which includes automated exposure control, adjustment of the mA and/or kV according to patient size and/or use of iterative reconstruction technique. CONTRAST:  100mL OMNIPAQUE  IOHEXOL  300 MG/ML  SOLN COMPARISON:  None Available. FINDINGS: CT CHEST FINDINGS Cardiovascular: Normal heart size. No pericardial effusion. There is a left chest wall dual lead pacer. Mediastinum/Nodes: No enlarged mediastinal, hilar, or axillary lymph nodes. Thyroid gland, trachea, and esophagus demonstrate no significant findings. Lungs/Pleura: No pleural fluid or pneumothorax. No airspace consolidation identified. Mosaic attenuation pattern is identified within both lower lung zones. Mild bilateral lower lobe bronchial wall thickening. Musculoskeletal: No chest wall mass or suspicious bone lesions identified. Bilateral gynecomastia. CT ABDOMEN PELVIS FINDINGS Hepatobiliary: No suspicious liver abnormality. Subjective hepatic steatosis. Gallbladder appears normal. There is no bile duct dilatation. Pancreas: Unremarkable. No pancreatic ductal dilatation or surrounding inflammatory changes. Spleen: Spleen measures 15.3 cm in cranial caudal dimension. No focal splenic lesion. Adrenals/Urinary Tract: Normal adrenal glands. Simple cyst arises off the upper pole of the left kidney measuring 1.2 cm. Cyst off the inferior pole of the left kidney measures 1 cm. No follow-up imaging recommended. No signs of  nephrolithiasis or hydronephrosis. Urinary bladder is unremarkable. Stomach/Bowel: Stomach is normal. The appendix is visualized and is within normal limits. No pathologic dilatation of the large or small bowel loops. Proximal transverse colon appears decompressed with mucosal enhancement and wall thickening without surrounding inflammatory fat stranding, image 134/8. Intramural fatty deposition is identified involving the ileum and ascending colon. Vascular/Lymphatic: Mild aortic atherosclerosis. Porta hepatic lymph node measures 1.2 cm, image 58/2. Previously 1 cm. Portocaval lymph node measures 1.1 cm, image 56/2. Previously 0.8 cm. No pelvic or inguinal adenopathy. Reproductive: Prostate is unremarkable. Other: No ascites or focal fluid collections. Laxity of the midline ventral abdominal wall identified. No signs of pneumoperitoneum. Musculoskeletal: No acute or suspicious osseous findings. L5-S1 degenerative disc disease. IMPRESSION: 1. No acute findings within the chest, abdomen or pelvis. 2. Mosaic attenuation pattern within both lower lung zones with mild bilateral lower lobe bronchial wall thickening. Findings are nonspecific but may be seen in the setting of small airways disease. 3. Proximal transverse colon appears decompressed with mucosal enhancement and wall thickening without surrounding inflammatory fat stranding. Findings are nonspecific and may be related to underdistention. Correlate for any clinical signs or symptoms of colitis. 4. Intramural fatty deposition is identified involving the ileum and ascending colon. This is a nonspecific finding but can be seen in the setting of chronic inflammatory bowel disease. 5. Hepatic steatosis. 6. Splenomegaly. 7. Mildly enlarged  porta hepatic and portocaval lymph nodes. These are nonspecific and may be reactive. 8.  Aortic Atherosclerosis (ICD10-I70.0). Electronically Signed   By: Waddell Calk M.D.   On: 12/18/2022 07:46   DG Chest Port 1 View Result  Date: 12/18/2022 CLINICAL DATA:  58 year old male with suspected meningococcal sepsis. Weakness. EXAM: PORTABLE CHEST 1 VIEW COMPARISON:  Chest radiographs 07/11/2017 and earlier. FINDINGS: Portable AP semi upright view at 0304 hours. Low lung volumes and mildly rotated to the left. Stable chronic left chest dual lead pacemaker. Stable cardiac size and mediastinal contours. Allowing for low lung volumes, Allowing for portable technique the lungs are clear. No pneumothorax, pulmonary edema, pleural effusion, or consolidation identified. No acute osseous abnormality identified. Paucity of bowel gas in the visible abdomen. IMPRESSION: Low lung volumes, otherwise no acute cardiopulmonary abnormality. Electronically Signed   By: VEAR Hurst M.D.   On: 12/18/2022 04:23    Assessment & Plan:   Problem List Items Addressed This Visit     HTN (hypertension)   On Tenormin         Hypokalemia   Check potassium      PTSD (post-traumatic stress disorder)   On clonazepam  to 1.5 mg twice daily as needed.  Potential benefits of a long term benzodiazepines  use as well as potential risks  and complications were explained to the patient and were aknowledged.      Type 2 diabetes mellitus with hyperglycemia (HCC)   Not taking Metformin  Obtain labs including hemoglobin A1c       Relevant Orders   Comprehensive metabolic panel with GFR   Hemoglobin A1c   Vitamin D  deficiency   Start vitamin D  prescription followed by daily 2000 units daily      Other Visit Diagnoses       Encounter for colorectal cancer screening using Cologuard test    -  Primary   Relevant Orders   Cologuard         Meds ordered this encounter  Medications   clonazePAM  (KLONOPIN ) 1 MG tablet    Sig: Take 1.5 tablets (1.5 mg total) by mouth 2 (two) times daily as needed for anxiety.    Dispense:  90 tablet    Refill:  2    Prescription Expired      Follow-up: No follow-ups on file.  Marolyn Noel, MD

## 2023-11-30 NOTE — Assessment & Plan Note (Signed)
 Not taking Metformin Obtain labs including hemoglobin A1c

## 2023-11-30 NOTE — Assessment & Plan Note (Signed)
Check potassium

## 2023-12-03 ENCOUNTER — Other Ambulatory Visit: Payer: Self-pay | Admitting: Internal Medicine

## 2023-12-07 ENCOUNTER — Ambulatory Visit: Payer: Self-pay | Admitting: Internal Medicine

## 2023-12-07 MED ORDER — REPAGLINIDE 2 MG PO TABS: 2.0000 mg | ORAL_TABLET | Freq: Three times a day (TID) | ORAL | 5 refills | Status: DC

## 2023-12-21 ENCOUNTER — Encounter: Payer: Self-pay | Admitting: Podiatry

## 2023-12-21 ENCOUNTER — Ambulatory Visit: Payer: MEDICAID

## 2023-12-21 ENCOUNTER — Ambulatory Visit: Payer: MEDICAID | Admitting: Podiatry

## 2023-12-21 DIAGNOSIS — L97512 Non-pressure chronic ulcer of other part of right foot with fat layer exposed: Secondary | ICD-10-CM | POA: Diagnosis not present

## 2023-12-21 DIAGNOSIS — L03115 Cellulitis of right lower limb: Secondary | ICD-10-CM

## 2023-12-21 MED ORDER — DOXYCYCLINE HYCLATE 100 MG PO TABS: 100.0000 mg | ORAL_TABLET | Freq: Two times a day (BID) | ORAL | 0 refills | Status: AC

## 2023-12-21 MED ORDER — SILVER SULFADIAZINE 1 % EX CREA: 1.0000 | TOPICAL_CREAM | Freq: Every day | CUTANEOUS | 0 refills | Status: AC

## 2023-12-21 NOTE — Progress Notes (Signed)
 Subjective: Chief Complaint  Patient presents with   Tinea Pedis    Rm12 Painful rash on right foot.]/ scabbing and burning for 6 months/ soaks and neosporin    58 year old male presents today for follow evaluation of a wound on the bottom of his right foot.  He has a wound on the bottom of his foot which causes occasional discomfort but he also gets pain to the lateral aspect when she which does cause discomfort as well.  This will bleed at times.  No pus.  Increasing swelling or redness.  No fevers or chills.  Objective: AAO x3, NAD DP/PT pulses palpable bilaterally, CRT less than 3 seconds On the right foot submetatarsal 1 is ulceration initially measuring approximately 0.7 x 0.4 x 0.2 cm.  After debridement the wound measures 0.8 x 0.5 x 0.3 cm.  There is also a superficial wound at the lateral aspect of the fifth metatarsal.  There is localized edema.  There is no drainage or pus.  No fluctuation or crepitation.  No malodor. No pain with calf compression, swelling, warmth, erythema            Assessment: Ulceration right foot   Plan: -All treatment options discussed with the patient including all alternatives, risks, complications.  -X-rays obtained reviewed.  Multiple views of the right foot between toes no definitive cortical changes suggest osteomyelitis.  No soft tissue emphysema. - Debridement performed.  See procedure note below.  Also cleaned the skin today.  Silvadene  was applied followed by dressing. - Doxycycline  - Given ongoing nature of the wounds have ordered an MRI of the right foot to rule out osteomyelitis -Recommend immobilization in surgical shoe which he already has at home. -Monitor for any clinical signs or symptoms of infection and directed to call the office immediately should any occur or go to the ER.  Procedure: Excisional Debridement of Wound Tool: Sharp #312 chisel blade/tissue nipper Type of Debridement: Sharp Excisional Frequency:  @periodically  until appropriately healed.  Dressing is to be changed daily/keeping the wound clean and dry Rationale: Removal of non-viable soft tissue from the wound to promote healing.  Anesthesia: none Pre-Debridement Wound Measurements: 0.7 cm x 0.4 cm x 0.2 cm  Post-Debridement Wound Measurements: 0.8 cm x 0.5 cm x 0.3 cm  Area devitalized tissue removed(nonviable tissue only): 0.8 cm x 0.5 cm.  Blood loss: Minimal (<50cc) Depth of Debridement: with fat layer exposed Description of tissue removed: Non-viable tissue Technique: The wound and the surrounding skin were prepped and draped in usual aseptic fashion.  Aseptic technique was maintained throughout the procedure.  Using #312 blade/tissue nipper sharp debridement of necrotic/nonviable tissue was performed until healthy bleeding wound bed was achieved.  No underlying bone or tendon was exposed during debridement.  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  Debridement removed 0.8 cm x 0.5 cm of the necrotic tissue and subcutaneous tissue and none amount of purulent drainage was not present. Presence/absence of tissue: Necrotic tissue/nonviable tissue present at the base of the wound.  Sharp debridement was performed to remove the necrotic tissue/nonviable tissue back to viable tissue.  No devitalized/nonviable tissue present postdebridement.  Wound appeared clean and clear of infection No material in the wound was present that was identified to be inhibiting healing. Dressing: Dry, sterile, compression dressing. Disposition: Patient tolerated procedure well. Patient to return in 1 week for  follow-up or as listed above.  Donnice JONELLE Fees DPM

## 2023-12-21 NOTE — Patient Instructions (Signed)
 Monitor for any signs/symptoms of infection. Call the office immediately if any occur or go directly to the emergency room. Call with any questions/concerns.

## 2023-12-31 ENCOUNTER — Other Ambulatory Visit: Payer: Self-pay

## 2023-12-31 DIAGNOSIS — E1165 Type 2 diabetes mellitus with hyperglycemia: Secondary | ICD-10-CM

## 2023-12-31 MED ORDER — REPAGLINIDE 2 MG PO TABS: 2.0000 mg | ORAL_TABLET | Freq: Three times a day (TID) | ORAL | 5 refills | Status: DC

## 2024-01-05 ENCOUNTER — Ambulatory Visit: Payer: MEDICAID | Admitting: Podiatry

## 2024-01-25 ENCOUNTER — Ambulatory Visit: Payer: MEDICAID | Admitting: Podiatry

## 2024-01-27 ENCOUNTER — Ambulatory Visit: Payer: Self-pay

## 2024-01-27 NOTE — Telephone Encounter (Signed)
 FYI Only or Action Required?: FYI only for provider: appointment scheduled on 11/7.  Patient was last seen in primary care on 11/30/2023 by Plotnikov, Karlynn GAILS, MD.  Called Nurse Triage reporting Fatigue.  Symptoms began ongoing problem, worse 4 days ago.  Symptoms are: gradually worsening.  Triage Disposition: See PCP When Office is Open (Within 3 Days)  Patient/caregiver understands and will follow disposition?: Yes      Copied from CRM [id number]. Topic: Clinical - Red Word Triage >> Jan 27, 2024 12:25 PM Lauren C wrote: Red Word that prompted transfer to Nurse Triage: A new swelling in throat he described as a knot, feeling very weak and no appetite. Has been going on 4 days now.      Reason for Disposition  [1] Fatigue (i.e., tires easily, decreased energy) AND [2] persists > 1 week  Answer Assessment - Initial Assessment Questions 1. DESCRIPTION: Describe how you are feeling.     Feels tired with mild weakness  2. SEVERITY: How bad is it?  Can you stand and walk?     Mild  3. ONSET: When did these symptoms begin? (e.g., hours, days, weeks, months)     Ongoing problem, worsened 4 days ago 4. CAUSE: What do you think is causing the weakness or fatigue? (e.g., not drinking enough fluids, medical problem, trouble sleeping)     Unsure 5. NEW MEDICINES:  Have you started on any new medicines recently? (e.g., opioid pain medicines, benzodiazepines, muscle relaxants, antidepressants, antihistamines, neuroleptics, beta blockers)     No 6. OTHER SYMPTOMS: Do you have any other symptoms? (e.g., chest pain, fever, cough, SOB, vomiting, diarrhea, bleeding, other areas of pain)     Swollen right sided neck lymph node  Protocols used: Weakness (Generalized) and Fatigue-A-AH

## 2024-01-29 ENCOUNTER — Encounter: Payer: Self-pay | Admitting: Internal Medicine

## 2024-01-29 ENCOUNTER — Ambulatory Visit: Payer: Self-pay | Admitting: Internal Medicine

## 2024-01-29 ENCOUNTER — Ambulatory Visit: Payer: MEDICAID

## 2024-01-29 ENCOUNTER — Ambulatory Visit: Payer: MEDICAID | Admitting: Internal Medicine

## 2024-01-29 VITALS — BP 140/80 | HR 64 | Temp 98.7°F | Ht 73.0 in | Wt 225.0 lb

## 2024-01-29 DIAGNOSIS — E559 Vitamin D deficiency, unspecified: Secondary | ICD-10-CM | POA: Diagnosis not present

## 2024-01-29 DIAGNOSIS — I10 Essential (primary) hypertension: Secondary | ICD-10-CM | POA: Diagnosis not present

## 2024-01-29 DIAGNOSIS — R221 Localized swelling, mass and lump, neck: Secondary | ICD-10-CM

## 2024-01-29 DIAGNOSIS — Z720 Tobacco use: Secondary | ICD-10-CM

## 2024-01-29 DIAGNOSIS — R634 Abnormal weight loss: Secondary | ICD-10-CM | POA: Diagnosis not present

## 2024-01-29 LAB — BASIC METABOLIC PANEL WITH GFR
BUN: 9 mg/dL (ref 6–23)
CO2: 30 meq/L (ref 19–32)
Calcium: 8.8 mg/dL (ref 8.4–10.5)
Chloride: 96 meq/L (ref 96–112)
Creatinine, Ser: 1.08 mg/dL (ref 0.40–1.50)
GFR: 74.48 mL/min (ref >60.00)
Glucose, Bld: 265 mg/dL — ABNORMAL HIGH (ref 70–99)
Potassium: 3.7 meq/L (ref 3.5–5.1)
Sodium: 137 meq/L (ref 135–145)

## 2024-01-29 LAB — HEPATIC FUNCTION PANEL
ALT: 10 U/L (ref 0–53)
AST: 14 U/L (ref 0–37)
Albumin: 4 g/dL (ref 3.5–5.2)
Alkaline Phosphatase: 96 U/L (ref 39–117)
Bilirubin, Direct: 0.1 mg/dL (ref 0.0–0.3)
Total Bilirubin: 0.6 mg/dL (ref 0.2–1.2)
Total Protein: 8 g/dL (ref 6.0–8.3)

## 2024-01-29 LAB — CBC WITH DIFFERENTIAL/PLATELET
Basophils Absolute: 0.1 K/uL (ref 0.0–0.1)
Basophils Relative: 0.5 % (ref 0.0–3.0)
Eosinophils Absolute: 0 K/uL (ref 0.0–0.7)
Eosinophils Relative: 0.4 % (ref 0.0–5.0)
HCT: 40.2 % (ref 39.0–52.0)
Hemoglobin: 13.6 g/dL (ref 13.0–17.0)
Lymphocytes Relative: 21.8 % (ref 12.0–46.0)
Lymphs Abs: 3 K/uL (ref 0.7–4.0)
MCHC: 33.9 g/dL (ref 30.0–36.0)
MCV: 82.2 fl (ref 78.0–100.0)
Monocytes Absolute: 0.7 K/uL (ref 0.1–1.0)
Monocytes Relative: 5 % (ref 3.0–12.0)
Neutro Abs: 9.9 K/uL — ABNORMAL HIGH (ref 1.4–7.7)
Neutrophils Relative %: 72.3 % (ref 43.0–77.0)
Platelets: 273 K/uL (ref 150.0–400.0)
RBC: 4.89 Mil/uL (ref 4.22–5.81)
RDW: 14 % (ref 11.5–15.5)
WBC: 13.7 K/uL — ABNORMAL HIGH (ref 4.0–10.5)

## 2024-01-29 NOTE — Assessment & Plan Note (Signed)
 BP Readings from Last 3 Encounters:  01/29/24 (!) 140/80  11/30/23 132/84  08/26/23 (!) 142/78   Mild uncontrolled, pt to continue medical treatment tenormin  50 mg every day, declines other change for now, to cont follow BP at home and next visit

## 2024-01-29 NOTE — Patient Instructions (Signed)
 Please stop the dipping tobacco  Please continue all other medications as before, and refills have been done if requested.  Please have the pharmacy call with any other refills you may need.  Please keep your appointments with your specialists as you may have planned  You will be contacted regarding the referral for: CT neck  Please go to the XRAY Department in the first floor for the x-ray testing  Please go to the LAB at the blood drawing area for the tests to be done  You will be contacted by phone if any changes need to be made immediately.  Otherwise, you will receive a letter about your results with an explanation, but please check with MyChart first.

## 2024-01-29 NOTE — Assessment & Plan Note (Signed)
 Pt states 80 lbs over the past year since episode of sepsis, pt to continue current meds, and continue to follow with other eval as above

## 2024-01-29 NOTE — Progress Notes (Signed)
 Patient ID: Jeffrey [name], male   DOB: 06-19-1965, 58 y.o.   MRN: [id number]        Chief Complaint: follow up neck mass, chews tobacco, weight loss unintentional, low vit d, htn       HPI:  Jeffrey [name] is a 58 y.o. male pt of Dr Garald overall doing ok after episode of sepsis last year with MSSA bacteremia; today with several concerning complaints, including a non tender firm mass it seems to the right mid neck, voice change now gravelly, and remarkable wt loss he states 80 lbs simply with low appetite since the episode of sepsis.  Has lack of energy and fatigue persistent.  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  Pt with chronic chew tobacco, not ready to quit.    Did have constipation several days last wk but seems better now.  Wt Readings from Last 3 Encounters:  01/29/24 225 lb (102.1 kg)  11/30/23 224 lb 12.8 oz (102 kg)  08/26/23 232 lb (105.2 kg)   BP Readings from Last 3 Encounters:  01/29/24 (!) 140/80  11/30/23 132/84  08/26/23 (!) 142/78         Past Medical History:  Diagnosis Date   Acute sinusitis 10/29/2016   8/18   Anxiety    Cerumen impaction 10/29/2016   2018   Long Q-T syndrome 12/19/2014   Pacemaker 1993 [facility] Celexa  low dose - no issues w/QT    Muscular fasciculation 03/09/2015   Face, lips, hands - chronic   Opioid dependence (HCC) 12/19/2014   x20 years On Methadone  since 1993 - Methadone  clinic    Pacemaker    2006 [employer], Identity XL [address], serial # [id number]. St Jude rep must physically interrogate pacemaker.    PTSD (post-traumatic stress disorder) 12/19/2014   On Klonopin , Celexa   Potential benefits of a long term benzodiazepines  use as well as potential risks  and complications were explained to the patient and were aknowledged.    Skin abscess 03/09/2015   11/16 - small    Substance abuse (HCC)    opioids   Tremor 07/17/2016   ?etiology    Vaccine counseling 01/27/2023   Past Surgical  History:  Procedure Laterality Date   HERNIA REPAIR Right    LEAD EXTRACTION N/A 12/29/2022   Procedure: LEAD EXTRACTION;  Surgeon: Cindie Ole DASEN, MD;  Location: [facility] INVASIVE CV LAB;  Service: Cardiovascular;  Laterality: N/A;   LEFT HEART CATH AND CORONARY ANGIOGRAPHY N/A 12/25/2022   Procedure: LEFT HEART CATH AND CORONARY ANGIOGRAPHY;  Surgeon: Verlin Lonni BIRCH, MD;  Location: [facility] INVASIVE CV LAB;  Service: Cardiovascular;  Laterality: N/A;   LIVER SURGERY     PPM GENERATOR CHANGEOUT N/A 08/14/2017   Procedure: PPM GENERATOR CHANGEOUT;  Surgeon: Fernande Elspeth BROCKS, MD;  Location: [facility] INVASIVE CV LAB;  Service: Cardiovascular;  Laterality: N/A;   TEE WITHOUT CARDIOVERSION N/A 12/23/2022   Procedure: TRANSESOPHAGEAL ECHOCARDIOGRAM;  Surgeon: Alvan Ronal BRAVO, MD;  Location: [facility] INVASIVE CV LAB;  Service: Cardiovascular;  Laterality: N/A;   TEE WITHOUT CARDIOVERSION N/A 12/29/2022   Procedure: TRANSESOPHAGEAL ECHOCARDIOGRAM;  Surgeon: Cindie Ole DASEN, MD;  Location: [facility] INVASIVE CV LAB;  Service: Cardiovascular;  Laterality: N/A;    reports that he has never smoked. His smokeless tobacco use includes chew. He reports that he does not drink alcohol and does not use drugs. family history includes Arthritis in his father and mother; Depression in  his mother; Diabetes in his mother. Allergies  Allergen Reactions   Codeine Itching   Penicillins Hives    Has patient had a PCN reaction causing immediate rash, facial/tongue/throat swelling, SOB or lightheadedness with hypotension: unknown Has patient had a PCN reaction causing severe rash involving mucus membranes or skin necrosis: unknown Has patient had a PCN reaction that required hospitalization unknown Has patient had a PCN reaction occurring within the last 10 years: No If all of the above answers are NO, then may proceed with Cephalosporin use.    Current Outpatient Medications on File Prior to Visit  Medication Sig Dispense Refill    ammonium lactate  (AMLACTIN) 12 % cream Apply 1 Application topically as needed for dry skin. (Patient not taking: Reported on 12/21/2023) 385 g 0   atenolol  (TENORMIN ) 50 MG tablet TAKE 1 TABLET (50 MG TOTAL) BY MOUTH DAILY. 30 tablet 11   Cholecalciferol (VITAMIN D3) 2000 UNITS capsule Take 1 capsule (2,000 Units total) by mouth daily. 100 capsule 3   citalopram  (CELEXA ) 20 MG tablet Take 1 tablet (20 mg total) by mouth daily. 90 tablet 1   clonazePAM  (KLONOPIN ) 1 MG tablet Take 1.5 tablets (1.5 mg total) by mouth 2 (two) times daily as needed for anxiety. 90 tablet 2   Continuous Blood Gluc Sensor (FREESTYLE LIBRE 3 SENSOR) MISC 1 Units by Does not apply route every 14 (fourteen) days. (Patient not taking: Reported on 12/21/2023) 6 each 3   doxycycline  (VIBRA -TABS) 100 MG tablet Take 1 tablet (100 mg total) by mouth 2 (two) times daily. 20 tablet 0   ibuprofen (ADVIL,MOTRIN) 200 MG tablet Take 400 mg by mouth every 6 (six) hours as needed for mild pain, moderate pain or headache.     metFORMIN  (GLUCOPHAGE ) 500 MG tablet Take 1 tablet (500 mg total) by mouth 2 (two) times daily with a meal. (Patient not taking: Reported on 12/21/2023) 180 tablet 3   mupirocin  ointment (BACTROBAN ) 2 % On leg wound w/dressing change qd or bid (Patient not taking: Reported on 12/21/2023) 30 g 0   potassium chloride  SA (KLOR-CON  M) 20 MEQ tablet TAKE 1 TABLET (20 MEQ TOTAL) BY MOUTH DAILY. 30 tablet 3   repaglinide  (PRANDIN ) 2 MG tablet Take 1 tablet (2 mg total) by mouth 3 (three) times daily before meals. 90 tablet 5   silver  sulfADIAZINE  (SILVADENE ) 1 % cream Apply 1 Application topically daily. 50 g 0   triamcinolone  cream (KENALOG ) 0.1 % Apply 1 application topically 3 (three) times daily. (Patient not taking: Reported on 12/21/2023) 45 g 1   valACYclovir  (VALTREX ) 1000 MG tablet Take 1 tablet (1,000 mg total) by mouth 3 (three) times daily. (Patient not taking: Reported on 12/21/2023) 21 tablet 0   Vitamin D ,  Ergocalciferol , (DRISDOL ) 1.25 MG (50000 UNIT) CAPS capsule Take 1 capsule (50,000 Units total) by mouth every 7 (seven) days. 8 capsule 0   No current facility-administered medications on file prior to visit.        ROS:  All others reviewed and negative.  Objective        PE:  BP (!) 140/80 (BP Location: Right Arm, Patient Position: Sitting, Cuff Size: Normal)   Pulse 64   Temp 98.7 F (37.1 C) (Oral)   Ht 6' 1 (1.854 m)   Wt 225 lb (102.1 kg)   SpO2 97%   BMI 29.69 kg/m                 Constitutional: Pt appears fatigued, overwt  HENT: Head: NCAT.                Right Ear: External ear normal.                 Left Ear: External ear normal.                Eyes: . Pupils are equal, round, and reactive to light. Conjunctivae and EOM are normal               Nose: without d/c or deformity; pharynx benign, no oral mass or under tongue lesions noted               Neck: Neck supple. Gross normal ROM,  anterior nontender mass over the right hyoid bone area with probable smaller masses multiple bilateral submandibular               Cardiovascular: Normal rate and regular rhythm.                 Pulmonary/Chest: Effort normal and breath sounds without rales or wheezing.                Abd:  Soft, NT, ND, + BS, no organomegaly               Neurological: Pt is alert. At baseline orientation, motor grossly intact               Skin: Skin is warm. No rashes, no other new lesions, LE edema - none               Psychiatric: Pt behavior is normal without agitation   Micro: none  Cardiac tracings I have personally interpreted today:  none  Pertinent Radiological findings (summarize): none   Lab Results  Component Value Date   WBC 13.7 (H) 01/29/2024   HGB 13.6 01/29/2024   HCT 40.2 01/29/2024   PLT 273.0 01/29/2024   GLUCOSE 265 (H) 01/29/2024   CHOL 162 09/23/2022   TRIG 143.0 09/23/2022   HDL 25.90 (L) 09/23/2022   LDLCALC 107 (H) 09/23/2022   ALT 10 01/29/2024    AST 14 01/29/2024   NA 137 01/29/2024   K 3.7 01/29/2024   CL 96 01/29/2024   CREATININE 1.08 01/29/2024   BUN 9 01/29/2024   CO2 30 01/29/2024   TSH 3.35 02/23/2023   PSA 0.19 06/16/2008   INR 1.2 12/28/2022   HGBA1C 13.3 (H) 11/30/2023   MICROALBUR 1.03 06/06/2009   Assessment/Plan:  Katsumi Wisler Savona is a 58 y.o. White or Caucasian [1] male with  has a past medical history of Acute sinusitis (10/29/2016), Anxiety, Cerumen impaction (10/29/2016), Long Q-T syndrome (12/19/2014), Muscular fasciculation (03/09/2015), Opioid dependence (HCC) (12/19/2014), Pacemaker, PTSD (post-traumatic stress disorder) (12/19/2014), Skin abscess (03/09/2015), Substance abuse (HCC), Tremor (07/17/2016), and Vaccine counseling (01/27/2023).  Neck mass Pt with anterior nontender firm non mobile mass over the right hyoid bone area with probable smaller masses multiple bilateral submandibular as well, suspicious for malignancy.  Today for cbc cmet, cxr, and refer for Neck CT w CM; may also need ENT referral pending results  Weight loss Pt states 80 lbs over the past year since episode of sepsis, pt to continue current meds, and continue to follow with other eval as above  Vitamin D  deficiency Last vitamin D  Lab Results  Component Value Date   VD25OH 16.13 (L) 09/23/2022   Low, to start oral replacement   HTN (hypertension) BP Readings from Last 3  Encounters:  01/29/24 (!) 140/80  11/30/23 132/84  08/26/23 (!) 142/78   Mild uncontrolled, pt to continue medical treatment tenormin  50 mg every day, declines other change for now, to cont follow BP at home and next visit   Tobacco use Pt counsled to stop chew tobacco use  Followup: Return if symptoms worsen or fail to improve.  Lynwood Rush, MD 01/29/2024 1:04 PM  [facility]

## 2024-01-29 NOTE — Assessment & Plan Note (Signed)
 Last vitamin D  Lab Results  Component Value Date   VD25OH 16.13 (L) 09/23/2022   Low, to start oral replacement

## 2024-01-29 NOTE — Assessment & Plan Note (Signed)
 Pt with anterior nontender firm non mobile mass over the right hyoid bone area with probable smaller masses multiple bilateral submandibular as well, suspicious for malignancy.  Today for cbc cmet, cxr, and refer for Neck CT w CM; may also need ENT referral pending results

## 2024-01-29 NOTE — Assessment & Plan Note (Signed)
 Pt counsled to stop chew tobacco use

## 2024-02-02 ENCOUNTER — Encounter: Payer: Self-pay | Admitting: Internal Medicine

## 2024-02-03 ENCOUNTER — Encounter: Payer: Self-pay | Admitting: Podiatry

## 2024-02-05 ENCOUNTER — Telehealth: Payer: Self-pay

## 2024-02-05 NOTE — Telephone Encounter (Signed)
 Copied from CRM #[id number]. Topic: Clinical - Medical Advice >> Feb 05, 2024 10:34 AM Jeffrey [name] wrote: Reason for CRM: Patient was seen on 01/29/2024 and states he has been losing a lot of weight, he would like to know if drinking ensure or any other of those meal supplements can help with making sure patient is getting enough vitamins and nutrients.

## 2024-02-08 NOTE — Telephone Encounter (Signed)
 Dorise can use Glucerna for supplement.  Schedule office visit to see me or one of my partners.  We need to establish the cause of his weight loss.  Thanks

## 2024-02-08 NOTE — Telephone Encounter (Signed)
 Called and spoke with Jeffrey [name] and Willard. Informed them of suggested supplement as well as advice for follow up. At this time Jeffrey [name] has an appointment with GI for a CT scan on Wednesday to address some of the issues.  He also has a follow up with us  for 12/08, would you want him to come in sooner?

## 2024-02-09 NOTE — Telephone Encounter (Signed)
 02/29/2024 appointment should be okay.  Thank you

## 2024-02-10 ENCOUNTER — Inpatient Hospital Stay
Admission: RE | Admit: 2024-02-10 | Discharge: 2024-02-10 | Disposition: A | Payer: MEDICAID | Source: Ambulatory Visit | Attending: Internal Medicine | Admitting: Internal Medicine

## 2024-02-10 DIAGNOSIS — R221 Localized swelling, mass and lump, neck: Secondary | ICD-10-CM

## 2024-02-10 MED ORDER — IOPAMIDOL (ISOVUE-300) INJECTION 61%
75.0000 mL | Freq: Once | INTRAVENOUS | Status: AC | PRN
  Administered 2024-02-10: 75 mL via INTRAVENOUS

## 2024-02-29 ENCOUNTER — Encounter: Payer: Self-pay | Admitting: Internal Medicine

## 2024-02-29 ENCOUNTER — Ambulatory Visit: Payer: MEDICAID | Admitting: Internal Medicine

## 2024-02-29 VITALS — BP 134/80 | HR 91 | Temp 98.8°F | Ht 73.0 in

## 2024-02-29 DIAGNOSIS — E1165 Type 2 diabetes mellitus with hyperglycemia: Secondary | ICD-10-CM

## 2024-02-29 DIAGNOSIS — Z7984 Long term (current) use of oral hypoglycemic drugs: Secondary | ICD-10-CM

## 2024-02-29 DIAGNOSIS — F4310 Post-traumatic stress disorder, unspecified: Secondary | ICD-10-CM | POA: Diagnosis not present

## 2024-02-29 DIAGNOSIS — R221 Localized swelling, mass and lump, neck: Secondary | ICD-10-CM | POA: Diagnosis not present

## 2024-02-29 MED ORDER — CLONAZEPAM 1 MG PO TABS: 1.5000 mg | ORAL_TABLET | Freq: Two times a day (BID) | ORAL | 2 refills | Status: DC | PRN

## 2024-02-29 MED ORDER — REPAGLINIDE 2 MG PO TABS: 2.0000 mg | ORAL_TABLET | Freq: Three times a day (TID) | ORAL | 5 refills | Status: AC

## 2024-02-29 MED ORDER — METFORMIN HCL 500 MG PO TABS: 500.0000 mg | ORAL_TABLET | Freq: Two times a day (BID) | ORAL | 3 refills | Status: AC

## 2024-02-29 NOTE — Assessment & Plan Note (Addendum)
 Jeffrey [name] is not taking meds (Repaglinide  and Metformin ). He was asked to start meds Risks associated with treatment noncompliance were discussed. Compliance was encouraged.

## 2024-02-29 NOTE — Progress Notes (Signed)
 Subjective:  Patient ID: Jeffrey [name], male    DOB: [birth year]  Age: 58 y.o. MRN: [id number]  CC: Medical Management of Chronic Issues (3 Month follow up)   HPI Jeffrey [name] presents for a non tender firm mass it seems to the right mid neck, voice change now gravelly f/u. Neck CT was (-)....  F/u on anxiety F/u on DM2 - not taking meds (Repaglinide  and Metformin )  Outpatient Medications Prior to Visit  Medication Sig Dispense Refill   atenolol  (TENORMIN ) 50 MG tablet TAKE 1 TABLET (50 MG TOTAL) BY MOUTH DAILY. 30 tablet 11   Cholecalciferol (VITAMIN D3) 2000 UNITS capsule Take 1 capsule (2,000 Units total) by mouth daily. 100 capsule 3   citalopram  (CELEXA ) 20 MG tablet Take 1 tablet (20 mg total) by mouth daily. 90 tablet 1   doxycycline  (VIBRA -TABS) 100 MG tablet Take 1 tablet (100 mg total) by mouth 2 (two) times daily. 20 tablet 0   ibuprofen (ADVIL,MOTRIN) 200 MG tablet Take 400 mg by mouth every 6 (six) hours as needed for mild pain, moderate pain or headache.     potassium chloride  SA (KLOR-CON  M) 20 MEQ tablet TAKE 1 TABLET (20 MEQ TOTAL) BY MOUTH DAILY. 30 tablet 3   silver  sulfADIAZINE  (SILVADENE ) 1 % cream Apply 1 Application topically daily. 50 g 0   Vitamin D , Ergocalciferol , (DRISDOL ) 1.25 MG (50000 UNIT) CAPS capsule Take 1 capsule (50,000 Units total) by mouth every 7 (seven) days. 8 capsule 0   clonazePAM  (KLONOPIN ) 1 MG tablet Take 1.5 tablets (1.5 mg total) by mouth 2 (two) times daily as needed for anxiety. 90 tablet 2   repaglinide  (PRANDIN ) 2 MG tablet Take 1 tablet (2 mg total) by mouth 3 (three) times daily before meals. 90 tablet 5   ammonium lactate  (AMLACTIN) 12 % cream Apply 1 Application topically as needed for dry skin. (Patient not taking: Reported on 02/29/2024) 385 g 0   Continuous Blood Gluc Sensor (FREESTYLE LIBRE 3 SENSOR) MISC 1 Units by Does not apply route every 14 (fourteen) days. (Patient not taking: Reported on 02/29/2024) 6 each 3    mupirocin  ointment (BACTROBAN ) 2 % On leg wound w/dressing change qd or bid (Patient not taking: Reported on 02/29/2024) 30 g 0   triamcinolone  cream (KENALOG ) 0.1 % Apply 1 application topically 3 (three) times daily. (Patient not taking: Reported on 02/29/2024) 45 g 1   valACYclovir  (VALTREX ) 1000 MG tablet Take 1 tablet (1,000 mg total) by mouth 3 (three) times daily. (Patient not taking: Reported on 02/29/2024) 21 tablet 0   metFORMIN  (GLUCOPHAGE ) 500 MG tablet Take 1 tablet (500 mg total) by mouth 2 (two) times daily with a meal. (Patient not taking: Reported on 02/29/2024) 180 tablet 3   No facility-administered medications prior to visit.    ROS: Review of Systems  Constitutional:  Positive for fatigue. Negative for appetite change and unexpected weight change.  HENT:  Negative for congestion, nosebleeds, sneezing, sore throat and trouble swallowing.   Eyes:  Negative for itching and visual disturbance.  Respiratory:  Negative for cough.   Cardiovascular:  Negative for chest pain, palpitations and leg swelling.  Gastrointestinal:  Negative for abdominal distention, blood in stool, diarrhea and nausea.  Genitourinary:  Negative for frequency and hematuria.  Musculoskeletal:  Positive for back pain. Negative for gait problem, joint swelling and neck pain.  Skin:  Negative for rash.  Neurological:  Negative for dizziness, tremors, speech difficulty and weakness.  Psychiatric/Behavioral:  Negative  for agitation, dysphoric mood and sleep disturbance. The patient is not nervous/anxious.     Objective:  BP 134/80   Pulse 91   Temp 98.8 F (37.1 C)   Ht 6' 1 (1.854 m)   SpO2 98%   BMI 29.69 kg/m   BP Readings from Last 3 Encounters:  02/29/24 134/80  01/29/24 (!) 140/80  11/30/23 132/84    Wt Readings from Last 3 Encounters:  01/29/24 225 lb (102.1 kg)  11/30/23 224 lb 12.8 oz (102 kg)  08/26/23 232 lb (105.2 kg)    Physical Exam Constitutional:      General: He is not in  acute distress.    Appearance: He is well-developed. He is obese.     Comments: NAD  Eyes:     Conjunctiva/sclera: Conjunctivae normal.     Pupils: Pupils are equal, round, and reactive to light.  Neck:     Thyroid: No thyromegaly.     Vascular: No JVD.  Cardiovascular:     Rate and Rhythm: Normal rate and regular rhythm.     Heart sounds: Normal heart sounds. No murmur heard.    No friction rub. No gallop.  Pulmonary:     Effort: Pulmonary effort is normal. No respiratory distress.     Breath sounds: Normal breath sounds. No wheezing or rales.  Chest:     Chest wall: No tenderness.  Abdominal:     General: Bowel sounds are normal. There is no distension.     Palpations: Abdomen is soft. There is no mass.     Tenderness: There is no abdominal tenderness. There is no guarding or rebound.  Musculoskeletal:        General: No tenderness. Normal range of motion.     Cervical back: Normal range of motion.  Lymphadenopathy:     Cervical: No cervical adenopathy.  Skin:    General: Skin is warm and dry.     Findings: No rash.  Neurological:     Mental Status: He is alert and oriented to person, place, and time.     Cranial Nerves: No cranial nerve deficit.     Motor: No abnormal muscle tone.     Coordination: Coordination normal.     Gait: Gait normal.     Deep Tendon Reflexes: Reflexes are normal and symmetric.  Psychiatric:        Behavior: Behavior normal.        Thought Content: Thought content normal.        Judgment: Judgment normal.   obese  Lab Results  Component Value Date   WBC 13.7 (H) 01/29/2024   HGB 13.6 01/29/2024   HCT 40.2 01/29/2024   PLT 273.0 01/29/2024   GLUCOSE 265 (H) 01/29/2024   CHOL 162 09/23/2022   TRIG 143.0 09/23/2022   HDL 25.90 (L) 09/23/2022   LDLCALC 107 (H) 09/23/2022   ALT 10 01/29/2024   AST 14 01/29/2024   NA 137 01/29/2024   K 3.7 01/29/2024   CL 96 01/29/2024   CREATININE 1.08 01/29/2024   BUN 9 01/29/2024   CO2 30  01/29/2024   TSH 3.35 02/23/2023   PSA 0.19 06/16/2008   INR 1.2 12/28/2022   HGBA1C 13.3 (H) 11/30/2023   MICROALBUR 1.03 06/06/2009    CT Soft Tissue Neck W Contrast Result Date: 02/13/2024 EXAM: CT NECK WITH CONTRAST 02/10/2024 09:29:26 AM TECHNIQUE: CT of the neck was performed with the administration of intravenous contrast. Multiplanar reformatted images are provided for review. Automated exposure  control, iterative reconstruction, and/or weight based adjustment of the mA/kV was utilized to reduce the radiation dose to as low as reasonably achievable. CONTRAST: 75 mL of Isovue  300. COMPARISON: None available. CLINICAL HISTORY: Right sided neck mass x 2 weeks, difficulties in swallowing. FINDINGS: AERODIGESTIVE TRACT: No discrete mass. No edema. SALIVARY GLANDS: The parotid and submandibular glands are unremarkable. THYROID: Unremarkable. LYMPH NODES: No suspicious cervical lymphadenopathy. SOFT TISSUES: No mass or fluid collection. BRAIN, ORBITS, SINUSES AND MASTOIDS: No acute abnormality. LUNGS AND MEDIASTINUM: No acute abnormality. BONES: No focal bone abnormality. IMPRESSION: 1. No discrete neck mass is identified to account for the reported right-sided neck mass. Electronically signed by: Franky Stanford MD 02/13/2024 08:44 PM EST RP Workstation: [id number]    Assessment & Plan:   Problem List Items Addressed This Visit     Neck mass - Primary   Neck CT was (-)....      PTSD (post-traumatic stress disorder)   On clonazepam  to 1.5 mg twice daily as needed.  Potential benefits of a long term benzodiazepines  use as well as potential risks  and complications were explained to the patient and were aknowledged. On Lexapro      Type 2 diabetes mellitus with hyperglycemia (HCC)   Dorise is not taking meds (Repaglinide  and Metformin ). He was asked to start meds Risks associated with treatment noncompliance were discussed. Compliance was encouraged.       Relevant Medications    metFORMIN  (GLUCOPHAGE ) 500 MG tablet   repaglinide  (PRANDIN ) 2 MG tablet      Meds ordered this encounter  Medications   metFORMIN  (GLUCOPHAGE ) 500 MG tablet    Sig: Take 1 tablet (500 mg total) by mouth 2 (two) times daily with a meal.    Dispense:  180 tablet    Refill:  3   repaglinide  (PRANDIN ) 2 MG tablet    Sig: Take 1 tablet (2 mg total) by mouth 3 (three) times daily before meals.    Dispense:  90 tablet    Refill:  5   clonazePAM  (KLONOPIN ) 1 MG tablet    Sig: Take 1.5 tablets (1.5 mg total) by mouth 2 (two) times daily as needed for anxiety.    Dispense:  90 tablet    Refill:  2    Prescription Expired      Follow-up: Return in about 3 months (around 05/29/2024) for a follow-up visit.  Marolyn Noel, MD

## 2024-02-29 NOTE — Assessment & Plan Note (Signed)
 On clonazepam  to 1.5 mg twice daily as needed.  Potential benefits of a long term benzodiazepines  use as well as potential risks  and complications were explained to the patient and were aknowledged. On Lexapro

## 2024-02-29 NOTE — Assessment & Plan Note (Signed)
 Neck CT was (-)...SABRA

## 2024-04-05 ENCOUNTER — Other Ambulatory Visit: Payer: Self-pay | Admitting: Internal Medicine

## 2024-04-05 NOTE — Telephone Encounter (Signed)
 Patient shipment got lost with medications from [facility] and would like a [facility] supply [facility] in the meantime until medications arrive. Father Reeves would like a call from [place] with an update  Please advise

## 2024-04-05 NOTE — Telephone Encounter (Signed)
 Copied from CRM [id number]. Topic: Clinical - Medication Question >> Apr 05, 2024  3:15 PM Pinkey ORN wrote: Reason for CRM: Medication Question >> Apr 05, 2024  3:19 PM Pinkey ORN wrote: Reeves Silver [employer]is on behalf of patient, states he was calling wanting to speak back with Scruggs-Hernandez, Hadassah SAUNDERS, CMA in regards to filling the patient's prescription.   It's been documented that the situation has been handed over to Dr. Garald since Hadassah isn't able to fill the prescription due to it being a controlled substance.   Reeves states the patient hasn't had his medication since Sunday.

## 2024-04-05 NOTE — Telephone Encounter (Unsigned)
 Copied from CRM [id number]. Topic: Clinical - Medication Refill >> Apr 05, 2024 11:34 AM Viola F wrote: Medication: clonazePAM  (KLONOPIN ) 1 MG tablet [[id number]] atenolol  (TENORMIN ) 50 MG tablet [[id number]]   Has the patient contacted their pharmacy? Yes (Agent: If no, request that the patient contact the pharmacy for the refill. If patient does not wish to contact the pharmacy document the reason why and proceed with request.) (Agent: If yes, when and what did the pharmacy advise?)  This is the patient's preferred pharmacy:   [id number] [address], [place] - [address] [place] [address] Phone: [phone] Fax: [phone]  Is this the correct pharmacy for this prescription? Yes If no, delete pharmacy and type the correct one.   Has the prescription been filled recently? Yes  Is the patient out of the medication? Yes  Has the patient been seen for an appointment in the last year OR does the patient have an upcoming appointment? Yes  Can we respond through MyChart? Yes  Agent: Please be advised that Rx refills may take up to 3 business days. We ask that you follow-up with your pharmacy.

## 2024-04-06 MED ORDER — CLONAZEPAM 1 MG PO TABS: 1.5000 mg | ORAL_TABLET | Freq: Two times a day (BID) | ORAL | 2 refills | Status: AC | PRN

## 2024-04-06 MED ORDER — ATENOLOL 50 MG PO TABS: 50.0000 mg | ORAL_TABLET | Freq: Every day | ORAL | 3 refills | Status: AC

## 2024-05-26 ENCOUNTER — Ambulatory Visit: Payer: MEDICAID | Admitting: Internal Medicine
